# Patient Record
Sex: Male | Born: 1955 | Race: White | Hispanic: No | Smoking: Former smoker
Health system: Southern US, Community
[De-identification: ages and names within clinical notes are randomized; demographics above are authoritative.]

## PROBLEM LIST (undated history)

## (undated) DIAGNOSIS — I10 Essential (primary) hypertension: Secondary | ICD-10-CM

## (undated) DIAGNOSIS — R161 Splenomegaly, not elsewhere classified: Secondary | ICD-10-CM

## (undated) DIAGNOSIS — Z7969 Long term (current) use of other immunomodulators and immunosuppressants: Secondary | ICD-10-CM

## (undated) DIAGNOSIS — C7931 Secondary malignant neoplasm of brain: Principal | ICD-10-CM

## (undated) DIAGNOSIS — F1021 Alcohol dependence, in remission: Secondary | ICD-10-CM

## (undated) DIAGNOSIS — E785 Hyperlipidemia, unspecified: Secondary | ICD-10-CM

## (undated) DIAGNOSIS — F17201 Nicotine dependence, unspecified, in remission: Secondary | ICD-10-CM

## (undated) DIAGNOSIS — I2510 Atherosclerotic heart disease of native coronary artery without angina pectoris: Secondary | ICD-10-CM

## (undated) DIAGNOSIS — Z79899 Other long term (current) drug therapy: Secondary | ICD-10-CM

## (undated) DIAGNOSIS — C20 Malignant neoplasm of rectum: Secondary | ICD-10-CM

## (undated) DIAGNOSIS — Z9221 Personal history of antineoplastic chemotherapy: Secondary | ICD-10-CM

## (undated) DIAGNOSIS — Z923 Personal history of irradiation: Secondary | ICD-10-CM

## (undated) HISTORY — DX: Nicotine dependence, unspecified, in remission: F17.201

## (undated) HISTORY — PX: COLOSTOMY: SHX63

## (undated) HISTORY — DX: Hyperlipidemia, unspecified: E78.5

## (undated) HISTORY — DX: Atherosclerotic heart disease of native coronary artery without angina pectoris: I25.10

## (undated) HISTORY — PX: IRRIGATION AND DEBRIDEMENT SEBACEOUS CYST: SHX5255

## (undated) HISTORY — DX: Malignant neoplasm of rectum: C20

## (undated) HISTORY — DX: Alcohol dependence, in remission: F10.21

## (undated) HISTORY — DX: Secondary malignant neoplasm of brain: C79.31

## (undated) HISTORY — DX: Splenomegaly, not elsewhere classified: R16.1

---

## 2006-03-22 DIAGNOSIS — C20 Malignant neoplasm of rectum: Secondary | ICD-10-CM

## 2006-03-22 HISTORY — DX: Malignant neoplasm of rectum: C20

## 2006-03-22 HISTORY — PX: ABDOMINOPERINEAL PROCTOCOLECTOMY: SUR8

## 2006-04-25 ENCOUNTER — Ambulatory Visit: Payer: Self-pay | Admitting: Internal Medicine

## 2006-04-28 ENCOUNTER — Ambulatory Visit (HOSPITAL_COMMUNITY): Admission: RE | Admit: 2006-04-28 | Discharge: 2006-04-28 | Payer: Self-pay | Admitting: Internal Medicine

## 2006-04-28 ENCOUNTER — Ambulatory Visit: Payer: Self-pay | Admitting: Internal Medicine

## 2006-04-28 ENCOUNTER — Encounter (INDEPENDENT_AMBULATORY_CARE_PROVIDER_SITE_OTHER): Payer: Self-pay | Admitting: Specialist

## 2006-04-29 ENCOUNTER — Ambulatory Visit (HOSPITAL_COMMUNITY): Admission: RE | Admit: 2006-04-29 | Discharge: 2006-04-29 | Payer: Self-pay | Admitting: Internal Medicine

## 2006-05-09 ENCOUNTER — Inpatient Hospital Stay (HOSPITAL_COMMUNITY): Admission: RE | Admit: 2006-05-09 | Discharge: 2006-05-17 | Payer: Self-pay | Admitting: General Surgery

## 2006-05-09 ENCOUNTER — Encounter (INDEPENDENT_AMBULATORY_CARE_PROVIDER_SITE_OTHER): Payer: Self-pay | Admitting: Specialist

## 2006-06-06 ENCOUNTER — Encounter (HOSPITAL_COMMUNITY): Admission: RE | Admit: 2006-06-06 | Discharge: 2006-07-06 | Payer: Self-pay | Admitting: Oncology

## 2006-06-06 ENCOUNTER — Ambulatory Visit (HOSPITAL_COMMUNITY): Payer: Self-pay | Admitting: Oncology

## 2006-06-10 ENCOUNTER — Ambulatory Visit (HOSPITAL_COMMUNITY): Admission: RE | Admit: 2006-06-10 | Discharge: 2006-06-10 | Payer: Self-pay | Admitting: General Surgery

## 2006-07-10 ENCOUNTER — Emergency Department (HOSPITAL_COMMUNITY): Admission: EM | Admit: 2006-07-10 | Discharge: 2006-07-10 | Payer: Self-pay | Admitting: Emergency Medicine

## 2006-07-15 ENCOUNTER — Ambulatory Visit: Admission: RE | Admit: 2006-07-15 | Discharge: 2006-10-13 | Payer: Self-pay | Admitting: *Deleted

## 2006-07-20 ENCOUNTER — Encounter (HOSPITAL_COMMUNITY): Admission: RE | Admit: 2006-07-20 | Discharge: 2006-08-19 | Payer: Self-pay | Admitting: Oncology

## 2006-07-27 ENCOUNTER — Ambulatory Visit (HOSPITAL_COMMUNITY): Payer: Self-pay | Admitting: Oncology

## 2006-08-17 ENCOUNTER — Ambulatory Visit (HOSPITAL_COMMUNITY): Admission: RE | Admit: 2006-08-17 | Discharge: 2006-08-17 | Payer: Self-pay | Admitting: General Surgery

## 2006-08-23 ENCOUNTER — Encounter (HOSPITAL_COMMUNITY): Admission: RE | Admit: 2006-08-23 | Discharge: 2006-09-22 | Payer: Self-pay | Admitting: Oncology

## 2006-09-15 ENCOUNTER — Ambulatory Visit (HOSPITAL_COMMUNITY): Payer: Self-pay | Admitting: Oncology

## 2006-09-28 ENCOUNTER — Encounter (HOSPITAL_COMMUNITY): Admission: RE | Admit: 2006-09-28 | Discharge: 2006-10-28 | Payer: Self-pay | Admitting: Oncology

## 2006-10-25 ENCOUNTER — Ambulatory Visit: Admission: RE | Admit: 2006-10-25 | Discharge: 2006-12-16 | Payer: Self-pay | Admitting: *Deleted

## 2006-11-01 ENCOUNTER — Ambulatory Visit (HOSPITAL_COMMUNITY): Payer: Self-pay | Admitting: Oncology

## 2006-11-01 ENCOUNTER — Encounter (HOSPITAL_COMMUNITY): Admission: RE | Admit: 2006-11-01 | Discharge: 2006-12-01 | Payer: Self-pay | Admitting: Oncology

## 2006-12-07 ENCOUNTER — Encounter (HOSPITAL_COMMUNITY): Admission: RE | Admit: 2006-12-07 | Discharge: 2006-12-20 | Payer: Self-pay | Admitting: Oncology

## 2006-12-27 ENCOUNTER — Ambulatory Visit (HOSPITAL_COMMUNITY): Payer: Self-pay | Admitting: Oncology

## 2006-12-27 ENCOUNTER — Encounter (HOSPITAL_COMMUNITY): Admission: RE | Admit: 2006-12-27 | Discharge: 2007-01-26 | Payer: Self-pay | Admitting: Oncology

## 2007-02-27 ENCOUNTER — Ambulatory Visit (HOSPITAL_COMMUNITY): Payer: Self-pay | Admitting: Oncology

## 2007-03-23 HISTORY — PX: LAPAROSCOPIC LYSIS INTESTINAL ADHESIONS: SUR778

## 2007-03-30 ENCOUNTER — Encounter (HOSPITAL_COMMUNITY): Admission: RE | Admit: 2007-03-30 | Discharge: 2007-04-29 | Payer: Self-pay | Admitting: Oncology

## 2007-04-29 ENCOUNTER — Inpatient Hospital Stay (HOSPITAL_COMMUNITY): Admission: EM | Admit: 2007-04-29 | Discharge: 2007-05-24 | Payer: Self-pay | Admitting: Emergency Medicine

## 2007-05-08 ENCOUNTER — Encounter (INDEPENDENT_AMBULATORY_CARE_PROVIDER_SITE_OTHER): Payer: Self-pay | Admitting: General Surgery

## 2007-05-18 ENCOUNTER — Encounter: Payer: Self-pay | Admitting: General Surgery

## 2007-07-05 ENCOUNTER — Ambulatory Visit (HOSPITAL_COMMUNITY): Payer: Self-pay | Admitting: Oncology

## 2007-07-05 ENCOUNTER — Encounter (HOSPITAL_COMMUNITY): Admission: RE | Admit: 2007-07-05 | Discharge: 2007-08-04 | Payer: Self-pay | Admitting: Oncology

## 2007-07-27 ENCOUNTER — Ambulatory Visit (HOSPITAL_COMMUNITY): Admission: RE | Admit: 2007-07-27 | Discharge: 2007-07-27 | Payer: Self-pay | Admitting: Oncology

## 2007-09-27 ENCOUNTER — Encounter (HOSPITAL_COMMUNITY): Admission: RE | Admit: 2007-09-27 | Discharge: 2007-10-27 | Payer: Self-pay | Admitting: Oncology

## 2007-09-27 ENCOUNTER — Ambulatory Visit (HOSPITAL_COMMUNITY): Payer: Self-pay | Admitting: Oncology

## 2007-11-21 ENCOUNTER — Encounter (HOSPITAL_COMMUNITY): Admission: RE | Admit: 2007-11-21 | Discharge: 2007-12-18 | Payer: Self-pay | Admitting: Oncology

## 2007-12-11 ENCOUNTER — Ambulatory Visit (HOSPITAL_COMMUNITY): Payer: Self-pay | Admitting: Oncology

## 2008-03-04 ENCOUNTER — Encounter (HOSPITAL_COMMUNITY): Admission: RE | Admit: 2008-03-04 | Discharge: 2008-04-03 | Payer: Self-pay | Admitting: Oncology

## 2008-03-04 ENCOUNTER — Ambulatory Visit (HOSPITAL_COMMUNITY): Payer: Self-pay | Admitting: Oncology

## 2008-05-27 ENCOUNTER — Encounter (HOSPITAL_COMMUNITY): Admission: RE | Admit: 2008-05-27 | Discharge: 2008-06-26 | Payer: Self-pay | Admitting: Oncology

## 2008-05-27 ENCOUNTER — Ambulatory Visit (HOSPITAL_COMMUNITY): Payer: Self-pay | Admitting: Oncology

## 2008-06-19 ENCOUNTER — Ambulatory Visit (HOSPITAL_COMMUNITY): Admission: RE | Admit: 2008-06-19 | Discharge: 2008-06-19 | Payer: Self-pay | Admitting: General Surgery

## 2008-06-27 ENCOUNTER — Encounter: Payer: Self-pay | Admitting: Internal Medicine

## 2008-08-20 ENCOUNTER — Ambulatory Visit (HOSPITAL_COMMUNITY): Payer: Self-pay | Admitting: Oncology

## 2008-08-20 ENCOUNTER — Encounter (HOSPITAL_COMMUNITY): Admission: RE | Admit: 2008-08-20 | Discharge: 2008-09-19 | Payer: Self-pay | Admitting: Oncology

## 2008-11-12 ENCOUNTER — Encounter (HOSPITAL_COMMUNITY): Admission: RE | Admit: 2008-11-12 | Discharge: 2008-12-12 | Payer: Self-pay | Admitting: Oncology

## 2008-11-12 ENCOUNTER — Ambulatory Visit (HOSPITAL_COMMUNITY): Payer: Self-pay | Admitting: Oncology

## 2008-12-11 ENCOUNTER — Encounter (INDEPENDENT_AMBULATORY_CARE_PROVIDER_SITE_OTHER): Payer: Self-pay | Admitting: *Deleted

## 2009-01-22 DIAGNOSIS — I10 Essential (primary) hypertension: Secondary | ICD-10-CM | POA: Insufficient documentation

## 2009-01-23 ENCOUNTER — Ambulatory Visit: Payer: Self-pay | Admitting: Internal Medicine

## 2009-01-23 DIAGNOSIS — R161 Splenomegaly, not elsewhere classified: Secondary | ICD-10-CM

## 2009-01-23 DIAGNOSIS — F1021 Alcohol dependence, in remission: Secondary | ICD-10-CM

## 2009-01-23 DIAGNOSIS — J4489 Other specified chronic obstructive pulmonary disease: Secondary | ICD-10-CM | POA: Insufficient documentation

## 2009-01-23 DIAGNOSIS — J449 Chronic obstructive pulmonary disease, unspecified: Secondary | ICD-10-CM

## 2009-01-23 DIAGNOSIS — Z8711 Personal history of peptic ulcer disease: Secondary | ICD-10-CM

## 2009-01-23 DIAGNOSIS — Z872 Personal history of diseases of the skin and subcutaneous tissue: Secondary | ICD-10-CM | POA: Insufficient documentation

## 2009-01-23 DIAGNOSIS — Z8719 Personal history of other diseases of the digestive system: Secondary | ICD-10-CM

## 2009-01-23 HISTORY — DX: Alcohol dependence, in remission: F10.21

## 2009-01-23 HISTORY — DX: Splenomegaly, not elsewhere classified: R16.1

## 2009-01-31 ENCOUNTER — Encounter: Payer: Self-pay | Admitting: Internal Medicine

## 2009-02-04 ENCOUNTER — Ambulatory Visit (HOSPITAL_COMMUNITY): Payer: Self-pay | Admitting: Oncology

## 2009-02-04 ENCOUNTER — Encounter (HOSPITAL_COMMUNITY): Admission: RE | Admit: 2009-02-04 | Discharge: 2009-03-06 | Payer: Self-pay | Admitting: Oncology

## 2009-02-11 ENCOUNTER — Ambulatory Visit (HOSPITAL_COMMUNITY): Admission: RE | Admit: 2009-02-11 | Discharge: 2009-02-11 | Payer: Self-pay | Admitting: Internal Medicine

## 2009-02-11 ENCOUNTER — Ambulatory Visit: Payer: Self-pay | Admitting: Internal Medicine

## 2009-02-18 ENCOUNTER — Encounter: Payer: Self-pay | Admitting: Internal Medicine

## 2009-03-22 DIAGNOSIS — I2510 Atherosclerotic heart disease of native coronary artery without angina pectoris: Secondary | ICD-10-CM

## 2009-03-22 HISTORY — PX: CORONARY ARTERY BYPASS GRAFT: SHX141

## 2009-03-22 HISTORY — DX: Atherosclerotic heart disease of native coronary artery without angina pectoris: I25.10

## 2009-03-22 HISTORY — PX: PORTACATH PLACEMENT: SHX2246

## 2009-04-02 IMAGING — CT CT PELVIS W/ CM
1 of 3 series · 14 of 32 positions shown, 19 images · IV contrast (Omnipaque 300)
Comparison: 09/19/2006

ABDOMEN CT WITH CONTRAST

CLINICAL DATA: Colon cancer, severe abdominal pain, nausea, vomiting
TECHNIQUE: Multidetector CT imaging of the abdomen and pelvis was performed
following the standard protocol during bolus administration of intravenous
contrast.

Contrast:  100 cc Omnipaque 300

[Series 2: abd_pel 5.0 b40f · axial · 0.72mm/px · z∈[+602,+1032]mm · 14 of 98 slices shown, 19 images]
[im 6/98  soft-tissue]
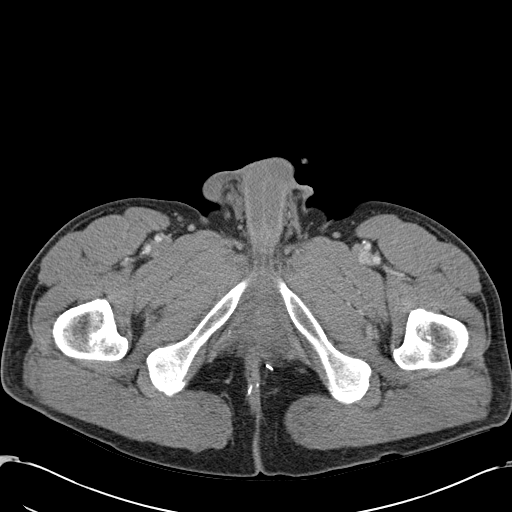
[im 6/98  bone]
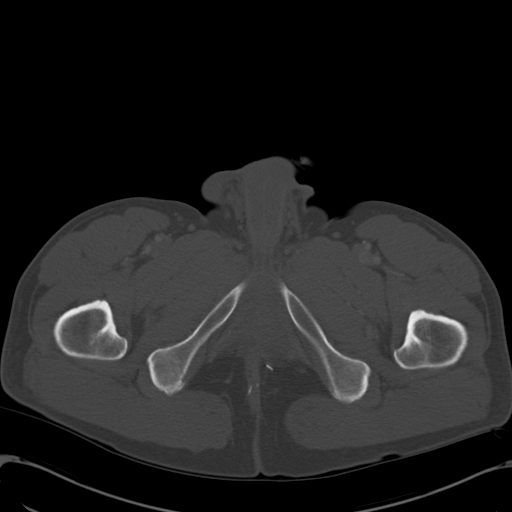
[im 12/98  soft-tissue]
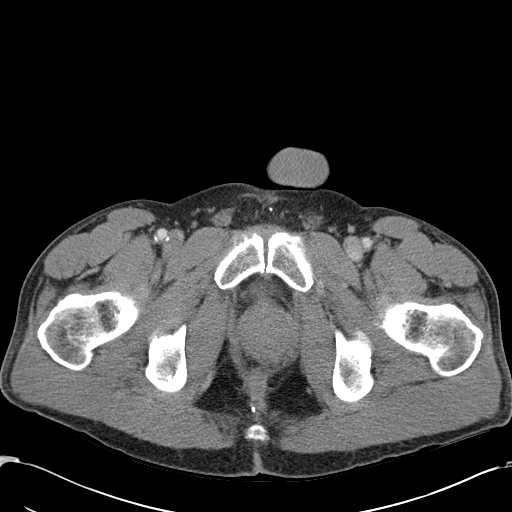
[im 23/98  soft-tissue]
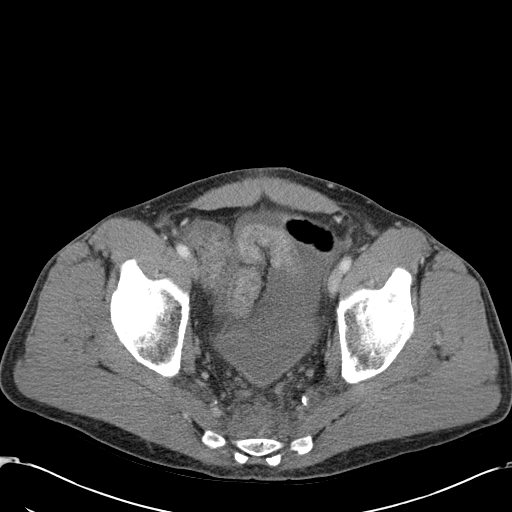
[im 29/98  soft-tissue]
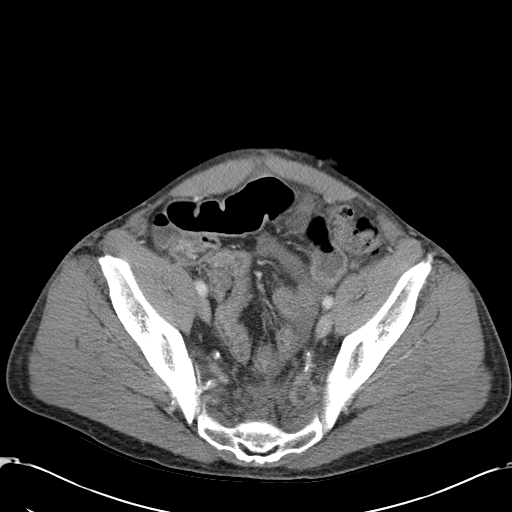
[im 35/98  soft-tissue]
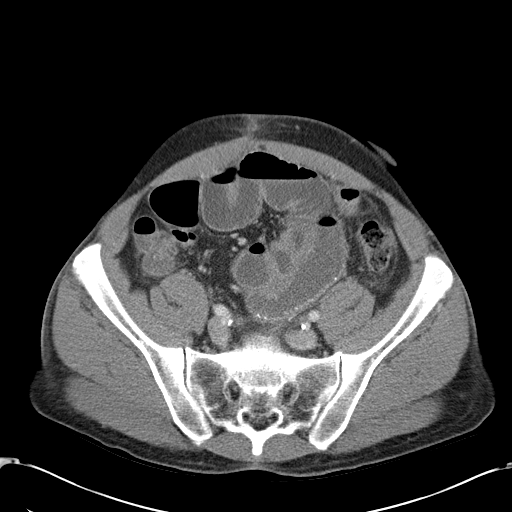
[im 40/98  soft-tissue]
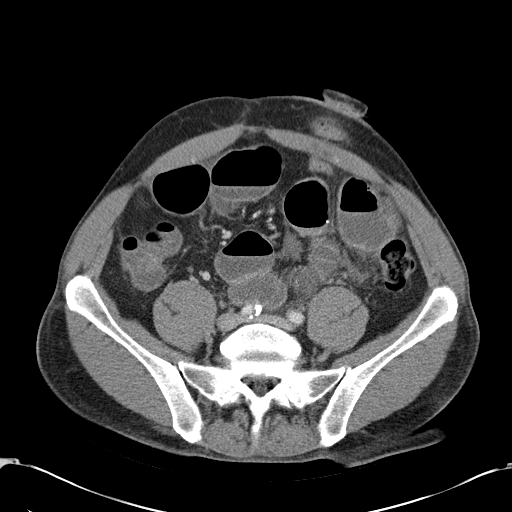
[im 52/98  soft-tissue]
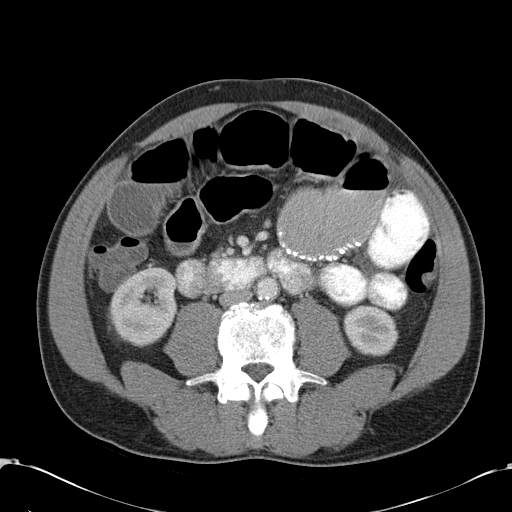
[im 58/98  soft-tissue]
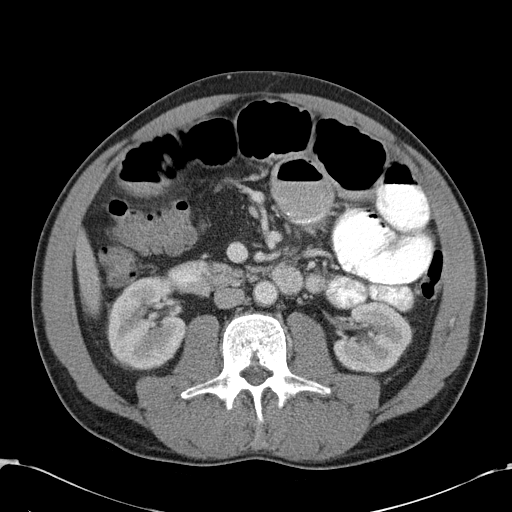
[im 63/98  soft-tissue]
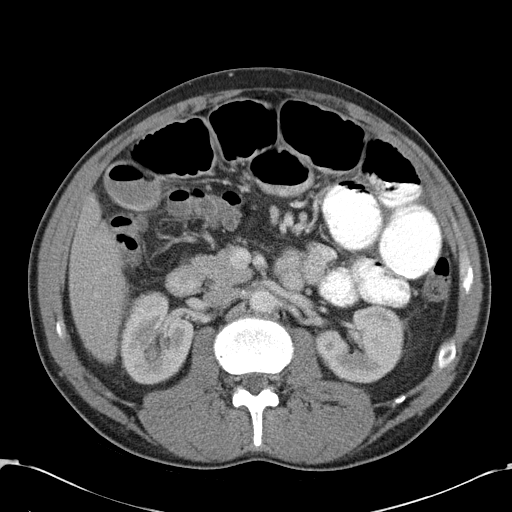
[im 63/98  bone]
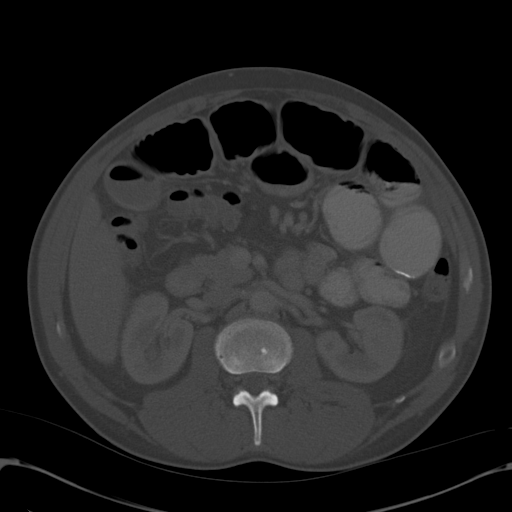
[im 69/98  soft-tissue]
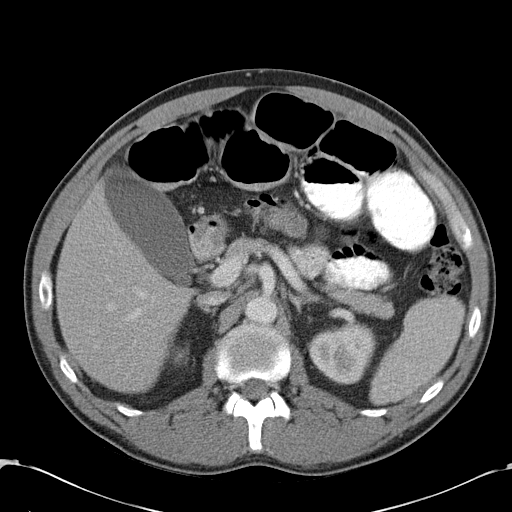
[im 75/98  soft-tissue]
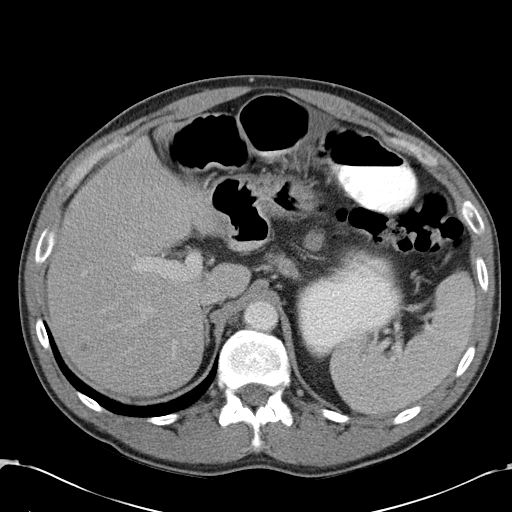
[im 75/98  lung]
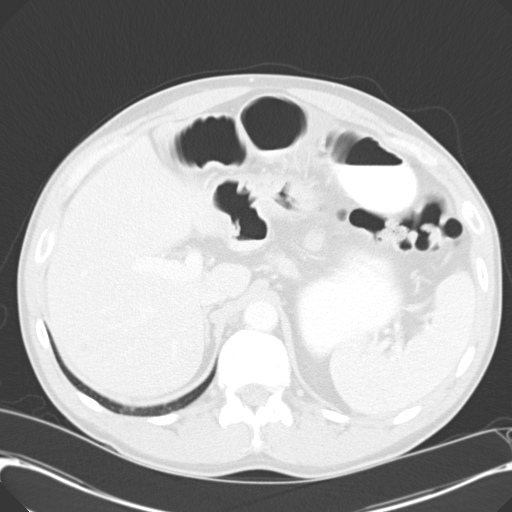
[im 80/98  lung]
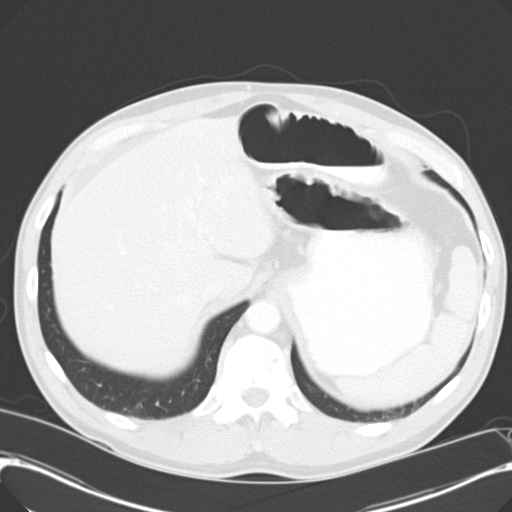
[im 86/98  soft-tissue]
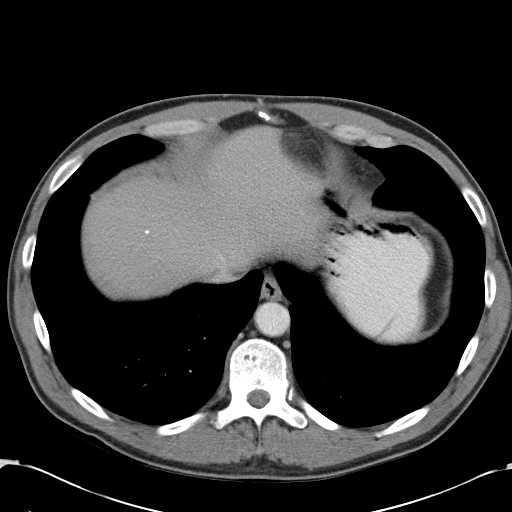
[im 86/98  lung]
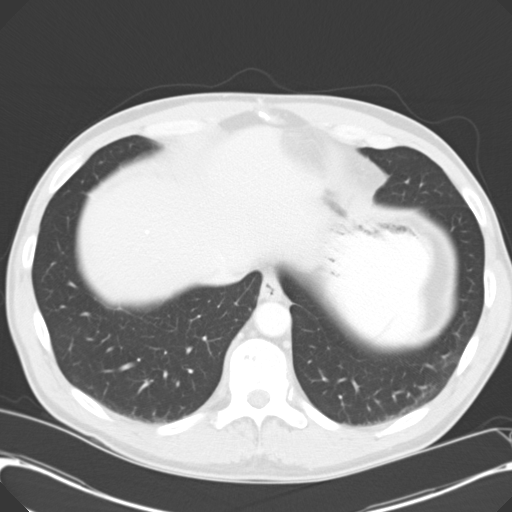
[im 92/98  soft-tissue]
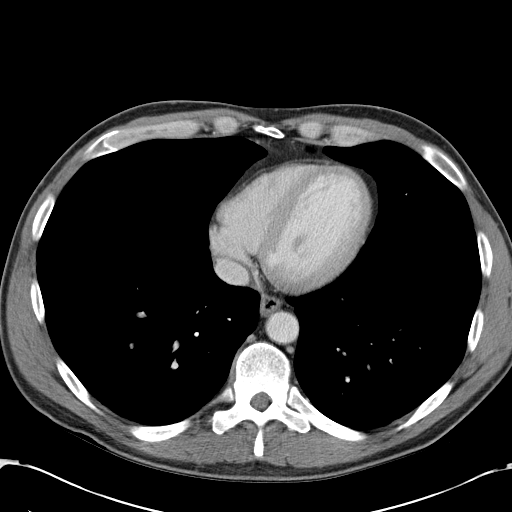
[im 92/98  lung]
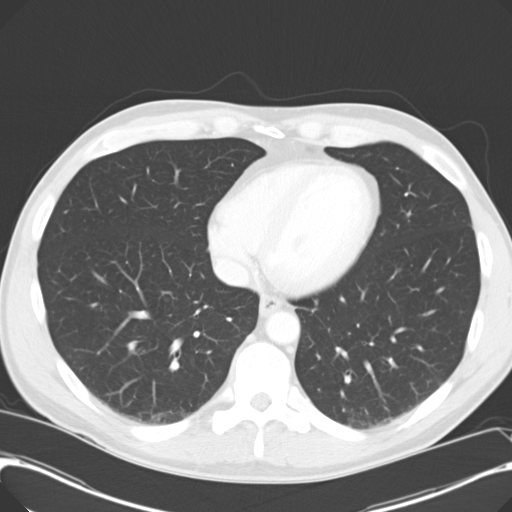

[14 of 32 positions shown; findings below may reference images not displayed]

FINDINGS: Tiny low density lesions previously seen in the liver are unchanged,
most compatible with cysts. No new liver lesions. Spleen, pancreas, adrenals,
kidneys unremarkable. Gallbladder grossly unremarkable. No biliary ductal
dilatation.

There are dilated small bowel loops continuing into the pelvis. Pattern
compatible with small bowel obstruction.

There is a small amount of free fluid around the liver. No adenopathy.

There is a small nodule in the left lower lobe posteriorly, measuring 4 mm on
image 5. This area was not definitively imaged on prior CT abdomen. Otherwise
lung bases clear. Heart is normal size. No effusions. No acute bony abnormality.

IMPRESSION

Dilated small bowel loops with air-fluid levels, compatible with small bowel
obstruction.

Stable small low-density lesions in the liver, likely cysts. 

4 mm left lower lobe nodule, not definitively imaged on prior study. Recommend
followup chest CT in 3-6 months.

PELVIS CT WITH CONTRAST
FINDINGS: Small amount of free fluid in the pelvis. Small bowel loops are
dilated into the pelvis. Distal small bowel loops are decompressed and appears
thickened. Concerning for enteritis, likely infectious or inflammatory.
Recommend correlation for radiation to this area recently.

Left lower quadrant ostomy is present, unremarkable. No adenopathy. No acute
bony abnormality.

IMPRESSION

Small bowel obstructive pattern. Distal small bowel is decompressed but appears
thickwalled, concerning for infectious or inflammatory enteritis. This could be
related to radiation if this area has been recently irradiated . Recommend
clinical correlation.

Small amount of free fluid.

## 2009-05-06 ENCOUNTER — Ambulatory Visit (HOSPITAL_COMMUNITY): Payer: Self-pay | Admitting: Oncology

## 2009-05-06 ENCOUNTER — Encounter (HOSPITAL_COMMUNITY): Admission: RE | Admit: 2009-05-06 | Discharge: 2009-06-05 | Payer: Self-pay | Admitting: Oncology

## 2009-06-06 ENCOUNTER — Encounter (INDEPENDENT_AMBULATORY_CARE_PROVIDER_SITE_OTHER): Payer: Self-pay | Admitting: *Deleted

## 2009-06-06 ENCOUNTER — Ambulatory Visit: Payer: Self-pay | Admitting: Cardiology

## 2009-06-06 ENCOUNTER — Ambulatory Visit (HOSPITAL_COMMUNITY): Admission: RE | Admit: 2009-06-06 | Discharge: 2009-06-06 | Payer: Self-pay | Admitting: Cardiology

## 2009-06-06 ENCOUNTER — Encounter: Payer: Self-pay | Admitting: Adult Health

## 2009-06-06 LAB — CONVERTED CEMR LAB
Basophils Relative: 1 % (ref 0–1)
CO2: 24 meq/L (ref 19–32)
Calcium: 9.8 mg/dL
Calcium: 9.8 mg/dL (ref 8.4–10.5)
Chloride: 106 meq/L
Creatinine, Ser: 1.25 mg/dL
Eosinophils Absolute: 0.3 10*3/uL (ref 0.0–0.7)
Glucose, Bld: 89 mg/dL (ref 70–99)
MCHC: 33.7 g/dL (ref 30.0–36.0)
MCV: 90.1 fL
MCV: 90.1 fL (ref 78.0–100.0)
Neutrophils Relative %: 70 % (ref 43–77)
Platelets: 264 10*3/uL
Platelets: 264 10*3/uL (ref 150–400)
RDW: 13.7 % (ref 11.5–15.5)
Sodium: 140 meq/L (ref 135–145)
WBC: 9.6 10*3/uL

## 2009-06-09 ENCOUNTER — Ambulatory Visit (HOSPITAL_COMMUNITY): Admission: RE | Admit: 2009-06-09 | Discharge: 2009-06-09 | Payer: Self-pay | Admitting: Oncology

## 2009-06-10 ENCOUNTER — Ambulatory Visit: Payer: Self-pay | Admitting: Cardiology

## 2009-06-10 ENCOUNTER — Inpatient Hospital Stay (HOSPITAL_COMMUNITY): Admission: AD | Admit: 2009-06-10 | Discharge: 2009-06-16 | Payer: Self-pay | Admitting: Cardiology

## 2009-06-10 ENCOUNTER — Inpatient Hospital Stay (HOSPITAL_BASED_OUTPATIENT_CLINIC_OR_DEPARTMENT_OTHER): Admission: RE | Admit: 2009-06-10 | Discharge: 2009-06-10 | Payer: Self-pay | Admitting: Cardiology

## 2009-06-10 ENCOUNTER — Ambulatory Visit: Payer: Self-pay | Admitting: Thoracic Surgery (Cardiothoracic Vascular Surgery)

## 2009-06-10 ENCOUNTER — Encounter: Payer: Self-pay | Admitting: Cardiology

## 2009-06-17 ENCOUNTER — Encounter: Payer: Self-pay | Admitting: Cardiology

## 2009-06-23 ENCOUNTER — Encounter: Payer: Self-pay | Admitting: Internal Medicine

## 2009-06-30 ENCOUNTER — Ambulatory Visit (HOSPITAL_COMMUNITY): Payer: Self-pay | Admitting: Oncology

## 2009-06-30 ENCOUNTER — Encounter (HOSPITAL_COMMUNITY): Admission: RE | Admit: 2009-06-30 | Discharge: 2009-07-30 | Payer: Self-pay | Admitting: Oncology

## 2009-07-01 ENCOUNTER — Ambulatory Visit: Payer: Self-pay | Admitting: Thoracic Surgery (Cardiothoracic Vascular Surgery)

## 2009-07-01 ENCOUNTER — Encounter
Admission: RE | Admit: 2009-07-01 | Discharge: 2009-07-01 | Payer: Self-pay | Admitting: Thoracic Surgery (Cardiothoracic Vascular Surgery)

## 2009-07-14 ENCOUNTER — Ambulatory Visit (HOSPITAL_COMMUNITY): Admission: RE | Admit: 2009-07-14 | Discharge: 2009-07-14 | Payer: Self-pay | Admitting: General Surgery

## 2009-07-29 ENCOUNTER — Encounter: Payer: Self-pay | Admitting: Cardiology

## 2009-07-30 ENCOUNTER — Encounter: Payer: Self-pay | Admitting: Internal Medicine

## 2009-08-12 ENCOUNTER — Encounter (HOSPITAL_COMMUNITY): Admission: RE | Admit: 2009-08-12 | Discharge: 2009-09-11 | Payer: Self-pay | Admitting: Oncology

## 2009-08-12 ENCOUNTER — Encounter (INDEPENDENT_AMBULATORY_CARE_PROVIDER_SITE_OTHER): Payer: Self-pay | Admitting: *Deleted

## 2009-08-12 LAB — CONVERTED CEMR LAB
HCT: 38.4 %
Hemoglobin: 13.6 g/dL
WBC: 6.7 10*3/uL

## 2009-08-14 ENCOUNTER — Ambulatory Visit (HOSPITAL_COMMUNITY): Payer: Self-pay | Admitting: Oncology

## 2009-08-22 ENCOUNTER — Telehealth (INDEPENDENT_AMBULATORY_CARE_PROVIDER_SITE_OTHER): Payer: Self-pay

## 2009-08-25 ENCOUNTER — Encounter: Payer: Self-pay | Admitting: Internal Medicine

## 2009-08-25 ENCOUNTER — Encounter: Payer: Self-pay | Admitting: Cardiology

## 2009-08-26 ENCOUNTER — Encounter (INDEPENDENT_AMBULATORY_CARE_PROVIDER_SITE_OTHER): Payer: Self-pay | Admitting: *Deleted

## 2009-09-02 ENCOUNTER — Encounter (INDEPENDENT_AMBULATORY_CARE_PROVIDER_SITE_OTHER): Payer: Self-pay | Admitting: *Deleted

## 2009-09-02 ENCOUNTER — Ambulatory Visit: Payer: Self-pay | Admitting: Cardiology

## 2009-09-02 ENCOUNTER — Encounter (INDEPENDENT_AMBULATORY_CARE_PROVIDER_SITE_OTHER): Payer: Self-pay

## 2009-09-02 DIAGNOSIS — F17201 Nicotine dependence, unspecified, in remission: Secondary | ICD-10-CM | POA: Insufficient documentation

## 2009-09-02 HISTORY — DX: Nicotine dependence, unspecified, in remission: F17.201

## 2009-09-23 ENCOUNTER — Encounter (HOSPITAL_COMMUNITY): Admission: RE | Admit: 2009-09-23 | Discharge: 2009-10-23 | Payer: Self-pay | Admitting: Oncology

## 2009-10-01 ENCOUNTER — Ambulatory Visit (HOSPITAL_COMMUNITY): Payer: Self-pay | Admitting: Oncology

## 2009-10-28 ENCOUNTER — Encounter: Payer: Self-pay | Admitting: Cardiology

## 2009-11-03 ENCOUNTER — Encounter (HOSPITAL_COMMUNITY): Admission: RE | Admit: 2009-11-03 | Discharge: 2009-12-03 | Payer: Self-pay | Admitting: Oncology

## 2009-11-03 ENCOUNTER — Encounter (INDEPENDENT_AMBULATORY_CARE_PROVIDER_SITE_OTHER): Payer: Self-pay | Admitting: *Deleted

## 2009-11-03 LAB — CONVERTED CEMR LAB
ALT: 24 units/L
Albumin: 4.8 g/dL
Basophils Relative: 1 %
CO2: 21 meq/L
Calcium: 10 mg/dL
Chloride: 106 meq/L
Creatinine, Ser: 1.39 mg/dL
Eosinophils Absolute: 0.3 10*3/uL
HCT: 47 %
HDL: 39 mg/dL
Hemoglobin: 16 g/dL
Lymphs Abs: 1.7 10*3/uL
Monocytes Absolute: 0.8 10*3/uL
Monocytes Relative: 10 %
Platelets: 171 10*3/uL
Potassium: 4.7 meq/L
Total Protein: 8 g/dL
Triglycerides: 477 mg/dL
WBC: 8.1 10*3/uL

## 2009-11-04 ENCOUNTER — Encounter (INDEPENDENT_AMBULATORY_CARE_PROVIDER_SITE_OTHER): Payer: Self-pay | Admitting: *Deleted

## 2009-11-06 ENCOUNTER — Encounter (INDEPENDENT_AMBULATORY_CARE_PROVIDER_SITE_OTHER): Payer: Self-pay | Admitting: *Deleted

## 2009-11-06 DIAGNOSIS — E785 Hyperlipidemia, unspecified: Secondary | ICD-10-CM | POA: Insufficient documentation

## 2009-11-06 LAB — CONVERTED CEMR LAB
AST: 23 units/L (ref 0–37)
Albumin: 4.8 g/dL (ref 3.5–5.2)
Alkaline Phosphatase: 111 units/L (ref 39–117)
Basophils Absolute: 0.1 10*3/uL (ref 0.0–0.1)
Basophils Relative: 1 % (ref 0–1)
Eosinophils Absolute: 0.3 10*3/uL (ref 0.0–0.7)
MCHC: 34 g/dL (ref 30.0–36.0)
MCV: 96.5 fL (ref 78.0–100.0)
Neutrophils Relative %: 65 % (ref 43–77)
Platelets: 171 10*3/uL (ref 150–400)
Potassium: 4.7 meq/L (ref 3.5–5.3)
RDW: 18.6 % — ABNORMAL HIGH (ref 11.5–15.5)
Sodium: 142 meq/L (ref 135–145)
Total Bilirubin: 0.6 mg/dL (ref 0.3–1.2)
Total Protein: 8 g/dL (ref 6.0–8.3)
WBC: 8.1 10*3/uL (ref 4.0–10.5)

## 2009-11-18 ENCOUNTER — Encounter: Payer: Self-pay | Admitting: Internal Medicine

## 2009-11-21 ENCOUNTER — Encounter: Payer: Self-pay | Admitting: Internal Medicine

## 2009-11-25 ENCOUNTER — Ambulatory Visit (HOSPITAL_COMMUNITY): Payer: Self-pay | Admitting: Oncology

## 2009-12-01 ENCOUNTER — Encounter: Payer: Self-pay | Admitting: Cardiology

## 2009-12-09 ENCOUNTER — Encounter (HOSPITAL_COMMUNITY)
Admission: RE | Admit: 2009-12-09 | Discharge: 2009-12-19 | Payer: Self-pay | Source: Home / Self Care | Admitting: Oncology

## 2009-12-22 ENCOUNTER — Encounter (HOSPITAL_COMMUNITY)
Admission: RE | Admit: 2009-12-22 | Discharge: 2010-01-21 | Payer: Self-pay | Source: Home / Self Care | Admitting: Oncology

## 2010-01-19 ENCOUNTER — Ambulatory Visit (HOSPITAL_COMMUNITY): Payer: Self-pay | Admitting: Oncology

## 2010-02-23 ENCOUNTER — Encounter (HOSPITAL_COMMUNITY)
Admission: RE | Admit: 2010-02-23 | Discharge: 2010-03-25 | Payer: Self-pay | Source: Home / Self Care | Attending: Oncology | Admitting: Oncology

## 2010-04-02 ENCOUNTER — Encounter: Payer: Self-pay | Admitting: Internal Medicine

## 2010-04-09 ENCOUNTER — Encounter: Payer: Self-pay | Admitting: Cardiology

## 2010-04-09 ENCOUNTER — Other Ambulatory Visit (HOSPITAL_COMMUNITY): Payer: Self-pay | Admitting: Oncology

## 2010-04-09 ENCOUNTER — Ambulatory Visit (HOSPITAL_COMMUNITY): Admit: 2010-04-09 | Payer: Self-pay | Admitting: Oncology

## 2010-04-09 DIAGNOSIS — Z85038 Personal history of other malignant neoplasm of large intestine: Secondary | ICD-10-CM

## 2010-04-12 ENCOUNTER — Encounter (HOSPITAL_COMMUNITY): Payer: Self-pay | Admitting: Oncology

## 2010-04-21 NOTE — Letter (Signed)
Summary: [facility], [facility] at [employer]  [address] [place] [address]   Phone: [phone]  Fax: [phone]     November 06, 2009 MRN: [id number]   [facility] [place] [address] [place], [place]  [address]      YOUR LAB WORK IS DUE   December 08, 2009  Please go to [facility], located across the street from [facility] on the second floor.  Hours are Monday - Friday 7am until 7:30pm         Saturday 8am until 12noon    _X_  DO NOT EAT OR DRINK AFTER MIDNIGHT EVENING PRIOR TO LABWORK  __ YOUR LABWORK IS NOT FASTING --YOU MAY EAT PRIOR TO LABWORK

## 2010-04-21 NOTE — Letter (Signed)
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

## 2010-04-21 NOTE — Letter (Signed)
Summary: [facility]   Imported By: Diana Eves 06/23/2009 16:23:14  _____________________________________________________________________  External Attachment:    Type:   Image     Comment:   External Document

## 2010-04-21 NOTE — Miscellaneous (Signed)
Summary: CARDIAC CATH 3/22 /2011  Clinical Lists Changes  Observations: Added new observation of CARDCATHFIND:  INDICATIONS:  Evaluate the patient with new-onset exertional chest pain   (unstable angina).  The patient does have a history of colon cancer   resected in the past.  He has new lung nodules, which are currently   being evaluated.      PROCEDURE NOTE:  Left heart catheterization was performed via right   femoral artery.  The artery was cannulated using anterior wall puncture.   A #4-French arterial sheath was inserted via the modified Seldinger   technique.  Preformed Judkins and a pigtail catheter were utilized.  The   patient tolerated the procedure well and left the lab in stable   condition.  He did have some chest discomfort after injection of the   left system and was placed on IV nitroglycerin and sublingual   nitroglycerin was given prior to leaving the lab.      RESULTS:  Hemodynamics:  LV 166/16, AO 145/80.      Coronaries:  On engaging the left main, there was initial damping of the   pressure waveform.  All shots were taken as flush injections with   adequate opacification.  He had an ostial long 99% left main stenosis.   LAD wrapped the apex.  There was proximal 25% stenosis.  There were mid   luminal irregularities.  There were small diagonals.  The circumflex in   the AV groove had luminal irregularities.  There was a small OM1, which   was normal.  Posterolateral was moderate size and normal.  Right   coronary artery:  The right coronary artery was dominant vessel.  There   was some proximal 25% stenosis.  It was otherwise normal throughout its   course.  The PDA was moderate size and normal.  Left ventriculogram:   Left ventriculogram was obtained in the RAO projection.  The EF was 65%   with normal wall motion.      CONCLUSION:  The patient has critical left main stenosis.  He has got   well-preserved ejection fraction.      PLAN:  The patient will be  taken to the OR today by Dr. Dorris Fetch.     (05/14/2009 10:10)      Cardiac Cath  Procedure date:  05/14/2009  Findings:       INDICATIONS:  Evaluate the patient with new-onset exertional chest pain   (unstable angina).  The patient does have a history of colon cancer   resected in the past.  He has new lung nodules, which are currently   being evaluated.      PROCEDURE NOTE:  Left heart catheterization was performed via right   femoral artery.  The artery was cannulated using anterior wall puncture.   A #4-French arterial sheath was inserted via the modified Seldinger   technique.  Preformed Judkins and a pigtail catheter were utilized.  The   patient tolerated the procedure well and left the lab in stable   condition.  He did have some chest discomfort after injection of the   left system and was placed on IV nitroglycerin and sublingual   nitroglycerin was given prior to leaving the lab.      RESULTS:  Hemodynamics:  LV 166/16, AO 145/80.      Coronaries:  On engaging the left main, there was initial damping of the   pressure waveform.  All shots were taken as flush injections with  adequate opacification.  He had an ostial long 99% left main stenosis.   LAD wrapped the apex.  There was proximal 25% stenosis.  There were mid   luminal irregularities.  There were small diagonals.  The circumflex in   the AV groove had luminal irregularities.  There was a small OM1, which   was normal.  Posterolateral was moderate size and normal.  Right   coronary artery:  The right coronary artery was dominant vessel.  There   was some proximal 25% stenosis.  It was otherwise normal throughout its   course.  The PDA was moderate size and normal.  Left ventriculogram:   Left ventriculogram was obtained in the RAO projection.  The EF was 65%   with normal wall motion.      CONCLUSION:  The patient has critical left main stenosis.  He has got   well-preserved ejection fraction.       PLAN:  The patient will be taken to the OR today by Dr. Dorris Fetch.

## 2010-04-21 NOTE — Letter (Signed)
Summary: [employer] Authorization Notification   Imported By: Roderic Ovens 08/20/2009 14:37:06  _____________________________________________________________________  External Attachment:    Type:   Image     Comment:   External Document

## 2010-04-21 NOTE — Assessment & Plan Note (Signed)
Summary: ROV   Referring Provider:  Onc.-Neijstrom Primary Provider:  Dr. Kirstie Peri   History of Present Illness: Mr. Macklin Jacquin returns to the office following uncomplicated CABG surgery.  Initially presented with exertional angina, promptly underwent cardiac catheterization and was found to have critical left main disease.  Bypass surgery was performed with a LIMA graft to the LAD and an SVG graft to the OM1 branch of the circumflex.  He was not referred to cardiac rehabilitation, but has been exercising on his own, mostly involving walking.  He has minimal residual soreness at the surgical site.  He notes no chest discomfort, no dyspnea, no orthopnea, no PND, no disturbance in sleep, no disturbance in cognition, no nonhealing incisions, no lightheadedness and no syncope.  Unfortunately, he has been found to have multiple lung nodules.  One of these was biopsied at the time of his cardiac surgery and was consistent with metastatic disease from his rectal carcinoma.  Chemotherapy is ongoing with significant adverse effects, most notably malaise and fatigue.  He has discontinued cigarette smoking since hospital discharge with the assistance of a non-FDA approved electric cigarette that delivers nicotine replacement therapy.    Current Medications (verified): 1)  Metoprolol Tartrate 50 Mg Tabs (Metoprolol Tartrate) .... Take 1 Tablet By Mouth Two Times A Day 2)  Simvastatin 40 Mg Tabs (Simvastatin) .... Take One Tablet By Mouth Daily At Bedtime 3)  Aspirin Ec 325 Mg Tbec (Aspirin) .... Take One Tablet By Mouth Daily 4)  Lisinopril 20 Mg Tabs (Lisinopril) .... Take One Tablet By Mouth Daily 5)  Multivitamins  Tabs (Multiple Vitamin) .Marland Kitchen.. 1 Tablet By Mouth Once Daily 6)  Lorazepam 1 Mg Tabs (Lorazepam) .Marland Kitchen.. 1 Tablet Q 3 Hrs. For Nausea  Allergies (verified): No Known Drug Allergies  Past History:  PMH, FH, and Social History reviewed and updated.  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  Review of Systems       See history of present illness.  Vital Signs:  Patient profile:   55 year old male Weight:      186 pounds O2 Sat:      97 % Pulse rate:   68 / minute BP sitting:   157 / 88  (left arm)  Vitals Entered ByLarita Fife Via LPN (September 02, 2009 1:37 PM)  Physical Exam  General:  Proportionate height and weight; well developed; no acute distress Weight-186, 7 pounds less than his preop value Neck-No JVD; no carotid bruits: Lungs-No tachypnea, no rales; no rhonchi; no wheezes Thorax: Barely visible well-healed median sternotomy incision; stable sternum Cardiovascular-normal PMI; normal S1 and S2; fourth heart sound present Abdomen-BS normal; soft and non-tender without masses or organomegaly:  Musculoskeletal-No deformities, no cyanosis or clubbing: Neurologic-Normal cranial nerves; symmetric strength and tone:  Skin-Warm, no significant lesions: Extremities-Nl distal pulses; no edema:      Impression & Recommendations:  Problem # 1:  TOBACCO ABUSE (ICD-305.1) Patient  is congratulated on discontinuing cigarette smoking and encouraged to continue with his current approach to this problem.  Problem # 2:  HYPERTENSION (ICD-401.9) Blood pressure control is slightly suboptimal at  this visit.  Patient will monitor blood pressures at home and report elevated values.  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  He will continue to increase exercise and a graded fashion and return to see me in 10 months.  To reduce pharmaceutical costs, rosuvastatin will be changed to simvastatin 40 mg q.d. with lipid profile and chemistry profile to be obtained in 2 months.  Other Orders: Future Orders: T-Lipid Profile ([id number]) ... 11/03/2009 T-Comprehensive Metabolic Panel [phone]) ... 11/03/2009 T-CBC w/Diff ([id number]) ... 11/03/2009  Patient Instructions: 1)  Your physician recommends that you schedule a follow-up appointment in: 10 months 2)  Your physician recommends that you return for lab work in: 2 months 3)  Your physician has recommended you make the following change in your medication: change crestor to simvastatin 40mg  daily with next refill, increase lisinopril to 20mg  daily Prescriptions: LISINOPRIL 20 MG TABS (LISINOPRIL) Take one tablet by mouth daily  #30 x 6   Entered by:   Teressa Lower RN   Authorized by:   Kathlen Brunswick, MD, [facility]   Signed by:   Teressa Lower RN on 09/02/2009   Method used:   Electronically to        [facility] # [phone]* (retail)       [address]       [place], [place]  [address]       Ph: [phone] or [phone]       Fax: [phone]   RxID:   [id number] SIMVASTATIN 40 MG TABS (SIMVASTATIN) Take one tablet by mouth daily at bedtime  #30 x 6   Entered by:   Teressa Lower RN   Authorized by:   Kathlen Brunswick, MD, [facility]   Signed by:   Teressa Lower RN on 09/02/2009   Method used:   Electronically to        [facility] # [phone]* (retail)       [address]       [place], [place]  [address]       Ph: [phone] or [phone]       Fax: [phone]   RxID:   [id number]

## 2010-04-21 NOTE — Letter (Signed)
Summary: [facility] CENTER OFFICE NOTE 07-29-09  [facility]ENTER OFFICE NOTE 07-29-09   Imported By: Faythe Ghee 08/25/2009 13:48:30  _____________________________________________________________________  External Attachment:    Type:   Image     Comment:   External Document

## 2010-04-21 NOTE — Letter (Signed)
[facility]  [facility] 10/01/09   Imported By: Faythe Ghee 12/01/2009 12:28:04  _____________________________________________________________________  External Attachment:    Type:   Image     Comment:   External Document

## 2010-04-21 NOTE — Letter (Signed)
Summary: Cardiac Catheterization Instructions- [facility]  [facility] [place]  [address], [place] [address]   Phone: [phone]  Fax: [phone]     06/06/2009 MRN: [id number]  [facility] [place] [address] [place], [place]  [address]  Dear Mr. Palladino,   You are scheduled for a Cardiac Catheterization on Tuesday March 22 with Dr.McAlhany  Please arrive to the 1st floor of the Heart and Vascular Center at [facility] at __8:30___ am on the day of your procedure. Please do not arrive before 6:30 a.m. Call the Heart and Vascular Center at [phone] if you are unable to make your appointmnet. The Code to get into the parking garage under the building is__9000______. Take the elevators to the 1st floor. You must have someone to drive you home. Someone must be with you for the first 24 hours after you arrive home. Please wear clothes that are easy to get on and off and wear slip-on shoes. Do not eat or drink after midnight except water with your medications that morning. Bring all your medications and current insurance cards with you.  ___ DO NOT take these medications before your procedure: ________________________________________________________________  _X__ Make sure you take your aspirin.  _X__ You may take ALL of your medications with water that morning. ________________________________________________________________________________________________________________________________  ___ DO NOT take ANY medications before your procedure.  ___ Pre-med instructions:  ________________________________________________________________________________________________________________________________  The usual length of stay after your procedure is 2 to 3 hours. This can vary.  If you have any questions, please call the office at the number listed above.   Larita Fife Via LPN

## 2010-04-21 NOTE — Letter (Signed)
Summary: [facility]CER CENTER PROGRESS NOTE  [facility] CENTER PROGRESS NOTE   Imported By: Faythe Ghee 07/29/2009 09:45:48  _____________________________________________________________________  External Attachment:    Type:   Image     Comment:   External Document

## 2010-04-21 NOTE — Miscellaneous (Signed)
Summary: CHEST XRAY 07/14/2009  Clinical Lists Changes  Observations: Added new observation of CXR RESULTS:  Clinical Data: Lung cancer.    PORTABLE CHEST - 1 VIEW    Comparison: 07/01/2009    Findings: Prior CABG.  Right Port-A-Cath has been placed.  The tip   is in the upper SVC.  No pneumothorax.  Lungs appear clear.  No   effusions.  Heart is normal size.    IMPRESSION:   Right Port-A-Cath tip in the SVC.  No pneumothorax.    Read By:  Charlett Nose,  M.D.   Released By:  Charlett Nose,  M.D. (07/14/2009 10:11)      CXR  Procedure date:  07/14/2009  Findings:       Clinical Data: Lung cancer.    PORTABLE CHEST - 1 VIEW    Comparison: 07/01/2009    Findings: Prior CABG.  Right Port-A-Cath has been placed.  The tip   is in the upper SVC.  No pneumothorax.  Lungs appear clear.  No   effusions.  Heart is normal size.    IMPRESSION:   Right Port-A-Cath tip in the SVC.  No pneumothorax.    Read By:  Charlett Nose,  M.D.   Released By:  Charlett Nose,  M.D.

## 2010-04-21 NOTE — Letter (Signed)
Summary: External Other  External Other   Imported By: Peggyann Shoals 08/25/2009 09:56:12  _____________________________________________________________________  External Attachment:    Type:   Image     Comment:   External Document

## 2010-04-21 NOTE — Miscellaneous (Signed)
Summary: LABS CBCD,PT,PTT,BMP,INR,06/06/2009  Clinical Lists Changes  Observations: Added new observation of CALCIUM: 9.8 mg/dL (14/78/2956 21:30) Added new observation of CREATININE: 1.25 mg/dL (86/57/8469 62:95) Added new observation of BUN: 19 mg/dL (28/41/3244 01:02) Added new observation of BG RANDOM: 89 mg/dL (72/53/6644 03:47) Added new observation of CO2 PLSM/SER: 24 meq/L (06/06/2009 10:04) Added new observation of CL SERUM: 106 meq/L (06/06/2009 10:04) Added new observation of K SERUM: 3.9 meq/L (06/06/2009 10:04) Added new observation of NA: 140 meq/L (06/06/2009 10:04) Added new observation of PLATELETK/UL: 264 K/uL (06/06/2009 10:04) Added new observation of MCV: 90.1 fL (06/06/2009 10:04) Added new observation of HCT: 43.9 % (06/06/2009 10:04) Added new observation of HGB: 14.8 g/dL (42/59/5638 75:64) Added new observation of WBC COUNT: 9.6 10*3/microliter (06/06/2009 10:04) Added new observation of INR: 1.06  (06/06/2009 10:04) Added new observation of PT PATIENT: 13.7 s (06/06/2009 10:04) Added new observation of PTT PATIENT: 34 s (06/06/2009 10:04)

## 2010-04-21 NOTE — Assessment & Plan Note (Signed)
Summary: **np6 sever chest pain angina   Visit Type:  Follow-up Referring Provider:  Mariel Sleet Primary Provider:  Sherryll Burger  CC:  chest pain.  History of Present Illness: [name] is a 55 CM with no prior cardiac history that we are seeing on referal from Dr. Iven Finn for cardiac evaluation.  He has a history of hypertension, tobacco abuse, and colorectal cancer with subsequent bowel resection and permanent colostomy.  He has recently had a CT of his chest revealing multiple lesions/nodules bilaterally with concern for Lung CA metastatic disease.  Over the last month, [name] has been experiencing exertional chest pain, described as burning midsternal radiating across the chest with radiation down both arms.  Associated flushing and mild/minimal SOB.  This occurs every time he exerts himself and the burning goes away with rest.  Taking his garbage can out to the curb, walking around in his yard, sweeping, all cause chest discomfort.  We are requested to evaluate and make recommendations.  Preventive Screening-Counseling & Management  Alcohol-Tobacco     Alcohol drinks/day: <1     Smoking Status: current     Smoking Cessation Counseling: yes     Smoke Cessation Stage: contemplative     Packs/Day: 1.5     Pack years: 50  Current Medications (verified): 1)  Multi-Vitamin .... Take 1 Tablet By Mouth Once A Day 2)  Ibuprofen .... As Needed 3)  Asa 81 Mg .... Occassionally 4)  Percocet 5-325 Mg Tabs (Oxycodone-Acetaminophen) .... Take As Needed For Pain  Allergies (verified): No Known Drug Allergies  Past History:  Past medical, surgical, family and social histories (including risk factors) reviewed, and no changes noted (except as noted below).  Past Medical History: Reviewed history from 01/23/2009 and no changes required. 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 04/2006 abdominoperineal resection w/ permanent colostomy by Dr Maryruth Bun 07/2006 debridement perineal wound 04/2007 expl lap, lysis adhesions, incidental appendectomy Dr Lovell Sheehan  Family History: Reviewed history from 01/23/2009 and no changes required. No known family history of colorectal carcinoma, IBD, liver or chronic GI problems.  Social History: Reviewed history from 01/23/2009 and no changes required. 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 Alcohol drinks/day:  <1 Smoking Status:  current Packs/Day:  1.5 Pack years:  50  Review of Systems       The patient complains of chest pain.         flushing,  mild dyspnea All other systems have been reviewed and are negative unless stated above.   Vital Signs:  Patient profile:   55 year old male Height:      73 inches Weight:      193 pounds BMI:     25.56 Pulse rate:   98 / minute BP sitting:   158 / 85  (right arm)  Vitals Entered By: Dreama Saa, CNA (June 06, 2009 11:03 AM)  Physical Exam  General:  Well developed, well nourished, in no acute distress. Head:  normocephalic and atraumatic Eyes:  PERRLA/EOM intact; conjunctiva and lids normal. Ears:  TM's intact and clear with normal canals and hearing Nose:  no deformity, discharge, inflammation, or lesions  Mouth:  Teeth, gums and palate normal. Oral mucosa normal. Neck:  Neck supple, no JVD. No masses, thyromegaly or abnormal cervical nodes. Lungs:  Clear bilaterally to auscultation and percussion. Heart:  Non-displaced PMI, chest non-tender; regular rate and rhythm, S1, S2 without murmurs, rubs or gallops. Carotid upstroke normal, no  bruit. Normal abdominal aortic size, no bruits. Femorals normal pulses, no bruits. Pedals normal pulses. No edema, no varicosities. Abdomen:  Bowel sounds positive; abdomen soft and non-tender permanent colostomy on the left. Well healed midline scar Msk:  Back normal, normal gait. Muscle strength and tone normal. Extremities:  No clubbing or cyanosis. Neurologic:  Alert and oriented x 3. Psych:  Normal affect.   EKG  Procedure date:  06/06/2009  Findings:      Normal sinus rhythm with rate of:  84bpm with delayed R wave progression.  Impression & Recommendations:  Problem # 1:  ANGINA, STABLE/EXERTIONAL (ICD-413.9) [name] symptoms are worrisome for cardiac etiology of chest pain.  The symptoms are typical for stable angina.  There is a a question of metastatic lung cancer per CT scan.  He will need a biospy.  After discussion with the patient, we have recommended a cardiac catherization.  I have discussed the case with Dr. Clifton James who will be preforming the catherization on Tuesday March 22nd. He is made aware of other issues and planned lung biopsy.  He will determine extent of CAD if at all and make further recommendations.  This is also discussed with Dr. Dietrich Pates who agrees with this plan.    Problem # 2:  HYPERTENSION (ICD-401.9) He will be started on lisinopril 20mg  daily for blood pressure control.   Orders: T-Basic Metabolic Panel [id number]) T-CBC w/Diff [phone]) T-PTT ([id number]) T-Protime, Auto ([id number])  Problem # 3:  Hx of RECTAL CANCER (ICD-154.1) There is concern for mets to the lung per CT scan.  More recommendations per Dr. Mariel Sleet.  Other Orders: T-Chest x-ray, 2 views ([id number]) Cardiac Catheterization (Cardiac Cath)  Patient Instructions: 1)  Your physician recommends that you schedule a follow-up appointment in: Post cath 2)  Your physician recommends that you return for lab work in: Today 3)  A chest x-ray takes a picture of the organs and  structures inside the chest, including the heart, lungs, and blood vessels. This test can show several things, including, whether the heart is enlarged; whether fluid is building up in the lungs; and whether pacemaker / defibrillator leads are still in place. 4)  Your physician has requested that you have a cardiac catheterization.  Cardiac catheterization is used to diagnose and/or treat various heart conditions. Doctors may recommend this procedure for a number of different reasons. The most common reason is to evaluate chest pain. Chest pain can be a symptom of coronary artery disease (CAD), and cardiac catheterization can show whether plaque is narrowing or blocking your heart's arteries. This procedure is also used to evaluate the valves, as well as measure the blood flow and oxygen levels in different parts of your heart.  For further information please visit [email or web address]  Please follow instruction sheet, as given.

## 2010-04-21 NOTE — Miscellaneous (Signed)
Summary: HOSPITAL LABS 08/12/2009  Clinical Lists Changes  Observations: Added new observation of PLATELETK/UL: 156 K/uL (08/12/2009 10:12) Added new observation of MCV: 88.9 fL (08/12/2009 10:12) Added new observation of HCT: 38.4 % (08/12/2009 10:12) Added new observation of HGB: 13.6 g/dL (60/45/4098 11:91) Added new observation of WBC COUNT: 6.7 10*3/microliter (08/12/2009 10:12)

## 2010-04-21 NOTE — Letter (Signed)
Summary: External Other  External Other   Imported By: Peggyann Shoals 07/30/2009 08:44:00  _____________________________________________________________________  External Attachment:    Type:   Image     Comment:   External Document

## 2010-04-21 NOTE — Miscellaneous (Signed)
Summary: labs cbcd,cmp,lipids,11/03/2009  Clinical Lists Changes  Observations: Added new observation of CALCIUM: 10.0 mg/dL (69/62/9528 4:13) Added new observation of ALBUMIN: 4.8 g/dL (24/40/1027 2:53) Added new observation of PROTEIN, TOT: 8.0 g/dL (66/44/0347 4:25) Added new observation of SGPT (ALT): 24 units/L (11/03/2009 8:28) Added new observation of SGOT (AST): 23 units/L (11/03/2009 8:28) Added new observation of ALK PHOS: 111 units/L (11/03/2009 8:28) Added new observation of CREATININE: 1.39 mg/dL (95/63/8756 4:33) Added new observation of BUN: 20 mg/dL (29/51/8841 6:60) Added new observation of BG RANDOM: 107 mg/dL (63/03/6008 9:32) Added new observation of CO2 PLSM/SER: 21 meq/L (11/03/2009 8:28) Added new observation of CL SERUM: 106 meq/L (11/03/2009 8:28) Added new observation of K SERUM: 4.7 meq/L (11/03/2009 8:28) Added new observation of NA: 142 meq/L (11/03/2009 8:28) Added new observation of HDL: 39 mg/dL (35/57/3220 2:54) Added new observation of TRIGLYC TOT: 477 mg/dL (27/08/2374 2:83) Added new observation of CHOLESTEROL: 380 mg/dL (15/17/6160 7:37) Added new observation of ABSOLUTE BAS: 0.1 K/uL (11/03/2009 8:28) Added new observation of BASOPHIL %: 1 % (11/03/2009 8:28) Added new observation of EOS ABSLT: 0.3 K/uL (11/03/2009 8:28) Added new observation of % EOS AUTO: 3 % (11/03/2009 8:28) Added new observation of ABSOLUTE MON: 0.8 K/uL (11/03/2009 8:28) Added new observation of MONOCYTE %: 10 % (11/03/2009 8:28) Added new observation of ABS LYMPHOCY: 1.7 K/uL (11/03/2009 8:28) Added new observation of LYMPHS %: 21 % (11/03/2009 8:28) Added new observation of PLATELETK/UL: 171 K/uL (11/03/2009 8:28) Added new observation of RDW: 18.6 % (11/03/2009 8:28) Added new observation of MCHC RBC: 32.9 g/dL (10/62/6948 5:46) Added new observation of MCV: 96.5 fL (11/03/2009 8:28) Added new observation of HCT: 47.0 % (11/03/2009 8:28) Added new observation of HGB:  16.0 g/dL (27/05/5007 3:81) Added new observation of RBC M/UL: 4.87 M/uL (11/03/2009 8:28) Added new observation of WBC COUNT: 8.1 10*3/microliter (11/03/2009 8:28)

## 2010-04-21 NOTE — Letter (Signed)
Summary: [facility]  [facility]   Imported By: Rexene Alberts 11/21/2009 11:39:41  _____________________________________________________________________  External Attachment:    Type:   Image     Comment:   External Document

## 2010-04-21 NOTE — Miscellaneous (Signed)
Summary: Medications update  Clinical Lists Changes  Medications: Removed medication of * MULTI-VITAMIN Take 1 tablet by mouth once a day Removed medication of * IBUPROFEN As needed Removed medication of * ASA 81 MG Occassionally Changed medication from PERCOCET 5-325 MG TABS (OXYCODONE-ACETAMINOPHEN) TAKE AS NEEDED FOR PAIN to PERCOCET 7.5-325 MG TABS (OXYCODONE-ACETAMINOPHEN) 1 to 2 tablets q 4 hrs. as needed Added new medication of ASPIRIN EC 325 MG TBEC (ASPIRIN) Take one tablet by mouth daily Added new medication of LISINOPRIL 10 MG TABS (LISINOPRIL) 1 tablet by mouth once daily Added new medication of MULTIVITAMINS  TABS (MULTIPLE VITAMIN) 1 tablet by mouth once daily Removed medication of ZESTRIL 10 MG TABS (LISINOPRIL) take 1 tablet by mouth once daily

## 2010-04-21 NOTE — Letter (Signed)
Summary: [facility] FROM 10/01/09  [facility] FROM 10/01/09   Imported By: Rexene Alberts 11/18/2009 14:49:12  _____________________________________________________________________  External Attachment:    Type:   Image     Comment:   External Document

## 2010-04-21 NOTE — Progress Notes (Signed)
Summary: Refills  Phone Note Call from Patient   Caller: Patient Reason for Call: Refill Medication Summary of Call: pt needs Metoprolol, Crestor, and Lisinopril called to [facility] in [place]/tg Initial call taken by: Raechel Ache [facility],  August 22, 2009 11:58 AM  Follow-up for Phone Call        Rx's sent to [facility], pt. aware. Follow-up by: Larita Fife Via LPN,  August 22, 1608 1:34 PM    New/Updated Medications: METOPROLOL TARTRATE 50 MG TABS (METOPROLOL TARTRATE) take 1 tablet by mouth two times a day CRESTOR 20 MG TABS (ROSUVASTATIN CALCIUM) take 1 tablet by mouth at bedtime ZESTRIL 10 MG TABS (LISINOPRIL) take 1 tablet by mouth once daily Prescriptions: ZESTRIL 10 MG TABS (LISINOPRIL) take 1 tablet by mouth once daily  #30 x 0   Entered by:   Larita Fife Via LPN   Authorized by:   Kathlen Brunswick, MD, [facility]   Signed by:   Larita Fife Via LPN on 96/06/5407   Method used:   Electronically to        [facility] # [phone]* (retail)       [address]       [place], [place]  [address]       Ph: [phone] or [phone]       Fax: [phone]   RxID:   [id number] CRESTOR 20 MG TABS (ROSUVASTATIN CALCIUM) take 1 tablet by mouth at bedtime  #30 x 0   Entered by:   Larita Fife Via LPN   Authorized by:   Kathlen Brunswick, MD, [facility]   Signed by:   Larita Fife Via LPN on 95/63/8756   Method used:   Electronically to        [facility] # [phone]* (retail)       [address]       [place], [place]  [address]       Ph: [phone] or [phone]       Fax: [phone]   RxID:   [id number] METOPROLOL TARTRATE 50 MG TABS (METOPROLOL TARTRATE) take 1 tablet by mouth two times a day  #60 x 0   Entered by:   Larita Fife Via LPN   Authorized by:   Kathlen Brunswick, MD, [facility]   Signed by:   Larita Fife Via LPN on 73/71/0626   Method used:   Electronically to        [facility] # [phone]* (retail)       [address]       [place], [place]  [address]       Ph: [phone] or  [phone]       Fax: [phone]   RxID:   [id number]

## 2010-04-21 NOTE — Letter (Signed)
Summary: [facility] [employer] [facility], [employer] at [employer]  [address], [place] [address]   Phone: [phone]  Fax: [phone]     September 02, 2009 MRN: [id number]   [facility] [address] [place], [place]  [address]      YOUR LAB WORK IS DUE  November 03, 2009 _________________________________________  Please go to [facility], located across the street from [facility] on the second floor.  Hours are Monday - Friday 7am until 7:30pm         Saturday 8am until 12noon    _X_  DO NOT EAT OR DRINK AFTER MIDNIGHT EVENING PRIOR TO LABWORK  __ YOUR LABWORK IS NOT FASTING --YOU MAY EAT PRIOR TO LABWORK

## 2010-04-21 NOTE — Letter (Signed)
Summary: External Correspondence  External Correspondence   Imported By: Dreama Saa, CNA 10/28/2009 14:14:46  _____________________________________________________________________  External Attachment:    Type:   Image     Comment:   External Document

## 2010-04-23 NOTE — Letter (Signed)
Summary: [facility]  [facility]   Imported By: Rexene Alberts 04/02/2010 09:02:57  _____________________________________________________________________  External Attachment:    Type:   Image     Comment:   External Document

## 2010-04-23 NOTE — Letter (Signed)
Summary: CANCER CENTER NOTE  [facility] CENTER NOTE   Imported By: Faythe Ghee 04/09/2010 13:12:11  _____________________________________________________________________  External Attachment:    Type:   Image     Comment:   External Document

## 2010-04-23 NOTE — Letter (Signed)
Summary: [facility]  [facility]   Imported By: Rexene Alberts 04/02/2010 09:09:55  _____________________________________________________________________  External Attachment:    Type:   Image     Comment:   External Document

## 2010-04-23 NOTE — Letter (Signed)
Summary: CANCER CENTER NOTE  C[facility]ER CENTER NOTE   Imported By: Faythe Ghee 04/09/2010 13:11:49  _____________________________________________________________________  External Attachment:    Type:   Image     Comment:   External Document

## 2010-04-27 ENCOUNTER — Encounter (HOSPITAL_COMMUNITY): Payer: Managed Care, Other (non HMO)

## 2010-04-27 ENCOUNTER — Ambulatory Visit (HOSPITAL_COMMUNITY): Payer: PRIVATE HEALTH INSURANCE | Admitting: Oncology

## 2010-04-27 ENCOUNTER — Encounter (HOSPITAL_COMMUNITY): Payer: Managed Care, Other (non HMO) | Attending: Oncology

## 2010-04-27 DIAGNOSIS — C801 Malignant (primary) neoplasm, unspecified: Secondary | ICD-10-CM

## 2010-04-27 DIAGNOSIS — C189 Malignant neoplasm of colon, unspecified: Secondary | ICD-10-CM

## 2010-04-27 DIAGNOSIS — C7800 Secondary malignant neoplasm of unspecified lung: Secondary | ICD-10-CM | POA: Insufficient documentation

## 2010-04-27 DIAGNOSIS — Z85048 Personal history of other malignant neoplasm of rectum, rectosigmoid junction, and anus: Secondary | ICD-10-CM | POA: Insufficient documentation

## 2010-04-27 DIAGNOSIS — Z79899 Other long term (current) drug therapy: Secondary | ICD-10-CM | POA: Insufficient documentation

## 2010-04-27 LAB — COMPREHENSIVE METABOLIC PANEL
Alkaline Phosphatase: 69 U/L (ref 39–117)
BUN: 17 mg/dL (ref 6–23)
Calcium: 9.5 mg/dL (ref 8.4–10.5)
GFR calc Af Amer: >60 mL/min (ref >60)
Glucose, Bld: 98 mg/dL (ref 70–99)
Potassium: 4.2 mEq/L (ref 3.5–5.1)
Sodium: 140 mEq/L (ref 135–145)
Total Protein: 6.3 g/dL (ref 6.0–8.3)

## 2010-04-27 LAB — CBC
Hemoglobin: 14.9 g/dL (ref 13.0–17.0)
Platelets: 196 10*3/uL (ref 150–400)
RBC: 4.75 MIL/uL (ref 4.22–5.81)
WBC: 8.1 10*3/uL (ref 4.0–10.5)

## 2010-04-27 LAB — DIFFERENTIAL
Basophils Absolute: 0.1 10*3/uL (ref 0.0–0.1)
Basophils Relative: 1 % (ref 0–1)
Eosinophils Absolute: 0.3 10*3/uL (ref 0.0–0.7)
Monocytes Relative: 6 % (ref 3–12)
Neutro Abs: 5.3 10*3/uL (ref 1.7–7.7)
Neutrophils Relative %: 66 % (ref 43–77)

## 2010-04-27 LAB — CEA: CEA: 4 ng/mL (ref 0.0–5.0)

## 2010-05-12 ENCOUNTER — Ambulatory Visit (HOSPITAL_COMMUNITY)
Admission: RE | Admit: 2010-05-12 | Discharge: 2010-05-12 | Disposition: A | Discharge status: Home / Self Care | Payer: Managed Care, Other (non HMO) | Source: Ambulatory Visit | Attending: Oncology | Admitting: Oncology

## 2010-05-12 ENCOUNTER — Other Ambulatory Visit (HOSPITAL_COMMUNITY): Payer: Self-pay

## 2010-05-12 ENCOUNTER — Encounter (HOSPITAL_COMMUNITY): Payer: Self-pay

## 2010-05-12 ENCOUNTER — Other Ambulatory Visit (HOSPITAL_COMMUNITY): Payer: Self-pay | Admitting: Oncology

## 2010-05-12 DIAGNOSIS — Z85038 Personal history of other malignant neoplasm of large intestine: Secondary | ICD-10-CM

## 2010-05-12 DIAGNOSIS — C7800 Secondary malignant neoplasm of unspecified lung: Secondary | ICD-10-CM | POA: Insufficient documentation

## 2010-05-12 DIAGNOSIS — C19 Malignant neoplasm of rectosigmoid junction: Secondary | ICD-10-CM | POA: Insufficient documentation

## 2010-05-12 DIAGNOSIS — Z09 Encounter for follow-up examination after completed treatment for conditions other than malignant neoplasm: Secondary | ICD-10-CM | POA: Insufficient documentation

## 2010-05-12 HISTORY — DX: Essential (primary) hypertension: I10

## 2010-05-12 MED ORDER — IOHEXOL 300 MG/ML  SOLN
100.0000 mL | Freq: Once | INTRAMUSCULAR | Status: AC | PRN
  Administered 2010-05-12: 100 mL via INTRAVENOUS

## 2010-05-13 ENCOUNTER — Ambulatory Visit (HOSPITAL_COMMUNITY): Payer: Managed Care, Other (non HMO) | Admitting: Oncology

## 2010-05-13 ENCOUNTER — Encounter (HOSPITAL_COMMUNITY): Admission: RE | Admit: 2010-05-13 | Payer: Self-pay | Source: Home / Self Care | Admitting: Oncology

## 2010-05-13 ENCOUNTER — Other Ambulatory Visit (HOSPITAL_COMMUNITY): Payer: Self-pay | Admitting: Oncology

## 2010-05-13 DIAGNOSIS — C801 Malignant (primary) neoplasm, unspecified: Secondary | ICD-10-CM

## 2010-05-13 DIAGNOSIS — C20 Malignant neoplasm of rectum: Secondary | ICD-10-CM

## 2010-05-13 DIAGNOSIS — C189 Malignant neoplasm of colon, unspecified: Secondary | ICD-10-CM

## 2010-06-01 LAB — CBC
HCT: 45.4 % (ref 39.0–52.0)
Hemoglobin: 16 g/dL (ref 13.0–17.0)
RDW: 13.8 % (ref 11.5–15.5)
WBC: 8.5 10*3/uL (ref 4.0–10.5)

## 2010-06-01 LAB — COMPREHENSIVE METABOLIC PANEL
ALT: 17 U/L (ref 0–53)
AST: 18 U/L (ref 0–37)
Albumin: 4.3 g/dL (ref 3.5–5.2)
CO2: 25 mEq/L (ref 19–32)
Calcium: 9.2 mg/dL (ref 8.4–10.5)
GFR calc Af Amer: >60 mL/min (ref >60)
GFR calc non Af Amer: >60 mL/min (ref >60)
Sodium: 139 mEq/L (ref 135–145)

## 2010-06-01 LAB — DIFFERENTIAL
Eosinophils Absolute: 0.2 10*3/uL (ref 0.0–0.7)
Eosinophils Relative: 3 % (ref 0–5)
Lymphs Abs: 1.4 10*3/uL (ref 0.7–4.0)
Monocytes Absolute: 0.4 10*3/uL (ref 0.1–1.0)
Monocytes Relative: 5 % (ref 3–12)

## 2010-06-03 LAB — CBC
HCT: 43.5 % (ref 39.0–52.0)
MCH: 33.7 pg (ref 26.0–34.0)
MCH: 33.9 pg (ref 26.0–34.0)
MCHC: 34 g/dL (ref 30.0–36.0)
MCV: 99.4 fL (ref 78.0–100.0)
MCV: 99.5 fL (ref 78.0–100.0)
Platelets: 209 10*3/uL (ref 150–400)
Platelets: 215 10*3/uL (ref 150–400)
RBC: 4.38 MIL/uL (ref 4.22–5.81)
RDW: 16.3 % — ABNORMAL HIGH (ref 11.5–15.5)

## 2010-06-03 LAB — DIFFERENTIAL
Basophils Relative: 1 % (ref 0–1)
Eosinophils Absolute: 0.2 10*3/uL (ref 0.0–0.7)
Eosinophils Relative: 3 % (ref 0–5)
Lymphocytes Relative: 18 % (ref 12–46)
Lymphs Abs: 1.4 10*3/uL (ref 0.7–4.0)
Lymphs Abs: 1.5 10*3/uL (ref 0.7–4.0)
Monocytes Absolute: 0.5 10*3/uL (ref 0.1–1.0)
Monocytes Relative: 6 % (ref 3–12)
Monocytes Relative: 7 % (ref 3–12)
Neutro Abs: 5.1 10*3/uL (ref 1.7–7.7)
Neutrophils Relative %: 69 % (ref 43–77)

## 2010-06-03 LAB — COMPREHENSIVE METABOLIC PANEL
BUN: 14 mg/dL (ref 6–23)
CO2: 24 mEq/L (ref 19–32)
Calcium: 9 mg/dL (ref 8.4–10.5)
GFR calc non Af Amer: 48 mL/min — ABNORMAL LOW (ref >60)
Glucose, Bld: 113 mg/dL — ABNORMAL HIGH (ref 70–99)
Total Protein: 6.9 g/dL (ref 6.0–8.3)

## 2010-06-04 ENCOUNTER — Encounter: Payer: Self-pay | Admitting: Internal Medicine

## 2010-06-04 LAB — DIFFERENTIAL
Basophils Absolute: 0 10*3/uL (ref 0.0–0.1)
Basophils Absolute: 0.1 10*3/uL (ref 0.0–0.1)
Basophils Relative: 1 % (ref 0–1)
Basophils Relative: 1 % (ref 0–1)
Eosinophils Absolute: 0.2 10*3/uL (ref 0.0–0.7)
Lymphocytes Relative: 20 % (ref 12–46)
Lymphocytes Relative: 29 % (ref 12–46)
Lymphs Abs: 1.3 10*3/uL (ref 0.7–4.0)
Lymphs Abs: 1.4 10*3/uL (ref 0.7–4.0)
Monocytes Relative: 6 % (ref 3–12)
Neutro Abs: 2.7 10*3/uL (ref 1.7–7.7)
Neutro Abs: 4.8 10*3/uL (ref 1.7–7.7)
Neutrophils Relative %: 54 % (ref 43–77)
Neutrophils Relative %: 70 % (ref 43–77)
Neutrophils Relative %: 71 % (ref 43–77)

## 2010-06-04 LAB — URINALYSIS, DIPSTICK ONLY
Bilirubin Urine: NEGATIVE
Glucose, UA: NEGATIVE mg/dL
Glucose, UA: NEGATIVE mg/dL
Hgb urine dipstick: NEGATIVE
Ketones, ur: NEGATIVE mg/dL
Leukocytes, UA: NEGATIVE
Specific Gravity, Urine: 1.02 (ref 1.005–1.030)
Urobilinogen, UA: 0.2 mg/dL (ref 0.0–1.0)
pH: 5.5 (ref 5.0–8.0)

## 2010-06-04 LAB — CBC
HCT: 39.7 % (ref 39.0–52.0)
HCT: 40.2 % (ref 39.0–52.0)
Hemoglobin: 13.7 g/dL (ref 13.0–17.0)
MCH: 34.1 pg — ABNORMAL HIGH (ref 26.0–34.0)
MCHC: 34.2 g/dL (ref 30.0–36.0)
MCHC: 34.4 g/dL (ref 30.0–36.0)
MCV: 99.7 fL (ref 78.0–100.0)
Platelets: 185 10*3/uL (ref 150–400)
RBC: 4.07 MIL/uL — ABNORMAL LOW (ref 4.22–5.81)
RDW: 16.3 % — ABNORMAL HIGH (ref 11.5–15.5)
WBC: 5 10*3/uL (ref 4.0–10.5)
WBC: 6.9 10*3/uL (ref 4.0–10.5)

## 2010-06-04 LAB — COMPREHENSIVE METABOLIC PANEL
ALT: 14 U/L (ref 0–53)
Alkaline Phosphatase: 70 U/L (ref 39–117)
CO2: 25 mEq/L (ref 19–32)
Calcium: 9.2 mg/dL (ref 8.4–10.5)
GFR calc non Af Amer: >60 mL/min (ref >60)
Glucose, Bld: 124 mg/dL — ABNORMAL HIGH (ref 70–99)
Sodium: 140 mEq/L (ref 135–145)

## 2010-06-04 LAB — CEA: CEA: 5.5 ng/mL — ABNORMAL HIGH (ref 0.0–5.0)

## 2010-06-05 LAB — DIFFERENTIAL
Basophils Relative: 1 % (ref 0–1)
Eosinophils Absolute: 0.3 10*3/uL (ref 0.0–0.7)
Eosinophils Absolute: 0.3 10*3/uL (ref 0.0–0.7)
Eosinophils Relative: 3 % (ref 0–5)
Eosinophils Relative: 4 % (ref 0–5)
Lymphocytes Relative: 24 % (ref 12–46)
Lymphs Abs: 1.5 10*3/uL (ref 0.7–4.0)
Lymphs Abs: 1.7 10*3/uL (ref 0.7–4.0)
Monocytes Absolute: 0.8 10*3/uL (ref 0.1–1.0)
Monocytes Relative: 10 % (ref 3–12)
Monocytes Relative: 10 % (ref 3–12)
Neutrophils Relative %: 68 % (ref 43–77)

## 2010-06-05 LAB — COMPREHENSIVE METABOLIC PANEL
ALT: 22 U/L (ref 0–53)
ALT: 28 U/L (ref 0–53)
AST: 23 U/L (ref 0–37)
AST: 31 U/L (ref 0–37)
Alkaline Phosphatase: 108 U/L (ref 39–117)
CO2: 26 mEq/L (ref 19–32)
Calcium: 9.1 mg/dL (ref 8.4–10.5)
Creatinine, Ser: 1.21 mg/dL (ref 0.4–1.5)
GFR calc Af Amer: >60 mL/min (ref >60)
GFR calc Af Amer: >60 mL/min (ref >60)
GFR calc non Af Amer: >60 mL/min (ref >60)
Glucose, Bld: 107 mg/dL — ABNORMAL HIGH (ref 70–99)
Potassium: 4.6 mEq/L (ref 3.5–5.1)
Sodium: 137 mEq/L (ref 135–145)
Sodium: 139 mEq/L (ref 135–145)
Total Protein: 7 g/dL (ref 6.0–8.3)
Total Protein: 7.8 g/dL (ref 6.0–8.3)

## 2010-06-05 LAB — CBC
HCT: 47.1 % (ref 39.0–52.0)
MCH: 32.4 pg (ref 26.0–34.0)
MCHC: 34.6 g/dL (ref 30.0–36.0)
MCV: 95.3 fL (ref 78.0–100.0)
Platelets: 164 10*3/uL (ref 150–400)
Platelets: 171 10*3/uL (ref 150–400)
RBC: 4.94 MIL/uL (ref 4.22–5.81)
RDW: 19.5 % — ABNORMAL HIGH (ref 11.5–15.5)
WBC: 8.3 10*3/uL (ref 4.0–10.5)

## 2010-06-05 LAB — URINALYSIS, DIPSTICK ONLY
Bilirubin Urine: NEGATIVE
Bilirubin Urine: NEGATIVE
Hgb urine dipstick: NEGATIVE
Ketones, ur: NEGATIVE mg/dL
Ketones, ur: NEGATIVE mg/dL
Leukocytes, UA: NEGATIVE
Nitrite: NEGATIVE
Protein, ur: NEGATIVE mg/dL
Specific Gravity, Urine: >1.03 — ABNORMAL HIGH (ref 1.005–1.030)
Urobilinogen, UA: 0.2 mg/dL (ref 0.0–1.0)
pH: 5.5 (ref 5.0–8.0)
pH: 6 (ref 5.0–8.0)

## 2010-06-06 LAB — CBC
Hemoglobin: 15.4 g/dL (ref 13.0–17.0)
MCH: 31.5 pg (ref 26.0–34.0)
MCHC: 34.3 g/dL (ref 30.0–36.0)
MCV: 92.1 fL (ref 78.0–100.0)
RBC: 4.89 MIL/uL (ref 4.22–5.81)

## 2010-06-06 LAB — DIFFERENTIAL
Eosinophils Absolute: 0.3 10*3/uL (ref 0.0–0.7)
Eosinophils Relative: 4 % (ref 0–5)
Lymphocytes Relative: 24 % (ref 12–46)
Lymphs Abs: 1.7 10*3/uL (ref 0.7–4.0)
Monocytes Absolute: 0.8 10*3/uL (ref 0.1–1.0)
Monocytes Relative: 10 % (ref 3–12)

## 2010-06-07 LAB — URINALYSIS, DIPSTICK ONLY
Bilirubin Urine: NEGATIVE
Hgb urine dipstick: NEGATIVE
Hgb urine dipstick: NEGATIVE
Leukocytes, UA: NEGATIVE
Nitrite: NEGATIVE
Nitrite: NEGATIVE
Protein, ur: NEGATIVE mg/dL
Specific Gravity, Urine: 1.025 (ref 1.005–1.030)
Specific Gravity, Urine: 1.02 (ref 1.005–1.030)
Urobilinogen, UA: 0.2 mg/dL (ref 0.0–1.0)
Urobilinogen, UA: 0.2 mg/dL (ref 0.0–1.0)
pH: 6 (ref 5.0–8.0)

## 2010-06-07 LAB — COMPREHENSIVE METABOLIC PANEL
ALT: 30 U/L (ref 0–53)
AST: 26 U/L (ref 0–37)
Albumin: 3.9 g/dL (ref 3.5–5.2)
Alkaline Phosphatase: 80 U/L (ref 39–117)
CO2: 25 mEq/L (ref 19–32)
Chloride: 104 mEq/L (ref 96–112)
Creatinine, Ser: 1.09 mg/dL (ref 0.4–1.5)
GFR calc Af Amer: >60 mL/min (ref >60)
GFR calc non Af Amer: >60 mL/min (ref >60)
Potassium: 4.3 mEq/L (ref 3.5–5.1)
Total Bilirubin: 0.5 mg/dL (ref 0.3–1.2)

## 2010-06-07 LAB — CBC
HCT: 44 % (ref 39.0–52.0)
Hemoglobin: 14.1 g/dL (ref 13.0–17.0)
MCH: 31.4 pg (ref 26.0–34.0)
MCHC: 34.1 g/dL (ref 30.0–36.0)
MCHC: 34.4 g/dL (ref 30.0–36.0)
MCV: 92.2 fL (ref 78.0–100.0)
MCV: 90.8 fL (ref 78.0–100.0)
RBC: 4.49 MIL/uL (ref 4.22–5.81)
RDW: 17.9 % — ABNORMAL HIGH (ref 11.5–15.5)
WBC: 8.1 10*3/uL (ref 4.0–10.5)

## 2010-06-07 LAB — DIFFERENTIAL
Basophils Absolute: 0 10*3/uL (ref 0.0–0.1)
Basophils Absolute: 0.1 10*3/uL (ref 0.0–0.1)
Basophils Relative: 1 % (ref 0–1)
Eosinophils Absolute: 0.2 10*3/uL (ref 0.0–0.7)
Eosinophils Relative: 3 % (ref 0–5)
Lymphocytes Relative: 23 % (ref 12–46)
Lymphs Abs: 1.8 10*3/uL (ref 0.7–4.0)
Monocytes Absolute: 0.7 10*3/uL (ref 0.1–1.0)
Neutrophils Relative %: 62 % (ref 43–77)

## 2010-06-07 LAB — CEA: CEA: 4.9 ng/mL (ref 0.0–5.0)

## 2010-06-08 ENCOUNTER — Encounter: Payer: Self-pay | Admitting: Cardiology

## 2010-06-08 ENCOUNTER — Encounter (HOSPITAL_COMMUNITY): Payer: Managed Care, Other (non HMO)

## 2010-06-08 DIAGNOSIS — Z452 Encounter for adjustment and management of vascular access device: Secondary | ICD-10-CM

## 2010-06-08 DIAGNOSIS — C189 Malignant neoplasm of colon, unspecified: Secondary | ICD-10-CM

## 2010-06-08 LAB — DIFFERENTIAL
Eosinophils Absolute: 0.2 10*3/uL (ref 0.0–0.7)
Eosinophils Relative: 3 % (ref 0–5)
Lymphocytes Relative: 17 % (ref 12–46)
Lymphs Abs: 1.3 10*3/uL (ref 0.7–4.0)
Lymphs Abs: 1.2 10*3/uL (ref 0.7–4.0)
Monocytes Relative: 9 % (ref 3–12)
Neutro Abs: 5.5 10*3/uL (ref 1.7–7.7)
Neutrophils Relative %: 73 % (ref 43–77)
Neutrophils Relative %: 70 % (ref 43–77)

## 2010-06-08 LAB — CBC
Hemoglobin: 13.6 g/dL (ref 13.0–17.0)
MCV: 90.2 fL (ref 78.0–100.0)
MCV: 88.9 fL (ref 78.0–100.0)
Platelets: 139 10*3/uL — ABNORMAL LOW (ref 150–400)
RBC: 4.51 MIL/uL (ref 4.22–5.81)
RBC: 4.32 MIL/uL (ref 4.22–5.81)
WBC: 7.6 10*3/uL (ref 4.0–10.5)
WBC: 6.7 10*3/uL (ref 4.0–10.5)

## 2010-06-08 LAB — CEA: CEA: 3.8 ng/mL (ref 0.0–5.0)

## 2010-06-09 LAB — BASIC METABOLIC PANEL
BUN: 17 mg/dL (ref 6–23)
CO2: 25 mEq/L (ref 19–32)
Calcium: 9.1 mg/dL (ref 8.4–10.5)
Creatinine, Ser: 1.55 mg/dL — ABNORMAL HIGH (ref 0.4–1.5)
Glucose, Bld: 108 mg/dL — ABNORMAL HIGH (ref 70–99)

## 2010-06-09 LAB — DIFFERENTIAL
Basophils Absolute: 0.1 10*3/uL (ref 0.0–0.1)
Eosinophils Absolute: 0.3 10*3/uL (ref 0.0–0.7)
Eosinophils Relative: 5 % (ref 0–5)
Eosinophils Relative: 6 % — ABNORMAL HIGH (ref 0–5)
Lymphocytes Relative: 22 % (ref 12–46)
Lymphs Abs: 1.1 10*3/uL (ref 0.7–4.0)
Lymphs Abs: 1.3 10*3/uL (ref 0.7–4.0)
Monocytes Absolute: 0.4 10*3/uL (ref 0.1–1.0)
Monocytes Absolute: 0.4 10*3/uL (ref 0.1–1.0)
Monocytes Relative: 7 % (ref 3–12)

## 2010-06-09 LAB — CBC
HCT: 35.7 % — ABNORMAL LOW (ref 39.0–52.0)
Hemoglobin: 12.3 g/dL — ABNORMAL LOW (ref 13.0–17.0)
MCHC: 35.9 g/dL (ref 30.0–36.0)
MCHC: 36.1 g/dL — ABNORMAL HIGH (ref 30.0–36.0)
MCV: 88.3 fL (ref 78.0–100.0)
RBC: 4.04 MIL/uL — ABNORMAL LOW (ref 4.22–5.81)
RBC: 3.87 MIL/uL — ABNORMAL LOW (ref 4.22–5.81)
RDW: 14.2 % (ref 11.5–15.5)
WBC: 6.1 10*3/uL (ref 4.0–10.5)
WBC: 6.1 10*3/uL (ref 4.0–10.5)

## 2010-06-09 LAB — COMPREHENSIVE METABOLIC PANEL
AST: 20 U/L (ref 0–37)
Albumin: 3.6 g/dL (ref 3.5–5.2)
Chloride: 108 mEq/L (ref 96–112)
Creatinine, Ser: 1.24 mg/dL (ref 0.4–1.5)
GFR calc Af Amer: >60 mL/min (ref >60)
Total Bilirubin: 0.3 mg/dL (ref 0.3–1.2)

## 2010-06-09 LAB — CEA: CEA: 4 ng/mL (ref 0.0–5.0)

## 2010-06-09 NOTE — Letter (Signed)
Summary: [facility]  [facility]   Imported By: Rexene Alberts 06/04/2010 16:48:36  _____________________________________________________________________  External Attachment:    Type:   Image     Comment:   External Document

## 2010-06-10 LAB — CBC
HCT: 43.4 % (ref 39.0–52.0)
MCHC: 34.8 g/dL (ref 30.0–36.0)
MCV: 89.9 fL (ref 78.0–100.0)
Platelets: 199 10*3/uL (ref 150–400)
RDW: 13.7 % (ref 11.5–15.5)

## 2010-06-10 LAB — COMPREHENSIVE METABOLIC PANEL
BUN: 15 mg/dL (ref 6–23)
Calcium: 9 mg/dL (ref 8.4–10.5)
Creatinine, Ser: 0.92 mg/dL (ref 0.4–1.5)
Glucose, Bld: 90 mg/dL (ref 70–99)
Total Protein: 7.1 g/dL (ref 6.0–8.3)

## 2010-06-14 LAB — CBC
Hemoglobin: 11.9 g/dL — ABNORMAL LOW (ref 13.0–17.0)
Hemoglobin: 14.3 g/dL (ref 13.0–17.0)
MCHC: 33.5 g/dL (ref 30.0–36.0)
MCHC: 34 g/dL (ref 30.0–36.0)
MCHC: 34.4 g/dL (ref 30.0–36.0)
MCHC: 34.5 g/dL (ref 30.0–36.0)
MCHC: 34.7 g/dL (ref 30.0–36.0)
MCV: 90.7 fL (ref 78.0–100.0)
MCV: 91.1 fL (ref 78.0–100.0)
MCV: 92.1 fL (ref 78.0–100.0)
Platelets: 132 10*3/uL — ABNORMAL LOW (ref 150–400)
Platelets: 169 10*3/uL (ref 150–400)
RBC: 3.59 MIL/uL — ABNORMAL LOW (ref 4.22–5.81)
RBC: 3.73 MIL/uL — ABNORMAL LOW (ref 4.22–5.81)
RBC: 3.85 MIL/uL — ABNORMAL LOW (ref 4.22–5.81)
RBC: 3.87 MIL/uL — ABNORMAL LOW (ref 4.22–5.81)
RBC: 4.51 MIL/uL (ref 4.22–5.81)
RDW: 13.7 % (ref 11.5–15.5)
RDW: 14 % (ref 11.5–15.5)
WBC: 9.6 10*3/uL (ref 4.0–10.5)

## 2010-06-14 LAB — POCT I-STAT 4, (NA,K, GLUC, HGB,HCT)
Glucose, Bld: 113 mg/dL — ABNORMAL HIGH (ref 70–99)
Glucose, Bld: 98 mg/dL (ref 70–99)
HCT: 24 % — ABNORMAL LOW (ref 39.0–52.0)
HCT: 39 % (ref 39.0–52.0)
Hemoglobin: 13.3 g/dL (ref 13.0–17.0)
Hemoglobin: 9.2 g/dL — ABNORMAL LOW (ref 13.0–17.0)
Hemoglobin: 9.5 g/dL — ABNORMAL LOW (ref 13.0–17.0)
Potassium: 4.1 mEq/L (ref 3.5–5.1)
Sodium: 138 mEq/L (ref 135–145)
Sodium: 140 mEq/L (ref 135–145)
Sodium: 142 mEq/L (ref 135–145)

## 2010-06-14 LAB — COMPREHENSIVE METABOLIC PANEL
ALT: 14 U/L (ref 0–53)
Alkaline Phosphatase: 81 U/L (ref 39–117)
CO2: 22 mEq/L (ref 19–32)
GFR calc non Af Amer: >60 mL/min (ref >60)
Glucose, Bld: 97 mg/dL (ref 70–99)
Potassium: 4.1 mEq/L (ref 3.5–5.1)
Sodium: 139 mEq/L (ref 135–145)
Total Bilirubin: 0.3 mg/dL (ref 0.3–1.2)

## 2010-06-14 LAB — BASIC METABOLIC PANEL
BUN: 13 mg/dL (ref 6–23)
CO2: 23 mEq/L (ref 19–32)
CO2: 25 mEq/L (ref 19–32)
CO2: 26 mEq/L (ref 19–32)
Calcium: 7.9 mg/dL — ABNORMAL LOW (ref 8.4–10.5)
Calcium: 8 mg/dL — ABNORMAL LOW (ref 8.4–10.5)
Calcium: 8.3 mg/dL — ABNORMAL LOW (ref 8.4–10.5)
Chloride: 103 mEq/L (ref 96–112)
Creatinine, Ser: 0.94 mg/dL (ref 0.4–1.5)
Creatinine, Ser: 1 mg/dL (ref 0.4–1.5)
Creatinine, Ser: 1.09 mg/dL (ref 0.4–1.5)
GFR calc Af Amer: >60 mL/min (ref >60)
GFR calc non Af Amer: >60 mL/min (ref >60)
GFR calc non Af Amer: >60 mL/min (ref >60)
Glucose, Bld: 124 mg/dL — ABNORMAL HIGH (ref 70–99)
Glucose, Bld: 148 mg/dL — ABNORMAL HIGH (ref 70–99)
Sodium: 137 mEq/L (ref 135–145)

## 2010-06-14 LAB — HEMOGLOBIN AND HEMATOCRIT, BLOOD: HCT: 23.7 % — ABNORMAL LOW (ref 39.0–52.0)

## 2010-06-14 LAB — CK TOTAL AND CKMB (NOT AT ARMC)
CK, MB: 8.6 ng/mL (ref 0.3–4.0)
Relative Index: 2.7 — ABNORMAL HIGH (ref 0.0–2.5)
Relative Index: INVALID (ref 0.0–2.5)
Total CK: 315 U/L — ABNORMAL HIGH (ref 7–232)

## 2010-06-14 LAB — POCT I-STAT 3, ART BLOOD GAS (G3+)
Bicarbonate: 21.3 mEq/L (ref 20.0–24.0)
O2 Saturation: 100 %
Patient temperature: 36.3
TCO2: 23 mmol/L (ref 0–100)
TCO2: 24 mmol/L (ref 0–100)
pCO2 arterial: 41 mmHg (ref 35.0–45.0)
pCO2 arterial: 42.7 mmHg (ref 35.0–45.0)
pCO2 arterial: 45 mmHg (ref 35.0–45.0)
pH, Arterial: 7.309 — ABNORMAL LOW (ref 7.350–7.450)
pH, Arterial: 7.327 — ABNORMAL LOW (ref 7.350–7.450)
pO2, Arterial: 98 mmHg (ref 80.0–100.0)

## 2010-06-14 LAB — PROTIME-INR
INR: 1.41 (ref 0.00–1.49)
Prothrombin Time: 17.1 seconds — ABNORMAL HIGH (ref 11.6–15.2)

## 2010-06-14 LAB — BLOOD GAS, ARTERIAL
FIO2: 0.21 %
O2 Saturation: 96.8 %
Patient temperature: 98.6
TCO2: 23.3 mmol/L (ref 0–100)
pH, Arterial: 7.439 (ref 7.350–7.450)

## 2010-06-14 LAB — CREATININE, SERUM
Creatinine, Ser: 0.98 mg/dL (ref 0.4–1.5)
GFR calc Af Amer: >60 mL/min (ref >60)
GFR calc non Af Amer: >60 mL/min (ref >60)

## 2010-06-14 LAB — POCT I-STAT, CHEM 8
Creatinine, Ser: 1 mg/dL (ref 0.4–1.5)
Glucose, Bld: 139 mg/dL — ABNORMAL HIGH (ref 70–99)
HCT: 34 % — ABNORMAL LOW (ref 39.0–52.0)
Hemoglobin: 11.6 g/dL — ABNORMAL LOW (ref 13.0–17.0)
Potassium: 4.1 mEq/L (ref 3.5–5.1)
Sodium: 140 mEq/L (ref 135–145)
TCO2: 22 mmol/L (ref 0–100)

## 2010-06-14 LAB — TYPE AND SCREEN
ABO/RH(D): O POS
Antibody Screen: NEGATIVE

## 2010-06-14 LAB — GLUCOSE, CAPILLARY
Glucose-Capillary: 111 mg/dL — ABNORMAL HIGH (ref 70–99)
Glucose-Capillary: 135 mg/dL — ABNORMAL HIGH (ref 70–99)
Glucose-Capillary: 145 mg/dL — ABNORMAL HIGH (ref 70–99)

## 2010-06-14 LAB — TROPONIN I: Troponin I: 0.01 ng/mL (ref 0.00–0.06)

## 2010-06-14 LAB — APTT: aPTT: 38 seconds — ABNORMAL HIGH (ref 24–37)

## 2010-06-14 LAB — PLATELET COUNT: Platelets: 173 10*3/uL (ref 150–400)

## 2010-06-14 LAB — MRSA PCR SCREENING: MRSA by PCR: NEGATIVE

## 2010-06-15 LAB — GLUCOSE, CAPILLARY: Glucose-Capillary: 102 mg/dL — ABNORMAL HIGH (ref 70–99)

## 2010-06-18 NOTE — Letter (Signed)
Summary: CANCER CENTER NOTE  CANCER CENTER NOTE   Imported By: Faythe Ghee 06/08/2010 12:02:18  _____________________________________________________________________  External Attachment:    Type:   Image     Comment:   External Document

## 2010-06-24 LAB — CEA: CEA: 4.7 ng/mL (ref 0.0–5.0)

## 2010-06-27 LAB — CEA: CEA: 4.7 ng/mL (ref 0.0–5.0)

## 2010-07-02 LAB — COMPREHENSIVE METABOLIC PANEL
BUN: 17 mg/dL (ref 6–23)
CO2: 23 mEq/L (ref 19–32)
Calcium: 9.1 mg/dL (ref 8.4–10.5)
Chloride: 110 mEq/L (ref 96–112)
Creatinine, Ser: 1.31 mg/dL (ref 0.4–1.5)
GFR calc non Af Amer: 58 mL/min — ABNORMAL LOW (ref >60)
Total Bilirubin: 0.2 mg/dL — ABNORMAL LOW (ref 0.3–1.2)

## 2010-07-02 LAB — CEA: CEA: 5.4 ng/mL — ABNORMAL HIGH (ref 0.0–5.0)

## 2010-07-20 ENCOUNTER — Encounter (HOSPITAL_COMMUNITY): Payer: Managed Care, Other (non HMO) | Attending: Oncology

## 2010-07-20 DIAGNOSIS — C189 Malignant neoplasm of colon, unspecified: Secondary | ICD-10-CM

## 2010-07-20 DIAGNOSIS — C7800 Secondary malignant neoplasm of unspecified lung: Secondary | ICD-10-CM | POA: Insufficient documentation

## 2010-07-20 DIAGNOSIS — Z85048 Personal history of other malignant neoplasm of rectum, rectosigmoid junction, and anus: Secondary | ICD-10-CM | POA: Insufficient documentation

## 2010-07-20 DIAGNOSIS — Z452 Encounter for adjustment and management of vascular access device: Secondary | ICD-10-CM

## 2010-07-20 DIAGNOSIS — Z79899 Other long term (current) drug therapy: Secondary | ICD-10-CM | POA: Insufficient documentation

## 2010-07-20 DIAGNOSIS — C801 Malignant (primary) neoplasm, unspecified: Secondary | ICD-10-CM

## 2010-08-03 ENCOUNTER — Ambulatory Visit (HOSPITAL_COMMUNITY)
Admission: RE | Admit: 2010-08-03 | Discharge: 2010-08-03 | Disposition: A | Discharge status: Home / Self Care | Payer: Managed Care, Other (non HMO) | Source: Ambulatory Visit | Attending: Oncology | Admitting: Oncology

## 2010-08-03 DIAGNOSIS — C189 Malignant neoplasm of colon, unspecified: Secondary | ICD-10-CM | POA: Insufficient documentation

## 2010-08-03 DIAGNOSIS — N200 Calculus of kidney: Secondary | ICD-10-CM | POA: Insufficient documentation

## 2010-08-03 DIAGNOSIS — C7800 Secondary malignant neoplasm of unspecified lung: Secondary | ICD-10-CM | POA: Insufficient documentation

## 2010-08-03 MED ORDER — IOHEXOL 300 MG/ML  SOLN
100.0000 mL | Freq: Once | INTRAMUSCULAR | Status: AC | PRN
  Administered 2010-08-03: 100 mL via INTRAVENOUS

## 2010-08-04 NOTE — Op Note (Signed)
NAMEWEILAND, TOMICH                  ACCOUNT NO.:  [id number]   MEDICAL RECORD NO.:  [id number]          PATIENT TYPE:  AMB   LOCATION:  DAY                           FACILITY:  [facility]   PHYSICIAN:  Dalia Heading, M.D.  DATE OF BIRTH:  Jun 25, 1955   DATE OF PROCEDURE:  08/17/2006  DATE OF DISCHARGE:                               OPERATIVE REPORT   PREOPERATIVE DIAGNOSIS:  Perineal wound infection.   POSTOPERATIVE DIAGNOSIS:  Perineal wound infection.   PROCEDURE:  Debridement of perineal wound.   SURGEON:  Dalia Heading, MD   ANESTHESIA:  General.   INDICATIONS:  The patient is a 55 year old white male status post an  abdominoperineal resection, who has continued to have a draining sinus  from the superior portion of the perineal surgical wound site.  The  patient now comes the operating room for debridement of the wound.  The  risks and benefits of the procedure including bleeding, infection, and  recurrence of the drainage, were fully explained to the patient, who  gave informed consent.   PROCEDURE NOTE:  The patient was placed in the lithotomy position after  general anesthesia was administered.  The perineum was prepped and  draped using the usual sterile technique with Betadine.  Surgical site  confirmation was performed.   The sinus was opened and a small subcutaneous pocket was found.  This  was curetted and irrigated.  Any bleeding was controlled using Bovie  electrocautery.  Anaerobic and aerobic cultures were taken, though there  was a minimal amount of purulent fluid.  The subcutaneous layer was  reapproximated using 3-0 Vicryl interrupted sutures.  The skin was  closed using 3-0 Prolene vertical mattress sutures.  Sensorcaine 0.5%  was instilled in the surrounding wound.  Betadine ointment and a dry  sterile dressing were applied.   All tape and needle counts were correct at the end of the procedure.  The patient was awakened and transferred to PACU in stable  condition.   COMPLICATIONS:  None.   SPECIMEN:  Cultures, perineal wound.   BLOOD LOSS:  Minimal.      Dalia Heading, M.D.  Electronically Signed     MAJ/MEDQ  D:  08/17/2006  T:  08/17/2006  Job:  [id number]   cc:   Ladona Horns. Mariel Sleet, MD  Fax: [phone]

## 2010-08-04 NOTE — H&P (Signed)
Martin Maldonado, Martin Maldonado                  ACCOUNT NO.:  [id number]   MEDICAL RECORD NO.:  [id number]          PATIENT TYPE:  AMB   LOCATION:                                FACILITY:  [facility]   PHYSICIAN:  Dalia Heading, M.D.  DATE OF BIRTH:  1955/06/17   DATE OF ADMISSION:  DATE OF DISCHARGE:  LH                              HISTORY & PHYSICAL   CHIEF COMPLAINT:  Perineal wound infection.   HISTORY OF PRESENT ILLNESS:  The patient is a 55 year old white male  status post an abdominoperineal resection in February 2008, who now  presents with a perineal wound infection.  He has undergone some  chemotherapy and is getting ready to undergo radiation therapy.   PAST MEDICAL HISTORY:  Rectal cancer.   PAST SURGICAL HISTORY:  As noted above.   CURRENT MEDICATIONS:  None.   ALLERGIES:  No known drug allergies.   REVIEW OF SYSTEMS:  Noncontributory.  The patient does smoke a pack of  cigarettes a day.  He denies any significant alcohol use.  He denies any  other cardiopulmonary difficulties or bleeding disorders.   PHYSICAL EXAMINATION:  GENERAL:  The patient is a well-developed, well-  nourished white male in no acute distress.  LUNGS:  Clear to auscultation with equal breath sounds bilaterally.  HEART:  Regular rate and rhythm without S3, S4, or murmurs.  RECTAL:  An indurated, draining sinus along the perineal incision.  MRI  of the pelvis confirms a deeper pocket of necrotic tissue present.   IMPRESSION:  Perineal wound infection.   PLAN:  The patient is scheduled for a debridement of the perineal wound  on Aug 17, 2006.  The risks and benefits of the procedure were fully  explained to the patient, who gave informed consent.      Dalia Heading, M.D.  Electronically Signed     MAJ/MEDQ  D:  08/11/2006  T:  08/11/2006  Job:  [id number]   cc:   [facility]  Fax: [phone]   Ladona Horns. Mariel Sleet, MD  Fax: [phone]

## 2010-08-04 NOTE — Assessment & Plan Note (Signed)
OFFICE VISIT   Martin Maldonado, [name]  DOB:  1955-04-28                                        July 01, 2009  CHART #:  [id number]   REASON FOR VISIT:  Follow up after recent bypass surgery.   HISTORY OF PRESENT ILLNESS:  The patient is a 55 year old gentleman with  a history of colon cancer.  He recently is being worked up for a  possible metastatic disease.  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  He was taken urgently to the operating  room for coronary bypass grafting x2 with the mammary to his LAD and a  saphenous vein to his obtuse marginal.  While there, we did a true cut  needle biopsy of left upper lobe mass that was fairly central in  location.  Postoperatively, his course was uncomplicated and he was  discharged home on postoperative day #6.  He says he has done well since  he was discharged home.  He has had some nausea, says usually it is  after he takes his medications in the mornings but it gets better  through the course of the day.  He has not had any real significant  weight loss.  He is having minimal chest discomfort.  He is not having  to take pain medications at all.  He does feel an achiness there but no  significant pain, any problems with swelling in his leg, shortness of  breath or cough.   CURRENT MEDICATIONS:  1. Guaifenesin 1200 mg b.i.d.  2. Lisinopril 10 mg daily.  3. Metoprolol 50 mg b.i.d.  4. Crestor 20 mg at bedtime.  5. Aspirin 325 mg daily.  6. He also takes a multivitamin daily.  7. He has a prescription for hydrocodone but has not been taking it.   His Percocet was discontinued.   PHYSICAL EXAMINATION:  The patient is a 55 year old gentleman in no  acute distress.  His sternal wound is well healed.  The sternum is  stable.  His leg incisions are well healed.  There is no peripheral  edema.  His lungs are clear with  equal breath sounds bilaterally.   LABORATORY DATA:  Chest x-ray shows good aeration of the lungs  bilaterally.  There are no effusions or infiltrates.  None of his lung  nodules are particularly notable on the CT scan.   Pathology was confirmed as metastatic colorectal cancer.   IMPRESSION:  The patient is a 55 year old gentleman who underwent  emergent coronary bypass grafting for tight left main disease.  He has  done extremely well from a surgical standpoint.  His exercise tolerance  is good.  He is anxious to increase his activities.  His pain is  minimal.  His incisions are healing well.  He has had an appointment  with Dr. Mariel Sleet.  They have scheduled chemotherapy.  He is going to  get a Port-A-Cath on Friday and then have chemotherapy starting the week  after Easter.  He has not seen a cardiologist since his discharge.  We  are going to get him an appointment with the [facility] practice up in  [place].  He is still not to lift any objects of weight greater than 10  pounds for another 3 weeks.  He may begin driving.  Appropriate  precautions were discussed.  I encouraged him to build into his  activities gradually.  I would be happy to see him back anytime if I can  be of any further assistance with his care.   Salvatore Decent Dorris Fetch, M.D.  Electronically Signed   SCH/MEDQ  D:  07/01/2009  T:  07/02/2009  Job:  [id number]   cc:   Gerrit Friends. Dietrich Pates, MD, [facility]  Rollene Rotunda, MD, [facility]  Ladona Horns. Mariel Sleet, MD  Kirstie Peri, MD

## 2010-08-04 NOTE — Op Note (Signed)
Martin Maldonado, Martin Maldonado                  ACCOUNT NO.:  [id number]   MEDICAL RECORD NO.:  [id number]          PATIENT TYPE:  AMB   LOCATION:  DAY                           FACILITY:  [facility]   PHYSICIAN:  Dalia Heading, M.D.  DATE OF BIRTH:  1955-11-03   DATE OF PROCEDURE:  06/19/2008  DATE OF DISCHARGE:                               OPERATIVE REPORT   PREOPERATIVE DIAGNOSIS:  Rectal carcinoma, finished with chemotherapy.   POSTOPERATIVE DIAGNOSIS:  Rectal carcinoma, finished with chemotherapy.   PROCEDURE:  Port-A-Cath removal.   SURGEON:  Dalia Heading, MD   ANESTHESIA:  Local.   INDICATIONS:  The patient is a 55 year old white male who presents for  Port-A-Cath removal.  He has finished with chemotherapy.  The risks and  benefits of the procedure were fully explained to the patient, gave  informed consent.   PROCEDURE NOTE:  The patient was placed in the supine position.  The  left upper chest was prepped and draped using the usual sterile  technique with Betadine.  Surgical site confirmation was performed.  Xylocaine 1% was used for local anesthesia.   An incision was made through the previous surgical scar which was just  over the Port-A-Cath.  The dissection was taken down to the port.  The  port was removed in total without difficulty.  It was flushed on the  table and there were no problems with flushing it.  It was disposed off.  The skin was reapproximated using 4-0 Vicryl subcuticular suture.  Dermabond was then applied.   All tape and needle counts were correct at the end of the procedure.  As  previously stated, 1% Xylocaine was used for local anesthesia.  He  tolerated the procedure well.   The patient was discharged in good and stable condition.   COMPLICATIONS:  None.   SPECIMEN:  Port-A-Cath, disposed off.   ESTIMATED BLOOD LOSS:  Minimal.      Dalia Heading, M.D.  Electronically Signed     MAJ/MEDQ  D:  06/19/2008  T:  06/19/2008  Job:   [id number]   cc:   Ladona Horns. Mariel Sleet, MD  Fax: [phone]

## 2010-08-04 NOTE — H&P (Signed)
Martin Maldonado, Martin Maldonado                  ACCOUNT NO.:  [id number]   MEDICAL RECORD NO.:  [id number]          PATIENT TYPE:  EMS   LOCATION:  ED                            FACILITY:  [facility]   PHYSICIAN:  Dalia Heading, M.D.  DATE OF BIRTH:  [birth year]   DATE OF ADMISSION:  04/29/2007  DATE OF DISCHARGE:  LH                              HISTORY & PHYSICAL   CHIEF COMPLAINT:  Nausea and vomiting.   HISTORY OF PRESENT ILLNESS:  The patient is a 55 year old white male  status post abdominoperineal resection in February 2008 for a rectal  carcinoma who now presents with nausea and vomiting over the last 24  hours.  He states his colostomy was working well until today when he has  not had much output.  He did receive chemotherapy as well as radiation  therapy in September 2008.  He does have abdominal swelling and  intermittent pain.   PAST MEDICAL HISTORY:  1. Rectal cancer.  2. Hypertension.   PAST SURGICAL HISTORY:  As noted above   CURRENT MEDICATIONS:  1. Metoprolol 25 mg p.o. b.i.d.  2. Tylenol p.r.n.   ALLERGIES:  No known drug allergies.   REVIEW OF SYSTEMS:  The systems the patient does smoke.  He denies any  recent alcohol use.  Denies any fever, chills, MI, CVA, diabetes  mellitus.   PHYSICAL EXAMINATION:  GENERAL:  The patient is a well-developed, well-  nourished white male in no acute distress.  VITAL SIGNS:  He is afebrile and vital signs stable.  LUNGS:  Clear to auscultation with equal breath sounds bilaterally.  HEART:  Examination reveals regular rate and rhythm without history, S4,  murmurs.  ABDOMEN:  Soft and mildly distended.  The colostomy is present in the  left lower quadrant of the abdomen.  No hepatosplenomegaly or masses are  noted.  No hernias are noted.  The perineum is intact.   LABORATORY DATA:  Met-7 is within normal limits.  CBC reveals a white  blood cell count 8.6, hematocrit 43.6, platelet count 263.  His last CEA  level in January of 2009 was  3.5.   CT scan of the pelvis reveals dilated small bowel loops with air-fluid  levels, compatible of a small bowel obstruction.  There did not appear  to be a transition point, but the distal small bowel loops are somewhat  decompressed and appeared thickened.  This correlates with enteritis.  No other abnormalities or adenopathy is noted.  There is a question of a  4 mm left lower lobe nodule which was not definitely imaged on prior  studies.  CT scan of the chest is recommended in 3-6 months.  The  patient notes that he is due for a PET scan in March 2009.   IMPRESSION:  Nausea and vomiting, question small bowel obstruction due  to adhesive disease or enteritis secondary to radiation therapy.   PLAN:  The patient will be admitted to hospital for further evaluation  treatment.  The nasogastric tube has been placed  and approximately 600  mL of fluid has  been obtained.  I will try to manage this conservatively  without surgical intervention if it could be avoided.  This has been  explained to the family who agrees to the treatment plan.      Dalia Heading, M.D.  Electronically Signed     MAJ/MEDQ  D:  04/29/2007  T:  04/30/2007  Job:  [id number]   cc:   Ladona Horns. Mariel Sleet, MD  Fax: [phone]

## 2010-08-04 NOTE — Op Note (Signed)
Martin Maldonado, Martin Maldonado                  ACCOUNT NO.:  [id number]   MEDICAL RECORD NO.:  [id number]          PATIENT TYPE:  INP   LOCATION:  A310                          FACILITY:  [facility]   PHYSICIAN:  Dalia Heading, M.D.  DATE OF BIRTH:  [birth year]   DATE OF PROCEDURE:  05/08/2007  DATE OF DISCHARGE:                               OPERATIVE REPORT   PREOPERATIVE DIAGNOSES:  1. Small-bowel obstruction.  2. History of rectal carcinoma.   POSTOPERATIVE DIAGNOSES:  1. Small-bowel obstruction.  2. History of rectal carcinoma.   PROCEDURES:  1. Exploratory laparotomy.  2. Lysis of adhesions.  3. Incidental appendectomy.   SURGEON:  Dr. Franky Macho.   ANESTHESIA:  General endotracheal.   INDICATIONS:  The patient is a 55 year old white male status post an  abdominoperineal resection in 2008 for rectal carcinoma who now presents  with a small-bowel obstruction.  Despite conservative treatment, he has  failed decompressive therapy.  The patient now comes to the operating  room for an exploratory laparotomy.  Risks and benefits of the procedure  including bleeding, infection, cardiopulmonary difficulties, and the  possibility of a bowel resection were fully explained to the patient,  gave informed consent.   PROCEDURE NOTE:  The patient was placed in supine position.  After  induction of general endotracheal anesthesia, the abdomen was prepped  and draped in the usual sterile technique with Betadine.  Surgical site  confirmation was performed.  An Iodoform drape was used as the patient  had a colostomy.   A midline incision was made from just above the umbilicus to the  suprapubic region.  The peroneal cavity was entered into with moderate  difficulty due to adhesive disease and small bowel loops adhesed to the  abdominal wall.  These were all lysed sharply without difficulty.  All  adhesions were lysed from the ligament of Treitz to the terminal ileum.  Multiple proximal small bowel  loops were noted to be dilated.  It ended  up that patient had two small bowel loops that were adhesed into the  pelvis.  The cecum was also adhesed into the pelvis.  The appendix was  noted to be present and elongated.  Due to the patient's previous  radiation therapy as well as the difficulty on entering the abdominal  cavity, it was elected to proceed with an incidental appendectomy.  The  mesoappendix was divided using Bovie electrocautery.  A TA-30 stapler  was placed across the base of the appendix and fired.  The appendix was  then amputated and removed from the operative field.  The mucosal edges  was cauterized using Bovie electrocautery.  The stump was then inverted  using 3-0 silk sutures.  The bowel was then milked from distal to  proximal in order to remove intraluminal air and fluid.  This was  evacuated through the nasogastric tube.  A small serosal tear was seen  in the midportion of the small bowel and this was repaired using 3-0  silk Lembert sutures.  The bowel was then returned to the abdominal  cavity in orderly  fashion.  The abdominal cavity was copiously irrigated  with normal saline.  Surgicel and Gelfoam were placed along the pelvis  and then overlying this, Interceed was placed.  There was no evidence of  recurrent cancer.  Both the left and right ureters were identified and  noted to be intact.   The fascia was reapproximated using a looped 0 Novofil running suture.  The subcutaneous layer was irrigated with normal saline and the skin was  closed using staples.  Betadine ointment and dry sterile dressings were  applied.   All tape and needle counts correct at the end of the procedure.  The  patient was extubated in the operating room and went back to recovery  room awake in stable condition.   COMPLICATIONS:  None.   SPECIMEN:  Appendix.   BLOOD LOSS:  250 mL.      Dalia Heading, M.D.  Electronically Signed     MAJ/MEDQ  D:  05/08/2007  T:   05/09/2007  Job:  [id number]   cc:   Ladona Horns. Mariel Sleet, MD  Fax: [phone]

## 2010-08-04 NOTE — Discharge Summary (Signed)
Martin Maldonado, Martin Maldonado                  ACCOUNT NO.:  [id number]   MEDICAL RECORD NO.:  [id number]          PATIENT TYPE:  INP   LOCATION:  A310                          FACILITY:  [facility]   PHYSICIAN:  Dalia Heading, M.D.  DATE OF BIRTH:  06/16/1955   DATE OF ADMISSION:  04/29/2007  DATE OF DISCHARGE:  03/04/2009LH                               DISCHARGE SUMMARY   HOSPITAL COURSE SUMMARY:  The patient is a 55 year old white male status  post an abdominoperineal resection 1 year ago for rectal carcinoma who  presented to the emergency room with worsening nausea and vomiting.  CT  scan of the abdomen and pelvis revealed a small-bowel obstruction.  He  did receive radiation and chemotherapy after his original surgery which  ended in September 2008.  He was admitted to the hospital, and a  nasogastric tube was placed.  An attempt was made to correct this bowel  obstruction with conservative means, but this ultimately failed.  He  subsequently was taken to the operating room on May 08, 2007, and  underwent exploratory laparotomy, lysis of adhesions, and incidental  appendectomy.  He had significant adhesions of the small bowel into the  pelvis from his previous surgeries as well as from his radiation  therapy.  He tolerated the procedure well.  His postoperative course was  remarkable for a postoperative ileus.  He subsequently developed a wound  infection which was culture positive for E-coli.  While this was being  treated, his white blood cell count dropped but then subsequently rose,  and he had worsening lower abdominal pain.  A CT scan of the abdomen and  pelvis was performed, and an abscess was found in the suprapubic region  underneath the incision.  Under CT guidance, a pigtail catheter was  placed with subsequent resolution of the abscess.  He was on Tygacil as  well as Diflucan during this period.  He also had a right lower lobe  pneumonia which was also treated with antibiotics  and Xopenex nebulizer  treatments.  With his wound been open and close to his colostomy, a  wound VAC was placed.  He has defervesced well as white blood cell count  has returned to normal.  He is being discharged home on May 24, 2007,  in fair and stable condition.   DISCHARGE INSTRUCTIONS:  Home health will be changing his wound VAC  every Monday, Wednesday, and Friday as well as monitoring the drainage  output from the abscess.   FOLLOW UP:  The patient is to follow up Dr. Franky Macho on May 30, 2007.   DISCHARGE MEDICATIONS:  1. Metoprolol 50 mg p.o. daily.  2. Multivitamin 1 tablet p.o. daily.  3. Ciprofloxacin 500 mg p.o. b.i.d. x1 week.  4. Diflucan 100 mg p.o. daily x1 week.  5. Percocet 7.5/325 mg 1-2 tablets p.o. q.4 h p.r.n. pain.  6. Phenergan 25 mg p.o. q.6 h p.r.n. nausea.  7. Prevacid 30 mg p.o. daily.  8. Fentanyl patch 75 mcg to be applied to the skin q. 3 days.   PRINCIPAL  DIAGNOSES:  1. History of rectal carcinoma.  2. Small bowel obstruction.  3. Intra-abdominal abscess.  4. Wound infection.  5. Right lower lobe pneumonia.  6. Hypertension.   PRINCIPAL PROCEDURE:  Exploratory laparotomy, lysis of adhesions,  incidental appendectomy on May 08, 2007.      Dalia Heading, M.D.  Electronically Signed     MAJ/MEDQ  D:  05/24/2007  T:  05/24/2007  Job:  [id number]   cc:   Ladona Horns. Mariel Sleet, MD  Fax: [phone]

## 2010-08-05 ENCOUNTER — Other Ambulatory Visit (HOSPITAL_COMMUNITY): Payer: Self-pay | Admitting: Oncology

## 2010-08-05 ENCOUNTER — Encounter (HOSPITAL_COMMUNITY): Payer: Managed Care, Other (non HMO) | Attending: Oncology | Admitting: Oncology

## 2010-08-05 DIAGNOSIS — C801 Malignant (primary) neoplasm, unspecified: Secondary | ICD-10-CM

## 2010-08-05 DIAGNOSIS — Z79899 Other long term (current) drug therapy: Secondary | ICD-10-CM | POA: Insufficient documentation

## 2010-08-05 DIAGNOSIS — Z85048 Personal history of other malignant neoplasm of rectum, rectosigmoid junction, and anus: Secondary | ICD-10-CM | POA: Insufficient documentation

## 2010-08-05 DIAGNOSIS — C7800 Secondary malignant neoplasm of unspecified lung: Secondary | ICD-10-CM | POA: Insufficient documentation

## 2010-08-05 DIAGNOSIS — C19 Malignant neoplasm of rectosigmoid junction: Secondary | ICD-10-CM

## 2010-08-05 LAB — COMPREHENSIVE METABOLIC PANEL
ALT: 16 U/L (ref 0–53)
Albumin: 4.3 g/dL (ref 3.5–5.2)
Alkaline Phosphatase: 90 U/L (ref 39–117)
Potassium: 4.8 mEq/L (ref 3.5–5.1)
Sodium: 141 mEq/L (ref 135–145)
Total Protein: 7.3 g/dL (ref 6.0–8.3)

## 2010-08-05 LAB — DIFFERENTIAL
Basophils Absolute: 0.1 10*3/uL (ref 0.0–0.1)
Basophils Relative: 1 % (ref 0–1)
Neutro Abs: 6.3 10*3/uL (ref 1.7–7.7)
Neutrophils Relative %: 71 % (ref 43–77)

## 2010-08-05 LAB — CBC
Hemoglobin: 14.2 g/dL (ref 13.0–17.0)
MCHC: 33 g/dL (ref 30.0–36.0)
RDW: 13.8 % (ref 11.5–15.5)

## 2010-08-07 NOTE — Op Note (Signed)
NAMECHRISTINE, SCHIEFELBEIN                  ACCOUNT NO.:  [id number]   MEDICAL RECORD NO.:  [id number]          PATIENT TYPE:  INP   LOCATION:  IC03                          FACILITY:  [facility]   PHYSICIAN:  Barbaraann Barthel, M.D. DATE OF BIRTH:  1955-06-19   DATE OF PROCEDURE:  05/09/2006  DATE OF DISCHARGE:                               OPERATIVE REPORT   This is the perineal portion of the abdominal perineal resection  performed by Dr. Lovell Sheehan and Dr. Malvin Johns on May 09, 2006.   I neglected to give you the medical record, it is [id number].  You can  find this dictation immediately following the dictation of [facility],  medical record [phone].  This was dictated on May 09, 2006, at  approximately 2 p.m.      Barbaraann Barthel, M.D.  Electronically Signed     WB/MEDQ  D:  05/09/2006  T:  05/09/2006  Job:  [id number]

## 2010-08-07 NOTE — Discharge Summary (Signed)
NAMEADYN, Martin Maldonado                  ACCOUNT NO.:  [id number]   MEDICAL RECORD NO.:  [id number]          PATIENT TYPE:  INP   LOCATION:  A338                          FACILITY:  [facility]   PHYSICIAN:  Dalia Heading, M.D.  DATE OF BIRTH:  [birth year]   DATE OF ADMISSION:  05/09/2006  DATE OF DISCHARGE:  02/26/2008LH                               DISCHARGE SUMMARY   HOSPITAL COURSE:  The patient is a 55 year old white male who was found  on colonoscopy to have a rectal carcinoma.  Is him in my office, and he  was scheduled for surgery.  He presented to [facility] on  May 09, 2006 and underwent an abdominoperineal resection.  He  initially tolerated the surgery well.  He did require 2 units of packed  red blood cells for anemia secondary to surgery.  He did also have some  difficulty voiding after the Foley catheter was removed, but this has  since resolved.  He had a mild postoperative ileus secondary to  gastroparesis, and this also subsequently resolved.  His diet was then  advanced without difficulty.  Final pathology revealed a rectal  adenocarcinoma.  1 out of 12 lymph nodes were positive, thus T3, N1, M0  stage.  He will be referred to Dr. Mariel Sleet for further evaluation and  treatment as an outpatient.  He has been instructed on colostomy care  use.  The patient is being discharged home on May 17, 2006 in good  and improving condition.   DISCHARGE INSTRUCTIONS:  The patient is to follow up with Dr. Franky Macho on May 24, 2006.   DISCHARGE MEDICATIONS:  1. Percocet 7.5/325 mg 1-2 tablets p.o. q.4h. p.r.n. pain.  2. Metoprolol 25 mg p.o. b.i.d.   The patient will be followed by home health for blood pressure check,  wound check, and colostomy care.   PRINCIPAL DIAGNOSES:  1. Rectal carcinoma, T3, N1, M0.  2. Hypertension.  3. Anemia.  4. Postoperative ileus, resolved   PRINCIPAL PROCEDURE:  Abdominoperineal resection on May 09, 2006.      Dalia Heading, M.D.  Electronically Signed     MAJ/MEDQ  D:  05/17/2006  T:  05/17/2006  Job:  [id number]   cc:   R. Roetta Sessions, M.D.  [id number]  [place]  [place] [address]   Ladona Horns. Mariel Sleet, MD  Fax: [phone]   Kirstie Peri, MD  Fax: [phone]

## 2010-08-07 NOTE — Op Note (Signed)
NAME:  Martin Maldonado, Martin Maldonado                  ACCOUNT NO.:  [id number]   MEDICAL RECORD NO.:  [id number]          PATIENT TYPE:  AMB   LOCATION:  DAY                           FACILITY:  [facility]   PHYSICIAN:  R. Roetta Sessions, M.D. DATE OF BIRTH:  1955/12/18   DATE OF PROCEDURE:  04/28/2006  DATE OF DISCHARGE:                               OPERATIVE REPORT   PROCEDURE IN DETAIL:  Diagnostic colonoscopy with snare polypectomy,  snare cautery, ablation, biopsy.   INDICATIONS FOR PROCEDURE:  The patient is 55 year old gentleman  referred over by Dr. Sherryll Burger in [place] to further evaluate basically a six-  week history of rectal bleeding.  There is no family history colon  cancer.  He has never had his lower GI tract evaluated.   He now comes for colonoscopy.  This approach has discussed with the  patient.  Potential risks, benefits and alternatives have been reviewed,  questions answered.  It is notable CBC from 04/26/2006 through my office  demonstrated completely normal CBC and sed rate with white count 7.7,  H&H 13.1 and 40.5, MCV 87.5, sed rate 13.   PROCEDURE NOTE:  O2 saturation, blood pressure, pulse and respirations  monitored throughout the entire procedure.   CONSCIOUS SEDATION:  Versed 6 mg IV, Demerol 125 mg IV in divided doses.   INSTRUMENT:  Pentax video chip system.   Digital rectal examination revealed a firm mass palpable on the tip of  the index finger.   ENDOSCOPIC FINDINGS:  The prep was good.   Examination of rectum revealed a fungating apple core neoplastic  appearing process which starts 5 cm from the anal verge.  It is  producing a significant encroachment on the lumen of the rectum.  It  took a while to find the residual lumen.  I was able to negotiate  through it with the adult scope.  This lesion was approximately 4 cm in  length.  I then was able to advance scope easily through the colon to  the cecum.  Cecum, ileocecal valve, appendiceal orifice were well seen  photographed for the record.  From this level scope slowly cautiously  withdrawn.  All previously mentioned mucosal surfaces were again seen.  The patient had numerous left colon polyps.  We had two 6-8 mm  pedunculated polyps in the descending colon which were removed with  snare cautery.  There a couple of diminutive polyps which were ablated  with the tip of the snare cautery loop.  In the sigmoid colon there was  a proximal pedunculated 8 mm polyp which was resected with hot snare  cautery and there was a 1.25 cm multilobulated pedunculated polyp in the  distal sigmoid just proximal to the apple core lesion which was also  removed with hot snare cautery, retrieved with a Roth net.  There were a  couple of diminutive polyps in the sigmoid which were ablated with snare  cautery.  The scope was pulled back down across the apple core into the  distal rectum where the two diminutive polyps between the apple core the  anal verge  were cold biopsied/ablated.  Subsequently using the Jumbo  biopsy forceps, multiple biopsies of the apple core lesion were taken  and large chunks were submitted to the pathologist.  The patient  tolerated the procedure well and was reacted in endoscopy.   IMPRESSION:  1. Diminutive distal rectal polyps ablated as described above.  2. Fungating apple core lesion consistent with colorectal cancer      starting at 5 cm from the anal verge.  Its length proximally was      approximately 4 cm producing significant encroachment on the lumen.      This lesion was biopsied multiple times.  This lesion was palpable      on digital rectal exam but there were multiple pedunculated polyps      in the sigmoid and descending colon, all of which were removed with      hot snare cautery, diminutive left colon polyps ablated with the      tip of the cautery loop.  The remainder of the colonic mucosa to      the cecum appeared normal.   RECOMMENDATIONS:  1. Surgery consultation.   2. I have discussed my findings via telephone with Dr. Sherryll Burger in [place]      today.  Will go ahead and get a Chem-20, baseline CEA and pursue an      abdominal pelvic CT scan.  Patient and family are to let me know in      terms of their preference on a surgeon in the very near future.      Jonathon Bellows, M.D.  Electronically Signed     RMR/MEDQ  D:  04/28/2006  T:  04/28/2006  Job:  [id number]   cc:   Kirstie Peri, MD  Fax: [phone]

## 2010-08-07 NOTE — Op Note (Signed)
NAMEHAKOP, HUMBARGER                  ACCOUNT NO.:  [id number]   MEDICAL RECORD NO.:  [id number]          PATIENT TYPE:  AMB   LOCATION:  DAY                           FACILITY:  [facility]   PHYSICIAN:  Dalia Heading, M.D.  DATE OF BIRTH:  08-01-1955   DATE OF PROCEDURE:  06/10/2006  DATE OF DISCHARGE:                               OPERATIVE REPORT   PREOPERATIVE DIAGNOSIS:  Rectal carcinoma, need for central venous  access.   POSTOPERATIVE DIAGNOSIS:  Rectal carcinoma, need for central venous  access.   PROCEDURE:  Port-A-Cath insertion.   SURGEON:  Dalia Heading, M.D.   ANESTHESIA:  MAC.   INDICATIONS:  The patient is a 55 year old white male with rectal  carcinoma who is about to undergo chemotherapy and needs central venous  access.  The risks and benefits of the procedure including bleeding,  infection, pneumothorax were fully explained to the patient, who gave  informed consent.   PROCEDURE NOTE:  The patient was placed in the Trendelenburg position  after the left upper chest was prepped and draped in the usual sterile  technique with Betadine.  Surgical site confirmation was performed.   One percent Xylocaine was used local anesthesia.  Transverse incision  was made below the left clavicle.  Subcutaneous pocket was then formed.  A needle was advanced into left subclavian vein using the Seldinger  technique without difficulty.  Guidewire was then advanced into the  right atrium under fluoroscopic guidance.  An introducer and peel-away  sheath were then placed over the guidewire.  The catheter was inserted  through the peel-away sheath and peel-away sheath was removed.  The  catheter was then attached to the port and the port placed in  subcutaneous pocket.  Adequate position was confirmed by fluoroscopy.  The port was flushed with 3000 units of heparin.  The subcutaneous layer  was reapproximated using a 3-0 Vicryl interrupted suture.  The skin was  closed using a 4-0  Vicryl subcuticular suture.  Dermabond was then  applied.   All tape and needle counts were correct at the end of the procedure.  The patient was transferred to PACU in stable condition.  Chest x-ray  will be performed at that time.   COMPLICATIONS:  None.   SPECIMEN:  None.   BLOOD LOSS:  Minimal.      Dalia Heading, M.D.  Electronically Signed     MAJ/MEDQ  D:  06/10/2006  T:  06/10/2006  Job:  [id number]   cc:   Kirstie Peri, MD  Fax: [phone]   Ladona Horns. Mariel Sleet, MD  Fax: [phone]

## 2010-08-07 NOTE — Op Note (Signed)
Martin Maldonado, Martin Maldonado                  ACCOUNT NO.:  [id number]   MEDICAL RECORD NO.:  [id number]          PATIENT TYPE:  INP   LOCATION:  IC03                          FACILITY:  [facility]   PHYSICIAN:  Dalia Heading, M.D.  DATE OF BIRTH:  1956/02/24   DATE OF PROCEDURE:  05/09/2006  DATE OF DISCHARGE:                               OPERATIVE REPORT   PREOPERATIVE DIAGNOSIS:  Rectal carcinoma.   POSTOPERATIVE DIAGNOSIS:  Rectal carcinoma.   PROCEDURE:  Abdominoperineal resection (proctectomy with permanent  colostomy).   SURGEON:  Dr. Franky Macho.   ASSISTANT:  Dr. Malvin Johns.   ANESTHESIA:  General endotracheal.   INDICATIONS:  The patient is a 55 year old white male who was found on  recent rectal examination and colonoscopy to have a rectal mass.  This  was biopsy confirmed positive for rectal carcinoma.  Preoperative CEA  level was 5.  CT scan of the abdomen and pelvis showed the tumorous mass  in the rectum.  Nonspecific less than 1 cm nodules were noted in the  right and left lobe of liver.  The patient now comes to the operating  for exploratory laparotomy, probable APR, possible low anterior  resection.  Risks and benefits of the procedure including bleeding,  infection, pain, possibility of a blood transfusion, possibility of  cardiopulmonary difficulties, and the strong possibility of a permanent  colostomy were fully explained to the patient, gave informed consent.   PROCEDURE NOTE:  The patient was placed in the lithotomy position after  induction of general endotracheal anesthesia.  The abdomen and perineum  were prepped and draped in the usual sterile technique with Betadine.  Surgical site confirmation was performed.   A midline incision was made from the umbilicus to the suprapubic region.  The peritoneal cavity was entered into without difficulty.  The  nasogastric tube was inspected and noted to be an appropriate position  in the stomach.  Both lobes of liver  were inspected and noted to be  within normal limits.  No masses were palpable.  The gallbladder, small  bowel, the ascending colon, transverse colon, descending colon, sigmoid  colon regions were all within normal limits.  No abnormal lesions were  noted.  The tumorous mass was noted beyond the peritoneal reflection in  the rectum.  This was not amendable to low anterior resection.  Thus, an  abdominoperineal resection was performed.  The sigmoid colon was  mobilized along its peritoneal reflection.  This was carried down on  either side of the rectum into the pelvis.  The peritoneal reflection  anterior was divided using the Harmonic scalpel.  A GIA stapler was  placed across the distal sigmoid colon and fired.  The inferior  mesenteric artery and vein were ligated and divided.  The dissection was  taken down into the pelvis.  The mesentery was divided using the  Harmonic scalpel.  Circumferential incision was made around the anus.  The anus was sutured shut with a single figure-of-eight silk suture.  The dissection was done both anteriorly and posteriorly until the rectum  was mobilized.  It was delivered through the perineum and removed from  the operative field.  Sent to pathology further examination.  Any  bleeding was controlled using clips and Bovie electrocautery.  The  sacrum and pelvis was copiously irrigated with gentamicin normal saline.  Both Jackson-Pratt drain was placed into the pelvis from the perineal  side as well as the anterior abdominal side.  Surgicel and Gelfoam were  placed into the pelvis.  The rectal sling musculature was reapproximated  using 2-0 Vicryl interrupted sutures.  The skin was closed using a 2-0  Prolene vertical mattress sutures.  The drain was secured in place using  a 3-0 nylon interrupted suture.  The cecum was reperitonealized using 2-  0 chromic gut running sutures.  A colostomy was then formed to the left  of the umbilicus.  The proximal colon  was brought through this region  without difficulty.  The fascia was reapproximated using a looped 0  Novofil running suture.  Subcutaneous layer was irrigated normal saline  and skin was closed using staples.  The colostomy was matured using a 3-  0 chromic gut interrupted suture.  Betadine ointment dry sterile  dressings were applied to both wounds.  The anterior abdominal wall  Jackson-Pratt drain was secured in place using a 3-0 nylon interrupted  suture.   All tape and needle counts correct at the end of procedure.  The patient  was extubated in the operating room and went back to recovery room awake  in guarded but stable condition.   COMPLICATIONS:  None.   SPECIMEN:  Rectum and anus.   BLOOD LOSS:  500 mL.   DRAINS:  Jackson-Pratt drains x2 to pelvis.      Dalia Heading, M.D.  Electronically Signed     MAJ/MEDQ  D:  05/09/2006  T:  05/09/2006  Job:  [id number]   cc:   Charlaine Dalton. Sherene Sires, MD, FCCP  [address]  [place] [address]   R. Roetta Sessions, M.D.  [address]  [place]  [place] [address]   Kirstie Peri, MD  Fax: [phone]

## 2010-08-07 NOTE — H&P (Signed)
NAME:  Martin Maldonado, Martin Maldonado                  ACCOUNT NO.:  [id number]   MEDICAL RECORD NO.:  [id number]          PATIENT TYPE:  AMB   LOCATION:  DAY                           FACILITY:  [facility]   PHYSICIAN:  Dalia Heading, M.D.  DATE OF BIRTH:  [birth year]   DATE OF ADMISSION:  DATE OF DISCHARGE:  LH                              HISTORY & PHYSICAL   CHIEF COMPLAINT:  Rectal carcinoma, need for central venous access.   HISTORY OF PRESENT ILLNESS:  The patient is a 56 year old white male  status post an abdominoperineal resection for a rectal carcinoma, now  presents for Port-A-Cath insertion.  He is about to undergo  chemotherapy.   PAST MEDICAL HISTORY:  Includes hypertension.   PAST SURGICAL HISTORY:  As noted above.   CURRENT MEDICATIONS:  Metoprolol 50 mg p.o. b.i.d.   ALLERGIES:  No known drug allergies.   REVIEW OF SYSTEMS:  As noted above or unremarkable.  The patient smokes  a pack and a half of cigarettes a day.  He denies any alcohol use.  He  denies any other cardiopulmonary difficulties or bleeding disorders.   On physical examination, the patient is a well-developed, well-nourished  white male in no acute distress.  LUNGS:  Clear to auscultation with equal breath sounds bilaterally.  HEART EXAMINATION:  Reveals a regular rate and rhythm without S3, S4, or  murmurs.  The abdomen is soft, nontender, nondistended.  A well-healed midline  surgical scar is noted.  The colostomy is present in the left side of  the abdomen.   IMPRESSION:  Rectal carcinoma, need for central venous access.   PLAN:  The patient is scheduled for Port-A-Cath insertion on June 10, 2006.  The risks and benefits of the procedure including bleeding,  infection, pneumothorax were fully explained to the patient, who gave  informed consent.      Dalia Heading, M.D.  Electronically Signed     MAJ/MEDQ  D:  06/07/2006  T:  06/08/2006  Job:  [id number]   cc:   Dalia Heading, M.D.  Fax:  [phone]   [facility]  Fax: [phone]   R. Roetta Sessions, M.D.  [address]  [place]  [place] [address]   Ladona Horns. Mariel Sleet, MD  Fax: [phone]   Kirstie Peri, MD  Fax: [phone]

## 2010-08-07 NOTE — Op Note (Signed)
Martin Maldonado, Martin Maldonado                  ACCOUNT NO.:  [id number]   MEDICAL RECORD NO.:  [id number]          PATIENT TYPE:  INP   LOCATION:  IC03                          FACILITY:  [facility]   PHYSICIAN:  Barbaraann Barthel, M.D. DATE OF BIRTH:  04-17-1955   DATE OF PROCEDURE:  05/09/2006  DATE OF DISCHARGE:                               OPERATIVE REPORT   PREOPERATIVE DIAGNOSIS:  Adenocarcinoma of the rectum.   POSTOPERATIVE DIAGNOSIS:  Adenocarcinoma of the rectum.   NOTE:  This is a 55 year old white male patient of Dr. Lovell Sheehan' who  presented to him with a circumferential lesion very low with a digital  examination who, on biopsy, was found to have adenocarcinoma.  He  discussed abdominal perineal resection with this patient and asked for  my help to do the perineal portion of the surgery.   Dr. Lovell Sheehan will be dictating the abdominal portion.   With the patient in stirrups, his perineum was prepped and draped in the  usual manner using Betadine.  Then, using a 2-0 silk suture, we put a  purse-string suture around the anus and made an elliptical incision  around the anus going through the skin, subcutaneous tissue, and  dividing this with the cautery device.  We then cut the anal coccygeal  raphe and elevated a posterior plane in this manner and laterally  divided the inferior hemorrhoidal vessels and cauterized through the  levator muscles puborectalis muscles using the cautery device.  We then  developed planes both laterally and posteriorly and then, with careful  dissection staying close to the rectum, divided the transverse perineal  muscles carefully avoiding any damage to the prostate.  Dr. Lovell Sheehan then  had divided the sigmoid colon and inverted the rectosigmoid posteriorly  into the sacrum which helped me to develop the plane anteriorly further.  We then removed the specimen in the rectum using the cautery device and  sharp dissection without problems.  We checked to make sure  hemostasis  was complete.  Dr. Lovell Sheehan irrigated above with a gentamicin saline  solution and I irrigated below with the saline solution.  I approximated  the levator muscles with 0 Polysorb sutures.  I placed a Jackson-Pratt  drain through a separate stab wound incision into the sacral hollow and  then closed the skin vertically using mattress sutures of 3-0 nylon.  Prior to closure, all sponge, needle, and instrument counts were found  to be correct.  We lost probably 100 to 150 mL on my portion of the  dissection.  There were no complications.  For other details, consult  Dr. Lovell Sheehan' notes.  Dr. Lovell Sheehan will continue to follow this patient of  his.      Barbaraann Barthel, M.D.  Electronically Signed    WB/MEDQ  D:  05/09/2006  T:  05/10/2006  Job:  [id number]   cc:   Dalia Heading, M.D.  Fax: [phone]

## 2010-08-07 NOTE — H&P (Signed)
NAMEKEVANTE, Martin Maldonado                  ACCOUNT NO.:  [id number]   MEDICAL RECORD NO.:  [id number]          PATIENT TYPE:  AMB   LOCATION:  DAY                           FACILITY:  [facility]   PHYSICIAN:  Dalia Heading, M.D.  DATE OF BIRTH:  03-21-1956   DATE OF ADMISSION:  DATE OF DISCHARGE:  LH                              HISTORY & PHYSICAL   CHIEF COMPLAINT:  Rectal carcinoma.   HISTORY OF PRESENT ILLNESS:  The patient is a 55 year old white male who  is referred for evaluation and treatment of a rectal carcinoma.  This  was found on colonoscopy for rectal bleeding.  Denies any family history  of colon carcinoma.  Denies any significant constipation, melena, weight  loss.   PAST MEDICAL HISTORY:  Is unremarkable.   PAST SURGICAL HISTORY:  Unremarkable.   CURRENT MEDICATIONS:  None.   ALLERGIES:  No known drug allergies.   REVIEW OF SYSTEMS:  The patient has a history of peptic ulcer disease  many years ago.   REVIEW OF SYSTEMS:  The patient smokes a pack and half cigarettes a day.  Denies any alcohol use.  Denies any other cardiopulmonary difficulties  or bleeding disorders.   PHYSICAL EXAMINATION:  GENERAL:  The patient is a well-developed, well-  nourished white male in no acute distress.  LUNGS:  Clear to auscultation with equal breath sounds bilaterally.  HEART:  Reveals regular rate and rhythm without S3, S4, or murmurs.  ABDOMEN:  Soft, nontender, nondistended.  No hepatosplenomegaly, masses,  hernias are identified.  RECTAL:  Examination reveals a palpable mass at 4 cm inside the anal  verge.  It is somewhat circumferential and fixed.   His preoperative CEA level is 5.  CT scan shows no pelvic or inguinal  lymphadenopathy.  There is a question of two very small lesions within  the right and left lobe of the liver.  They are too small to  characterize as to whether they are metastatic are not.   IMPRESSION:  Rectal carcinoma.   PLAN:  The patient is scheduled for an  APR on May 09, 2006.  Risks  and benefits of the procedure including bleeding, infection, possibility  of a blood transfusion, and a high probability of a permanent colostomy  were fully explained to the patient, gave informed consent.  I doubt  this is a amenable to low anterior resection but this will be assessed  at the time of surgery.      Dalia Heading, M.D.  Electronically Signed     MAJ/MEDQ  D:  05/05/2006  T:  05/05/2006  Job:  [id number]   cc:   R. Roetta Sessions, M.D.  [address]  [place]  [place] [address]   Kirstie Peri, MD  Fax: [phone]

## 2010-08-10 ENCOUNTER — Encounter (HOSPITAL_COMMUNITY): Payer: Managed Care, Other (non HMO)

## 2010-08-10 DIAGNOSIS — Z5111 Encounter for antineoplastic chemotherapy: Secondary | ICD-10-CM

## 2010-08-10 DIAGNOSIS — Z5112 Encounter for antineoplastic immunotherapy: Secondary | ICD-10-CM

## 2010-08-10 DIAGNOSIS — C189 Malignant neoplasm of colon, unspecified: Secondary | ICD-10-CM

## 2010-08-25 ENCOUNTER — Other Ambulatory Visit (HOSPITAL_COMMUNITY): Payer: Self-pay | Admitting: Oncology

## 2010-08-25 ENCOUNTER — Encounter (HOSPITAL_COMMUNITY): Payer: Managed Care, Other (non HMO) | Attending: Oncology

## 2010-08-25 DIAGNOSIS — C7800 Secondary malignant neoplasm of unspecified lung: Secondary | ICD-10-CM | POA: Insufficient documentation

## 2010-08-25 DIAGNOSIS — Z85048 Personal history of other malignant neoplasm of rectum, rectosigmoid junction, and anus: Secondary | ICD-10-CM | POA: Insufficient documentation

## 2010-08-25 DIAGNOSIS — Z79899 Other long term (current) drug therapy: Secondary | ICD-10-CM | POA: Insufficient documentation

## 2010-08-25 DIAGNOSIS — C189 Malignant neoplasm of colon, unspecified: Secondary | ICD-10-CM

## 2010-08-25 LAB — CBC
MCHC: 33.7 g/dL (ref 30.0–36.0)
MCV: 90.5 fL (ref 78.0–100.0)
Platelets: 180 10*3/uL (ref 150–400)
RDW: 13.7 % (ref 11.5–15.5)
WBC: 6.8 10*3/uL (ref 4.0–10.5)

## 2010-08-25 LAB — COMPREHENSIVE METABOLIC PANEL
AST: 23 U/L (ref 0–37)
Albumin: 4.2 g/dL (ref 3.5–5.2)
Chloride: 100 mEq/L (ref 96–112)
Creatinine, Ser: 1.42 mg/dL (ref 0.4–1.5)
GFR calc Af Amer: >60 mL/min (ref >60)
Total Bilirubin: 0.3 mg/dL (ref 0.3–1.2)
Total Protein: 6.9 g/dL (ref 6.0–8.3)

## 2010-08-25 LAB — DIFFERENTIAL
Basophils Absolute: 0.1 10*3/uL (ref 0.0–0.1)
Basophils Relative: 1 % (ref 0–1)
Eosinophils Relative: 5 % (ref 0–5)
Lymphocytes Relative: 25 % (ref 12–46)
Neutro Abs: 4.3 10*3/uL (ref 1.7–7.7)

## 2010-08-26 ENCOUNTER — Encounter (HOSPITAL_COMMUNITY): Payer: Managed Care, Other (non HMO)

## 2010-08-26 DIAGNOSIS — Z5111 Encounter for antineoplastic chemotherapy: Secondary | ICD-10-CM

## 2010-08-26 DIAGNOSIS — C189 Malignant neoplasm of colon, unspecified: Secondary | ICD-10-CM

## 2010-08-27 ENCOUNTER — Encounter (HOSPITAL_COMMUNITY): Payer: Managed Care, Other (non HMO)

## 2010-08-27 DIAGNOSIS — C801 Malignant (primary) neoplasm, unspecified: Secondary | ICD-10-CM

## 2010-08-27 DIAGNOSIS — Z452 Encounter for adjustment and management of vascular access device: Secondary | ICD-10-CM

## 2010-08-27 DIAGNOSIS — C189 Malignant neoplasm of colon, unspecified: Secondary | ICD-10-CM

## 2010-08-31 ENCOUNTER — Encounter (HOSPITAL_COMMUNITY): Payer: Managed Care, Other (non HMO)

## 2010-09-02 ENCOUNTER — Encounter (HOSPITAL_COMMUNITY): Payer: Managed Care, Other (non HMO) | Admitting: Oncology

## 2010-09-02 DIAGNOSIS — C20 Malignant neoplasm of rectum: Secondary | ICD-10-CM

## 2010-09-08 ENCOUNTER — Other Ambulatory Visit (HOSPITAL_COMMUNITY): Payer: Self-pay | Admitting: Oncology

## 2010-09-08 ENCOUNTER — Encounter (HOSPITAL_COMMUNITY): Payer: Managed Care, Other (non HMO)

## 2010-09-08 DIAGNOSIS — C20 Malignant neoplasm of rectum: Secondary | ICD-10-CM

## 2010-09-08 LAB — CBC
HCT: 41.7 % (ref 39.0–52.0)
Hemoglobin: 14 g/dL (ref 13.0–17.0)
MCH: 30.5 pg (ref 26.0–34.0)
MCHC: 33.6 g/dL (ref 30.0–36.0)
MCV: 90.8 fL (ref 78.0–100.0)
RBC: 4.59 MIL/uL (ref 4.22–5.81)

## 2010-09-08 LAB — DIFFERENTIAL
Basophils Relative: 1 % (ref 0–1)
Lymphocytes Relative: 21 % (ref 12–46)
Lymphs Abs: 1.4 10*3/uL (ref 0.7–4.0)
Monocytes Absolute: 0.4 10*3/uL (ref 0.1–1.0)
Monocytes Relative: 6 % (ref 3–12)
Neutro Abs: 4.8 10*3/uL (ref 1.7–7.7)
Neutrophils Relative %: 69 % (ref 43–77)

## 2010-09-08 LAB — COMPREHENSIVE METABOLIC PANEL
ALT: 24 U/L (ref 0–53)
AST: 21 U/L (ref 0–37)
Alkaline Phosphatase: 97 U/L (ref 39–117)
CO2: 20 mEq/L (ref 19–32)
Chloride: 104 mEq/L (ref 96–112)
GFR calc Af Amer: >60 mL/min (ref >60)
GFR calc non Af Amer: 53 mL/min — ABNORMAL LOW (ref >60)
Glucose, Bld: 93 mg/dL (ref 70–99)
Potassium: 4.3 mEq/L (ref 3.5–5.1)
Sodium: 137 mEq/L (ref 135–145)

## 2010-09-09 ENCOUNTER — Encounter (HOSPITAL_COMMUNITY): Payer: Managed Care, Other (non HMO)

## 2010-09-09 DIAGNOSIS — Z5111 Encounter for antineoplastic chemotherapy: Secondary | ICD-10-CM

## 2010-09-09 DIAGNOSIS — C189 Malignant neoplasm of colon, unspecified: Secondary | ICD-10-CM

## 2010-09-09 LAB — URINALYSIS, DIPSTICK ONLY
Bilirubin Urine: NEGATIVE
Leukocytes, UA: NEGATIVE
Nitrite: NEGATIVE
Protein, ur: NEGATIVE mg/dL

## 2010-09-10 ENCOUNTER — Encounter (HOSPITAL_COMMUNITY): Payer: Managed Care, Other (non HMO)

## 2010-09-10 DIAGNOSIS — C189 Malignant neoplasm of colon, unspecified: Secondary | ICD-10-CM

## 2010-09-10 DIAGNOSIS — Z452 Encounter for adjustment and management of vascular access device: Secondary | ICD-10-CM

## 2010-09-21 ENCOUNTER — Other Ambulatory Visit (HOSPITAL_COMMUNITY): Payer: Self-pay | Admitting: Oncology

## 2010-09-21 ENCOUNTER — Encounter (HOSPITAL_COMMUNITY): Payer: Managed Care, Other (non HMO) | Attending: Oncology

## 2010-09-21 DIAGNOSIS — C189 Malignant neoplasm of colon, unspecified: Secondary | ICD-10-CM | POA: Insufficient documentation

## 2010-09-21 LAB — CBC
HCT: 40.4 % (ref 39.0–52.0)
MCHC: 33.7 g/dL (ref 30.0–36.0)
MCV: 93.1 fL (ref 78.0–100.0)
Platelets: 177 10*3/uL (ref 150–400)
RDW: 15.4 % (ref 11.5–15.5)
WBC: 6.3 10*3/uL (ref 4.0–10.5)

## 2010-09-21 LAB — DIFFERENTIAL
Basophils Absolute: 0.1 10*3/uL (ref 0.0–0.1)
Eosinophils Absolute: 0.2 10*3/uL (ref 0.0–0.7)
Eosinophils Relative: 3 % (ref 0–5)
Lymphocytes Relative: 29 % (ref 12–46)
Lymphs Abs: 1.9 10*3/uL (ref 0.7–4.0)
Monocytes Absolute: 0.5 10*3/uL (ref 0.1–1.0)

## 2010-09-22 ENCOUNTER — Other Ambulatory Visit (HOSPITAL_COMMUNITY): Payer: Self-pay | Admitting: Oncology

## 2010-09-22 ENCOUNTER — Other Ambulatory Visit: Payer: Self-pay | Admitting: Oncology

## 2010-09-22 ENCOUNTER — Encounter: Payer: Self-pay | Admitting: Oncology

## 2010-09-22 ENCOUNTER — Inpatient Hospital Stay (HOSPITAL_COMMUNITY): Payer: Managed Care, Other (non HMO)

## 2010-09-22 DIAGNOSIS — C189 Malignant neoplasm of colon, unspecified: Secondary | ICD-10-CM

## 2010-09-22 DIAGNOSIS — C20 Malignant neoplasm of rectum: Secondary | ICD-10-CM | POA: Insufficient documentation

## 2010-09-24 ENCOUNTER — Encounter (HOSPITAL_COMMUNITY): Payer: Managed Care, Other (non HMO)

## 2010-09-24 ENCOUNTER — Other Ambulatory Visit (HOSPITAL_COMMUNITY): Payer: Self-pay | Admitting: Oncology

## 2010-09-24 DIAGNOSIS — C189 Malignant neoplasm of colon, unspecified: Secondary | ICD-10-CM

## 2010-09-24 DIAGNOSIS — Z5111 Encounter for antineoplastic chemotherapy: Secondary | ICD-10-CM

## 2010-09-24 LAB — URINALYSIS, DIPSTICK ONLY
Glucose, UA: NEGATIVE mg/dL
Ketones, ur: NEGATIVE mg/dL
Leukocytes, UA: NEGATIVE
Nitrite: NEGATIVE
Protein, ur: NEGATIVE mg/dL
Urobilinogen, UA: 0.2 mg/dL (ref 0.0–1.0)

## 2010-09-25 ENCOUNTER — Encounter (HOSPITAL_COMMUNITY): Payer: Managed Care, Other (non HMO)

## 2010-09-25 ENCOUNTER — Other Ambulatory Visit: Payer: Self-pay | Admitting: Cardiology

## 2010-09-25 DIAGNOSIS — C7800 Secondary malignant neoplasm of unspecified lung: Secondary | ICD-10-CM

## 2010-09-25 DIAGNOSIS — C189 Malignant neoplasm of colon, unspecified: Secondary | ICD-10-CM

## 2010-09-25 DIAGNOSIS — Z452 Encounter for adjustment and management of vascular access device: Secondary | ICD-10-CM

## 2010-09-29 ENCOUNTER — Other Ambulatory Visit (HOSPITAL_COMMUNITY): Payer: Self-pay | Admitting: Oncology

## 2010-09-30 ENCOUNTER — Encounter (HOSPITAL_BASED_OUTPATIENT_CLINIC_OR_DEPARTMENT_OTHER): Payer: Managed Care, Other (non HMO) | Admitting: Oncology

## 2010-09-30 ENCOUNTER — Other Ambulatory Visit (HOSPITAL_COMMUNITY): Payer: Self-pay | Admitting: Oncology

## 2010-09-30 VITALS — BP 144/87 | HR 65 | Temp 95.6°F | Wt 208.0 lb

## 2010-09-30 DIAGNOSIS — R110 Nausea: Secondary | ICD-10-CM

## 2010-09-30 DIAGNOSIS — C7800 Secondary malignant neoplasm of unspecified lung: Secondary | ICD-10-CM

## 2010-09-30 DIAGNOSIS — C189 Malignant neoplasm of colon, unspecified: Secondary | ICD-10-CM

## 2010-09-30 DIAGNOSIS — C19 Malignant neoplasm of rectosigmoid junction: Secondary | ICD-10-CM

## 2010-09-30 MED ORDER — ONDANSETRON HCL 8 MG PO TABS: 8.0000 mg | ORAL_TABLET | Freq: Three times a day (TID) | ORAL | Status: AC | PRN

## 2010-09-30 NOTE — Progress Notes (Signed)
[facility], MD [address]  [place] [address]  1. Colon cancer  ondansetron (ZOFRAN) 8 MG tablet, CT Chest W Contrast, CT Abdomen Pelvis W Contrast, CBC, Differential, CEA, CBC, Differential, Comprehensive metabolic panel, Comprehensive metabolic panel    CURRENT THERAPY: S/P 4 cycles of lLeucovorin, 5-FU bolus, 5-FU continuous infusion, Avastin, and CPT-11.  He began this therapy on 08/10/10 and was last treated on this regimen on 09/24/10.  S/P 6 cycles of oxaliplatin, avastin, xeloda (06/14/06- 09/28/06), and 7 cycles of Leucovorin, 5-FU bolus, 5-FU infusion and Avastin (06/30/09- 01/06/10)  INTERVAL HISTORY: Martin Maldonado 55 y.o. 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  The patient request a one week deferment so he can travel to [place], [place] to see his niece compete in a dancing competition.  We can certainly do that without a problem.  He denies any complaints this morning to me.  He explains that he is close to attaining disability and possibly "back pay".  He says he had some nausea yesterday which caused him to take his anti-emetics including lorazepam and compazine.  That helped slightly but did not resolve it completely.  Today he says his nausea has resolved.  I will call him in a Rx for Zafran to have on hand.  Past Medical History  Diagnosis Date  . Hypertension   . Cancer   . Colon cancer   . Colon cancer 09/22/2010    has HYPERLIPIDEMIA; TOBACCO ABUSE; HYPERTENSION; COPD; SPLENOMEGALY; ALCOHOL ABUSE, HX OF; DUODENAL ULCER, HX OF; SMALL BOWEL OBSTRUCTION, HX OF; ABSCESS, PERIRECTAL, HX OF; and Colon cancer on his problem list.      has no known allergies.  Mr. Simmering does not currently have medications on file.  No past surgical history on file.  He denies any headaches, dizziness, double vision, fevers, chills, night sweats, nausea, vomiting, diarrhea, constipation, chest pain, heart palpitations, shortness of breath, blood in stool,  black tarry stool, urinary pain, urinary burning, urinary frequency, hematuria.   PHYSICAL EXAMINATION  ECOG PERFORMANCE STATUS: 1 - Symptomatic but completely ambulatory  Filed Vitals:   09/30/10 1027  BP: 144/87  Pulse: 65  Temp: 95.6 F (35.3 C)    GENERAL:alert, no distress, well nourished, well developed, comfortable and cooperative SKIN: skin color, texture, turgor are normal, no rashes or significant lesions HEAD: Normocephalic, No masses, lesions, tenderness or abnormalities EYES: normal EARS: External ears normal OROPHARYNX:Not examined  NECK: supple LYMPH:  not examined BREAST:not examined LUNGS: clear to auscultation and percussion HEART: regular rate & rhythm, no murmurs, no gallops, S1 normal and S2 normal ABDOMEN:abdomen soft, non-tender, normal bowel sounds, no masses or organomegaly, no rebound or guarding, no bruits and colostomy bag in place and producing stool. BACK: Back symmetric, no curvature., No CVA tenderness EXTREMITIES:less then 2 second capillary refill, no joint deformities, effusion, or inflammation, no edema, no skin discoloration  NEURO: alert & oriented x 3 with fluent speech, no focal motor/sensory deficits, gait normal    LABORATORY DATA: Lab Results  Component Value Date   WBC 6.3 09/21/2010   HGB 13.6 09/21/2010   HCT 40.4 09/21/2010   MCV 93.1 09/21/2010   PLT 177 09/21/2010     Chemistry      Component Value Date/Time   NA 137 09/08/2010 0955   K 4.3 09/08/2010 0955   CL 104 09/08/2010 0955   CO2 20 09/08/2010 0955   BUN 23 09/08/2010 0955   CREATININE 1.41* 09/08/2010  6213      Component Value Date/Time   CALCIUM 10.5 09/08/2010 0955   ALKPHOS 97 09/08/2010 0955   AST 21 09/08/2010 0955   ALT 24 09/08/2010 0955   BILITOT 0.4 09/08/2010 0955     Lab Results  Component Value Date   CEA 5.1* 09/08/2010    ASSESSMENT:  1. 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 2. Colorectal cancer diagnosed in 2008.  S/P surgery on 05/09/2006  with 1/12 positive nodes, LVI identified, and a 4 cm primary.  S/P chemotherapy and XRT with concomitant capecitabine. 3. Nausea, on compazine and lorazepam.  Will add Zofran to regimen.   PLAN:  1. Will defer treatment for one week so the patient can attend his niece. 2. Will obtain lab work per chemotherapy orders.   Will follow cbc diff, cmet, cea. 3. CT of CAP with contrast will be scheduled 10-14 days following cycle 6 of chemotherapy. 4. The patient may likely be a good candidate for a clinical trial in the future.   5. Will e-prescribe Zofran 8 mg #15 with refills.  He will have this on hand for further nausea control 5. The patient will follow-up in 3-4 weeks for follow-up.    All questions were answered. The patient knows to call the clinic with any problems, questions or concerns. We can certainly see the patient much sooner if necessary.  The patient and plan discussed with Glenford Peers, MD and he is in agreement with the aforementioned.  I spent 25 minutes counseling the patient face to face. The total time spent in the appointment was 40 minutes.  Awanda Wilcock

## 2010-09-30 NOTE — Patient Instructions (Signed)
[facility]  Discharge Instructions  RECOMMENDATIONS MADE BY THE CONSULTANT AND ANY TEST RESULTS WILL BE SENT TO YOUR REFERRING DOCTOR.   EXAM FINDINGS BY MD TODAY AND SIGNS AND SYMPTOMS TO REPORT TO CLINIC OR PRIMARY MD: We will postpone treatment one week       MEDICATIONS PRESCRIBED: zofran Follow label directions  INSTRUCTIONS GIVEN AND DISCUSSED: Other none  SPECIAL INSTRUCTIONS/FOLLOW-UP: Return to Clinic on 5 weeks   I acknowledge that I have been informed and understand all the instructions given to me and received a copy. I do not have any more questions at this time, but understand that I may call the Specialty Clinic at [facility] at [phone] during business hours should I have any further questions or need assistance in obtaining follow-up care.    __________________________________________  _____________  __________ Signature of Patient or Authorized Representative            Date                   Time    __________________________________________ Nurse's Signature

## 2010-10-02 ENCOUNTER — Other Ambulatory Visit (HOSPITAL_COMMUNITY): Payer: Self-pay | Admitting: Oncology

## 2010-10-06 ENCOUNTER — Encounter (HOSPITAL_COMMUNITY): Payer: Managed Care, Other (non HMO)

## 2010-10-08 ENCOUNTER — Encounter (HOSPITAL_COMMUNITY): Payer: Managed Care, Other (non HMO)

## 2010-10-13 ENCOUNTER — Encounter (HOSPITAL_BASED_OUTPATIENT_CLINIC_OR_DEPARTMENT_OTHER): Payer: Managed Care, Other (non HMO)

## 2010-10-13 DIAGNOSIS — C189 Malignant neoplasm of colon, unspecified: Secondary | ICD-10-CM

## 2010-10-13 LAB — CBC
HCT: 42.5 % (ref 39.0–52.0)
Hemoglobin: 14.2 g/dL (ref 13.0–17.0)
RBC: 4.46 MIL/uL (ref 4.22–5.81)
WBC: 4.9 10*3/uL (ref 4.0–10.5)

## 2010-10-13 LAB — COMPREHENSIVE METABOLIC PANEL
ALT: 30 U/L (ref 0–53)
Alkaline Phosphatase: 115 U/L (ref 39–117)
CO2: 25 mEq/L (ref 19–32)
GFR calc Af Amer: 48 mL/min — ABNORMAL LOW (ref >60)
GFR calc non Af Amer: 39 mL/min — ABNORMAL LOW (ref >60)
Glucose, Bld: 110 mg/dL — ABNORMAL HIGH (ref 70–99)
Potassium: 4.4 mEq/L (ref 3.5–5.1)
Sodium: 139 mEq/L (ref 135–145)

## 2010-10-13 LAB — DIFFERENTIAL
Basophils Absolute: 0.1 10*3/uL (ref 0.0–0.1)
Eosinophils Absolute: 0.3 10*3/uL (ref 0.0–0.7)
Eosinophils Relative: 6 % — ABNORMAL HIGH (ref 0–5)
Lymphs Abs: 1.7 10*3/uL (ref 0.7–4.0)
Monocytes Absolute: 0.7 10*3/uL (ref 0.1–1.0)

## 2010-10-13 NOTE — Progress Notes (Signed)
Labs drawn today for cbc/diff,cmp,cea 

## 2010-10-14 ENCOUNTER — Other Ambulatory Visit (HOSPITAL_COMMUNITY): Payer: Self-pay | Admitting: Oncology

## 2010-10-14 ENCOUNTER — Encounter (HOSPITAL_BASED_OUTPATIENT_CLINIC_OR_DEPARTMENT_OTHER): Payer: Managed Care, Other (non HMO)

## 2010-10-14 VITALS — BP 152/84 | HR 64 | Temp 97.7°F | Ht 73.0 in | Wt 210.6 lb

## 2010-10-14 DIAGNOSIS — C189 Malignant neoplasm of colon, unspecified: Secondary | ICD-10-CM

## 2010-10-14 DIAGNOSIS — Z5111 Encounter for antineoplastic chemotherapy: Secondary | ICD-10-CM

## 2010-10-14 LAB — URINALYSIS, DIPSTICK ONLY
Leukocytes, UA: NEGATIVE
Nitrite: NEGATIVE
Specific Gravity, Urine: 1.02 (ref 1.005–1.030)
pH: 5.5 (ref 5.0–8.0)

## 2010-10-14 MED ORDER — IRINOTECAN HCL CHEMO INJECTION 100 MG/5ML: 180.0000 mg/m2 | Freq: Once | INTRAVENOUS | Status: DC

## 2010-10-14 MED ORDER — FLUOROURACIL CHEMO INJECTION 2.5 GM/50ML
890.0000 mg | Freq: Once | INTRAVENOUS | Status: AC
  Administered 2010-10-14: 900 mg via INTRAVENOUS
  Filled 2010-10-14: qty 18

## 2010-10-14 MED ORDER — SODIUM CHLORIDE 0.9 % IJ SOLN
INTRAMUSCULAR | Status: AC
  Administered 2010-10-14: 10 mL
  Filled 2010-10-14: qty 10

## 2010-10-14 MED ORDER — LEUCOVORIN CALCIUM INJECTION 100 MG
46.0000 mg | Freq: Once | INTRAMUSCULAR | Status: AC
  Administered 2010-10-14: 46 mg via INTRAVENOUS
  Filled 2010-10-14: qty 2.3

## 2010-10-14 MED ORDER — ONDANSETRON 8 MG/50ML IVPB (CHCC): 24.0000 mg | Freq: Once | INTRAVENOUS | Status: DC

## 2010-10-14 MED ORDER — SODIUM CHLORIDE 0.9 % IV SOLN
Freq: Once | INTRAVENOUS | Status: AC
  Administered 2010-10-14: 09:00:00 via INTRAVENOUS

## 2010-10-14 MED ORDER — IRINOTECAN HCL CHEMO INJECTION 100 MG/5ML
180.0000 mg/m2 | Freq: Once | INTRAVENOUS | Status: AC
  Administered 2010-10-14: 400 mg via INTRAVENOUS
  Filled 2010-10-14: qty 20

## 2010-10-14 MED ORDER — LORAZEPAM 2 MG/ML IJ SOLN
INTRAMUSCULAR | Status: AC
  Filled 2010-10-14: qty 1

## 2010-10-14 MED ORDER — LORAZEPAM 2 MG/ML IJ SOLN
1.0000 mg | Freq: Once | INTRAMUSCULAR | Status: AC
  Administered 2010-10-14: 1 mg via INTRAVENOUS

## 2010-10-14 MED ORDER — SODIUM CHLORIDE 0.9 % IV SOLN
485.0000 mg | Freq: Once | INTRAVENOUS | Status: AC
  Administered 2010-10-14: 475 mg via INTRAVENOUS
  Filled 2010-10-14: qty 19

## 2010-10-14 MED ORDER — FLUOROURACIL CHEMO INJECTION 2.5 GM/50ML: 890.0000 mg | Freq: Once | INTRAVENOUS | Status: DC

## 2010-10-14 MED ORDER — DEXAMETHASONE SODIUM PHOSPHATE 4 MG/ML IJ SOLN: 16.0000 mg | Freq: Once | INTRAMUSCULAR | Status: DC

## 2010-10-14 MED ORDER — SODIUM CHLORIDE 0.9 % IV SOLN
Freq: Once | INTRAVENOUS | Status: AC
  Administered 2010-10-14: 24 mg via INTRAVENOUS
  Filled 2010-10-14: qty 12

## 2010-10-14 MED ORDER — SODIUM CHLORIDE 0.9 % IV SOLN
5350.0000 mg | INTRAVENOUS | Status: DC
  Administered 2010-10-14: 5350 mg via INTRAVENOUS
  Filled 2010-10-14: qty 107

## 2010-10-14 MED ORDER — SODIUM CHLORIDE 0.9 % IJ SOLN
10.0000 mL | INTRAMUSCULAR | Status: DC | PRN
  Administered 2010-10-14: 10 mL

## 2010-10-14 MED ORDER — HEPARIN SOD (PORK) LOCK FLUSH 100 UNIT/ML IV SOLN: 500.0000 [IU] | Freq: Once | INTRAVENOUS | Status: DC | PRN

## 2010-10-14 MED ORDER — FLUOROURACIL CHEMO INJECTION 2.5 GM/50ML: 900.0000 mg | Freq: Once | INTRAVENOUS | Status: DC

## 2010-10-14 MED ORDER — LEUCOVORIN CALCIUM INJECTION 350 MG: 45.0000 mg | Freq: Once | INTRAVENOUS | Status: DC

## 2010-10-15 ENCOUNTER — Encounter (HOSPITAL_BASED_OUTPATIENT_CLINIC_OR_DEPARTMENT_OTHER): Payer: Managed Care, Other (non HMO)

## 2010-10-15 ENCOUNTER — Other Ambulatory Visit (HOSPITAL_COMMUNITY): Payer: Self-pay | Admitting: Oncology

## 2010-10-15 VITALS — BP 123/83 | HR 76 | Temp 98.1°F

## 2010-10-15 DIAGNOSIS — Z452 Encounter for adjustment and management of vascular access device: Secondary | ICD-10-CM

## 2010-10-15 DIAGNOSIS — C189 Malignant neoplasm of colon, unspecified: Secondary | ICD-10-CM

## 2010-10-15 MED ORDER — SODIUM CHLORIDE 0.9 % IJ SOLN
10.0000 mL | INTRAMUSCULAR | Status: DC | PRN
  Administered 2010-10-15: 10 mL

## 2010-10-15 MED ORDER — HEPARIN SOD (PORK) LOCK FLUSH 100 UNIT/ML IV SOLN
INTRAVENOUS | Status: AC
  Administered 2010-10-15: 500 [IU]
  Filled 2010-10-15: qty 5

## 2010-10-15 MED ORDER — SODIUM CHLORIDE 0.9 % IJ SOLN
INTRAMUSCULAR | Status: AC
  Administered 2010-10-15: 10 mL
  Filled 2010-10-15: qty 10

## 2010-10-15 MED ORDER — HEPARIN SOD (PORK) LOCK FLUSH 100 UNIT/ML IV SOLN
500.0000 [IU] | Freq: Once | INTRAVENOUS | Status: AC | PRN
  Administered 2010-10-15: 500 [IU]

## 2010-10-15 NOTE — Progress Notes (Signed)
Patient tolerated chemo with no difficulties. VSS. Port flushed, blood return noted, deaccessed per protocol.

## 2010-10-20 ENCOUNTER — Encounter (HOSPITAL_COMMUNITY): Payer: Managed Care, Other (non HMO)

## 2010-10-20 ENCOUNTER — Other Ambulatory Visit (HOSPITAL_COMMUNITY): Payer: Managed Care, Other (non HMO)

## 2010-10-27 ENCOUNTER — Other Ambulatory Visit (HOSPITAL_COMMUNITY): Payer: Managed Care, Other (non HMO)

## 2010-10-27 ENCOUNTER — Encounter (HOSPITAL_COMMUNITY): Payer: Managed Care, Other (non HMO) | Attending: Oncology

## 2010-10-27 DIAGNOSIS — C189 Malignant neoplasm of colon, unspecified: Secondary | ICD-10-CM

## 2010-10-27 LAB — COMPREHENSIVE METABOLIC PANEL
Alkaline Phosphatase: 99 U/L (ref 39–117)
BUN: 19 mg/dL (ref 6–23)
CO2: 20 mEq/L (ref 19–32)
Chloride: 99 mEq/L (ref 96–112)
Creatinine, Ser: 1.34 mg/dL (ref 0.50–1.35)
GFR calc non Af Amer: 56 mL/min — ABNORMAL LOW (ref >60)
Glucose, Bld: 95 mg/dL (ref 70–99)
Total Bilirubin: 0.4 mg/dL (ref 0.3–1.2)

## 2010-10-27 LAB — DIFFERENTIAL
Basophils Absolute: 0.1 10*3/uL (ref 0.0–0.1)
Basophils Relative: 1 % (ref 0–1)
Eosinophils Absolute: 0.2 10*3/uL (ref 0.0–0.7)
Neutro Abs: 4.2 10*3/uL (ref 1.7–7.7)
Neutrophils Relative %: 65 % (ref 43–77)

## 2010-10-27 LAB — CBC
Hemoglobin: 14.1 g/dL (ref 13.0–17.0)
MCH: 32 pg (ref 26.0–34.0)
Platelets: 230 10*3/uL (ref 150–400)
RBC: 4.4 MIL/uL (ref 4.22–5.81)
WBC: 6.5 10*3/uL (ref 4.0–10.5)

## 2010-10-27 NOTE — Progress Notes (Signed)
Labs drawn today for cbc/diff,cmp,cea 

## 2010-10-28 ENCOUNTER — Encounter (HOSPITAL_BASED_OUTPATIENT_CLINIC_OR_DEPARTMENT_OTHER): Payer: Managed Care, Other (non HMO)

## 2010-10-28 ENCOUNTER — Other Ambulatory Visit: Payer: Self-pay | Admitting: Cardiology

## 2010-10-28 VITALS — BP 159/85 | HR 91 | Temp 97.7°F | Wt 208.0 lb

## 2010-10-28 DIAGNOSIS — Z5111 Encounter for antineoplastic chemotherapy: Secondary | ICD-10-CM

## 2010-10-28 DIAGNOSIS — Z5112 Encounter for antineoplastic immunotherapy: Secondary | ICD-10-CM

## 2010-10-28 DIAGNOSIS — C189 Malignant neoplasm of colon, unspecified: Secondary | ICD-10-CM

## 2010-10-28 MED ORDER — HEPARIN SOD (PORK) LOCK FLUSH 100 UNIT/ML IV SOLN: 500.0000 [IU] | Freq: Once | INTRAVENOUS | Status: DC | PRN

## 2010-10-28 MED ORDER — FLUOROURACIL CHEMO INJECTION 2.5 GM/50ML
400.0000 mg/m2 | Freq: Once | INTRAVENOUS | Status: AC
  Administered 2010-10-28: 900 mg via INTRAVENOUS
  Filled 2010-10-28: qty 18

## 2010-10-28 MED ORDER — SODIUM CHLORIDE 0.9 % IV SOLN
Freq: Once | INTRAVENOUS | Status: AC
  Administered 2010-10-28: 09:00:00 via INTRAVENOUS

## 2010-10-28 MED ORDER — LORAZEPAM 2 MG/ML IJ SOLN
INTRAMUSCULAR | Status: AC
  Filled 2010-10-28: qty 1

## 2010-10-28 MED ORDER — SODIUM CHLORIDE 0.9 % IV SOLN
Freq: Once | INTRAVENOUS | Status: AC
  Administered 2010-10-28: 24 mg via INTRAVENOUS
  Filled 2010-10-28: qty 12

## 2010-10-28 MED ORDER — LEUCOVORIN CALCIUM INJECTION 100 MG
46.0000 mg | Freq: Once | INTRAMUSCULAR | Status: AC
  Administered 2010-10-28: 46 mg via INTRAVENOUS
  Filled 2010-10-28: qty 2.3

## 2010-10-28 MED ORDER — ONDANSETRON 8 MG/50ML IVPB (CHCC): 24.0000 mg | Freq: Once | INTRAVENOUS | Status: DC

## 2010-10-28 MED ORDER — SODIUM CHLORIDE 0.9 % IV SOLN
5350.0000 mg | INTRAVENOUS | Status: DC
  Administered 2010-10-28: 5350 mg via INTRAVENOUS
  Filled 2010-10-28: qty 107

## 2010-10-28 MED ORDER — DEXAMETHASONE SODIUM PHOSPHATE 4 MG/ML IJ SOLN: 16.0000 mg | Freq: Once | INTRAMUSCULAR | Status: DC

## 2010-10-28 MED ORDER — SODIUM CHLORIDE 0.9 % IV SOLN
485.0000 mg | Freq: Once | INTRAVENOUS | Status: AC
  Administered 2010-10-28: 475 mg via INTRAVENOUS
  Filled 2010-10-28: qty 19

## 2010-10-28 MED ORDER — SODIUM CHLORIDE 0.9 % IJ SOLN: 10.0000 mL | INTRAMUSCULAR | Status: DC | PRN

## 2010-10-28 MED ORDER — LORAZEPAM 2 MG/ML IJ SOLN: 1.0000 mg | Freq: Once | INTRAMUSCULAR | Status: DC

## 2010-10-28 MED ORDER — ALTEPLASE 2 MG IJ SOLR: 2.0000 mg | Freq: Once | INTRAMUSCULAR | Status: DC | PRN

## 2010-10-28 MED ORDER — IRINOTECAN HCL CHEMO INJECTION 100 MG/5ML
180.0000 mg/m2 | Freq: Once | INTRAVENOUS | Status: AC
  Administered 2010-10-28: 400 mg via INTRAVENOUS
  Filled 2010-10-28: qty 20

## 2010-10-28 MED ORDER — LEUCOVORIN CALCIUM INJECTION 350 MG: 45.0000 mg | Freq: Once | INTRAVENOUS | Status: DC

## 2010-10-28 MED ORDER — HEPARIN SOD (PORK) LOCK FLUSH 100 UNIT/ML IV SOLN: 250.0000 [IU] | Freq: Once | INTRAVENOUS | Status: DC | PRN

## 2010-10-28 MED ORDER — SODIUM CHLORIDE 0.9 % IJ SOLN: 3.0000 mL | INTRAMUSCULAR | Status: DC | PRN

## 2010-10-29 ENCOUNTER — Encounter (HOSPITAL_BASED_OUTPATIENT_CLINIC_OR_DEPARTMENT_OTHER): Payer: Managed Care, Other (non HMO)

## 2010-10-29 DIAGNOSIS — Z452 Encounter for adjustment and management of vascular access device: Secondary | ICD-10-CM

## 2010-10-29 DIAGNOSIS — C189 Malignant neoplasm of colon, unspecified: Secondary | ICD-10-CM

## 2010-10-29 MED ORDER — HEPARIN SOD (PORK) LOCK FLUSH 100 UNIT/ML IV SOLN
INTRAVENOUS | Status: AC
  Filled 2010-10-29: qty 5

## 2010-10-29 MED ORDER — SODIUM CHLORIDE 0.9 % IJ SOLN
10.0000 mL | Freq: Once | INTRAMUSCULAR | Status: AC
  Administered 2010-10-29: 10 mL via INTRAVENOUS

## 2010-10-29 MED ORDER — SODIUM CHLORIDE 0.9 % IJ SOLN
INTRAMUSCULAR | Status: AC
  Filled 2010-10-29: qty 10

## 2010-10-29 MED ORDER — HEPARIN SOD (PORK) LOCK FLUSH 100 UNIT/ML IV SOLN
500.0000 [IU] | Freq: Once | INTRAVENOUS | Status: AC
  Administered 2010-10-29: 500 [IU] via INTRAVENOUS

## 2010-10-29 NOTE — Progress Notes (Signed)
Martin Maldonado presented for Portacath access and flush. Proper placement of portacath confirmed by CXR. Portacath located rt chest wall accessed with  H 20 needle. Good blood return present. Pump d/c ed and Portacath flushed with 20ml NS and 500U/66ml Heparin and needle removed intact. Procedure without incident. Patient tolerated procedure well.

## 2010-11-10 ENCOUNTER — Other Ambulatory Visit (HOSPITAL_COMMUNITY): Payer: Self-pay | Admitting: *Deleted

## 2010-11-10 DIAGNOSIS — C189 Malignant neoplasm of colon, unspecified: Secondary | ICD-10-CM

## 2010-11-11 ENCOUNTER — Ambulatory Visit (HOSPITAL_COMMUNITY)
Admission: RE | Admit: 2010-11-11 | Discharge: 2010-11-11 | Disposition: A | Discharge status: Home / Self Care | Payer: Managed Care, Other (non HMO) | Source: Ambulatory Visit | Attending: Oncology | Admitting: Oncology

## 2010-11-11 DIAGNOSIS — I10 Essential (primary) hypertension: Secondary | ICD-10-CM | POA: Insufficient documentation

## 2010-11-11 DIAGNOSIS — Z9221 Personal history of antineoplastic chemotherapy: Secondary | ICD-10-CM | POA: Insufficient documentation

## 2010-11-11 DIAGNOSIS — J984 Other disorders of lung: Secondary | ICD-10-CM | POA: Insufficient documentation

## 2010-11-11 DIAGNOSIS — J449 Chronic obstructive pulmonary disease, unspecified: Secondary | ICD-10-CM | POA: Insufficient documentation

## 2010-11-11 DIAGNOSIS — J4489 Other specified chronic obstructive pulmonary disease: Secondary | ICD-10-CM | POA: Insufficient documentation

## 2010-11-11 DIAGNOSIS — C189 Malignant neoplasm of colon, unspecified: Secondary | ICD-10-CM

## 2010-11-11 DIAGNOSIS — Z933 Colostomy status: Secondary | ICD-10-CM | POA: Insufficient documentation

## 2010-11-11 MED ORDER — IOHEXOL 300 MG/ML  SOLN
100.0000 mL | Freq: Once | INTRAMUSCULAR | Status: AC | PRN
  Administered 2010-11-11: 100 mL via INTRAVENOUS

## 2010-11-13 ENCOUNTER — Encounter (HOSPITAL_COMMUNITY): Payer: Self-pay | Admitting: Oncology

## 2010-11-13 ENCOUNTER — Encounter (HOSPITAL_BASED_OUTPATIENT_CLINIC_OR_DEPARTMENT_OTHER): Payer: Managed Care, Other (non HMO) | Admitting: Oncology

## 2010-11-13 VITALS — BP 153/85 | HR 68 | Temp 97.5°F | Wt 213.0 lb

## 2010-11-13 DIAGNOSIS — C7800 Secondary malignant neoplasm of unspecified lung: Secondary | ICD-10-CM

## 2010-11-13 DIAGNOSIS — C19 Malignant neoplasm of rectosigmoid junction: Secondary | ICD-10-CM

## 2010-11-13 DIAGNOSIS — C189 Malignant neoplasm of colon, unspecified: Secondary | ICD-10-CM

## 2010-11-13 NOTE — Progress Notes (Signed)
CC:   Kirstie Peri, MD R. Roetta Sessions, MD FACP Lajoyce Lauber. Dietrich Pates, MD, [facility] Dalia Heading, M.D.  DIAGNOSIS:  Stage IV metastatic colorectal cancer now to lungs with worsening of disease recently.  I have put him on FOLFIRI plus Avastin. That therapy started on 08/10/2010.  He has had 6 doses thus far.  He is here for followup.  His worst complaint is that he is nauseated after chemotherapy for 2-3 days.  He never vomits.  He is a little weak and tired, would like next week off.  But, I did go over his CT scans with him and his sister who accompany him today and he has had excellent response with at least 30% to 50% reduction in masses.  So what we are going to do is tentatively set him up for 6 more doses, but take 2 doses at a time.  He thinks he is getting a little bit too tired with this therapy and his counts, namely his white count, is at times a little low.  I mentioned to him that it is typically because of the chemotherapy and the accumulation sometimes of chemotherapy.  His CEA is not the best marker so that is why we have to do CT scans on him.  It really does not change much at all.  He has been given disability which I think is appropriate.  His vital signs today are very stable.  Weight stable.  I fact, his appetite is too good.  He has actually gained some weight.  Other than that, I did not examine him because he is not having any other problems. So, his scans of better.  He is okay.  We are going to go with 2 more cycles, give him a week off.  We will start again on September 5th.  I will see him on the 26th or Tom Kefalas, my PA, will and if he needs a break we will then repeat a CT of his chest 3 months later.  He does mention to me that Elijah Birk spoke of a clinical trial at [facility] but he would rather go to [facility] and therefore, if need be, we will set him up with Dr. Valetta Close for consultation in the  future.    ______________________________ Ladona Horns. Mariel Sleet, MD ESN/MEDQ  D:  11/13/2010  T:  11/13/2010  Job:  [id number]

## 2010-11-13 NOTE — Patient Instructions (Signed)
[facility]  Discharge Instructions  RECOMMENDATIONS MADE BY THE CONSULTANT AND ANY TEST RESULTS WILL BE SENT TO YOUR REFERRING DOCTOR.   EXAM FINDINGS BY MD TODAY AND SIGNS AND SYMPTOMS TO REPORT TO CLINIC OR PRIMARY MD: To take a 1 week break with chemo.  You are doing well.  Bring all medications on next visit so we can see what you actually have for nausea  MEDICATIONS PRESCRIBED: none   INSTRUCTIONS GIVEN AND DISCUSSED:  Report any fevers, uncontrolled nausea or vomiting.   SPECIAL INSTRUCTIONS/FOLLOW-UP: Return to Clinic on as scheduled.   I acknowledge that I have been informed and understand all the instructions given to me and received a copy. I do not have any more questions at this time, but understand that I may call the Specialty Clinic at [facility] at [phone] during business hours should I have any further questions or need assistance in obtaining follow-up care.    __________________________________________  _____________  __________ Signature of Patient or Authorized Representative            Date                   Time    __________________________________________ Nurse's Signature

## 2010-11-13 NOTE — Progress Notes (Signed)
This office note has been dictated.

## 2010-11-24 ENCOUNTER — Encounter (HOSPITAL_COMMUNITY): Payer: Managed Care, Other (non HMO) | Attending: Oncology

## 2010-11-24 DIAGNOSIS — C189 Malignant neoplasm of colon, unspecified: Secondary | ICD-10-CM

## 2010-11-24 LAB — DIFFERENTIAL
Eosinophils Absolute: 0.2 10*3/uL (ref 0.0–0.7)
Eosinophils Relative: 3 % (ref 0–5)
Lymphs Abs: 1.6 10*3/uL (ref 0.7–4.0)
Monocytes Absolute: 0.7 10*3/uL (ref 0.1–1.0)
Monocytes Relative: 8 % (ref 3–12)

## 2010-11-24 LAB — CBC
HCT: 44.5 % (ref 39.0–52.0)
Hemoglobin: 15 g/dL (ref 13.0–17.0)
MCH: 32.8 pg (ref 26.0–34.0)
MCV: 97.4 fL (ref 78.0–100.0)
RBC: 4.57 MIL/uL (ref 4.22–5.81)

## 2010-11-24 LAB — BASIC METABOLIC PANEL
CO2: 25 mEq/L (ref 19–32)
Calcium: 10 mg/dL (ref 8.4–10.5)
Creatinine, Ser: 1.23 mg/dL (ref 0.50–1.35)
Glucose, Bld: 88 mg/dL (ref 70–99)

## 2010-11-24 NOTE — Progress Notes (Signed)
Labs drawn today for cbc/diff, cea,bmp

## 2010-11-25 ENCOUNTER — Encounter (HOSPITAL_BASED_OUTPATIENT_CLINIC_OR_DEPARTMENT_OTHER): Payer: Managed Care, Other (non HMO)

## 2010-11-25 VITALS — BP 153/95 | HR 93 | Temp 97.0°F | Ht 73.0 in | Wt 213.0 lb

## 2010-11-25 DIAGNOSIS — Z5111 Encounter for antineoplastic chemotherapy: Secondary | ICD-10-CM

## 2010-11-25 DIAGNOSIS — C189 Malignant neoplasm of colon, unspecified: Secondary | ICD-10-CM

## 2010-11-25 MED ORDER — IRINOTECAN HCL CHEMO INJECTION 100 MG/5ML
180.0000 mg/m2 | Freq: Once | INTRAVENOUS | Status: AC
  Administered 2010-11-25: 400 mg via INTRAVENOUS
  Filled 2010-11-25: qty 20

## 2010-11-25 MED ORDER — DEXAMETHASONE SODIUM PHOSPHATE 4 MG/ML IJ SOLN: 16.0000 mg | Freq: Once | INTRAMUSCULAR | Status: DC

## 2010-11-25 MED ORDER — SODIUM CHLORIDE 0.9 % IJ SOLN
INTRAMUSCULAR | Status: AC
  Filled 2010-11-25: qty 10

## 2010-11-25 MED ORDER — BEVACIZUMAB CHEMO INJECTION 400 MG/16ML
485.0000 mg | Freq: Once | INTRAVENOUS | Status: AC
  Administered 2010-11-25: 475 mg via INTRAVENOUS
  Filled 2010-11-25: qty 19

## 2010-11-25 MED ORDER — DOXORUBICIN HCL LIPOSOMAL CHEMO INJECTION 2 MG/ML: 70.0000 mg | Freq: Once | INTRAVENOUS | Status: DC

## 2010-11-25 MED ORDER — ONDANSETRON 8 MG/50ML IVPB (CHCC): 24.0000 mg | Freq: Once | INTRAVENOUS | Status: DC

## 2010-11-25 MED ORDER — SODIUM CHLORIDE 0.9 % IV SOLN
5350.0000 mg | INTRAVENOUS | Status: DC
  Administered 2010-11-25: 5350 mg via INTRAVENOUS
  Filled 2010-11-25 (×2): qty 107

## 2010-11-25 MED ORDER — SODIUM CHLORIDE 0.9 % IV SOLN
Freq: Once | INTRAVENOUS | Status: AC
  Administered 2010-11-25: 10:00:00 via INTRAVENOUS

## 2010-11-25 MED ORDER — LEUCOVORIN CALCIUM INJECTION 350 MG
45.0000 mg | Freq: Once | INTRAVENOUS | Status: DC
  Filled 2010-11-25: qty 2.3

## 2010-11-25 MED ORDER — LORAZEPAM 2 MG/ML IJ SOLN
1.0000 mg | Freq: Once | INTRAMUSCULAR | Status: AC
  Administered 2010-11-25: 10:00:00 via INTRAVENOUS

## 2010-11-25 MED ORDER — LORAZEPAM 2 MG/ML IJ SOLN
INTRAMUSCULAR | Status: AC
  Filled 2010-11-25: qty 1

## 2010-11-25 MED ORDER — FLUOROURACIL CHEMO INJECTION 2.5 GM/50ML
400.0000 mg/m2 | Freq: Once | INTRAVENOUS | Status: AC
  Administered 2010-11-25: 900 mg via INTRAVENOUS
  Filled 2010-11-25 (×2): qty 18

## 2010-11-25 MED ORDER — LEUCOVORIN CALCIUM INJECTION 100 MG
46.0000 mg | Freq: Once | INTRAMUSCULAR | Status: AC
  Administered 2010-11-25: 46 mg via INTRAVENOUS
  Filled 2010-11-25 (×2): qty 2.3

## 2010-11-25 MED ORDER — SODIUM CHLORIDE 0.9 % IV SOLN
Freq: Once | INTRAVENOUS | Status: AC
  Administered 2010-11-25: 24 mg via INTRAVENOUS
  Filled 2010-11-25: qty 12

## 2010-11-26 ENCOUNTER — Encounter (HOSPITAL_BASED_OUTPATIENT_CLINIC_OR_DEPARTMENT_OTHER): Payer: Managed Care, Other (non HMO)

## 2010-11-26 VITALS — BP 152/93 | HR 87 | Temp 97.3°F

## 2010-11-26 DIAGNOSIS — Z452 Encounter for adjustment and management of vascular access device: Secondary | ICD-10-CM

## 2010-11-26 DIAGNOSIS — C189 Malignant neoplasm of colon, unspecified: Secondary | ICD-10-CM

## 2010-11-26 MED ORDER — SODIUM CHLORIDE 0.9 % IJ SOLN
INTRAMUSCULAR | Status: AC
  Administered 2010-11-26: 10 mL via INTRAVENOUS
  Filled 2010-11-26: qty 10

## 2010-11-26 MED ORDER — HEPARIN SOD (PORK) LOCK FLUSH 100 UNIT/ML IV SOLN
INTRAVENOUS | Status: AC
  Administered 2010-11-26: 500 [IU] via INTRAVENOUS
  Filled 2010-11-26: qty 5

## 2010-11-26 MED ORDER — SODIUM CHLORIDE 0.9 % IJ SOLN
10.0000 mL | Freq: Once | INTRAMUSCULAR | Status: AC
  Administered 2010-11-26: 10 mL via INTRAVENOUS
  Filled 2010-11-26: qty 10

## 2010-11-26 MED ORDER — HEPARIN SOD (PORK) LOCK FLUSH 100 UNIT/ML IV SOLN
500.0000 [IU] | Freq: Once | INTRAVENOUS | Status: AC
  Administered 2010-11-26: 500 [IU] via INTRAVENOUS
  Filled 2010-11-26: qty 5

## 2010-11-26 NOTE — Progress Notes (Signed)
Martin Maldonado presented for Portacath flush and pump  D/C.Marland Kitchen Proper placement of portacath confirmed by CXR. Portacath located rt chest wall accessed with  H 20 needle previous visit. Good blood return present. Portacath flushed with 20ml NS and 500U/37ml Heparin and needle removed intact. Procedure without incident. Patient tolerated procedure well.

## 2010-12-08 ENCOUNTER — Encounter (HOSPITAL_BASED_OUTPATIENT_CLINIC_OR_DEPARTMENT_OTHER): Payer: Managed Care, Other (non HMO)

## 2010-12-08 DIAGNOSIS — C189 Malignant neoplasm of colon, unspecified: Secondary | ICD-10-CM

## 2010-12-08 LAB — COMPREHENSIVE METABOLIC PANEL
Albumin: 3.8 g/dL (ref 3.5–5.2)
BUN: 15 mg/dL (ref 6–23)
Chloride: 101 mEq/L (ref 96–112)
Creatinine, Ser: 1.12 mg/dL (ref 0.50–1.35)
GFR calc Af Amer: >60 mL/min (ref >60)
Glucose, Bld: 119 mg/dL — ABNORMAL HIGH (ref 70–99)
Total Bilirubin: 0.3 mg/dL (ref 0.3–1.2)
Total Protein: 7 g/dL (ref 6.0–8.3)

## 2010-12-08 LAB — CBC
MCH: 32.1 pg (ref 26.0–34.0)
MCV: 96.6 fL (ref 78.0–100.0)
Platelets: 227 10*3/uL (ref 150–400)
RDW: 15.5 % (ref 11.5–15.5)

## 2010-12-08 LAB — DIFFERENTIAL
Basophils Relative: 1 % (ref 0–1)
Monocytes Relative: 7 % (ref 3–12)
Neutro Abs: 4.9 10*3/uL (ref 1.7–7.7)
Neutrophils Relative %: 67 % (ref 43–77)

## 2010-12-08 NOTE — Progress Notes (Signed)
Labs drawn today for cmp,cea,cbc/diff 

## 2010-12-09 ENCOUNTER — Encounter (HOSPITAL_BASED_OUTPATIENT_CLINIC_OR_DEPARTMENT_OTHER): Payer: Managed Care, Other (non HMO)

## 2010-12-09 VITALS — BP 163/100 | HR 73 | Temp 97.2°F | Ht 73.0 in | Wt 214.8 lb

## 2010-12-09 DIAGNOSIS — C19 Malignant neoplasm of rectosigmoid junction: Secondary | ICD-10-CM

## 2010-12-09 DIAGNOSIS — Z5111 Encounter for antineoplastic chemotherapy: Secondary | ICD-10-CM

## 2010-12-09 DIAGNOSIS — C7800 Secondary malignant neoplasm of unspecified lung: Secondary | ICD-10-CM

## 2010-12-09 DIAGNOSIS — C189 Malignant neoplasm of colon, unspecified: Secondary | ICD-10-CM

## 2010-12-09 LAB — URINALYSIS, DIPSTICK ONLY
Leukocytes, UA: NEGATIVE
Nitrite: NEGATIVE
Protein, ur: NEGATIVE mg/dL
Specific Gravity, Urine: >1.03 — ABNORMAL HIGH (ref 1.005–1.030)
Urobilinogen, UA: 0.2 mg/dL (ref 0.0–1.0)

## 2010-12-09 MED ORDER — LEUCOVORIN CALCIUM INJECTION 100 MG
46.0000 mg | Freq: Once | INTRAMUSCULAR | Status: AC
  Administered 2010-12-09: 46 mg via INTRAVENOUS
  Filled 2010-12-09: qty 2.3

## 2010-12-09 MED ORDER — SODIUM CHLORIDE 0.9 % IJ SOLN
10.0000 mL | INTRAMUSCULAR | Status: DC | PRN
  Administered 2010-12-09: 10 mL
  Filled 2010-12-09: qty 10

## 2010-12-09 MED ORDER — FLUOROURACIL CHEMO INJECTION 2.5 GM/50ML
400.0000 mg/m2 | Freq: Once | INTRAVENOUS | Status: AC
  Administered 2010-12-09: 900 mg via INTRAVENOUS
  Filled 2010-12-09: qty 18

## 2010-12-09 MED ORDER — LORAZEPAM 2 MG/ML IJ SOLN
INTRAMUSCULAR | Status: AC
  Administered 2010-12-09: 1 mg via INTRAVENOUS
  Filled 2010-12-09: qty 1

## 2010-12-09 MED ORDER — LORAZEPAM 2 MG/ML IJ SOLN
1.0000 mg | Freq: Once | INTRAMUSCULAR | Status: AC
  Administered 2010-12-09: 1 mg via INTRAVENOUS

## 2010-12-09 MED ORDER — DEXTROSE 5 % IV SOLN
180.0000 mg/m2 | Freq: Once | INTRAVENOUS | Status: AC
  Administered 2010-12-09: 400 mg via INTRAVENOUS
  Filled 2010-12-09: qty 20

## 2010-12-09 MED ORDER — SODIUM CHLORIDE 0.9 % IJ SOLN
INTRAMUSCULAR | Status: AC
  Administered 2010-12-09: 10 mL
  Filled 2010-12-09: qty 10

## 2010-12-09 MED ORDER — SODIUM CHLORIDE 0.9 % IV SOLN
485.0000 mg | Freq: Once | INTRAVENOUS | Status: AC
  Administered 2010-12-09: 475 mg via INTRAVENOUS
  Filled 2010-12-09: qty 19

## 2010-12-09 MED ORDER — ONDANSETRON 8 MG/50ML IVPB (CHCC): 24.0000 mg | Freq: Once | INTRAVENOUS | Status: DC

## 2010-12-09 MED ORDER — DEXAMETHASONE SODIUM PHOSPHATE 4 MG/ML IJ SOLN: 16.0000 mg | Freq: Once | INTRAMUSCULAR | Status: DC

## 2010-12-09 MED ORDER — SODIUM CHLORIDE 0.9 % IV SOLN
5350.0000 mg | INTRAVENOUS | Status: DC
  Administered 2010-12-09: 5350 mg via INTRAVENOUS
  Filled 2010-12-09: qty 107

## 2010-12-09 MED ORDER — SODIUM CHLORIDE 0.9 % IV SOLN
Freq: Once | INTRAVENOUS | Status: AC
  Administered 2010-12-09: 500 mL via INTRAVENOUS

## 2010-12-09 MED ORDER — ONDANSETRON HCL 4 MG/2ML IJ SOLN
Freq: Once | INTRAMUSCULAR | Status: AC
  Administered 2010-12-09: 24 mg via INTRAVENOUS
  Filled 2010-12-09: qty 12

## 2010-12-09 MED ORDER — HEPARIN SOD (PORK) LOCK FLUSH 100 UNIT/ML IV SOLN
500.0000 [IU] | Freq: Once | INTRAVENOUS | Status: DC | PRN
  Filled 2010-12-09: qty 5

## 2010-12-09 MED ORDER — LEUCOVORIN CALCIUM INJECTION 350 MG: 45.0000 mg | Freq: Once | INTRAVENOUS | Status: DC

## 2010-12-09 NOTE — Progress Notes (Signed)
[facility] Discharge Instructions for Patients Receiving Chemotherapy  Today you received the following chemotherapy agents Avastin, irinotecan, leucovorin & 5 FU IV push and continuous infusion for 24 hours.  To help prevent nausea and vomiting after your treatment, we encourage you to take your nausea medication Ativan 1mg  every 3 to 4 hours as needed for nausea or vomiting Begin taking it at as needed and take it as often as prescribed for the next 48 hours.   If you develop nausea and vomiting that is not controlled by your nausea medication, call the clinic. If it is after clinic hours your family physician or the after hours number for the clinic or go to the Emergency Department.   BELOW ARE SYMPTOMS THAT SHOULD BE REPORTED IMMEDIATELY:  *FEVER GREATER THAN 101.0 F  *CHILLS WITH OR WITHOUT FEVER  NAUSEA AND VOMITING THAT IS NOT CONTROLLED WITH YOUR NAUSEA MEDICATION  *UNUSUAL SHORTNESS OF BREATH  *UNUSUAL BRUISING OR BLEEDING  TENDERNESS IN MOUTH AND THROAT WITH OR WITHOUT PRESENCE OF ULCERS  *URINARY PROBLEMS  *BOWEL PROBLEMS  UNUSUAL RASH Items with * indicate a potential emergency and should be followed up as soon as possible.  One of the nurses will contact you 24 hours after your treatment. Please let the nurse know about any problems that you may have experienced. Feel free to call the clinic you have any questions or concerns. The clinic phone number is [phone].   I have been informed and understand all the instructions given to me. I know to contact the clinic, my physician, or go to the Emergency Department if any problems should occur. I do not have any questions at this time, but understand that I may call the clinic during office hours or the Patient Navigator at [phone] should I have any questions or need assistance in obtaining follow up care.    __________________________________________  _____________  __________ Signature of  Patient or Authorized Representative            Date                   Time    __________________________________________ Nurse's Signature

## 2010-12-10 ENCOUNTER — Encounter (HOSPITAL_BASED_OUTPATIENT_CLINIC_OR_DEPARTMENT_OTHER): Payer: Managed Care, Other (non HMO)

## 2010-12-10 DIAGNOSIS — C189 Malignant neoplasm of colon, unspecified: Secondary | ICD-10-CM

## 2010-12-10 LAB — CEA: CEA: 3.5

## 2010-12-10 MED ORDER — HEPARIN SOD (PORK) LOCK FLUSH 100 UNIT/ML IV SOLN
INTRAVENOUS | Status: AC
  Administered 2010-12-10: 500 [IU]
  Filled 2010-12-10: qty 5

## 2010-12-10 MED ORDER — HEPARIN SOD (PORK) LOCK FLUSH 100 UNIT/ML IV SOLN
500.0000 [IU] | Freq: Once | INTRAVENOUS | Status: AC | PRN
  Administered 2010-12-10: 500 [IU]
  Filled 2010-12-10: qty 5

## 2010-12-10 MED ORDER — SODIUM CHLORIDE 0.9 % IJ SOLN
10.0000 mL | INTRAMUSCULAR | Status: DC | PRN
  Administered 2010-12-10: 10 mL
  Filled 2010-12-10: qty 10

## 2010-12-10 MED ORDER — SODIUM CHLORIDE 0.9 % IJ SOLN
INTRAMUSCULAR | Status: AC
  Administered 2010-12-10: 10 mL
  Filled 2010-12-10: qty 10

## 2010-12-10 NOTE — Progress Notes (Signed)
24 Hour Chemotherapy Follow up Call  Martin Maldonado came to department for pump d/c.  Denies any nausea or vomiting.  Instructed if nausea occurs and is uncontrolled by antiemetics to call clinic and let us know.    Reviewed all post chemotherapy instructions with patient and when should call clinic or MD.  Patient verbalized understanding.

## 2010-12-11 LAB — DIFFERENTIAL
Basophils Absolute: 0
Basophils Absolute: 0.1
Basophils Absolute: 0
Basophils Absolute: 0
Basophils Absolute: 0
Basophils Absolute: 0
Basophils Absolute: 0
Basophils Absolute: 0
Basophils Absolute: 0
Basophils Absolute: 0.1
Basophils Absolute: 0.1
Basophils Relative: 0
Basophils Relative: 0
Basophils Relative: 0
Basophils Relative: 0
Basophils Relative: 0
Basophils Relative: 0
Basophils Relative: 0
Basophils Relative: 1
Basophils Relative: 1
Eosinophils Absolute: 0
Eosinophils Absolute: 0
Eosinophils Absolute: 0.1
Eosinophils Absolute: 0.1
Eosinophils Absolute: 0.1
Eosinophils Relative: 0
Eosinophils Relative: 0
Eosinophils Relative: 0
Eosinophils Relative: 1
Eosinophils Relative: 1
Eosinophils Relative: 1
Lymphocytes Relative: 11 — ABNORMAL LOW
Lymphocytes Relative: 5 — ABNORMAL LOW
Lymphocytes Relative: 6 — ABNORMAL LOW
Lymphocytes Relative: 6 — ABNORMAL LOW
Lymphocytes Relative: 6 — ABNORMAL LOW
Lymphocytes Relative: 7 — ABNORMAL LOW
Lymphocytes Relative: 7 — ABNORMAL LOW
Lymphocytes Relative: 7 — ABNORMAL LOW
Lymphocytes Relative: 8 — ABNORMAL LOW
Lymphs Abs: 0.5 — ABNORMAL LOW
Lymphs Abs: 0.7
Lymphs Abs: 0.5 — ABNORMAL LOW
Lymphs Abs: 0.6 — ABNORMAL LOW
Lymphs Abs: 0.9
Lymphs Abs: 1
Lymphs Abs: 1.2
Monocytes Absolute: 0.6
Monocytes Absolute: 0.6
Monocytes Absolute: 0.9
Monocytes Absolute: 0.9
Monocytes Absolute: 1.2 — ABNORMAL HIGH
Monocytes Absolute: 1.4 — ABNORMAL HIGH
Monocytes Absolute: 1.4 — ABNORMAL HIGH
Monocytes Relative: 11
Monocytes Relative: 20 — ABNORMAL HIGH
Monocytes Relative: 18 — ABNORMAL HIGH
Monocytes Relative: 6
Monocytes Relative: 8
Neutro Abs: 1.9
Neutro Abs: 5.6
Neutro Abs: 1.9
Neutro Abs: 11 — ABNORMAL HIGH
Neutro Abs: 11.1 — ABNORMAL HIGH
Neutro Abs: 12.7 — ABNORMAL HIGH
Neutro Abs: 13.3 — ABNORMAL HIGH
Neutro Abs: 5.9
Neutro Abs: 7.6
Neutro Abs: 9.9 — ABNORMAL HIGH
Neutro Abs: 9.9 — ABNORMAL HIGH
Neutrophils Relative %: 82 — ABNORMAL HIGH
Neutrophils Relative %: 80 — ABNORMAL HIGH
Neutrophils Relative %: 82 — ABNORMAL HIGH
Neutrophils Relative %: 83 — ABNORMAL HIGH
Neutrophils Relative %: 84 — ABNORMAL HIGH
Neutrophils Relative %: 87 — ABNORMAL HIGH
Neutrophils Relative %: 87 — ABNORMAL HIGH

## 2010-12-11 LAB — CBC
HCT: 43.6
HCT: 30 — ABNORMAL LOW
HCT: 32.6 — ABNORMAL LOW
HCT: 33.3 — ABNORMAL LOW
HCT: 33.7 — ABNORMAL LOW
HCT: 41.4
Hemoglobin: 12.8 — ABNORMAL LOW
Hemoglobin: 10.4 — ABNORMAL LOW
Hemoglobin: 10.9 — ABNORMAL LOW
Hemoglobin: 11 — ABNORMAL LOW
Hemoglobin: 11.3 — ABNORMAL LOW
Hemoglobin: 11.3 — ABNORMAL LOW
Hemoglobin: 12.2 — ABNORMAL LOW
Hemoglobin: 14.3
MCHC: 34.8
MCHC: 35.8
MCHC: 34.4
MCHC: 34.5
MCHC: 34.6
MCHC: 34.6
MCHC: 34.8
MCHC: 34.8
MCHC: 35.1
MCHC: 35.2
MCV: 89.3
MCV: 88.3
MCV: 88.7
MCV: 88.9
MCV: 89.3
MCV: 89.3
Platelets: 263
Platelets: 286
Platelets: 296
Platelets: 328
Platelets: 359
Platelets: 369
Platelets: 398
Platelets: 455 — ABNORMAL HIGH
Platelets: 597 — ABNORMAL HIGH
Platelets: 602 — ABNORMAL HIGH
RBC: 4.07 — ABNORMAL LOW
RBC: 4.44
RBC: 3.55 — ABNORMAL LOW
RBC: 3.79 — ABNORMAL LOW
RBC: 3.92 — ABNORMAL LOW
RBC: 3.99 — ABNORMAL LOW
RDW: 13.6
RDW: 13.7
RDW: 13.8
RDW: 13.9
RDW: 14.2
RDW: 14.2
RDW: 14.2
RDW: 14.3
RDW: 14.3
RDW: 14.6
WBC: 3.1 — ABNORMAL LOW
WBC: 10.6 — ABNORMAL HIGH
WBC: 14.8 — ABNORMAL HIGH
WBC: 3.6 — ABNORMAL LOW
WBC: 7.5
WBC: 9.3

## 2010-12-11 LAB — CULTURE, ROUTINE-ABSCESS: Culture: NO GROWTH

## 2010-12-11 LAB — CULTURE, BLOOD (ROUTINE X 2)
Culture: NO GROWTH
Report Status: 2272009

## 2010-12-11 LAB — BASIC METABOLIC PANEL
BUN: 18
BUN: 12
BUN: 13
BUN: 13
BUN: 15
BUN: 15
BUN: 19
BUN: 21
CO2: 28
CO2: 29
CO2: 25
CO2: 25
CO2: 26
CO2: 27
CO2: 29
Calcium: 8.3 — ABNORMAL LOW
Calcium: 8.4
Calcium: 9.2
Calcium: 8 — ABNORMAL LOW
Calcium: 8 — ABNORMAL LOW
Calcium: 8.2 — ABNORMAL LOW
Calcium: 8.2 — ABNORMAL LOW
Calcium: 8.4
Calcium: 8.6
Calcium: 8.6
Chloride: 102
Chloride: 100
Chloride: 102
Chloride: 107
Chloride: 108
Creatinine, Ser: 1.06
Creatinine, Ser: 1.22
Creatinine, Ser: 0.85
Creatinine, Ser: 0.88
Creatinine, Ser: 0.97
Creatinine, Ser: 0.97
Creatinine, Ser: 1
Creatinine, Ser: 1.12
Creatinine, Ser: 1.38
Creatinine, Ser: 1.75 — ABNORMAL HIGH
GFR calc Af Amer: >60
GFR calc Af Amer: >60
GFR calc Af Amer: >60
GFR calc Af Amer: 50 — ABNORMAL LOW
GFR calc Af Amer: >60
GFR calc Af Amer: >60
GFR calc Af Amer: >60
GFR calc Af Amer: >60
GFR calc Af Amer: >60
GFR calc Af Amer: >60
GFR calc non Af Amer: >60
GFR calc non Af Amer: >60
GFR calc non Af Amer: 42 — ABNORMAL LOW
GFR calc non Af Amer: 44 — ABNORMAL LOW
GFR calc non Af Amer: >60
GFR calc non Af Amer: >60
GFR calc non Af Amer: >60
GFR calc non Af Amer: >60
GFR calc non Af Amer: >60
GFR calc non Af Amer: >60
GFR calc non Af Amer: >60
Glucose, Bld: 123 — ABNORMAL HIGH
Glucose, Bld: 125 — ABNORMAL HIGH
Glucose, Bld: 140 — ABNORMAL HIGH
Potassium: 4
Potassium: 4.6
Potassium: 4.9
Sodium: 139
Sodium: 131 — ABNORMAL LOW
Sodium: 132 — ABNORMAL LOW
Sodium: 136
Sodium: 137
Sodium: 137

## 2010-12-11 LAB — PHOSPHORUS
Phosphorus: 1.8 — ABNORMAL LOW
Phosphorus: 2.5
Phosphorus: 3.3

## 2010-12-11 LAB — URINALYSIS, ROUTINE W REFLEX MICROSCOPIC
Bilirubin Urine: NEGATIVE
Leukocytes, UA: NEGATIVE
Nitrite: NEGATIVE
Specific Gravity, Urine: 1.015
Urobilinogen, UA: 0.2

## 2010-12-11 LAB — WOUND CULTURE

## 2010-12-11 LAB — HEPATIC FUNCTION PANEL
ALT: 22
Alkaline Phosphatase: 125 — ABNORMAL HIGH
Indirect Bilirubin: 0.7
Total Protein: 6

## 2010-12-11 LAB — ALBUMIN
Albumin: 3.2 — ABNORMAL LOW
Albumin: 2.7 — ABNORMAL LOW

## 2010-12-11 LAB — MAGNESIUM
Magnesium: 1.7
Magnesium: 2.1

## 2010-12-11 LAB — URINE CULTURE: Special Requests: NEGATIVE

## 2010-12-11 LAB — ANAEROBIC CULTURE

## 2010-12-11 LAB — URINE MICROSCOPIC-ADD ON

## 2010-12-11 LAB — AMYLASE: Amylase: 28

## 2010-12-14 LAB — BASIC METABOLIC PANEL
GFR calc Af Amer: >60
GFR calc non Af Amer: >60
Glucose, Bld: 97
Potassium: 4
Sodium: 131 — ABNORMAL LOW

## 2010-12-14 LAB — DIFFERENTIAL
Band Neutrophils: 0
Basophils Relative: 0
Blasts: 0
Eosinophils Relative: 0
Lymphocytes Relative: 14
Metamyelocytes Relative: 0
Monocytes Relative: 9

## 2010-12-14 LAB — CBC
HCT: 32.8 — ABNORMAL LOW
Hemoglobin: 11 — ABNORMAL LOW
RBC: 3.67 — ABNORMAL LOW
RDW: 14

## 2010-12-14 LAB — CALCIUM, IONIZED: Calcium, Ion: 1.23

## 2010-12-14 LAB — PHOSPHORUS: Phosphorus: 3.7

## 2010-12-14 LAB — MAGNESIUM: Magnesium: 1.7

## 2010-12-15 LAB — CEA: CEA: 2.9

## 2010-12-16 ENCOUNTER — Encounter (HOSPITAL_BASED_OUTPATIENT_CLINIC_OR_DEPARTMENT_OTHER): Payer: Managed Care, Other (non HMO) | Admitting: Oncology

## 2010-12-16 ENCOUNTER — Encounter (HOSPITAL_COMMUNITY): Payer: Self-pay | Admitting: Oncology

## 2010-12-16 VITALS — BP 158/90 | HR 85 | Temp 98.0°F | Wt 214.4 lb

## 2010-12-16 DIAGNOSIS — C20 Malignant neoplasm of rectum: Secondary | ICD-10-CM

## 2010-12-16 DIAGNOSIS — C50919 Malignant neoplasm of unspecified site of unspecified female breast: Secondary | ICD-10-CM

## 2010-12-16 DIAGNOSIS — C779 Secondary and unspecified malignant neoplasm of lymph node, unspecified: Secondary | ICD-10-CM

## 2010-12-16 DIAGNOSIS — C7800 Secondary malignant neoplasm of unspecified lung: Secondary | ICD-10-CM

## 2010-12-16 DIAGNOSIS — C189 Malignant neoplasm of colon, unspecified: Secondary | ICD-10-CM

## 2010-12-16 NOTE — Progress Notes (Signed)
CC:   RRoetta Sessions, MD FACP Richardean Sale, MD Gerrit Friends. Dietrich Pates, MD, [facility] Dalia Heading, M.D.  DIAGNOSES: 1. 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  He has had 8 doses of a planned 12     but would like a break in therapy if possible. 2. History of cancer of the rectum status post resection on 05/09/2006     by Dr. 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  We therefore treated him as if he had extensive local     regional disease and probably persistence of disease and I treated     him at that time with chemotherapy consisting of postoperative     radiation therapy with concomitant capecitabine and 6 cycles of     oxaliplatin and capecitabine and Avastin. 3. Coronary artery bypass graft on 06/09/2009. 4. He is still smoking occasionally. 5. Small bowel obstruction requiring surgery in February 2009. 6. Chronic back pain. 7. Port-A-Cath placement in the past. 8. History of ethanol abuse many years ago.  Stopped drinking some     time ago.  Dj is here today.  He still has peripheral neuropathy, probably grade 1, at times perhaps grade 2 but definitely grade 1 in the distal half of his fingers and all of his toes.  He does not have any trouble walking, realistically still functioning at home but he is on disability.  He had numerous lung mets and they have responded extremely well to therapy. His last CT scan showed at least 50% plus response.  He would however like a break in therapy.  We had planned for 12 doses of this regimen.  He has tolerated it, I thought, pretty well.  He certainly is looking for maintenance of quality of life as best he can.  PHYSICAL  EXAMINATION:  Vital signs:  Very, very stable.  Weight is 214 pounds, 6 feet 1 inch tall.  He is afebrile.  Blood pressure 158/90 left arm sitting position, pulse 80 and regular, respirations 16 and unlabored.  He has no lymphadenopathy.  Ports in place.  Lungs:  Clear but with diminished breath sounds throughout, hyperresonance to percussion.  Heart:  Shows a regular rhythm and rate without murmur, rub or gallop.  Abdomen:  Soft, nontender without hepatosplenomegaly or masses.  Bowel sounds are normal.  He has no peripheral edema.  So he looks good.  He has peripheral neuropathy and it is mild but definite.  He would like a break, so what I have told him is I would like to talk to Valetta Close about whether or not at some point in the near future Doristine Church would like to see him for consideration of a trial.  Jaevian would like to pursue this, so I put in a call in today to Dr. Valetta Close at [facility] and he will hopefully call me back on Friday.  In the meantime, we will tentatively stop his chemotherapy and repeat his CT scan in late October as well as blood work in October and I will see him in early November if we pursue  this plan of therapy.    ______________________________ Ladona Horns. Mariel Sleet, MD ESN/MEDQ  D:  12/16/2010  T:  12/16/2010  Job:  [id number]

## 2010-12-16 NOTE — Patient Instructions (Signed)
[facility]  Discharge Instructions  RECOMMENDATIONS MADE BY THE CONSULTANT AND ANY TEST RESULTS WILL BE SENT TO YOUR REFERRING DOCTOR.   EXAM FINDINGS BY MD TODAY AND SIGNS AND SYMPTOMS TO REPORT TO CLINIC OR PRIMARY MD: You can take a break from chemo.    SPECIAL INSTRUCTIONS/FOLLOW-UP: Need port flush every 6 weeks. CT Scan the end of October and then see Dr.Neijstrom beginning of November.   I acknowledge that I have been informed and understand all the instructions given to me and received a copy. I do not have any more questions at this time, but understand that I may call the Specialty Clinic at [facility] at [phone] during business hours should I have any further questions or need assistance in obtaining follow-up care.    __________________________________________  _____________  __________ Signature of Patient or Authorized Representative            Date                   Time    __________________________________________ Nurse's Signature

## 2010-12-16 NOTE — Progress Notes (Signed)
This office note has been dictated.

## 2010-12-17 LAB — CEA: CEA: 4.2

## 2010-12-21 LAB — CBC
MCHC: 34.4
Platelets: 260
RDW: 14.2

## 2010-12-21 LAB — DIFFERENTIAL
Eosinophils Relative: 3
Lymphocytes Relative: 20
Lymphs Abs: 1.4
Monocytes Absolute: 0.5
Monocytes Relative: 7

## 2010-12-21 LAB — COMPREHENSIVE METABOLIC PANEL
AST: 19
Albumin: 4.3
Calcium: 9.2
Creatinine, Ser: 1.02
GFR calc Af Amer: >60
GFR calc non Af Amer: >60
Sodium: 138
Total Protein: 7.3

## 2010-12-21 LAB — CEA: CEA: 3.6

## 2010-12-22 ENCOUNTER — Other Ambulatory Visit (HOSPITAL_COMMUNITY): Payer: Managed Care, Other (non HMO)

## 2010-12-23 ENCOUNTER — Inpatient Hospital Stay (HOSPITAL_COMMUNITY): Payer: Managed Care, Other (non HMO)

## 2010-12-25 LAB — CBC
HCT: 40.8 % (ref 39.0–52.0)
MCHC: 34.9 g/dL (ref 30.0–36.0)
MCV: 88.9 fL (ref 78.0–100.0)
Platelets: 258 10*3/uL (ref 150–400)
WBC: 7.1 10*3/uL (ref 4.0–10.5)

## 2010-12-25 LAB — COMPREHENSIVE METABOLIC PANEL
Alkaline Phosphatase: 84 U/L (ref 39–117)
BUN: 17 mg/dL (ref 6–23)
Glucose, Bld: 106 mg/dL — ABNORMAL HIGH (ref 70–99)
Potassium: 3.8 mEq/L (ref 3.5–5.1)
Total Bilirubin: 0.4 mg/dL (ref 0.3–1.2)
Total Protein: 6.6 g/dL (ref 6.0–8.3)

## 2010-12-25 LAB — DIFFERENTIAL
Basophils Absolute: 0 10*3/uL (ref 0.0–0.1)
Basophils Relative: 0 % (ref 0–1)
Monocytes Relative: 5 % (ref 3–12)
Neutro Abs: 5.2 10*3/uL (ref 1.7–7.7)
Neutrophils Relative %: 74 % (ref 43–77)

## 2010-12-31 LAB — CBC
HCT: 42.1
Hemoglobin: 15.8
Platelets: 180
RBC: 4.59
RDW: 18.4 — ABNORMAL HIGH

## 2010-12-31 LAB — COMPREHENSIVE METABOLIC PANEL
ALT: 29
AST: 24
CO2: 29
Chloride: 105
GFR calc Af Amer: >60
GFR calc non Af Amer: >60
Potassium: 3.9
Sodium: 138
Total Bilirubin: 0.7

## 2010-12-31 LAB — DIFFERENTIAL
Basophils Absolute: 0
Basophils Relative: 1
Eosinophils Relative: 5
Lymphocytes Relative: 16
Neutro Abs: 3.2

## 2011-01-01 LAB — CBC
HCT: 42.1
HCT: 42.4
HCT: 41.1
Hemoglobin: 14.6
MCHC: 34.5
MCHC: 34.8
MCV: 98.4
MCV: 98
Platelets: 183
Platelets: 196
Platelets: 177
Platelets: 196
RBC: 4.31
RBC: 4.14 — ABNORMAL LOW
RDW: 19.3 — ABNORMAL HIGH
WBC: 5.8
WBC: 6
WBC: 5.3

## 2011-01-01 LAB — DIFFERENTIAL
Basophils Relative: 1
Basophils Relative: 1
Eosinophils Absolute: 0.2
Eosinophils Absolute: 0.2
Eosinophils Absolute: 0.2
Eosinophils Relative: 4
Eosinophils Relative: 5
Eosinophils Relative: 3
Lymphocytes Relative: 20
Lymphs Abs: 0.8
Lymphs Abs: 1.2
Lymphs Abs: 1.1
Monocytes Absolute: 0.4
Monocytes Relative: 6
Monocytes Relative: 9
Neutro Abs: 3.8
Neutro Abs: 3.5
Neutrophils Relative %: 67
Neutrophils Relative %: 73

## 2011-01-04 LAB — COMPREHENSIVE METABOLIC PANEL
ALT: 26
ALT: 31
Alkaline Phosphatase: 84
BUN: 10
BUN: 12
CO2: 26
CO2: 29
Calcium: 9.5
Chloride: 107
Creatinine, Ser: 1.05
GFR calc non Af Amer: >60
GFR calc non Af Amer: >60
Glucose, Bld: 77
Glucose, Bld: 82
Potassium: 4.7
Sodium: 138
Sodium: 139
Total Bilirubin: 0.7
Total Protein: 7

## 2011-01-04 LAB — CBC
HCT: 40.7
HCT: 41.2
Hemoglobin: 13.9
Hemoglobin: 14.1
MCHC: 34.3
MCV: 98.3
RBC: 4.14 — ABNORMAL LOW
RBC: 4.2 — ABNORMAL LOW
RDW: 19.2 — ABNORMAL HIGH
RDW: 21 — ABNORMAL HIGH

## 2011-01-04 LAB — URINALYSIS, DIPSTICK ONLY
Bilirubin Urine: NEGATIVE
Glucose, UA: NEGATIVE
Glucose, UA: NEGATIVE
Ketones, ur: NEGATIVE
Ketones, ur: NEGATIVE
Leukocytes, UA: NEGATIVE
Leukocytes, UA: NEGATIVE
Nitrite: NEGATIVE
Nitrite: NEGATIVE
Specific Gravity, Urine: 1.01
Specific Gravity, Urine: 1.02
pH: 6.5
pH: 6.5

## 2011-01-04 LAB — DIFFERENTIAL
Basophils Relative: 2 — ABNORMAL HIGH
Eosinophils Absolute: 0.3
Eosinophils Absolute: 0.4
Eosinophils Relative: 6 — ABNORMAL HIGH
Lymphocytes Relative: 25
Lymphs Abs: 2
Monocytes Relative: 9
Neutro Abs: 3.5
Neutro Abs: 3.8
Neutrophils Relative %: 55
Neutrophils Relative %: 57

## 2011-01-05 LAB — COMPREHENSIVE METABOLIC PANEL
AST: 32
Albumin: 3.7
Calcium: 9.1
Creatinine, Ser: 0.99
Potassium: 4.2
Sodium: 139
Total Bilirubin: 0.6
Total Protein: 6.1

## 2011-01-05 LAB — URINALYSIS, DIPSTICK ONLY
Bilirubin Urine: NEGATIVE
Glucose, UA: 500 — AB
Hgb urine dipstick: NEGATIVE
Ketones, ur: NEGATIVE
Protein, ur: NEGATIVE
Urobilinogen, UA: 1

## 2011-01-05 LAB — CBC
HCT: 38 — ABNORMAL LOW
Hemoglobin: 13.1
MCHC: 34.4
RBC: 4.14 — ABNORMAL LOW

## 2011-01-05 LAB — DIFFERENTIAL
Eosinophils Relative: 3
Lymphocytes Relative: 22
Lymphs Abs: 1.3
Monocytes Absolute: 0.5
Monocytes Relative: 9

## 2011-01-06 LAB — URINALYSIS, DIPSTICK ONLY
Bilirubin Urine: NEGATIVE
Glucose, UA: NEGATIVE
Hgb urine dipstick: NEGATIVE
Specific Gravity, Urine: >1.03 — ABNORMAL HIGH
Urobilinogen, UA: 0.2

## 2011-01-06 LAB — DIFFERENTIAL
Basophils Absolute: 0.1
Eosinophils Relative: 3
Lymphocytes Relative: 25
Lymphs Abs: 1.5
Monocytes Relative: 11

## 2011-01-06 LAB — COMPREHENSIVE METABOLIC PANEL
AST: 30
Albumin: 3.9
BUN: 11
CO2: 23
Calcium: 8.9
Creatinine, Ser: 1.01
GFR calc Af Amer: >60
GFR calc non Af Amer: >60
Total Bilirubin: 0.8

## 2011-01-06 LAB — CBC
HCT: 37.3 — ABNORMAL LOW
MCHC: 35.4
MCV: 85.8
Platelets: 205

## 2011-01-15 ENCOUNTER — Encounter (HOSPITAL_COMMUNITY): Payer: Managed Care, Other (non HMO) | Attending: Oncology

## 2011-01-15 ENCOUNTER — Other Ambulatory Visit (HOSPITAL_COMMUNITY): Payer: Managed Care, Other (non HMO)

## 2011-01-15 DIAGNOSIS — C189 Malignant neoplasm of colon, unspecified: Secondary | ICD-10-CM | POA: Insufficient documentation

## 2011-01-15 DIAGNOSIS — Z0000 Encounter for general adult medical examination without abnormal findings: Secondary | ICD-10-CM | POA: Insufficient documentation

## 2011-01-15 DIAGNOSIS — C19 Malignant neoplasm of rectosigmoid junction: Secondary | ICD-10-CM

## 2011-01-15 LAB — DIFFERENTIAL
Eosinophils Absolute: 0.3 10*3/uL (ref 0.0–0.7)
Eosinophils Relative: 3 % (ref 0–5)
Lymphocytes Relative: 22 % (ref 12–46)
Lymphs Abs: 1.6 10*3/uL (ref 0.7–4.0)
Monocytes Absolute: 0.5 10*3/uL (ref 0.1–1.0)
Monocytes Relative: 7 % (ref 3–12)

## 2011-01-15 LAB — COMPREHENSIVE METABOLIC PANEL
ALT: 19 U/L (ref 0–53)
BUN: 17 mg/dL (ref 6–23)
CO2: 23 mEq/L (ref 19–32)
Calcium: 9.4 mg/dL (ref 8.4–10.5)
Creatinine, Ser: 1.31 mg/dL (ref 0.50–1.35)
GFR calc Af Amer: 70 mL/min — ABNORMAL LOW (ref >90)
GFR calc non Af Amer: 61 mL/min — ABNORMAL LOW (ref >90)
Glucose, Bld: 108 mg/dL — ABNORMAL HIGH (ref 70–99)
Sodium: 137 mEq/L (ref 135–145)
Total Protein: 7.2 g/dL (ref 6.0–8.3)

## 2011-01-15 LAB — CBC
HCT: 45.5 % (ref 39.0–52.0)
Hemoglobin: 14.7 g/dL (ref 13.0–17.0)
MCH: 30.6 pg (ref 26.0–34.0)
MCV: 94.8 fL (ref 78.0–100.0)
Platelets: 213 10*3/uL (ref 150–400)
RBC: 4.8 MIL/uL (ref 4.22–5.81)
WBC: 7.6 10*3/uL (ref 4.0–10.5)

## 2011-01-15 LAB — CEA: CEA: 4.3 ng/mL (ref 0.0–5.0)

## 2011-01-15 MED ORDER — SODIUM CHLORIDE 0.9 % IJ SOLN
10.0000 mL | Freq: Once | INTRAMUSCULAR | Status: AC
  Administered 2011-01-15: 10 mL via INTRAVENOUS
  Filled 2011-01-15: qty 10

## 2011-01-15 MED ORDER — HEPARIN SOD (PORK) LOCK FLUSH 100 UNIT/ML IV SOLN
INTRAVENOUS | Status: AC
  Administered 2011-01-15: 500 [IU]
  Filled 2011-01-15: qty 5

## 2011-01-15 NOTE — Progress Notes (Signed)
Martin Maldonado presented for Portacath access and flush. Proper placement of portacath confirmed by CXR. Portacath located RTchest wall accessed with  H 20 needle. Good blood return present. Portacath flushed with 20ml NS and 500U/25ml Heparin and needle removed intact. Procedure without incident. Patient tolerated procedure well.

## 2011-01-18 ENCOUNTER — Ambulatory Visit (HOSPITAL_COMMUNITY)
Admission: RE | Admit: 2011-01-18 | Discharge: 2011-01-18 | Disposition: A | Discharge status: Home / Self Care | Payer: Managed Care, Other (non HMO) | Source: Ambulatory Visit | Attending: Oncology | Admitting: Oncology

## 2011-01-18 DIAGNOSIS — J984 Other disorders of lung: Secondary | ICD-10-CM | POA: Insufficient documentation

## 2011-01-18 DIAGNOSIS — C7800 Secondary malignant neoplasm of unspecified lung: Secondary | ICD-10-CM | POA: Insufficient documentation

## 2011-01-18 DIAGNOSIS — C189 Malignant neoplasm of colon, unspecified: Secondary | ICD-10-CM

## 2011-01-18 DIAGNOSIS — K8020 Calculus of gallbladder without cholecystitis without obstruction: Secondary | ICD-10-CM | POA: Insufficient documentation

## 2011-01-18 DIAGNOSIS — C20 Malignant neoplasm of rectum: Secondary | ICD-10-CM | POA: Insufficient documentation

## 2011-01-19 ENCOUNTER — Encounter (HOSPITAL_COMMUNITY): Payer: Self-pay | Admitting: Oncology

## 2011-01-19 ENCOUNTER — Encounter (HOSPITAL_BASED_OUTPATIENT_CLINIC_OR_DEPARTMENT_OTHER): Payer: Managed Care, Other (non HMO) | Admitting: Oncology

## 2011-01-19 VITALS — BP 179/96 | HR 80 | Temp 97.5°F | Wt 211.4 lb

## 2011-01-19 DIAGNOSIS — Z23 Encounter for immunization: Secondary | ICD-10-CM

## 2011-01-19 DIAGNOSIS — C189 Malignant neoplasm of colon, unspecified: Secondary | ICD-10-CM

## 2011-01-19 DIAGNOSIS — Z0000 Encounter for general adult medical examination without abnormal findings: Secondary | ICD-10-CM

## 2011-01-19 DIAGNOSIS — C7800 Secondary malignant neoplasm of unspecified lung: Secondary | ICD-10-CM

## 2011-01-19 MED ORDER — INFLUENZA VIRUS VACC SPLIT PF IM SUSP
INTRAMUSCULAR | Status: AC
  Administered 2011-01-19: 0.5 mL via INTRAMUSCULAR
  Filled 2011-01-19: qty 0.5

## 2011-01-19 MED ORDER — INFLUENZA VIRUS VACC SPLIT PF IM SUSP
0.5000 mL | Freq: Once | INTRAMUSCULAR | Status: AC
  Administered 2011-01-19: 0.5 mL via INTRAMUSCULAR

## 2011-01-19 NOTE — Patient Instructions (Signed)
[facility] Clinic  Discharge Instructions  RECOMMENDATIONS MADE BY THE CONSULTANT AND ANY TEST RESULTS WILL BE SENT TO YOUR REFERRING DOCTOR.   EXAM FINDINGS BY MD TODAY AND SIGNS AND SYMPTOMS TO REPORT TO CLINIC OR PRIMARY MD: Per Jenita Seashore PA  MEDICATIONS PRESCRIBED: Flu shot today  INSTRUCTIONS GIVEN AND DISCUSSED:   SPECIAL INSTRUCTIONS/FOLLOW-UP: Xray Studies Needed in 3 months with lab work before Office visit in 3 months   I acknowledge that I have been informed and understand all the instructions given to me and received a copy. I do not have any more questions at this time, but understand that I may call the Specialty Clinic at [facility] at [phone] during business hours should I have any further questions or need assistance in obtaining follow-up care.    __________________________________________  _____________  __________ Signature of Patient or Authorized Representative            Date                   Time    __________________________________________ Nurse's Signature

## 2011-01-19 NOTE — Progress Notes (Signed)
Martin Maldonado presents today for injection per MD orders. Flu vaccine administered im in left Upper Arm. Administration without incident. Patient tolerated well.

## 2011-01-19 NOTE — Progress Notes (Signed)
[facility], MD [address]  [place] [address]  1. Colon cancer  CBC, Differential, Comprehensive metabolic panel, CEA, CT Chest W Contrast, CT Abdomen Pelvis W Contrast  2. Preventative health care  influenza  inactive virus vaccine (FLUZONE/FLUARIX) injection 0.5 mL    CURRENT THERAPY:S/P 8/12 planned FOLFIRI plus Avastin regimen.  Presently on break per patient request.   INTERVAL HISTORY: Martin Maldonado 55 y.o. male returns for  regular  visit for followup of metastatic colon cancer to lungs.  He denies any complaints today.  He denies a cough, shortness of breath, change in bowel habits, blood in stool, black tarry stool, fevers, chills, night sweats.  He denies any nausea, vomiting, constipation, diarrhea.  I personally reviewed and went over laboratory results with the patient.  He counts are well within normal limits.  I personally reviewed and went over radiographic studies with the patient.  He understands that some of his lung lesions are slightly worse while others are stable.  This represents a mixed response.    The patient has not taken his cholesterol or hypertension medication due to him running out of the medication.  He will stop by his PCP office to get refills of this medications.  Past Medical History  Diagnosis Date  . Hypertension   . Cancer   . Colon cancer 09/22/2010  . Heart disease     has HYPERLIPIDEMIA; TOBACCO ABUSE; HYPERTENSION; COPD; SPLENOMEGALY; ALCOHOL ABUSE, HX OF; DUODENAL ULCER, HX OF; SMALL BOWEL OBSTRUCTION, HX OF; ABSCESS, PERIRECTAL, HX OF; and Colon cancer on his problem list.      has no known allergies.  Martin Maldonado had no medications administered during this visit.  Past Surgical History  Procedure Date  . Irrigation and debridement sebaceous cyst   . Small intestine surgery   . Heart bypass     double  . Colostomy   . Portacath placement     2nd port    Denies any headaches, dizziness, double vision, fevers, chills, night sweats,  nausea, vomiting, diarrhea, constipation, chest pain, heart palpitations, shortness of breath, blood in stool, black tarry stool, urinary pain, urinary burning, urinary frequency, hematuria.   PHYSICAL EXAMINATION  ECOG PERFORMANCE STATUS: 0 - Asymptomatic  Filed Vitals:   01/19/11 0920  BP: 179/96  Pulse: 80  Temp: 97.5 F (36.4 C)    GENERAL:alert, no distress, well nourished, well developed, comfortable, cooperative and smiling SKIN: skin color, texture, turgor are normal HEAD: Normocephalic EYES: normal EARS: External ears normal OROPHARYNX:mucous membranes are moist  NECK: supple, trachea midline LYMPH:  no palpable lymphadenopathy BREAST:not examined LUNGS: clear to auscultation  HEART: regular rate & rhythm, no murmurs, no gallops, S1 normal and S2 normal ABDOMEN:abdomen soft, non-tender and normal bowel sounds BACK: Back symmetric, no curvature. EXTREMITIES:less then 2 second capillary refill, no joint deformities, effusion, or inflammation, no edema, no skin discoloration, no clubbing, no cyanosis  NEURO: alert & oriented x 3 with fluent speech, no focal motor/sensory deficits, gait normal   LABORATORY DATA: CBC    Component Value Date/Time   WBC 7.6 01/15/2011 1118   RBC 4.80 01/15/2011 1118   HGB 14.7 01/15/2011 1118   HCT 45.5 01/15/2011 1118   PLT 213 01/15/2011 1118   MCV 94.8 01/15/2011 1118   MCH 30.6 01/15/2011 1118   MCHC 32.3 01/15/2011 1118   RDW 14.5 01/15/2011 1118   LYMPHSABS 1.6 01/15/2011 1118   MONOABS 0.5 01/15/2011 1118   EOSABS 0.3 01/15/2011 1118   BASOSABS  0.1 01/15/2011 1118      Chemistry      Component Value Date/Time   NA 137 01/15/2011 1118   K 3.8 01/15/2011 1118   CL 102 01/15/2011 1118   CO2 23 01/15/2011 1118   BUN 17 01/15/2011 1118   CREATININE 1.31 01/15/2011 1118      Component Value Date/Time   CALCIUM 9.4 01/15/2011 1118   ALKPHOS 97 01/15/2011 1118   AST 18 01/15/2011 1118   ALT 19 01/15/2011 1118    BILITOT 0.2* 01/15/2011 1118     Lab Results  Component Value Date   CEA 4.3 01/15/2011    RADIOGRAPHIC STUDIES:  Ct Chest Wo Contrast  01/18/2011  *RADIOLOGY REPORT*  Clinical Data: Follow-up lung nodules.  Rectal carcinoma.  CT CHEST WITHOUT CONTRAST  Technique:  Multidetector CT imaging of the chest was performed following the standard protocol without IV contrast.  Comparison: 11/11/2010.  Findings: No pathologically enlarged mediastinal or axillary lymph nodes.  Hilar regions are difficult to definitively evaluate without IV contrast, but there is a spiculated nodule in the left infrahilar region (image 25).  Heart size normal.  No pericardial effusion.  Spiculated pulmonary nodules are seen bilaterally and have enlarged in the interval.  Index right lower lobe nodule measures 1.9 x 1.4 cm on image 37 (previously 1.5 x 1.1 cm).  No pleural fluid. Airway is unremarkable.  Incidental imaging of the upper abdomen shows a sub centimeter low attenuation lesion in the peripheral right hepatic lobe, stable. There may be a tiny stone in the gallbladder.  Adrenal glands are unremarkable.  No worrisome lytic or sclerotic lesions.  IMPRESSION:  1.  Interval progression of pulmonary metastatic disease. 2.  Cholelithiasis.  Original Report Authenticated By: Reyes Ivan, M.D.      ASSESSMENT:  1. 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 He has had 8 doses of a planned 12  but would like a break in therapy if possible.  2. History of cancer of the rectum status post resection on 05/09/2006  by Dr. Franky Macho at which time he was found to have a moderately  differentiated adenocarcinoma of the rectum 4.0 cm in size with LVI  1 of 12 positive nodes and he had invasion into perirectal adipose  tissue and tumor present at the inked perirectal soft tissue  margin. We therefore treated him as if he had extensive local  regional disease and  probably persistence of disease and I treated  him at that time with chemotherapy consisting of postoperative  radiation therapy with concomitant capecitabine and 6 cycles of  oxaliplatin and capecitabine and Avastin.  3. Coronary artery bypass graft on 06/09/2009.  4. He is still smoking occasionally.  5. Small bowel obstruction requiring surgery in February 2009.  6. Chronic back pain.  7. Port-A-Cath placement in the past.  8. History of ethanol abuse many years ago. Stopped drinking some  time ago.    PLAN:  1. Lab work in three months: CBC diff, CMET, CEA 2. Flu vaccine today 3. CT CAP with contrast in three months following lab work. 4. Return to the clinic in 3 months following CT scan results. 5. I personally reviewed and went over laboratory results with the patient. 6. I personally reviewed and went over radiographic studies with the patient.   All questions were answered. The patient knows to call the clinic with any problems, questions or concerns. We can  certainly see the patient much sooner if necessary.  The patient and plan discussed with Glenford Peers, MD and he is in agreement with the aforementioned.   KEFALAS,THOMAS

## 2011-02-01 ENCOUNTER — Ambulatory Visit (INDEPENDENT_AMBULATORY_CARE_PROVIDER_SITE_OTHER): Payer: Managed Care, Other (non HMO) | Admitting: Cardiology

## 2011-02-01 ENCOUNTER — Encounter: Payer: Self-pay | Admitting: *Deleted

## 2011-02-01 ENCOUNTER — Encounter: Payer: Self-pay | Admitting: Cardiology

## 2011-02-01 ENCOUNTER — Other Ambulatory Visit: Payer: Self-pay | Admitting: *Deleted

## 2011-02-01 DIAGNOSIS — E7800 Pure hypercholesterolemia, unspecified: Secondary | ICD-10-CM

## 2011-02-01 DIAGNOSIS — E785 Hyperlipidemia, unspecified: Secondary | ICD-10-CM

## 2011-02-01 DIAGNOSIS — I10 Essential (primary) hypertension: Secondary | ICD-10-CM

## 2011-02-01 DIAGNOSIS — I2510 Atherosclerotic heart disease of native coronary artery without angina pectoris: Secondary | ICD-10-CM

## 2011-02-01 DIAGNOSIS — F17200 Nicotine dependence, unspecified, uncomplicated: Secondary | ICD-10-CM

## 2011-02-01 MED ORDER — SIMVASTATIN 40 MG PO TABS: 40.0000 mg | ORAL_TABLET | Freq: Every day | ORAL | Status: DC

## 2011-02-01 MED ORDER — METOPROLOL TARTRATE 50 MG PO TABS: 50.0000 mg | ORAL_TABLET | Freq: Two times a day (BID) | ORAL | Status: DC

## 2011-02-01 NOTE — Patient Instructions (Signed)
Your physician recommends that you schedule a follow-up appointment in: 1 year with Dr Dietrich Pates and 1 month with nurse for Blood pressure check   Your physician has requested that you regularly monitor and record your blood pressure readings at home. Please use the same machine at the same time of day to check your readings and record them to bring to your follow-up visit.  Your physician recommends that you return for lab work in: 1 month (lipids)  You will receive a letter

## 2011-02-01 NOTE — Progress Notes (Signed)
HPI: [name] returns to the office somewhat beyond his anticipated follow-up for continued assessment and treatment of coronary disease and cardiovascular risk factors.  Since his last visit, he has done fine from a cardiac standpoint.  He is active including cutting wood with hand tools without any cardiopulmonary symptoms.  Unfortunately, has had recurrent adenocarcinoma metastatic to the lung requiring extensive chemotherapy.  He accepts this was in remarkably good humor and grace.  Prior to Admission medications   Medication Sig Start Date End Date Taking? Authorizing Provider  aspirin 81 MG tablet Take 81 mg by mouth daily.     Yes Historical Provider, MD  multivitamin [facility]) per tablet Take 1 tablet by mouth daily.     Yes Historical Provider, MD  lisinopril (PRINIVIL,ZESTRIL) 20 MG tablet take 1 tablet by mouth once daily 10/28/10   Gerrit Friends. Erice Ahles, MD  LORazepam (ATIVAN) 1 MG tablet Take 1 mg by mouth every 4 (four) hours.      Historical Provider, MD  metoprolol (LOPRESSOR) 50 MG tablet TAKE 1 TABLET BY MOUTH TWICE A DAY 10/28/10   Gerrit Friends. Mahalie Kanner, MD  prochlorperazine (COMPAZINE) 10 MG tablet Take 10 mg by mouth every 6 (six) hours as needed.      Historical Provider, MD  promethazine (PHENERGAN) 25 MG tablet Take 25 mg by mouth every 6 (six) hours as needed.      Historical Provider, MD  simvastatin (ZOCOR) 40 MG tablet take 1 tablet by mouth daily at bedtime 10/28/10   Gerrit Friends. Dietrich Pates, MD    No Known Allergies    Past medical history, social history, and family history reviewed and updated.  ROS: Denies chest discomfort, dyspnea, orthopnea, PND, lightheadedness, palpitations or syncope.  Fatigue, nausea and emesis are associated with his chemotherapy.  Has severe peripheral neuropathy related to previous antineoplastic medications.  PHYSICAL EXAM: BP 170/110  Pulse 84  Ht 6\' 1"  (1.854 m)  Wt 95.255 kg (210 lb)  BMI 27.71 kg/m2   General-Well developed; no acute  distress Body habitus-proportionate weight and height Neck-No JVD; no carotid bruits Lungs-clear lung fields; resonant to percussion; surgical scars below both clavicles where indwelling central access was placed.  There is a subcutaneous port on the right. Cardiovascular-normal PMI; normal S1 and S2; well-healed median sternotomy scar Abdomen-normal bowel sounds; soft and non-tender without masses or organomegaly; marked deformity of the lower abdominal wall in the regions of a previous surgical incision that was allowed to heal by secondary intent. Musculoskeletal-No deformities, no cyanosis or clubbing Neurologic-Normal cranial nerves; symmetric strength and tone Skin-Warm, no significant lesions Extremities-distal pulses intact; no edema  EKG: Normal sinus rhythm; within normal limits; no significant change compared with a previous tracing performed 06/06/09  ASSESSMENT AND PLAN: As the result of a delayed return visit, [name] has exhausted his supply of all medications.  This occurred approximately 5 weeks ago.  Blood pressure initially failed to increase, but subsequently rose approximately 3 weeks ago.   He is asked to monitor blood pressure at home and to return in one month for a blood pressure check and a lipid profile.  Patient was congratulated on minimizing his use of tobacco and urged not to relapse.  I will plan to see him again in one year.  Martin Di­az Bing, MD 02/01/2011 11:33 AM

## 2011-02-01 NOTE — Assessment & Plan Note (Addendum)
Hyperlipidemia is fairly severe.  I hope that when the available lipid profile was obtained the patient was not receiving pharmacologic therapy.  One month after resumption of his medication, a repeat profile will be obtained, but I suspect he will need more intensive therapy than he has received in the past.

## 2011-02-03 ENCOUNTER — Other Ambulatory Visit: Payer: Self-pay | Admitting: Cardiology

## 2011-02-04 ENCOUNTER — Other Ambulatory Visit: Payer: Self-pay | Admitting: *Deleted

## 2011-02-04 MED ORDER — LISINOPRIL 20 MG PO TABS: 20.0000 mg | ORAL_TABLET | Freq: Every day | ORAL | Status: DC

## 2011-02-26 ENCOUNTER — Other Ambulatory Visit: Payer: Self-pay | Admitting: Cardiology

## 2011-02-26 ENCOUNTER — Encounter (HOSPITAL_COMMUNITY): Payer: Managed Care, Other (non HMO) | Attending: Oncology

## 2011-02-26 DIAGNOSIS — C7800 Secondary malignant neoplasm of unspecified lung: Secondary | ICD-10-CM

## 2011-02-26 DIAGNOSIS — C19 Malignant neoplasm of rectosigmoid junction: Secondary | ICD-10-CM | POA: Insufficient documentation

## 2011-02-26 DIAGNOSIS — Z452 Encounter for adjustment and management of vascular access device: Secondary | ICD-10-CM

## 2011-02-26 MED ORDER — SODIUM CHLORIDE 0.9 % IJ SOLN
10.0000 mL | INTRAMUSCULAR | Status: DC | PRN
  Administered 2011-02-26: 10 mL via INTRAVENOUS
  Filled 2011-02-26: qty 10

## 2011-02-26 MED ORDER — HEPARIN SOD (PORK) LOCK FLUSH 100 UNIT/ML IV SOLN
500.0000 [IU] | Freq: Once | INTRAVENOUS | Status: AC
  Administered 2011-02-26: 500 [IU] via INTRAVENOUS
  Filled 2011-02-26: qty 5

## 2011-02-26 MED ORDER — HEPARIN SOD (PORK) LOCK FLUSH 100 UNIT/ML IV SOLN
INTRAVENOUS | Status: AC
  Administered 2011-02-26: 500 [IU] via INTRAVENOUS
  Filled 2011-02-26: qty 5

## 2011-02-26 MED ORDER — SODIUM CHLORIDE 0.9 % IJ SOLN
INTRAMUSCULAR | Status: AC
  Filled 2011-02-26: qty 10

## 2011-02-26 MED ORDER — SODIUM CHLORIDE 0.9 % IJ SOLN
INTRAMUSCULAR | Status: AC
  Administered 2011-02-26: 10 mL via INTRAVENOUS
  Filled 2011-02-26: qty 10

## 2011-02-26 NOTE — Progress Notes (Signed)
Martin Maldonado presented for Portacath access and flush. Proper placement of portacath confirmed by CXR. Portacath located right chest wall accessed with  H 20 needle. No blood return and flushed easily with 20cc NS.  No swelling or pain noted. Portacath flushed with 20ml NS and 500U/48ml Heparin and needle removed intact. Procedure without incident. Patient tolerated procedure well.

## 2011-02-27 LAB — LIPID PANEL
HDL: 27 mg/dL — ABNORMAL LOW (ref >39)
LDL Cholesterol: 145 mg/dL — ABNORMAL HIGH (ref 0–99)
Total CHOL/HDL Ratio: 9 Ratio
Triglycerides: 349 mg/dL — ABNORMAL HIGH (ref <150)

## 2011-03-01 ENCOUNTER — Ambulatory Visit (INDEPENDENT_AMBULATORY_CARE_PROVIDER_SITE_OTHER): Payer: Managed Care, Other (non HMO)

## 2011-03-01 ENCOUNTER — Encounter: Payer: Self-pay | Admitting: Cardiology

## 2011-03-01 VITALS — BP 148/85 | HR 59 | Wt 214.1 lb

## 2011-03-01 DIAGNOSIS — I10 Essential (primary) hypertension: Secondary | ICD-10-CM

## 2011-03-02 ENCOUNTER — Encounter: Payer: Self-pay | Admitting: *Deleted

## 2011-03-02 ENCOUNTER — Other Ambulatory Visit: Payer: Self-pay | Admitting: *Deleted

## 2011-03-02 ENCOUNTER — Telehealth: Payer: Self-pay | Admitting: *Deleted

## 2011-03-02 DIAGNOSIS — E782 Mixed hyperlipidemia: Secondary | ICD-10-CM

## 2011-03-02 MED ORDER — ATORVASTATIN CALCIUM 80 MG PO TABS: 80.0000 mg | ORAL_TABLET | Freq: Every day | ORAL | Status: DC

## 2011-03-02 NOTE — Telephone Encounter (Signed)
Message left for patient to call back regarding lab results and instructions.

## 2011-04-02 ENCOUNTER — Other Ambulatory Visit: Payer: Self-pay | Admitting: Cardiology

## 2011-04-03 LAB — LIPID PANEL
Cholesterol: 188 mg/dL (ref 0–200)
VLDL: 46 mg/dL — ABNORMAL HIGH (ref 0–40)

## 2011-04-12 ENCOUNTER — Encounter (HOSPITAL_COMMUNITY): Payer: Managed Care, Other (non HMO) | Attending: Oncology

## 2011-04-12 DIAGNOSIS — C7800 Secondary malignant neoplasm of unspecified lung: Secondary | ICD-10-CM

## 2011-04-12 DIAGNOSIS — C20 Malignant neoplasm of rectum: Secondary | ICD-10-CM | POA: Insufficient documentation

## 2011-04-12 DIAGNOSIS — C189 Malignant neoplasm of colon, unspecified: Secondary | ICD-10-CM

## 2011-04-12 LAB — COMPREHENSIVE METABOLIC PANEL
ALT: 26 U/L (ref 0–53)
Alkaline Phosphatase: 100 U/L (ref 39–117)
BUN: 16 mg/dL (ref 6–23)
CO2: 26 mEq/L (ref 19–32)
Chloride: 105 mEq/L (ref 96–112)
GFR calc Af Amer: 81 mL/min — ABNORMAL LOW (ref >90)
GFR calc non Af Amer: 70 mL/min — ABNORMAL LOW (ref >90)
Glucose, Bld: 87 mg/dL (ref 70–99)
Potassium: 4.2 mEq/L (ref 3.5–5.1)
Sodium: 139 mEq/L (ref 135–145)
Total Bilirubin: 0.4 mg/dL (ref 0.3–1.2)
Total Protein: 7.6 g/dL (ref 6.0–8.3)

## 2011-04-12 LAB — DIFFERENTIAL
Eosinophils Absolute: 0.3 10*3/uL (ref 0.0–0.7)
Lymphocytes Relative: 18 % (ref 12–46)
Lymphs Abs: 1.7 10*3/uL (ref 0.7–4.0)
Monocytes Relative: 8 % (ref 3–12)
Neutrophils Relative %: 70 % (ref 43–77)

## 2011-04-12 LAB — CBC
Hemoglobin: 15.2 g/dL (ref 13.0–17.0)
MCH: 29.7 pg (ref 26.0–34.0)
Platelets: 200 10*3/uL (ref 150–400)
RBC: 5.11 MIL/uL (ref 4.22–5.81)
WBC: 9.4 10*3/uL (ref 4.0–10.5)

## 2011-04-12 LAB — CEA: CEA: 5.8 ng/mL — ABNORMAL HIGH (ref 0.0–5.0)

## 2011-04-12 MED ORDER — SODIUM CHLORIDE 0.9 % IJ SOLN
INTRAMUSCULAR | Status: AC
  Administered 2011-04-12: 10 mL via INTRAVENOUS
  Filled 2011-04-12: qty 10

## 2011-04-12 MED ORDER — HEPARIN SOD (PORK) LOCK FLUSH 100 UNIT/ML IV SOLN
INTRAVENOUS | Status: AC
  Administered 2011-04-12: 500 [IU] via INTRAVENOUS
  Filled 2011-04-12: qty 5

## 2011-04-12 MED ORDER — HEPARIN SOD (PORK) LOCK FLUSH 100 UNIT/ML IV SOLN
500.0000 [IU] | Freq: Once | INTRAVENOUS | Status: AC
  Administered 2011-04-12: 500 [IU] via INTRAVENOUS
  Filled 2011-04-12: qty 5

## 2011-04-12 MED ORDER — SODIUM CHLORIDE 0.9 % IN NEBU
INHALATION_SOLUTION | RESPIRATORY_TRACT | Status: AC
  Filled 2011-04-12: qty 3

## 2011-04-12 MED ORDER — SODIUM CHLORIDE 0.9 % IJ SOLN
10.0000 mL | INTRAMUSCULAR | Status: DC | PRN
  Administered 2011-04-12: 10 mL via INTRAVENOUS
  Filled 2011-04-12: qty 10

## 2011-04-12 NOTE — Progress Notes (Signed)
Tolerated port fllush with labs well

## 2011-04-19 ENCOUNTER — Other Ambulatory Visit (HOSPITAL_COMMUNITY): Payer: Medicaid Other

## 2011-04-20 ENCOUNTER — Ambulatory Visit (HOSPITAL_COMMUNITY)
Admission: RE | Admit: 2011-04-20 | Discharge: 2011-04-20 | Disposition: A | Discharge status: Home / Self Care | Payer: Managed Care, Other (non HMO) | Source: Ambulatory Visit | Attending: Oncology | Admitting: Oncology

## 2011-04-20 DIAGNOSIS — Z9049 Acquired absence of other specified parts of digestive tract: Secondary | ICD-10-CM | POA: Insufficient documentation

## 2011-04-20 DIAGNOSIS — C7800 Secondary malignant neoplasm of unspecified lung: Secondary | ICD-10-CM | POA: Insufficient documentation

## 2011-04-20 DIAGNOSIS — Z933 Colostomy status: Secondary | ICD-10-CM | POA: Insufficient documentation

## 2011-04-20 DIAGNOSIS — C189 Malignant neoplasm of colon, unspecified: Secondary | ICD-10-CM

## 2011-04-20 DIAGNOSIS — Z85048 Personal history of other malignant neoplasm of rectum, rectosigmoid junction, and anus: Secondary | ICD-10-CM | POA: Insufficient documentation

## 2011-04-20 MED ORDER — IOHEXOL 300 MG/ML  SOLN
100.0000 mL | Freq: Once | INTRAMUSCULAR | Status: AC | PRN
  Administered 2011-04-20: 100 mL via INTRAVENOUS

## 2011-04-21 ENCOUNTER — Encounter (HOSPITAL_BASED_OUTPATIENT_CLINIC_OR_DEPARTMENT_OTHER): Payer: Managed Care, Other (non HMO) | Admitting: Oncology

## 2011-04-21 ENCOUNTER — Other Ambulatory Visit (HOSPITAL_COMMUNITY): Payer: Self-pay | Admitting: Oncology

## 2011-04-21 VITALS — BP 158/82 | HR 72 | Temp 97.8°F | Wt 213.2 lb

## 2011-04-21 DIAGNOSIS — C19 Malignant neoplasm of rectosigmoid junction: Secondary | ICD-10-CM

## 2011-04-21 DIAGNOSIS — C20 Malignant neoplasm of rectum: Secondary | ICD-10-CM

## 2011-04-21 DIAGNOSIS — C7800 Secondary malignant neoplasm of unspecified lung: Secondary | ICD-10-CM

## 2011-04-21 NOTE — Patient Instructions (Signed)
Martin Maldonado  [id number] 01/18/1956   [facility]  Discharge Instructions  RECOMMENDATIONS MADE BY THE CONSULTANT AND ANY TEST RESULTS WILL BE SENT TO YOUR REFERRING DOCTOR.   EXAM FINDINGS BY MD TODAY AND SIGNS AND SYMPTOMS TO REPORT TO CLINIC OR PRIMARY MD: We will restart chemotherapy using Avastin weekly, 5Fu and leucovorin every 14 days. Each cycle will run for 28 days.  Call Tobie Lords, RN with the names and number of anti-nausea medications that you have at home [phone])  MEDICATIONS PRESCRIBED: none  :  SPECIAL INSTRUCTIONS/FOLLOW-UP: Return to Clinic on Monday for chemotherapy, 1 month to see PA and 2 months to see Dr. Mariel Sleet.   I acknowledge that I have been informed and understand all the instructions given to me and received a copy. I do not have any more questions at this time, but understand that I may call the Specialty Clinic at [facility] at [phone] during business hours should I have any further questions or need assistance in obtaining follow-up care.    __________________________________________  _____________  __________ Signature of Patient or Authorized Representative            Date                   Time    __________________________________________ Nurse's Signature

## 2011-04-21 NOTE — Progress Notes (Signed)
CC:   Kirstie Peri, MD R. Roetta Sessions, MD FACP Lajoyce Lauber. Dietrich Pates, MD, [facility] Dalia Heading, M.D.  DIAGNOSIS: 1. Metastatic rectal cancer to lungs with now progression of disease     on a CT scan done the other day. 2. CABG on 06/09/2009. 3. chronic obstructive pulmonary disease from smoking x many years,     though he has quit. 4. Small bowel obstruction requiring surgery in February 2009. 5. Rectal cancer surgery in February 2008 with 1 of 12 positive nodes     with LVI and a 4 cm primary, status post 6 cycles of adjuvant     oxaliplatin, capecitabine and Avastin since I was concerned he had     postoperative disease left behind in the pelvis, and he also     received postoperative radiation therapy with concomitant     capecitabine at that time. 6. Chronic back pain. 7. Port-A-Cath placed in the past. Baruch is doing well.  He states he is not symptomatic from a pulmonary standpoint, but his CT scan was very impressive the other day with about doubling of the nodules, and the most significant one was the hilar mass on the left.  His CEA has not been very helpful; it is 5.8, but it has not been perfectly corresponding to progression or regression of disease.  He still has no disease elsewhere, for example his liver and abdomen are still clean.  So what I have recommended to Josephus is that he take chemotherapy with 5- FU, leucovorin and cetuximab.  His quality of life has been very, very good, and I want to try to keep it that way.  I do not want to give him oxaliplatin because he has peripheral neuropathy which has just disappeared from his hands, but his feet are still very, very numb.  I do not want to give him CPT-11 with cetuximab at this juncture.  So we are going to try to pursue the above-mentioned 5-FU, leucovorin and cetuximab regimen.  The 5-FU and leucovorin will be every 14 days, the cetuximab will be weekly, and we will reassess him after 12 weeks  of therapy.    ______________________________ Ladona Horns. Mariel Sleet, MD ESN/MEDQ  D:  04/21/2011  T:  04/21/2011  Job:  [id number]

## 2011-04-21 NOTE — Progress Notes (Signed)
This office note has been dictated.

## 2011-04-22 ENCOUNTER — Telehealth (HOSPITAL_COMMUNITY): Payer: Self-pay | Admitting: Oncology

## 2011-04-26 MED ORDER — PROCHLORPERAZINE MALEATE 10 MG PO TABS: 10.0000 mg | ORAL_TABLET | Freq: Four times a day (QID) | ORAL | Status: DC | PRN

## 2011-04-26 MED ORDER — LORAZEPAM 1 MG PO TABS: 1.0000 mg | ORAL_TABLET | ORAL | Status: AC | PRN

## 2011-04-26 MED ORDER — DEXAMETHASONE 4 MG PO TABS: ORAL_TABLET | ORAL | Status: DC

## 2011-04-26 MED ORDER — ONDANSETRON HCL 8 MG PO TABS: ORAL_TABLET | ORAL | Status: DC

## 2011-04-26 NOTE — Patient Instructions (Addendum)
[facility]   CHEMOTHERAPY INSTRUCTIONS 5FU, Leucovorin, Erbitux  POTENTIAL SIDE EFFECTS OF TREATMENT: Increased Susceptibility to Infection, Vomiting, Constipation, Hair Thinning, Changes in Character of Skin and Nails (brittleness, dryness,etc.), Bone Marrow Suppression, Abdominal Cramping and Urinary Frequency  5FU - Fluorouracil - can cause bone marrow suppression (lowers white blood cells (fight infection), lowers red blood cells (make up your blood), lowers platelets (help blood to clot). Nausea/vomiting, diarrhea, mouth sores, hair loss, dry skin, ocular toxicities (increased tear production, sensitivity to light). You need to wear sunscreen and sunglasses. You are much more susceptible to the UV rays and will burn much faster and easier.   Leucovorin - this is a medication that is not chemo but given with chemo. This med "rescues" the healthy cells before we administer the drug 5FU. This makes the 5FU work better.   Erbitux - this is a monoclonal antibody. This medication is derived partially from a mouse protein. Side Effects: infusion related reactions may include bronchospasm, fever, chills, shaking chills, itching, swelling around heart/blood vessels, low blood pressure, lung toxicity, acneform rash. It can also cause dry skin, fatigue, muscle aches, diarrhea, vomiting, no appetite, low white blood cell count, low magnesium, weight loss.   (STAY OUT OF SUN! WEAR PROTECTIVE CLOTHING WHEN OUT IN SUN EVEN IF IT ISN'T HOT! WEAR SUNSCREEN OVER ALL PARTS OF YOUR EXPOSED BODY! YOU NEED TO USE ALCOHOL FREE LOTIONS ON YOUR SKIN DAILY. CETAPHIL AND LUBRADERM ARE GOOD LOTIONS THAT YOU COULD USE)!!!!!  EDUCATIONAL MATERIALS GIVEN AND REVIEWED: Specific Instructions Sheets regarding Erbitux   SELF CARE ACTIVITIES WHILE ON CHEMOTHERAPY: Increase your fluid intake 48 hours prior to treatment and drink at least 2 quarts per day after treatment., No alcohol intake.,  No aspirin or other medications unless approved by your oncologist., Eat foods that are light and easy to digest., Eat foods at cold or room temperature., No fried, fatty, or spicy foods immediately before or after treatment., Have teeth cleaned professionally before starting treatment. Keep dentures and partial plates clean., Use soft toothbrush and do not use mouthwashes that contain alcohol. Biotene is a good mouthwash that is available at most pharmacies or may be ordered by calling [phone]., Use warm salt water gargles (1 teaspoon salt per 1 quart warm water) before and after meals and at bedtime. Or you may rinse with 2 tablespoons of three -percent hydrogen peroxide mixed in eight ounces of water., Always use sunscreen with SPF (Sun Protection Factor) of 30 or higher., Use your nausea medication as directed to prevent nausea., Use your stool softener or laxative as directed to prevent constipation. and Use your anti-diarrheal medication as directed to stop diarrhea.  Please wash your hands for at least 30 seconds using warm soapy water. Handwashing is the #1 way to prevent the spread of germs. Stay away from sick people or people who are getting over a cold. If you develop respiratory systems such as green/yellow mucus production or productive cough or persistent cough let us know and we will see if you need an antibiotic. It is a good idea to keep a pair of gloves on when going into grocery stores/[employer] to decrease your risk of coming into contact with germs on the carts, etc. Carry alcohol hand gel with you at all times and use it frequently if out in public. All foods need to be cooked thoroughly. No raw foods. No medium or undercooked meats, eggs. If your food is cooked medium well, it  does not need to be hot pink or saturated with bloody liquid at all. Vegetables and fruits need to be washed/rinsed under the faucet with a dish detergent before being consumed. You can eat raw fruits and  vegetables unless we tell you otherwise but it would be best if you cooked them or bought frozen. Do not eat off of salad bars or hot bars unless you really trust the cleanliness of the restaurant. If you need dental work, please let Dr. Mariel Sleet know before you go for your appointment so that we can coordinate the best possible time for you in regards to your chemo regimen. You need to also let your dentist know that you are actively taking chemo. We may need to do labs prior to your dental appointment. We also want your bowels moving at least every other day. If this is not happening, we need to know so that we can get you on a bowel regimen to help you go.    MEDICATIONS: You have been given prescriptions for the following medications:  Dexamethasone 4 mg tablet. Starting the day after chemo (after the removal of the 65fu pump) take 2 tablets in the am for 2 days.   Zofran 8 mg tablet. Starting the day after chemo (after the removal of the 78fu pump) take 1 tablet in the am and 1 tablet in the pm for 2 days. If you feel like you don't need this - it is ok if you only take it if needed up to 2 times a day.  Ativan 1 mg tablet. Take 1 tablet every 4 hours if needed for nausea/vomiting.   Compazine 10mg  tablet. May continue to take this tablet every 6 hours if needed for nausea/vomiting.     SYMPTOMS TO REPORT AS SOON AS POSSIBLE AFTER TREATMENT:  FEVER GREATER THAN 100.5 F  CHILLS WITH OR WITHOUT FEVER  NAUSEA AND VOMITING THAT IS NOT CONTROLLED WITH YOUR NAUSEA MEDICATION  UNUSUAL SHORTNESS OF BREATH  UNUSUAL BRUISING OR BLEEDING  TENDERNESS IN MOUTH AND THROAT WITH OR WITHOUT PRESENCE OF ULCERS  URINARY PROBLEMS  BOWEL PROBLEMS  UNUSUAL RASH    Wear comfortable clothing and clothing appropriate for easy access to any Portacath or PICC line. Let us know if there is anything that we can do to make your therapy better!      I have been informed and understand all of the  instructions given to me and have received a copy. I have been instructed to call the clinic [phone] or my family physician as soon as possible for continued medical care, if indicated. I do not have any more questions at this time but understand that I may call the Cancer Center or the Patient Navigator at [phone] during office hours should I have questions or need assistance in obtaining follow-up care.      _________________________________________      _______________     __________ Signature of Patient or Authorized Representative        Date                            Time      _________________________________________ Nurse's Signature

## 2011-04-27 ENCOUNTER — Other Ambulatory Visit (HOSPITAL_COMMUNITY): Payer: Self-pay

## 2011-04-27 ENCOUNTER — Ambulatory Visit (HOSPITAL_COMMUNITY): Payer: Managed Care, Other (non HMO)

## 2011-04-27 ENCOUNTER — Telehealth (HOSPITAL_COMMUNITY): Payer: Self-pay

## 2011-04-27 NOTE — Telephone Encounter (Signed)
Patient cancelled chemotherapy this week, states "I've got the flu and just don't feel well enough to come in today".  Symptoms began over the weekend.  Encouraged to drink plenty of fluids.  Chemotherpy rescheduled for 05/04/11.

## 2011-04-29 ENCOUNTER — Encounter (HOSPITAL_COMMUNITY): Payer: Managed Care, Other (non HMO)

## 2011-05-04 ENCOUNTER — Inpatient Hospital Stay (HOSPITAL_COMMUNITY): Payer: Managed Care, Other (non HMO)

## 2011-05-04 ENCOUNTER — Other Ambulatory Visit (HOSPITAL_COMMUNITY): Payer: Self-pay | Admitting: Oncology

## 2011-05-04 ENCOUNTER — Encounter (HOSPITAL_COMMUNITY): Payer: Managed Care, Other (non HMO) | Attending: Oncology

## 2011-05-04 DIAGNOSIS — C19 Malignant neoplasm of rectosigmoid junction: Secondary | ICD-10-CM

## 2011-05-04 DIAGNOSIS — R21 Rash and other nonspecific skin eruption: Secondary | ICD-10-CM | POA: Insufficient documentation

## 2011-05-04 DIAGNOSIS — Z5111 Encounter for antineoplastic chemotherapy: Secondary | ICD-10-CM

## 2011-05-04 DIAGNOSIS — C20 Malignant neoplasm of rectum: Secondary | ICD-10-CM

## 2011-05-04 DIAGNOSIS — C7800 Secondary malignant neoplasm of unspecified lung: Secondary | ICD-10-CM

## 2011-05-04 LAB — DIFFERENTIAL
Lymphs Abs: 1.7 10*3/uL (ref 0.7–4.0)
Monocytes Absolute: 0.6 10*3/uL (ref 0.1–1.0)
Monocytes Relative: 7 % (ref 3–12)
Neutro Abs: 5.4 10*3/uL (ref 1.7–7.7)
Neutrophils Relative %: 67 % (ref 43–77)

## 2011-05-04 LAB — CBC
HCT: 41.3 % (ref 39.0–52.0)
Hemoglobin: 13.9 g/dL (ref 13.0–17.0)
RBC: 4.68 MIL/uL (ref 4.22–5.81)

## 2011-05-04 LAB — COMPREHENSIVE METABOLIC PANEL
Alkaline Phosphatase: 90 U/L (ref 39–117)
BUN: 21 mg/dL (ref 6–23)
CO2: 21 mEq/L (ref 19–32)
Chloride: 110 mEq/L (ref 96–112)
GFR calc Af Amer: 77 mL/min — ABNORMAL LOW (ref >90)
GFR calc non Af Amer: 67 mL/min — ABNORMAL LOW (ref >90)
Glucose, Bld: 113 mg/dL — ABNORMAL HIGH (ref 70–99)
Potassium: 4.1 mEq/L (ref 3.5–5.1)
Total Bilirubin: 0.3 mg/dL (ref 0.3–1.2)

## 2011-05-04 MED ORDER — LEUCOVORIN CALCIUM INJECTION 350 MG: 400.0000 mg/m2 | Freq: Once | INTRAVENOUS | Status: DC

## 2011-05-04 MED ORDER — FLUOROURACIL CHEMO INJECTION 2.5 GM/50ML
400.0000 mg/m2 | Freq: Once | INTRAVENOUS | Status: AC
  Administered 2011-05-04: 900 mg via INTRAVENOUS
  Filled 2011-05-04: qty 18

## 2011-05-04 MED ORDER — DEXAMETHASONE SODIUM PHOSPHATE 10 MG/ML IJ SOLN: 10.0000 mg | Freq: Once | INTRAMUSCULAR | Status: DC

## 2011-05-04 MED ORDER — SODIUM CHLORIDE 0.9 % IV SOLN
Freq: Once | INTRAVENOUS | Status: AC
  Administered 2011-05-04: 8 mg via INTRAVENOUS
  Filled 2011-05-04: qty 4

## 2011-05-04 MED ORDER — ONDANSETRON HCL 40 MG/20ML IJ SOLN: 8.0000 mg | Freq: Once | INTRAMUSCULAR | Status: DC

## 2011-05-04 MED ORDER — DIPHENHYDRAMINE HCL 50 MG/ML IJ SOLN
INTRAMUSCULAR | Status: AC
  Administered 2011-05-04: 50 mg via INTRAVENOUS
  Filled 2011-05-04: qty 1

## 2011-05-04 MED ORDER — FLUOROURACIL CHEMO INJECTION 5 GM/100ML
2400.0000 mg/m2 | INTRAVENOUS | Status: DC
  Administered 2011-05-04: 5350 mg via INTRAVENOUS
  Filled 2011-05-04 (×2): qty 107

## 2011-05-04 MED ORDER — LEUCOVORIN CALCIUM INJECTION 100 MG
46.0000 mg | Freq: Once | INTRAMUSCULAR | Status: AC
  Administered 2011-05-04: 46 mg via INTRAVENOUS
  Filled 2011-05-04: qty 2.3

## 2011-05-04 MED ORDER — DIPHENHYDRAMINE HCL 50 MG/ML IJ SOLN
50.0000 mg | Freq: Once | INTRAMUSCULAR | Status: AC
  Administered 2011-05-04: 50 mg via INTRAVENOUS

## 2011-05-04 MED ORDER — HEPARIN SOD (PORK) LOCK FLUSH 100 UNIT/ML IV SOLN
500.0000 [IU] | Freq: Once | INTRAVENOUS | Status: DC | PRN
  Filled 2011-05-04: qty 5

## 2011-05-04 MED ORDER — CETUXIMAB CHEMO IV INJECTION 200 MG/100ML
400.0000 mg/m2 | Freq: Once | INTRAVENOUS | Status: AC
  Administered 2011-05-04: 900 mg via INTRAVENOUS
  Filled 2011-05-04: qty 450

## 2011-05-04 MED ORDER — SODIUM CHLORIDE 0.9 % IJ SOLN
10.0000 mL | INTRAMUSCULAR | Status: DC | PRN
  Filled 2011-05-04: qty 10

## 2011-05-04 MED ORDER — SODIUM CHLORIDE 0.9 % IV SOLN
Freq: Once | INTRAVENOUS | Status: AC
  Administered 2011-05-04: 11:00:00 via INTRAVENOUS

## 2011-05-04 MED ORDER — SODIUM CHLORIDE 0.9 % IJ SOLN
INTRAMUSCULAR | Status: AC
  Filled 2011-05-04: qty 10

## 2011-05-04 NOTE — Progress Notes (Signed)
Tolerated well

## 2011-05-05 ENCOUNTER — Telehealth (HOSPITAL_COMMUNITY): Payer: Self-pay

## 2011-05-05 NOTE — Telephone Encounter (Signed)
Doing well. No c/o discomfort.

## 2011-05-06 ENCOUNTER — Encounter (HOSPITAL_COMMUNITY): Payer: Managed Care, Other (non HMO)

## 2011-05-06 ENCOUNTER — Encounter (HOSPITAL_BASED_OUTPATIENT_CLINIC_OR_DEPARTMENT_OTHER): Payer: Managed Care, Other (non HMO)

## 2011-05-06 VITALS — BP 156/90 | HR 68 | Temp 95.2°F

## 2011-05-06 DIAGNOSIS — C20 Malignant neoplasm of rectum: Secondary | ICD-10-CM

## 2011-05-06 DIAGNOSIS — C19 Malignant neoplasm of rectosigmoid junction: Secondary | ICD-10-CM

## 2011-05-06 DIAGNOSIS — Z452 Encounter for adjustment and management of vascular access device: Secondary | ICD-10-CM

## 2011-05-06 DIAGNOSIS — C7800 Secondary malignant neoplasm of unspecified lung: Secondary | ICD-10-CM

## 2011-05-06 MED ORDER — SODIUM CHLORIDE 0.9 % IJ SOLN
10.0000 mL | INTRAMUSCULAR | Status: DC | PRN
  Filled 2011-05-06: qty 10

## 2011-05-06 MED ORDER — SODIUM CHLORIDE 0.9 % IJ SOLN
INTRAMUSCULAR | Status: AC
  Administered 2011-05-06: 10 mL
  Filled 2011-05-06: qty 10

## 2011-05-06 MED ORDER — HEPARIN SOD (PORK) LOCK FLUSH 100 UNIT/ML IV SOLN
500.0000 [IU] | Freq: Once | INTRAVENOUS | Status: AC
  Administered 2011-05-06: 500 [IU] via INTRAVENOUS
  Filled 2011-05-06: qty 5

## 2011-05-06 MED ORDER — HEPARIN SOD (PORK) LOCK FLUSH 100 UNIT/ML IV SOLN
INTRAVENOUS | Status: AC
  Filled 2011-05-06: qty 5

## 2011-05-06 NOTE — Progress Notes (Signed)
Martin Maldonado presented for Portacath access and flush. Proper placement of portacath confirmed by CXR. Portacath located right chest wall accessed on arrival d/t continuous infusion of 5-FU which was completed prior to arrival.   Portacath flushed with 20ml NS and 500U/68ml Heparin and needle removed intact. Procedure without incident. Patient tolerated procedure well.

## 2011-05-07 ENCOUNTER — Other Ambulatory Visit (HOSPITAL_COMMUNITY): Payer: Self-pay | Admitting: Oncology

## 2011-05-11 ENCOUNTER — Inpatient Hospital Stay (HOSPITAL_COMMUNITY): Payer: Managed Care, Other (non HMO)

## 2011-05-11 ENCOUNTER — Encounter (HOSPITAL_COMMUNITY): Payer: Managed Care, Other (non HMO)

## 2011-05-11 DIAGNOSIS — C20 Malignant neoplasm of rectum: Secondary | ICD-10-CM

## 2011-05-11 NOTE — Progress Notes (Signed)
Martin Maldonado reports non-itching red rash across his torso and no other symptoms reported.  Dr. Mariel Sleet informed of same and eval'd patient; Eribtux deferred for this week per [place].  No medications given or prescribed at this time - patient advised to call if symptoms worsen.  Patient discharged without incident.

## 2011-05-13 ENCOUNTER — Encounter (HOSPITAL_COMMUNITY): Payer: Managed Care, Other (non HMO)

## 2011-05-17 ENCOUNTER — Encounter (HOSPITAL_BASED_OUTPATIENT_CLINIC_OR_DEPARTMENT_OTHER): Payer: Managed Care, Other (non HMO)

## 2011-05-17 ENCOUNTER — Encounter (HOSPITAL_BASED_OUTPATIENT_CLINIC_OR_DEPARTMENT_OTHER): Payer: Managed Care, Other (non HMO) | Admitting: Oncology

## 2011-05-17 VITALS — BP 153/84 | HR 64 | Temp 97.6°F | Wt 218.2 lb

## 2011-05-17 DIAGNOSIS — R21 Rash and other nonspecific skin eruption: Secondary | ICD-10-CM

## 2011-05-17 DIAGNOSIS — C20 Malignant neoplasm of rectum: Secondary | ICD-10-CM

## 2011-05-17 DIAGNOSIS — C19 Malignant neoplasm of rectosigmoid junction: Secondary | ICD-10-CM

## 2011-05-17 DIAGNOSIS — J449 Chronic obstructive pulmonary disease, unspecified: Secondary | ICD-10-CM

## 2011-05-17 DIAGNOSIS — J4489 Other specified chronic obstructive pulmonary disease: Secondary | ICD-10-CM

## 2011-05-17 LAB — DIFFERENTIAL
Eosinophils Relative: 4 % (ref 0–5)
Lymphocytes Relative: 16 % (ref 12–46)
Lymphs Abs: 1.5 10*3/uL (ref 0.7–4.0)
Monocytes Absolute: 0.7 10*3/uL (ref 0.1–1.0)
Monocytes Relative: 7 % (ref 3–12)

## 2011-05-17 LAB — CBC
MCH: 29.8 pg (ref 26.0–34.0)
MCHC: 33 g/dL (ref 30.0–36.0)
Platelets: 188 10*3/uL (ref 150–400)
RBC: 4.93 MIL/uL (ref 4.22–5.81)

## 2011-05-17 LAB — COMPREHENSIVE METABOLIC PANEL
ALT: 64 U/L — ABNORMAL HIGH (ref 0–53)
AST: 24 U/L (ref 0–37)
Calcium: 10.1 mg/dL (ref 8.4–10.5)
Sodium: 139 mEq/L (ref 135–145)
Total Protein: 7.6 g/dL (ref 6.0–8.3)

## 2011-05-17 MED ORDER — DOXYCYCLINE HYCLATE 100 MG PO TABS: ORAL_TABLET | ORAL | Status: DC

## 2011-05-17 MED ORDER — CLINDAMYCIN PHOSPHATE 1 % EX GEL: Freq: Two times a day (BID) | CUTANEOUS | Status: DC

## 2011-05-17 NOTE — Progress Notes (Signed)
Kirstie Peri, MD, MD [address]  [place] [address]  1. Rash  doxycycline (VIBRA-TABS) 100 MG tablet, clindamycin (CLINDAGEL) 1 % gel  2. Rectal cancer      CURRENT THERAPY: S/P cycle 1 of 5-FU and Leucovorin and S/P Day 1 of cycle 1 of weekly Cetuximab due to rash toxicity.  INTERVAL HISTORY: TRAYVEN LUMADUE 56 y.o. 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  The patient has a diffuse papulopustular acneiform eruption of the face, chest, and back.  He reports that it is pruritic at times and showering is painful. He reports it feels as though he is being showered with "shards of glass."  He explains that he is taking Benadryl at home and that helps with the pruritic nature of this skin eruption.    We spent some time discussing the etiology of this rash which is likely secondary to the cetuximab that he is receiving. We will hold chemotherapy today and postpone it by one week due to this dermatologic toxicity.  Otherwise, the patient denies any complaints.  ROS: No TIA's or unusual headaches, no dysphagia.  No prolonged cough. No dyspnea or chest pain on exertion.  No abdominal pain, change in bowel habits, black or bloody stools.  No urinary tract symptoms.  No new or unusual musculoskeletal symptoms.     Past Medical History  Diagnosis Date  . Hypertension   . Rectal cancer 2008    Adenocarcinoma; AP resection in 2008; recurrence with lung metastases in 2011  . Arteriosclerotic cardiovascular disease (ASCVD) 2011    CABG surgery in 2011  . Hyperlipidemia   . 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 Splenomegaly 01/23/2009    Additional GI history includes small bowel obstruction, requiring laparotomy and lysis of adhesions in 2009, remote peptic ulcer disease and postoperative perirectal infection  . ALCOHOL ABUSE, HX OF 01/23/2009    has HYPERLIPIDEMIA; Tobacco abuse, in remission; HYPERTENSION;  COPD; SPLENOMEGALY; SMALL BOWEL OBSTRUCTION, HX OF; and Rectal cancer on his problem list.      has no known allergies.  Mr. Ladnier does not currently have medications on file.  Past Surgical History  Procedure Date  . Irrigation and debridement sebaceous cyst   . Laparoscopic lysis intestinal adhesions 2009    With incidental appendectomy  . Coronary artery bypass graft 2011    2 vessel  . Abdominoperineal proctocolectomy 2008  . Portacath placement 2011    Denies any headaches, dizziness, double vision, fevers, chills, night sweats, nausea, vomiting, diarrhea, constipation, chest pain, heart palpitations, shortness of breath, blood in stool, black tarry stool, urinary pain, urinary burning, urinary frequency, hematuria.   PHYSICAL EXAMINATION  ECOG PERFORMANCE STATUS: 1 - Symptomatic but completely ambulatory  Filed Vitals:   05/17/11 1015  BP: 153/84  Pulse: 64  Temp: 97.6 F (36.4 C)    GENERAL:alert, no distress, well nourished, well developed, comfortable, cooperative and smiling SKIN: positive for: Diffuse papulopustular acneiform eruption encompassing patient's face, neck, chest, and back. It is blanchable. There are small pustules appreciated. HEAD: Normocephalic, papulopustular acneiform eruption appreciated EYES: normal EARS: External ears normal OROPHARYNX:mucous membranes are moist  NECK: supple, no adenopathy, trachea midline, papulopustular acneiform eruption appreciated LYMPH:  no palpable lymphadenopathy, no hepatosplenomegaly BREAST:not examined LUNGS: clear to auscultation and percussion HEART: regular rate & rhythm, no murmurs, no gallops, S1 normal and S2 normal ABDOMEN:abdomen soft, non-tender, normal bowel sounds, no masses  or organomegaly, colostomy noted and producing stool appropriately and no hepatosplenomegaly BACK: Back symmetric, no curvature., No CVA tenderness EXTREMITIES:less then 2 second capillary refill, no joint deformities, effusion, or  inflammation, no edema, no clubbing, no cyanosis  NEURO: alert & oriented x 3 with fluent speech, no focal motor/sensory deficits, gait normal   LABORATORY DATA: CBC    Component Value Date/Time   WBC 8.1 05/04/2011 1018   RBC 4.68 05/04/2011 1018   HGB 13.9 05/04/2011 1018   HCT 41.3 05/04/2011 1018   PLT 211 05/04/2011 1018   MCV 88.2 05/04/2011 1018   MCH 29.7 05/04/2011 1018   MCHC 33.7 05/04/2011 1018   RDW 15.2 05/04/2011 1018   LYMPHSABS 1.7 05/04/2011 1018   MONOABS 0.6 05/04/2011 1018   EOSABS 0.4 05/04/2011 1018   BASOSABS 0.1 05/04/2011 1018      Chemistry      Component Value Date/Time   NA 142 05/04/2011 1018   K 4.1 05/04/2011 1018   CL 110 05/04/2011 1018   CO2 21 05/04/2011 1018   BUN 21 05/04/2011 1018   CREATININE 1.21 05/04/2011 1018      Component Value Date/Time   CALCIUM 9.3 05/04/2011 1018   ALKPHOS 90 05/04/2011 1018   AST 50* 05/04/2011 1018   ALT 131* 05/04/2011 1018   BILITOT 0.3 05/04/2011 1018        ASSESSMENT:  1. 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  2. CABG on 06/09/2009.  3. chronic obstructive pulmonary disease from smoking x many years, though he has quit.  4. Small bowel obstruction requiring surgery in February 2009.  5. Rectal cancer surgery in February 2008 with 1 of 12 positive nodes with LVI and a 4 cm primary, status post 6 cycles of adjuvant oxaliplatin, capecitabine and Avastin since I was concerned he had postoperative disease left behind in the pelvis, and he also received postoperative radiation therapy with concomitant capecitabine at that time.  6. Chronic back pain.  7. Port-A-Cath placed in the past.    PLAN:  1. Doxycycline 100 mg tabs, take BID x 7 days and then daily x 7 days.  E-scribed. 2. Cleocin gel 1 %, apply BID to affected areas, 30 g E-scribed 3. Benadryl for pruritis. 4. Hold chemotherapy for one week.  This was deferred in the plan and is therefore reflected  in the treatment plan. 5. Return in 4 weeks for follow-up 6. Cancel appointment for Thursday  All questions were answered. The patient knows to call the clinic with any problems, questions or concerns. We can certainly see the patient much sooner if necessary.  The patient and plan discussed with Glenford Peers, MD and he is in agreement with the aforementioned. Patient seen and examined by Dr. Mariel Sleet.   Karlei Waldo

## 2011-05-17 NOTE — Progress Notes (Signed)
Labs drawn today for cbc/diff,cmp 

## 2011-05-18 ENCOUNTER — Inpatient Hospital Stay (HOSPITAL_COMMUNITY): Payer: Managed Care, Other (non HMO)

## 2011-05-20 ENCOUNTER — Encounter (HOSPITAL_COMMUNITY): Payer: Managed Care, Other (non HMO)

## 2011-05-20 ENCOUNTER — Ambulatory Visit (HOSPITAL_COMMUNITY): Payer: Managed Care, Other (non HMO) | Admitting: Oncology

## 2011-05-24 ENCOUNTER — Other Ambulatory Visit (HOSPITAL_COMMUNITY): Payer: Managed Care, Other (non HMO)

## 2011-05-25 ENCOUNTER — Encounter (HOSPITAL_COMMUNITY): Payer: Managed Care, Other (non HMO) | Attending: Oncology

## 2011-05-25 ENCOUNTER — Inpatient Hospital Stay (HOSPITAL_COMMUNITY): Payer: Managed Care, Other (non HMO)

## 2011-05-25 VITALS — BP 159/59 | HR 66 | Temp 97.9°F | Wt 218.0 lb

## 2011-05-25 DIAGNOSIS — Z5111 Encounter for antineoplastic chemotherapy: Secondary | ICD-10-CM

## 2011-05-25 DIAGNOSIS — R21 Rash and other nonspecific skin eruption: Secondary | ICD-10-CM | POA: Insufficient documentation

## 2011-05-25 DIAGNOSIS — C19 Malignant neoplasm of rectosigmoid junction: Secondary | ICD-10-CM

## 2011-05-25 DIAGNOSIS — C7800 Secondary malignant neoplasm of unspecified lung: Secondary | ICD-10-CM

## 2011-05-25 DIAGNOSIS — C20 Malignant neoplasm of rectum: Secondary | ICD-10-CM | POA: Insufficient documentation

## 2011-05-25 MED ORDER — SODIUM CHLORIDE 0.9 % IV SOLN: 8.0000 mg | Freq: Once | INTRAVENOUS | Status: DC

## 2011-05-25 MED ORDER — LEUCOVORIN CALCIUM INJECTION 100 MG
46.0000 mg | Freq: Once | INTRAMUSCULAR | Status: AC
  Administered 2011-05-25: 46 mg via INTRAVENOUS
  Filled 2011-05-25: qty 2.3

## 2011-05-25 MED ORDER — CETUXIMAB CHEMO IV INJECTION 200 MG/100ML
250.0000 mg/m2 | Freq: Once | INTRAVENOUS | Status: AC
  Administered 2011-05-25: 600 mg via INTRAVENOUS
  Filled 2011-05-25: qty 300

## 2011-05-25 MED ORDER — DIPHENHYDRAMINE HCL 50 MG/ML IJ SOLN
INTRAMUSCULAR | Status: AC
  Administered 2011-05-25: 50 mg via INTRAVENOUS
  Filled 2011-05-25: qty 1

## 2011-05-25 MED ORDER — LEUCOVORIN CALCIUM INJECTION 350 MG: 400.0000 mg/m2 | Freq: Once | INTRAVENOUS | Status: DC

## 2011-05-25 MED ORDER — DEXAMETHASONE SODIUM PHOSPHATE 10 MG/ML IJ SOLN: 10.0000 mg | Freq: Once | INTRAMUSCULAR | Status: DC

## 2011-05-25 MED ORDER — DIPHENHYDRAMINE HCL 50 MG/ML IJ SOLN
50.0000 mg | Freq: Once | INTRAMUSCULAR | Status: AC
  Administered 2011-05-25: 50 mg via INTRAVENOUS

## 2011-05-25 MED ORDER — SODIUM CHLORIDE 0.9 % IV SOLN
Freq: Once | INTRAVENOUS | Status: AC
  Administered 2011-05-25: 8 mg via INTRAVENOUS
  Filled 2011-05-25: qty 4

## 2011-05-25 MED ORDER — SODIUM CHLORIDE 0.9 % IV SOLN
2400.0000 mg/m2 | INTRAVENOUS | Status: DC
  Administered 2011-05-25: 5350 mg via INTRAVENOUS
  Filled 2011-05-25 (×2): qty 107

## 2011-05-25 MED ORDER — HEPARIN SOD (PORK) LOCK FLUSH 100 UNIT/ML IV SOLN
500.0000 [IU] | Freq: Once | INTRAVENOUS | Status: DC | PRN
  Filled 2011-05-25: qty 5

## 2011-05-25 MED ORDER — FLUOROURACIL CHEMO INJECTION 2.5 GM/50ML
400.0000 mg/m2 | Freq: Once | INTRAVENOUS | Status: AC
  Administered 2011-05-25: 900 mg via INTRAVENOUS
  Filled 2011-05-25: qty 18

## 2011-05-25 NOTE — Progress Notes (Signed)
Tolerated chemo well. Home with continuous infusion pump 60fu.

## 2011-05-27 ENCOUNTER — Encounter (HOSPITAL_BASED_OUTPATIENT_CLINIC_OR_DEPARTMENT_OTHER): Payer: Managed Care, Other (non HMO)

## 2011-05-27 ENCOUNTER — Encounter (HOSPITAL_COMMUNITY): Payer: Managed Care, Other (non HMO)

## 2011-05-27 DIAGNOSIS — C7800 Secondary malignant neoplasm of unspecified lung: Secondary | ICD-10-CM

## 2011-05-27 DIAGNOSIS — Z452 Encounter for adjustment and management of vascular access device: Secondary | ICD-10-CM

## 2011-05-27 DIAGNOSIS — C20 Malignant neoplasm of rectum: Secondary | ICD-10-CM

## 2011-05-27 DIAGNOSIS — C19 Malignant neoplasm of rectosigmoid junction: Secondary | ICD-10-CM

## 2011-05-27 DIAGNOSIS — R21 Rash and other nonspecific skin eruption: Secondary | ICD-10-CM

## 2011-05-27 MED ORDER — HEPARIN SOD (PORK) LOCK FLUSH 100 UNIT/ML IV SOLN: 250.0000 [IU] | Freq: Once | INTRAVENOUS | Status: AC | PRN

## 2011-05-27 MED ORDER — CLINDAMYCIN PHOSPHATE 1 % EX GEL: Freq: Two times a day (BID) | CUTANEOUS | Status: DC

## 2011-05-27 MED ORDER — HEPARIN SOD (PORK) LOCK FLUSH 100 UNIT/ML IV SOLN
INTRAVENOUS | Status: AC
  Filled 2011-05-27: qty 5

## 2011-05-27 MED ORDER — HEPARIN SOD (PORK) LOCK FLUSH 100 UNIT/ML IV SOLN
INTRAVENOUS | Status: AC
  Administered 2011-05-27: 500 [IU]
  Filled 2011-05-27: qty 5

## 2011-05-27 MED ORDER — SODIUM CHLORIDE 0.9 % IJ SOLN
INTRAMUSCULAR | Status: AC
  Filled 2011-05-27: qty 10

## 2011-05-27 MED ORDER — SODIUM CHLORIDE 0.9 % IJ SOLN
INTRAMUSCULAR | Status: AC
Start: 2011-05-27 — End: 2011-05-27
  Administered 2011-05-27: 10 mL
  Filled 2011-05-27: qty 10

## 2011-05-27 MED ORDER — HEPARIN SOD (PORK) LOCK FLUSH 100 UNIT/ML IV SOLN
500.0000 [IU] | Freq: Once | INTRAVENOUS | Status: AC | PRN
  Administered 2011-05-27: 500 [IU]
  Filled 2011-05-27: qty 5

## 2011-05-27 MED ORDER — SODIUM CHLORIDE 0.9 % IJ SOLN
10.0000 mL | INTRAMUSCULAR | Status: DC | PRN
  Administered 2011-05-27: 10 mL
  Filled 2011-05-27: qty 10

## 2011-05-27 NOTE — Progress Notes (Signed)
Addended by: Sterling Big on: 05/27/2011 04:52 PM   Modules accepted: Orders

## 2011-05-27 NOTE — Progress Notes (Signed)
Martin Maldonado presented for pump d/c  and flush. Proper placement of portacath confirmed by CXR. Portacath located lt chest wall access in place. Good blood return present. Portacath flushed with 20ml NS and 500U/38ml Heparin and needle removed intact. Procedure without incident. Patient tolerated procedure well.  Tolerated chemo well.

## 2011-05-31 ENCOUNTER — Encounter (HOSPITAL_BASED_OUTPATIENT_CLINIC_OR_DEPARTMENT_OTHER): Payer: Managed Care, Other (non HMO)

## 2011-05-31 DIAGNOSIS — C20 Malignant neoplasm of rectum: Secondary | ICD-10-CM

## 2011-05-31 LAB — DIFFERENTIAL
Basophils Relative: 0 % (ref 0–1)
Lymphs Abs: 3.7 10*3/uL (ref 0.7–4.0)
Monocytes Absolute: 0.7 10*3/uL (ref 0.1–1.0)
Monocytes Relative: 5 % (ref 3–12)
Neutro Abs: 11.6 10*3/uL — ABNORMAL HIGH (ref 1.7–7.7)

## 2011-05-31 LAB — CBC
HCT: 43.1 % (ref 39.0–52.0)
Hemoglobin: 14.7 g/dL (ref 13.0–17.0)
MCH: 30.2 pg (ref 26.0–34.0)
MCHC: 34.1 g/dL (ref 30.0–36.0)
RBC: 4.87 MIL/uL (ref 4.22–5.81)

## 2011-05-31 NOTE — Progress Notes (Signed)
Labs drawn today for cbc/diff 

## 2011-06-01 ENCOUNTER — Encounter (HOSPITAL_BASED_OUTPATIENT_CLINIC_OR_DEPARTMENT_OTHER): Payer: Managed Care, Other (non HMO)

## 2011-06-01 ENCOUNTER — Inpatient Hospital Stay (HOSPITAL_COMMUNITY): Payer: Managed Care, Other (non HMO)

## 2011-06-01 VITALS — BP 135/80 | HR 58 | Temp 97.7°F

## 2011-06-01 DIAGNOSIS — C19 Malignant neoplasm of rectosigmoid junction: Secondary | ICD-10-CM

## 2011-06-01 DIAGNOSIS — C20 Malignant neoplasm of rectum: Secondary | ICD-10-CM

## 2011-06-01 DIAGNOSIS — C7800 Secondary malignant neoplasm of unspecified lung: Secondary | ICD-10-CM

## 2011-06-01 DIAGNOSIS — Z5112 Encounter for antineoplastic immunotherapy: Secondary | ICD-10-CM

## 2011-06-01 MED ORDER — SODIUM CHLORIDE 0.9 % IV SOLN: Freq: Once | INTRAVENOUS | Status: DC

## 2011-06-01 MED ORDER — SODIUM CHLORIDE 0.9 % IJ SOLN
10.0000 mL | INTRAMUSCULAR | Status: DC | PRN
  Administered 2011-06-01: 10 mL
  Filled 2011-06-01: qty 10

## 2011-06-01 MED ORDER — HEPARIN SOD (PORK) LOCK FLUSH 100 UNIT/ML IV SOLN
500.0000 [IU] | Freq: Once | INTRAVENOUS | Status: AC | PRN
  Administered 2011-06-01: 500 [IU]
  Filled 2011-06-01: qty 5

## 2011-06-01 MED ORDER — DIPHENHYDRAMINE HCL 50 MG/ML IJ SOLN
INTRAMUSCULAR | Status: AC
  Administered 2011-06-01: 50 mg via INTRAVENOUS
  Filled 2011-06-01: qty 1

## 2011-06-01 MED ORDER — CETUXIMAB CHEMO IV INJECTION 200 MG/100ML
250.0000 mg/m2 | Freq: Once | INTRAVENOUS | Status: AC
  Administered 2011-06-01: 600 mg via INTRAVENOUS
  Filled 2011-06-01: qty 300

## 2011-06-01 MED ORDER — DIPHENHYDRAMINE HCL 50 MG/ML IJ SOLN
50.0000 mg | Freq: Once | INTRAMUSCULAR | Status: AC
  Administered 2011-06-01: 50 mg via INTRAVENOUS

## 2011-06-01 NOTE — Progress Notes (Signed)
Martin Maldonado tolerated infusion well and without incident; verbalizes understanding for follow-up.  No distress noted at time of discharge and patient was discharged home with himself.

## 2011-06-03 ENCOUNTER — Encounter (HOSPITAL_COMMUNITY): Payer: Managed Care, Other (non HMO)

## 2011-06-07 ENCOUNTER — Encounter (HOSPITAL_BASED_OUTPATIENT_CLINIC_OR_DEPARTMENT_OTHER): Payer: Managed Care, Other (non HMO)

## 2011-06-07 ENCOUNTER — Encounter (HOSPITAL_BASED_OUTPATIENT_CLINIC_OR_DEPARTMENT_OTHER): Payer: Managed Care, Other (non HMO) | Admitting: Oncology

## 2011-06-07 ENCOUNTER — Encounter (HOSPITAL_COMMUNITY): Payer: Self-pay | Admitting: Oncology

## 2011-06-07 VITALS — BP 147/82 | HR 60 | Temp 97.4°F | Wt 218.9 lb

## 2011-06-07 DIAGNOSIS — C19 Malignant neoplasm of rectosigmoid junction: Secondary | ICD-10-CM

## 2011-06-07 DIAGNOSIS — C20 Malignant neoplasm of rectum: Secondary | ICD-10-CM

## 2011-06-07 DIAGNOSIS — C7800 Secondary malignant neoplasm of unspecified lung: Secondary | ICD-10-CM

## 2011-06-07 LAB — COMPREHENSIVE METABOLIC PANEL
ALT: 38 U/L (ref 0–53)
AST: 24 U/L (ref 0–37)
Albumin: 3.8 g/dL (ref 3.5–5.2)
Alkaline Phosphatase: 109 U/L (ref 39–117)
Calcium: 9.7 mg/dL (ref 8.4–10.5)
GFR calc Af Amer: 55 mL/min — ABNORMAL LOW (ref >90)
Glucose, Bld: 87 mg/dL (ref 70–99)
Potassium: 4.2 mEq/L (ref 3.5–5.1)
Sodium: 138 mEq/L (ref 135–145)
Total Protein: 7.3 g/dL (ref 6.0–8.3)

## 2011-06-07 LAB — CBC
MCH: 29.7 pg (ref 26.0–34.0)
Platelets: 182 10*3/uL (ref 150–400)
RBC: 4.58 MIL/uL (ref 4.22–5.81)
RDW: 16.9 % — ABNORMAL HIGH (ref 11.5–15.5)
WBC: 7.2 10*3/uL (ref 4.0–10.5)

## 2011-06-07 LAB — DIFFERENTIAL
Basophils Absolute: 0 10*3/uL (ref 0.0–0.1)
Eosinophils Absolute: 0.2 10*3/uL (ref 0.0–0.7)
Eosinophils Relative: 3 % (ref 0–5)
Lymphocytes Relative: 19 % (ref 12–46)
Lymphs Abs: 1.4 10*3/uL (ref 0.7–4.0)
Neutrophils Relative %: 67 % (ref 43–77)

## 2011-06-07 NOTE — Progress Notes (Signed)
Martin Peri, MD, MD [address]  [place] [address]  1. Rectal cancer     CURRENT THERAPY: S/P cycle 2 of 5-FU and Leucovorin and S/P cycle 1 of weekly Cetuximab.   INTERVAL HISTORY: Martin Maldonado 56 y.o. 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  The patient denies any complaints.  His rash secondary to Cetuximab is much improved.  It is only occasionally pruritic.  He is using moisturizing lotion and this help with the rash and pruritis.  The patient reports that he has been playing a lot of video games lately.  I encouraged him to get outside more when the weather improves.  He reports that he will and as a matter of fact, he was purchasing a 16 foot boat today in preparation for fishing.   Otherwise, the patient reports a sebaceous cyst on his back that recently drained fluid.  He reports that he had it removed in the past, but it occassionally fills up and causes discomfort until it bursts.  When it breaks, blood and yellow/white discharge is released.  Today it looks like it is healing.  It is non-infected appearing and no discharge.  No tenderness to palpation.  ROS: No TIA's or unusual headaches, no dysphagia.  No prolonged cough. No dyspnea or chest pain on exertion.  No abdominal pain, change in bowel habits, black or bloody stools.  No urinary tract symptoms.  No new or unusual musculoskeletal symptoms.    Past Medical History  Diagnosis Date  . Hypertension   . Rectal cancer 2008    Adenocarcinoma; AP resection in 2008; recurrence with lung metastases in 2011  . Arteriosclerotic cardiovascular disease (ASCVD) 2011    CABG surgery in 2011  . Hyperlipidemia   . 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 Splenomegaly 01/23/2009    Additional GI history includes small bowel obstruction, requiring laparotomy and lysis of adhesions in 2009, remote peptic ulcer disease and postoperative  perirectal infection  . ALCOHOL ABUSE, HX OF 01/23/2009    has HYPERLIPIDEMIA; Tobacco abuse, in remission; HYPERTENSION; COPD; SPLENOMEGALY; SMALL BOWEL OBSTRUCTION, HX OF; and Rectal cancer on his problem list.      has no known allergies.  [name] does not currently have medications on file.  Past Surgical History  Procedure Date  . Irrigation and debridement sebaceous cyst   . Laparoscopic lysis intestinal adhesions 2009    With incidental appendectomy  . Coronary artery bypass graft 2011    2 vessel  . Abdominoperineal proctocolectomy 2008  . Portacath placement 2011    Denies any headaches, dizziness, double vision, fevers, chills, night sweats, nausea, vomiting, diarrhea, constipation, chest pain, heart palpitations, shortness of breath, blood in stool, black tarry stool, urinary pain, urinary burning, urinary frequency, hematuria.   PHYSICAL EXAMINATION  ECOG PERFORMANCE STATUS: 1 - Symptomatic but completely ambulatory  Filed Vitals:   06/07/11 1149  BP: 147/82  Pulse: 60  Temp: 97.4 F (36.3 C)    GENERAL:alert, no distress, well nourished, well developed, comfortable, cooperative and smiling SKIN: skin color, texture, turgor are normal, no rashes or significant lesions HEAD: Normocephalic, No masses, lesions, tenderness or abnormalities EYES: normal, PERRLA, EOMI, Conjunctiva are pink and non-injected EARS: External ears normal OROPHARYNX:lips, buccal mucosa, and tongue normal and mucous membranes are moist  NECK: supple, trachea midline LYMPH:  no palpable lymphadenopathy BREAST:not examined LUNGS: clear to auscultation and percussion  HEART: regular rate & rhythm, no murmurs, no gallops, S1 normal and S2 normal ABDOMEN:abdomen soft, non-tender and normal bowel sounds BACK: Back symmetric, no curvature., No CVA tenderness, healing, flat, 1 cm round wound that is mildly erythematous surrounding the wound, no discharge or tenderness appreciated just right  lateral to the midline in the thoracic region. EXTREMITIES:less then 2 second capillary refill, no joint deformities, effusion, or inflammation, no edema, no skin discoloration, no clubbing, no cyanosis  NEURO: alert & oriented x 3 with fluent speech, no focal motor/sensory deficits, gait normal   PENDING LABS: Pre-chemo lab work: CBC diff, CMET    ASSESSMENT:  1. 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 2. Rash secondary to Cetuximab, improved 3. CABG on 06/09/2009.  4. Chronic obstructive pulmonary disease from smoking x many years, though he has quit.  5. Small bowel obstruction requiring surgery in February 2009.  6. Rectal cancer surgery in February 2008 with 1 of 12 positive nodes with LVI and a 4 cm primary, status post 6 cycles of adjuvant oxaliplatin, capecitabine and Avastin since there was concerned he had postoperative disease left behind in the pelvis, and he also received postoperative radiation therapy with concomitant capecitabine at that time.  7. Chronic back pain.    PLAN:  1. Pre-chemo lab work today: CBC diff, CMET 2. Continue symptom management of drug induced rash 3. Chemotherapy as scheduled tomorrow pending lab results. 4. Encouraged the patient to be more active as the weather begins to change for the better.  5. Return in 1 month for follow-up.  Will discuss restaging scans at that time.    All questions were answered. The patient knows to call the clinic with any problems, questions or concerns. We can certainly see the patient much sooner if necessary.  The patient and plan discussed with Glenford Peers, MD and he is in agreement with the aforementioned.   Nakisha Chai

## 2011-06-08 ENCOUNTER — Inpatient Hospital Stay (HOSPITAL_COMMUNITY): Payer: Managed Care, Other (non HMO)

## 2011-06-08 ENCOUNTER — Encounter (HOSPITAL_BASED_OUTPATIENT_CLINIC_OR_DEPARTMENT_OTHER): Payer: Managed Care, Other (non HMO)

## 2011-06-08 VITALS — BP 126/69 | HR 65 | Temp 97.0°F | Wt 217.0 lb

## 2011-06-08 DIAGNOSIS — C19 Malignant neoplasm of rectosigmoid junction: Secondary | ICD-10-CM

## 2011-06-08 DIAGNOSIS — Z5111 Encounter for antineoplastic chemotherapy: Secondary | ICD-10-CM

## 2011-06-08 DIAGNOSIS — C20 Malignant neoplasm of rectum: Secondary | ICD-10-CM

## 2011-06-08 DIAGNOSIS — C7800 Secondary malignant neoplasm of unspecified lung: Secondary | ICD-10-CM

## 2011-06-08 MED ORDER — SODIUM CHLORIDE 0.9 % IV SOLN
2400.0000 mg/m2 | INTRAVENOUS | Status: DC
  Administered 2011-06-08: 5350 mg via INTRAVENOUS
  Filled 2011-06-08 (×2): qty 107

## 2011-06-08 MED ORDER — SODIUM CHLORIDE 0.9 % IV SOLN
Freq: Once | INTRAVENOUS | Status: AC
  Administered 2011-06-08: 8 mg via INTRAVENOUS
  Filled 2011-06-08: qty 4

## 2011-06-08 MED ORDER — CETUXIMAB CHEMO IV INJECTION 200 MG/100ML
250.0000 mg/m2 | Freq: Once | INTRAVENOUS | Status: AC
  Administered 2011-06-08: 600 mg via INTRAVENOUS
  Filled 2011-06-08: qty 300

## 2011-06-08 MED ORDER — SODIUM CHLORIDE 0.9 % IV SOLN: 8.0000 mg | Freq: Once | INTRAVENOUS | Status: DC

## 2011-06-08 MED ORDER — DEXAMETHASONE SODIUM PHOSPHATE 10 MG/ML IJ SOLN: 10.0000 mg | Freq: Once | INTRAMUSCULAR | Status: DC

## 2011-06-08 MED ORDER — LEUCOVORIN CALCIUM INJECTION 100 MG
46.0000 mg | Freq: Once | INTRAMUSCULAR | Status: AC
  Administered 2011-06-08: 46 mg via INTRAVENOUS
  Filled 2011-06-08: qty 2.3

## 2011-06-08 MED ORDER — DIPHENHYDRAMINE HCL 50 MG/ML IJ SOLN
INTRAMUSCULAR | Status: AC
  Filled 2011-06-08: qty 1

## 2011-06-08 MED ORDER — FLUOROURACIL CHEMO INJECTION 2.5 GM/50ML
400.0000 mg/m2 | Freq: Once | INTRAVENOUS | Status: AC
  Administered 2011-06-08: 900 mg via INTRAVENOUS
  Filled 2011-06-08: qty 18

## 2011-06-08 MED ORDER — DIPHENHYDRAMINE HCL 50 MG/ML IJ SOLN
50.0000 mg | Freq: Once | INTRAMUSCULAR | Status: AC
  Administered 2011-06-08: 50 mg via INTRAVENOUS

## 2011-06-08 MED ORDER — DEXTROSE 5 % IV SOLN: 400.0000 mg/m2 | Freq: Once | INTRAVENOUS | Status: DC

## 2011-06-08 MED ORDER — SODIUM CHLORIDE 0.9 % IV SOLN
Freq: Once | INTRAVENOUS | Status: AC
  Administered 2011-06-08: 09:00:00 via INTRAVENOUS

## 2011-06-10 ENCOUNTER — Encounter (HOSPITAL_COMMUNITY): Payer: Managed Care, Other (non HMO)

## 2011-06-10 ENCOUNTER — Encounter (HOSPITAL_BASED_OUTPATIENT_CLINIC_OR_DEPARTMENT_OTHER): Payer: Managed Care, Other (non HMO)

## 2011-06-10 DIAGNOSIS — C20 Malignant neoplasm of rectum: Secondary | ICD-10-CM

## 2011-06-10 DIAGNOSIS — C19 Malignant neoplasm of rectosigmoid junction: Secondary | ICD-10-CM

## 2011-06-10 DIAGNOSIS — Z452 Encounter for adjustment and management of vascular access device: Secondary | ICD-10-CM

## 2011-06-10 DIAGNOSIS — C7800 Secondary malignant neoplasm of unspecified lung: Secondary | ICD-10-CM

## 2011-06-10 MED ORDER — HEPARIN SOD (PORK) LOCK FLUSH 100 UNIT/ML IV SOLN
500.0000 [IU] | Freq: Once | INTRAVENOUS | Status: AC | PRN
  Administered 2011-06-10: 500 [IU]
  Filled 2011-06-10: qty 5

## 2011-06-10 MED ORDER — HEPARIN SOD (PORK) LOCK FLUSH 100 UNIT/ML IV SOLN
INTRAVENOUS | Status: AC
  Filled 2011-06-10: qty 5

## 2011-06-10 MED ORDER — SODIUM CHLORIDE 0.9 % IJ SOLN
10.0000 mL | INTRAMUSCULAR | Status: DC | PRN
  Administered 2011-06-10: 10 mL
  Filled 2011-06-10: qty 10

## 2011-06-10 MED ORDER — SODIUM CHLORIDE 0.9 % IJ SOLN
INTRAMUSCULAR | Status: AC
  Filled 2011-06-10: qty 10

## 2011-06-14 ENCOUNTER — Encounter (HOSPITAL_BASED_OUTPATIENT_CLINIC_OR_DEPARTMENT_OTHER): Payer: Managed Care, Other (non HMO)

## 2011-06-14 DIAGNOSIS — C20 Malignant neoplasm of rectum: Secondary | ICD-10-CM

## 2011-06-14 LAB — CBC
HCT: 42.8 % (ref 39.0–52.0)
Hemoglobin: 14 g/dL (ref 13.0–17.0)
MCH: 30.2 pg (ref 26.0–34.0)
MCV: 92.4 fL (ref 78.0–100.0)
RBC: 4.63 MIL/uL (ref 4.22–5.81)

## 2011-06-14 LAB — DIFFERENTIAL
Eosinophils Absolute: 0.3 10*3/uL (ref 0.0–0.7)
Eosinophils Relative: 4 % (ref 0–5)
Lymphs Abs: 2.7 10*3/uL (ref 0.7–4.0)
Monocytes Relative: 7 % (ref 3–12)

## 2011-06-14 NOTE — Progress Notes (Signed)
Labs drawn today for cbc/diff 

## 2011-06-15 ENCOUNTER — Encounter (HOSPITAL_BASED_OUTPATIENT_CLINIC_OR_DEPARTMENT_OTHER): Payer: Managed Care, Other (non HMO)

## 2011-06-15 ENCOUNTER — Inpatient Hospital Stay (HOSPITAL_COMMUNITY): Payer: Managed Care, Other (non HMO)

## 2011-06-15 VITALS — BP 158/80 | HR 71 | Temp 97.0°F | Ht 71.0 in | Wt 217.0 lb

## 2011-06-15 DIAGNOSIS — C20 Malignant neoplasm of rectum: Secondary | ICD-10-CM

## 2011-06-15 DIAGNOSIS — Z5112 Encounter for antineoplastic immunotherapy: Secondary | ICD-10-CM

## 2011-06-15 DIAGNOSIS — C7800 Secondary malignant neoplasm of unspecified lung: Secondary | ICD-10-CM

## 2011-06-15 DIAGNOSIS — C19 Malignant neoplasm of rectosigmoid junction: Secondary | ICD-10-CM

## 2011-06-15 MED ORDER — SODIUM CHLORIDE 0.9 % IJ SOLN
10.0000 mL | INTRAMUSCULAR | Status: DC | PRN
  Administered 2011-06-15 (×2): 10 mL
  Filled 2011-06-15: qty 10

## 2011-06-15 MED ORDER — SODIUM CHLORIDE 0.9 % IJ SOLN
INTRAMUSCULAR | Status: AC
  Administered 2011-06-15: 10 mL
  Filled 2011-06-15: qty 10

## 2011-06-15 MED ORDER — SODIUM CHLORIDE 0.9 % IJ SOLN
INTRAMUSCULAR | Status: AC
  Filled 2011-06-15: qty 10

## 2011-06-15 MED ORDER — DIPHENHYDRAMINE HCL 50 MG/ML IJ SOLN
50.0000 mg | Freq: Once | INTRAMUSCULAR | Status: AC
  Administered 2011-06-15: 50 mg via INTRAVENOUS

## 2011-06-15 MED ORDER — HEPARIN SOD (PORK) LOCK FLUSH 100 UNIT/ML IV SOLN
INTRAVENOUS | Status: AC
  Filled 2011-06-15: qty 5

## 2011-06-15 MED ORDER — CETUXIMAB CHEMO IV INJECTION 200 MG/100ML
250.0000 mg/m2 | Freq: Once | INTRAVENOUS | Status: AC
  Administered 2011-06-15: 600 mg via INTRAVENOUS
  Filled 2011-06-15: qty 300

## 2011-06-15 MED ORDER — HEPARIN SOD (PORK) LOCK FLUSH 100 UNIT/ML IV SOLN
500.0000 [IU] | Freq: Once | INTRAVENOUS | Status: AC | PRN
  Administered 2011-06-15: 500 [IU]
  Filled 2011-06-15: qty 5

## 2011-06-15 MED ORDER — SODIUM CHLORIDE 0.9 % IV SOLN
Freq: Once | INTRAVENOUS | Status: AC
  Administered 2011-06-15: 09:00:00 via INTRAVENOUS

## 2011-06-15 MED ORDER — DIPHENHYDRAMINE HCL 50 MG/ML IJ SOLN
INTRAMUSCULAR | Status: AC
  Administered 2011-06-15: 50 mg via INTRAVENOUS
  Filled 2011-06-15: qty 1

## 2011-06-15 NOTE — Progress Notes (Deleted)
Martin Maldonado tolerated infusion well and without incident; verbalizes understanding for follow-up.  No distress noted at time of discharge and patient was discharged home by himself.

## 2011-06-15 NOTE — Progress Notes (Signed)
Tolerated well

## 2011-06-17 ENCOUNTER — Encounter (HOSPITAL_COMMUNITY): Payer: Managed Care, Other (non HMO)

## 2011-06-21 ENCOUNTER — Encounter (HOSPITAL_COMMUNITY): Payer: Managed Care, Other (non HMO) | Attending: Oncology

## 2011-06-21 DIAGNOSIS — C20 Malignant neoplasm of rectum: Secondary | ICD-10-CM | POA: Insufficient documentation

## 2011-06-21 LAB — DIFFERENTIAL
Basophils Relative: 1 % (ref 0–1)
Eosinophils Absolute: 0.3 10*3/uL (ref 0.0–0.7)
Eosinophils Relative: 4 % (ref 0–5)
Monocytes Relative: 8 % (ref 3–12)
Neutrophils Relative %: 71 % (ref 43–77)

## 2011-06-21 LAB — CBC
Hemoglobin: 13.6 g/dL (ref 13.0–17.0)
MCH: 30.7 pg (ref 26.0–34.0)
MCHC: 33 g/dL (ref 30.0–36.0)
MCV: 93 fL (ref 78.0–100.0)

## 2011-06-22 ENCOUNTER — Encounter (HOSPITAL_BASED_OUTPATIENT_CLINIC_OR_DEPARTMENT_OTHER): Payer: Managed Care, Other (non HMO)

## 2011-06-22 VITALS — BP 155/77 | HR 61 | Temp 97.8°F | Wt 217.0 lb

## 2011-06-22 DIAGNOSIS — C7800 Secondary malignant neoplasm of unspecified lung: Secondary | ICD-10-CM

## 2011-06-22 DIAGNOSIS — C19 Malignant neoplasm of rectosigmoid junction: Secondary | ICD-10-CM

## 2011-06-22 DIAGNOSIS — Z5111 Encounter for antineoplastic chemotherapy: Secondary | ICD-10-CM

## 2011-06-22 DIAGNOSIS — C20 Malignant neoplasm of rectum: Secondary | ICD-10-CM

## 2011-06-22 MED ORDER — FLUOROURACIL CHEMO INJECTION 2.5 GM/50ML
400.0000 mg/m2 | Freq: Once | INTRAVENOUS | Status: AC
  Administered 2011-06-22: 900 mg via INTRAVENOUS
  Filled 2011-06-22: qty 18

## 2011-06-22 MED ORDER — DEXAMETHASONE SODIUM PHOSPHATE 10 MG/ML IJ SOLN: 10.0000 mg | Freq: Once | INTRAMUSCULAR | Status: DC

## 2011-06-22 MED ORDER — DIPHENHYDRAMINE HCL 50 MG/ML IJ SOLN
50.0000 mg | Freq: Once | INTRAMUSCULAR | Status: AC
  Administered 2011-06-22: 50 mg via INTRAVENOUS

## 2011-06-22 MED ORDER — SODIUM CHLORIDE 0.9 % IV SOLN
Freq: Once | INTRAVENOUS | Status: AC
  Administered 2011-06-22: 500 mL via INTRAVENOUS

## 2011-06-22 MED ORDER — LEUCOVORIN CALCIUM INJECTION 100 MG
46.0000 mg | Freq: Once | INTRAMUSCULAR | Status: AC
  Administered 2011-06-22: 46 mg via INTRAVENOUS
  Filled 2011-06-22: qty 2.3

## 2011-06-22 MED ORDER — SODIUM CHLORIDE 0.9 % IV SOLN: 8.0000 mg | Freq: Once | INTRAVENOUS | Status: DC

## 2011-06-22 MED ORDER — SODIUM CHLORIDE 0.9 % IJ SOLN
10.0000 mL | INTRAMUSCULAR | Status: DC | PRN
  Filled 2011-06-22: qty 10

## 2011-06-22 MED ORDER — DIPHENHYDRAMINE HCL 50 MG/ML IJ SOLN
INTRAMUSCULAR | Status: AC
  Filled 2011-06-22: qty 1

## 2011-06-22 MED ORDER — DEXTROSE 5 % IV SOLN
50.0000 mL | INTRAVENOUS | Status: AC
  Filled 2011-06-22 (×2): qty 50

## 2011-06-22 MED ORDER — FLUOROURACIL CHEMO INJECTION 5 GM/100ML
2400.0000 mg/m2 | INTRAVENOUS | Status: DC
  Administered 2011-06-22: 5350 mg via INTRAVENOUS
  Filled 2011-06-22 (×2): qty 107

## 2011-06-22 MED ORDER — SODIUM CHLORIDE 0.9 % IV SOLN
Freq: Once | INTRAVENOUS | Status: AC
  Administered 2011-06-22: 8 mg via INTRAVENOUS
  Filled 2011-06-22: qty 4

## 2011-06-22 MED ORDER — LEUCOVORIN CALCIUM INJECTION 350 MG: 400.0000 mg/m2 | Freq: Once | INTRAMUSCULAR | Status: DC

## 2011-06-22 MED ORDER — HEPARIN SOD (PORK) LOCK FLUSH 100 UNIT/ML IV SOLN
500.0000 [IU] | Freq: Once | INTRAVENOUS | Status: DC | PRN
  Filled 2011-06-22: qty 5

## 2011-06-22 MED ORDER — CETUXIMAB CHEMO IV INJECTION 200 MG/100ML
250.0000 mg/m2 | Freq: Once | INTRAVENOUS | Status: AC
  Administered 2011-06-22: 600 mg via INTRAVENOUS
  Filled 2011-06-22: qty 300

## 2011-06-22 NOTE — Progress Notes (Signed)
Martin Maldonado tolerated infusions well and without incident; verbalizes understanding for follow-up.  No distress noted at time of discharge and patient was discharged home by himself.  

## 2011-06-24 ENCOUNTER — Encounter (HOSPITAL_BASED_OUTPATIENT_CLINIC_OR_DEPARTMENT_OTHER): Payer: Managed Care, Other (non HMO)

## 2011-06-24 DIAGNOSIS — C7800 Secondary malignant neoplasm of unspecified lung: Secondary | ICD-10-CM

## 2011-06-24 DIAGNOSIS — C19 Malignant neoplasm of rectosigmoid junction: Secondary | ICD-10-CM

## 2011-06-24 DIAGNOSIS — C20 Malignant neoplasm of rectum: Secondary | ICD-10-CM

## 2011-06-24 MED ORDER — SODIUM CHLORIDE 0.9 % IJ SOLN
INTRAMUSCULAR | Status: AC
  Filled 2011-06-24: qty 20

## 2011-06-24 MED ORDER — HEPARIN SOD (PORK) LOCK FLUSH 100 UNIT/ML IV SOLN
500.0000 [IU] | Freq: Once | INTRAVENOUS | Status: AC
  Administered 2011-06-24: 500 [IU] via INTRAVENOUS
  Filled 2011-06-24: qty 5

## 2011-06-24 MED ORDER — HEPARIN SOD (PORK) LOCK FLUSH 100 UNIT/ML IV SOLN
INTRAVENOUS | Status: AC
  Filled 2011-06-24: qty 5

## 2011-06-24 MED ORDER — SODIUM CHLORIDE 0.9 % IJ SOLN
20.0000 mL | INTRAMUSCULAR | Status: DC | PRN
  Administered 2011-06-24: 20 mL via INTRAVENOUS
  Filled 2011-06-24: qty 20

## 2011-06-28 ENCOUNTER — Encounter (HOSPITAL_BASED_OUTPATIENT_CLINIC_OR_DEPARTMENT_OTHER): Payer: Managed Care, Other (non HMO)

## 2011-06-28 DIAGNOSIS — C7800 Secondary malignant neoplasm of unspecified lung: Secondary | ICD-10-CM

## 2011-06-28 DIAGNOSIS — C20 Malignant neoplasm of rectum: Secondary | ICD-10-CM

## 2011-06-28 DIAGNOSIS — C19 Malignant neoplasm of rectosigmoid junction: Secondary | ICD-10-CM

## 2011-06-28 LAB — DIFFERENTIAL
Basophils Relative: 1 % (ref 0–1)
Lymphocytes Relative: 36 % (ref 12–46)
Lymphs Abs: 2.9 10*3/uL (ref 0.7–4.0)
Monocytes Relative: 8 % (ref 3–12)
Neutro Abs: 4.4 10*3/uL (ref 1.7–7.7)
Neutrophils Relative %: 54 % (ref 43–77)

## 2011-06-28 LAB — CBC
Hemoglobin: 13.4 g/dL (ref 13.0–17.0)
MCHC: 33.3 g/dL (ref 30.0–36.0)
RBC: 4.35 MIL/uL (ref 4.22–5.81)
WBC: 8.1 10*3/uL (ref 4.0–10.5)

## 2011-06-28 NOTE — Progress Notes (Signed)
Labs drawn today for cbc/diff 

## 2011-06-29 ENCOUNTER — Encounter (HOSPITAL_BASED_OUTPATIENT_CLINIC_OR_DEPARTMENT_OTHER): Payer: Managed Care, Other (non HMO)

## 2011-06-29 VITALS — BP 144/80 | HR 67 | Temp 98.0°F | Wt 220.0 lb

## 2011-06-29 DIAGNOSIS — C7800 Secondary malignant neoplasm of unspecified lung: Secondary | ICD-10-CM

## 2011-06-29 DIAGNOSIS — C19 Malignant neoplasm of rectosigmoid junction: Secondary | ICD-10-CM

## 2011-06-29 DIAGNOSIS — Z5112 Encounter for antineoplastic immunotherapy: Secondary | ICD-10-CM

## 2011-06-29 DIAGNOSIS — C20 Malignant neoplasm of rectum: Secondary | ICD-10-CM

## 2011-06-29 MED ORDER — CETUXIMAB CHEMO IV INJECTION 200 MG/100ML
250.0000 mg/m2 | Freq: Once | INTRAVENOUS | Status: AC
  Administered 2011-06-29: 600 mg via INTRAVENOUS
  Filled 2011-06-29: qty 300

## 2011-06-29 MED ORDER — HEPARIN SOD (PORK) LOCK FLUSH 100 UNIT/ML IV SOLN
500.0000 [IU] | Freq: Once | INTRAVENOUS | Status: AC
  Administered 2011-06-29: 500 [IU] via INTRAVENOUS
  Filled 2011-06-29: qty 5

## 2011-06-29 MED ORDER — DIPHENHYDRAMINE HCL 50 MG/ML IJ SOLN
INTRAMUSCULAR | Status: AC
  Filled 2011-06-29: qty 1

## 2011-06-29 MED ORDER — SODIUM CHLORIDE 0.9 % IV SOLN
Freq: Once | INTRAVENOUS | Status: AC
  Administered 2011-06-29: 09:00:00 via INTRAVENOUS

## 2011-06-29 MED ORDER — HEPARIN SOD (PORK) LOCK FLUSH 100 UNIT/ML IV SOLN
INTRAVENOUS | Status: AC
  Filled 2011-06-29: qty 5

## 2011-06-29 MED ORDER — HEPARIN SOD (PORK) LOCK FLUSH 100 UNIT/ML IV SOLN
500.0000 [IU] | Freq: Once | INTRAVENOUS | Status: DC | PRN
  Filled 2011-06-29: qty 5

## 2011-06-29 MED ORDER — DIPHENHYDRAMINE HCL 50 MG/ML IJ SOLN
50.0000 mg | Freq: Once | INTRAMUSCULAR | Status: AC
Start: 2011-06-29 — End: 2011-06-29
  Administered 2011-06-29: 50 mg via INTRAVENOUS

## 2011-07-01 ENCOUNTER — Other Ambulatory Visit (HOSPITAL_COMMUNITY): Payer: Self-pay | Admitting: Oncology

## 2011-07-05 ENCOUNTER — Encounter (HOSPITAL_BASED_OUTPATIENT_CLINIC_OR_DEPARTMENT_OTHER): Payer: Managed Care, Other (non HMO)

## 2011-07-05 DIAGNOSIS — C20 Malignant neoplasm of rectum: Secondary | ICD-10-CM

## 2011-07-05 LAB — CBC
HCT: 41.6 % (ref 39.0–52.0)
MCHC: 33.2 g/dL (ref 30.0–36.0)
MCV: 93.3 fL (ref 78.0–100.0)
RDW: 18.1 % — ABNORMAL HIGH (ref 11.5–15.5)

## 2011-07-05 LAB — DIFFERENTIAL
Basophils Absolute: 0.1 10*3/uL (ref 0.0–0.1)
Basophils Relative: 1 % (ref 0–1)
Eosinophils Relative: 3 % (ref 0–5)
Monocytes Absolute: 0.7 10*3/uL (ref 0.1–1.0)
Neutro Abs: 6.6 10*3/uL (ref 1.7–7.7)

## 2011-07-05 NOTE — Progress Notes (Signed)
Martin Maldonado presented for labwork. Labs per MD order drawn via Peripheral Line 25 gauge needle inserted in rtAC  Good blood return present. Procedure without incident.  Needle removed intact. Patient tolerated procedure well.

## 2011-07-06 ENCOUNTER — Encounter (HOSPITAL_BASED_OUTPATIENT_CLINIC_OR_DEPARTMENT_OTHER): Payer: Managed Care, Other (non HMO)

## 2011-07-06 DIAGNOSIS — C7800 Secondary malignant neoplasm of unspecified lung: Secondary | ICD-10-CM

## 2011-07-06 DIAGNOSIS — Z5112 Encounter for antineoplastic immunotherapy: Secondary | ICD-10-CM

## 2011-07-06 DIAGNOSIS — C20 Malignant neoplasm of rectum: Secondary | ICD-10-CM

## 2011-07-06 DIAGNOSIS — Z5111 Encounter for antineoplastic chemotherapy: Secondary | ICD-10-CM

## 2011-07-06 MED ORDER — HEPARIN SOD (PORK) LOCK FLUSH 100 UNIT/ML IV SOLN
500.0000 [IU] | Freq: Once | INTRAVENOUS | Status: DC | PRN
  Filled 2011-07-06: qty 5

## 2011-07-06 MED ORDER — SODIUM CHLORIDE 0.9 % IJ SOLN
10.0000 mL | INTRAMUSCULAR | Status: DC | PRN
  Filled 2011-07-06: qty 10

## 2011-07-06 MED ORDER — SODIUM CHLORIDE 0.9 % IV SOLN: 8.0000 mg | Freq: Once | INTRAVENOUS | Status: DC

## 2011-07-06 MED ORDER — FLUOROURACIL CHEMO INJECTION 2.5 GM/50ML
400.0000 mg/m2 | Freq: Once | INTRAVENOUS | Status: AC
  Administered 2011-07-06: 900 mg via INTRAVENOUS
  Filled 2011-07-06: qty 18

## 2011-07-06 MED ORDER — LEUCOVORIN CALCIUM INJECTION 350 MG
20.0000 mg/m2 | Freq: Once | INTRAMUSCULAR | Status: AC
  Administered 2011-07-06: 44 mg via INTRAVENOUS
  Filled 2011-07-06: qty 2.2

## 2011-07-06 MED ORDER — SODIUM CHLORIDE 0.9 % IV SOLN
Freq: Once | INTRAVENOUS | Status: AC
  Administered 2011-07-06: 8 mg via INTRAVENOUS
  Filled 2011-07-06: qty 4

## 2011-07-06 MED ORDER — CETUXIMAB CHEMO IV INJECTION 200 MG/100ML
250.0000 mg/m2 | Freq: Once | INTRAVENOUS | Status: AC
  Administered 2011-07-06: 600 mg via INTRAVENOUS
  Filled 2011-07-06: qty 300

## 2011-07-06 MED ORDER — FLUOROURACIL CHEMO INJECTION 5 GM/100ML
2400.0000 mg/m2 | INTRAVENOUS | Status: DC
  Administered 2011-07-06: 5350 mg via INTRAVENOUS
  Filled 2011-07-06 (×2): qty 107

## 2011-07-06 MED ORDER — DIPHENHYDRAMINE HCL 50 MG/ML IJ SOLN
50.0000 mg | Freq: Once | INTRAMUSCULAR | Status: AC
  Administered 2011-07-06: 50 mg via INTRAVENOUS

## 2011-07-06 MED ORDER — DEXAMETHASONE SODIUM PHOSPHATE 10 MG/ML IJ SOLN: 10.0000 mg | Freq: Once | INTRAMUSCULAR | Status: DC

## 2011-07-06 MED ORDER — SODIUM CHLORIDE 0.9 % IV SOLN
INTRAVENOUS | Status: DC
  Administered 2011-07-06: 11:00:00 via INTRAVENOUS

## 2011-07-06 MED ORDER — DIPHENHYDRAMINE HCL 50 MG/ML IJ SOLN
INTRAMUSCULAR | Status: AC
  Filled 2011-07-06: qty 1

## 2011-07-08 ENCOUNTER — Other Ambulatory Visit (HOSPITAL_COMMUNITY): Payer: Self-pay | Admitting: Oncology

## 2011-07-08 ENCOUNTER — Encounter (HOSPITAL_BASED_OUTPATIENT_CLINIC_OR_DEPARTMENT_OTHER): Payer: Managed Care, Other (non HMO) | Admitting: Oncology

## 2011-07-08 ENCOUNTER — Encounter (HOSPITAL_COMMUNITY): Payer: Managed Care, Other (non HMO)

## 2011-07-08 VITALS — BP 154/83 | HR 67 | Temp 98.4°F | Wt 221.0 lb

## 2011-07-08 DIAGNOSIS — C20 Malignant neoplasm of rectum: Secondary | ICD-10-CM

## 2011-07-08 DIAGNOSIS — G8929 Other chronic pain: Secondary | ICD-10-CM

## 2011-07-08 DIAGNOSIS — C7800 Secondary malignant neoplasm of unspecified lung: Secondary | ICD-10-CM

## 2011-07-08 DIAGNOSIS — R21 Rash and other nonspecific skin eruption: Secondary | ICD-10-CM

## 2011-07-08 MED ORDER — HEPARIN SOD (PORK) LOCK FLUSH 100 UNIT/ML IV SOLN
500.0000 [IU] | Freq: Once | INTRAVENOUS | Status: AC | PRN
  Administered 2011-07-08: 500 [IU]
  Filled 2011-07-08: qty 5

## 2011-07-08 MED ORDER — SODIUM CHLORIDE 0.9 % IJ SOLN
10.0000 mL | INTRAMUSCULAR | Status: DC | PRN
  Administered 2011-07-08: 10 mL
  Filled 2011-07-08: qty 10

## 2011-07-08 MED ORDER — SODIUM CHLORIDE 0.9 % IJ SOLN
INTRAMUSCULAR | Status: AC
  Filled 2011-07-08: qty 10

## 2011-07-08 MED ORDER — HEPARIN SOD (PORK) LOCK FLUSH 100 UNIT/ML IV SOLN
INTRAVENOUS | Status: AC
  Filled 2011-07-08: qty 5

## 2011-07-08 NOTE — Progress Notes (Signed)
Kirstie Peri, MD, MD [address]  [place] [address]  1. Rectal cancer  CT Abdomen Pelvis W Contrast, CT Chest W Contrast    CURRENT THERAPY:S/P cycle 5 of 5-FU and Leucovorin and S/P cycle 2 of weekly Cetuximab.    INTERVAL HISTORY: Martin Maldonado 56 y.o. 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  The patient reports that his is doing well.  He notes that this chemotherapy regimen is well tolerated except for the rash.  He is utilizing the clindagel and Aveeno which are beneficial for him.   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    We discussed his upcoming CT scans.  These will be performed after cycle 6 of chemotherapy administration.   ROS: No TIA's or unusual headaches, no dysphagia.  No prolonged cough. No dyspnea or chest pain on exertion.  No abdominal pain, change in bowel habits, black or bloody stools.  No urinary tract symptoms.  No new or unusual musculoskeletal symptoms.   Past Medical History  Diagnosis Date  . Hypertension   . Rectal cancer 2008    Adenocarcinoma; AP resection in 2008; recurrence with lung metastases in 2011  . Arteriosclerotic cardiovascular disease (ASCVD) 2011    CABG surgery in 2011  . Hyperlipidemia   . 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 Splenomegaly 01/23/2009    Additional GI history includes small bowel obstruction, requiring laparotomy and lysis of adhesions in 2009, remote peptic ulcer disease and postoperative perirectal infection  . ALCOHOL ABUSE, HX OF 01/23/2009    has HYPERLIPIDEMIA; Tobacco abuse, in remission; HYPERTENSION; COPD; SPLENOMEGALY; SMALL BOWEL OBSTRUCTION, HX OF; and Rectal cancer on his problem list.      has no known allergies.  Martin Maldonado had no medications administered during this visit.  Past Surgical History  Procedure Date  . Irrigation and  debridement sebaceous cyst   . Laparoscopic lysis intestinal adhesions 2009    With incidental appendectomy  . Coronary artery bypass graft 2011    2 vessel  . Abdominoperineal proctocolectomy 2008  . Portacath placement 2011    Denies any headaches, dizziness, double vision, fevers, chills, night sweats, nausea, vomiting, diarrhea, constipation, chest pain, heart palpitations, shortness of breath, blood in stool, black tarry stool, urinary pain, urinary burning, urinary frequency, hematuria.   PHYSICAL EXAMINATION  ECOG PERFORMANCE STATUS: 1 - Symptomatic but completely ambulatory  Filed Vitals:   07/08/11 1318  BP: 154/83  Pulse: 67  Temp: 98.4 F (36.9 C)    GENERAL:alert, no distress, well nourished, well developed, comfortable, cooperative and smiling SKIN: skin color, texture, turgor are normal, positive VWU:[id number] erythematous facial and chest rash secondary to EGFR antibody. HEAD: Normocephalic, No masses, lesions, tenderness or abnormalities EYES: normal, Conjunctiva are pink and non-injected EARS: External ears normal OROPHARYNX:lips, buccal mucosa, and tongue normal and mucous membranes are moist  NECK: supple, trachea midline LYMPH:  not examined BREAST:not examined LUNGS: clear to auscultation and percussion HEART: regular rate & rhythm, no murmurs, no gallops, S1 normal and S2 normal ABDOMEN:abdomen soft, non-tender, normal bowel sounds and colostomy noted producing soft stool BACK: Back symmetric, no curvature. EXTREMITIES:less then 2 second capillary refill, no joint deformities, effusion, or inflammation, no edema, no skin discoloration, no clubbing, no cyanosis  NEURO: alert & oriented x 3 with fluent speech, no focal motor/sensory deficits,  gait normal   LABORATORY DATA: CBC    Component Value Date/Time   WBC 9.7 07/05/2011 1139   RBC 4.46 07/05/2011 1139   HGB 13.8 07/05/2011 1139   HCT 41.6 07/05/2011 1139   PLT 188 07/05/2011 1139   MCV 93.3  07/05/2011 1139   MCH 30.9 07/05/2011 1139   MCHC 33.2 07/05/2011 1139   RDW 18.1* 07/05/2011 1139   LYMPHSABS 2.0 07/05/2011 1139   MONOABS 0.7 07/05/2011 1139   EOSABS 0.3 07/05/2011 1139   BASOSABS 0.1 07/05/2011 1139      Chemistry      Component Value Date/Time   NA 138 06/07/2011 1132   K 4.2 06/07/2011 1132   CL 102 06/07/2011 1132   CO2 26 06/07/2011 1132   BUN 24* 06/07/2011 1132   CREATININE 1.60* 06/07/2011 1132      Component Value Date/Time   CALCIUM 9.7 06/07/2011 1132   ALKPHOS 109 06/07/2011 1132   AST 24 06/07/2011 1132   ALT 38 06/07/2011 1132   BILITOT 0.4 06/07/2011 1132      Results for Stieber, [place] L (MRN [id number]) as of 07/08/2011 14:53  Ref. Range 01/15/2011 11:18 04/12/2011 09:27 06/21/2011 09:50  CEA Latest Range: 0.0-5.0 ng/mL 4.3 5.8 (H) 9.1 (H)      ASSESSMENT:  1. 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 2. Rash secondary to Cetuximab, improved  3. CABG on 06/09/2009.  4. Chronic obstructive pulmonary disease from smoking x many years, though he has quit.  5. Small bowel obstruction requiring surgery in February 2009.  6. Rectal cancer surgery in February 2008 with 1 of 12 positive nodes with LVI and a 4 cm primary, status post 6 cycles of adjuvant oxaliplatin, capecitabine and Avastin since there was concerned he had postoperative disease left behind in the pelvis, and he also received postoperative radiation therapy with concomitant capecitabine at that time.  7. Chronic back pain.    PLAN:  1. I refilled his clindagel for his rash. 2. Will perform Restaging CT scans following cycle 6 of chemotherapy administration. 3. Patient reports that his present chemotherapy regimen is very well tolerated for him and he will be willing to continue with this therapy if his CT scans reveal improvement.  4. Pre-chemo lab work ordered.  CEA on 07/19/2011 with pre-chemo lab work. 5. Continue with  chemotherapy as scheduled.  6. Return following CT scans to review results.    All questions were answered. The patient knows to call the clinic with any problems, questions or concerns. We can certainly see the patient much sooner if necessary.  Foday Cone

## 2011-07-09 NOTE — Progress Notes (Signed)
Labs drawn

## 2011-07-12 ENCOUNTER — Encounter (HOSPITAL_BASED_OUTPATIENT_CLINIC_OR_DEPARTMENT_OTHER): Payer: Managed Care, Other (non HMO)

## 2011-07-12 DIAGNOSIS — C20 Malignant neoplasm of rectum: Secondary | ICD-10-CM

## 2011-07-12 LAB — CBC
HCT: 40.8 % (ref 39.0–52.0)
Hemoglobin: 13.4 g/dL (ref 13.0–17.0)
MCHC: 32.8 g/dL (ref 30.0–36.0)
MCV: 93.8 fL (ref 78.0–100.0)
RDW: 17.7 % — ABNORMAL HIGH (ref 11.5–15.5)

## 2011-07-12 LAB — COMPREHENSIVE METABOLIC PANEL
ALT: 25 U/L (ref 0–53)
AST: 15 U/L (ref 0–37)
Alkaline Phosphatase: 91 U/L (ref 39–117)
CO2: 25 mEq/L (ref 19–32)
Calcium: 9 mg/dL (ref 8.4–10.5)
GFR calc non Af Amer: 49 mL/min — ABNORMAL LOW (ref >90)
Glucose, Bld: 92 mg/dL (ref 70–99)
Potassium: 3.6 mEq/L (ref 3.5–5.1)
Sodium: 140 mEq/L (ref 135–145)
Total Protein: 6.5 g/dL (ref 6.0–8.3)

## 2011-07-12 LAB — DIFFERENTIAL
Basophils Absolute: 0 K/uL (ref 0.0–0.1)
Basophils Relative: 0 % (ref 0–1)
Eosinophils Absolute: 0.2 K/uL (ref 0.0–0.7)
Eosinophils Relative: 3 % (ref 0–5)
Lymphocytes Relative: 30 % (ref 12–46)
Lymphs Abs: 2.1 K/uL (ref 0.7–4.0)
Monocytes Absolute: 0.4 K/uL (ref 0.1–1.0)
Monocytes Relative: 6 % (ref 3–12)
Neutro Abs: 4.2 K/uL (ref 1.7–7.7)
Neutrophils Relative %: 60 % (ref 43–77)

## 2011-07-12 NOTE — Progress Notes (Signed)
Labs drawn today for cbc/diff,cmp 

## 2011-07-12 NOTE — Progress Notes (Signed)
Labs drawn

## 2011-07-13 ENCOUNTER — Encounter (HOSPITAL_BASED_OUTPATIENT_CLINIC_OR_DEPARTMENT_OTHER): Payer: Managed Care, Other (non HMO)

## 2011-07-13 VITALS — BP 162/80 | HR 54 | Temp 98.2°F | Wt 218.2 lb

## 2011-07-13 DIAGNOSIS — Z5112 Encounter for antineoplastic immunotherapy: Secondary | ICD-10-CM

## 2011-07-13 DIAGNOSIS — C19 Malignant neoplasm of rectosigmoid junction: Secondary | ICD-10-CM

## 2011-07-13 DIAGNOSIS — C20 Malignant neoplasm of rectum: Secondary | ICD-10-CM

## 2011-07-13 DIAGNOSIS — C7800 Secondary malignant neoplasm of unspecified lung: Secondary | ICD-10-CM

## 2011-07-13 MED ORDER — HEPARIN SOD (PORK) LOCK FLUSH 100 UNIT/ML IV SOLN
500.0000 [IU] | Freq: Once | INTRAVENOUS | Status: AC | PRN
  Administered 2011-07-13: 500 [IU]
  Filled 2011-07-13: qty 5

## 2011-07-13 MED ORDER — DIPHENHYDRAMINE HCL 50 MG/ML IJ SOLN
INTRAMUSCULAR | Status: AC
  Filled 2011-07-13: qty 1

## 2011-07-13 MED ORDER — HEPARIN SOD (PORK) LOCK FLUSH 100 UNIT/ML IV SOLN
INTRAVENOUS | Status: AC
  Filled 2011-07-13: qty 5

## 2011-07-13 MED ORDER — DIPHENHYDRAMINE HCL 50 MG/ML IJ SOLN
50.0000 mg | Freq: Once | INTRAMUSCULAR | Status: AC
  Administered 2011-07-13: 50 mg via INTRAVENOUS

## 2011-07-13 MED ORDER — CETUXIMAB CHEMO IV INJECTION 200 MG/100ML
250.0000 mg/m2 | Freq: Once | INTRAVENOUS | Status: AC
  Administered 2011-07-13: 600 mg via INTRAVENOUS
  Filled 2011-07-13: qty 300

## 2011-07-13 MED ORDER — SODIUM CHLORIDE 0.9 % IJ SOLN
10.0000 mL | INTRAMUSCULAR | Status: DC | PRN
  Administered 2011-07-13: 10 mL
  Filled 2011-07-13: qty 10

## 2011-07-13 MED ORDER — SODIUM CHLORIDE 0.9 % IV SOLN
Freq: Once | INTRAVENOUS | Status: AC
  Administered 2011-07-13: 10:00:00 via INTRAVENOUS

## 2011-07-13 MED ORDER — SODIUM CHLORIDE 0.9 % IJ SOLN
INTRAMUSCULAR | Status: AC
  Filled 2011-07-13: qty 10

## 2011-07-13 NOTE — Progress Notes (Signed)
Tolerated erbitux infusion well. 

## 2011-07-19 ENCOUNTER — Encounter (HOSPITAL_BASED_OUTPATIENT_CLINIC_OR_DEPARTMENT_OTHER): Payer: Managed Care, Other (non HMO)

## 2011-07-19 DIAGNOSIS — C20 Malignant neoplasm of rectum: Secondary | ICD-10-CM

## 2011-07-19 LAB — CBC
HCT: 43 % (ref 39.0–52.0)
MCH: 31.5 pg (ref 26.0–34.0)
MCHC: 33.5 g/dL (ref 30.0–36.0)
MCV: 94.1 fL (ref 78.0–100.0)
RDW: 18.5 % — ABNORMAL HIGH (ref 11.5–15.5)

## 2011-07-19 LAB — DIFFERENTIAL
Basophils Absolute: 0.1 10*3/uL (ref 0.0–0.1)
Basophils Relative: 1 % (ref 0–1)
Eosinophils Absolute: 0.4 10*3/uL (ref 0.0–0.7)
Eosinophils Relative: 4 % (ref 0–5)
Monocytes Absolute: 0.6 10*3/uL (ref 0.1–1.0)

## 2011-07-19 NOTE — Progress Notes (Signed)
Lab draw

## 2011-07-20 ENCOUNTER — Encounter (HOSPITAL_BASED_OUTPATIENT_CLINIC_OR_DEPARTMENT_OTHER): Payer: Managed Care, Other (non HMO)

## 2011-07-20 VITALS — BP 128/82 | HR 60 | Temp 97.6°F | Wt 217.0 lb

## 2011-07-20 DIAGNOSIS — C20 Malignant neoplasm of rectum: Secondary | ICD-10-CM

## 2011-07-20 DIAGNOSIS — Z5111 Encounter for antineoplastic chemotherapy: Secondary | ICD-10-CM

## 2011-07-20 DIAGNOSIS — C7800 Secondary malignant neoplasm of unspecified lung: Secondary | ICD-10-CM

## 2011-07-20 MED ORDER — SODIUM CHLORIDE 0.9 % IV SOLN
2400.0000 mg/m2 | INTRAVENOUS | Status: DC
  Administered 2011-07-20: 5350 mg via INTRAVENOUS
  Filled 2011-07-20 (×2): qty 107

## 2011-07-20 MED ORDER — SODIUM CHLORIDE 0.9 % IJ SOLN
10.0000 mL | INTRAMUSCULAR | Status: DC | PRN
  Administered 2011-07-20: 10 mL
  Filled 2011-07-20: qty 10

## 2011-07-20 MED ORDER — SODIUM CHLORIDE 0.9 % IJ SOLN
10.0000 mL | INTRAMUSCULAR | Status: DC | PRN
  Filled 2011-07-20: qty 10

## 2011-07-20 MED ORDER — FLUOROURACIL CHEMO INJECTION 2.5 GM/50ML
400.0000 mg/m2 | Freq: Once | INTRAVENOUS | Status: AC
  Administered 2011-07-20: 900 mg via INTRAVENOUS
  Filled 2011-07-20: qty 18

## 2011-07-20 MED ORDER — CETUXIMAB CHEMO IV INJECTION 200 MG/100ML
250.0000 mg/m2 | Freq: Once | INTRAVENOUS | Status: AC
  Administered 2011-07-20: 600 mg via INTRAVENOUS
  Filled 2011-07-20: qty 300

## 2011-07-20 MED ORDER — SODIUM CHLORIDE 0.9 % IV SOLN
INTRAVENOUS | Status: DC
  Administered 2011-07-20: 09:00:00 via INTRAVENOUS

## 2011-07-20 MED ORDER — DEXAMETHASONE SODIUM PHOSPHATE 10 MG/ML IJ SOLN: 10.0000 mg | Freq: Once | INTRAMUSCULAR | Status: DC

## 2011-07-20 MED ORDER — SODIUM CHLORIDE 0.9 % IV SOLN
Freq: Once | INTRAVENOUS | Status: AC
  Administered 2011-07-20: 8 mg via INTRAVENOUS
  Filled 2011-07-20: qty 4

## 2011-07-20 MED ORDER — LEUCOVORIN CALCIUM INJECTION 350 MG: 400.0000 mg/m2 | Freq: Once | INTRAMUSCULAR | Status: DC

## 2011-07-20 MED ORDER — HEPARIN SOD (PORK) LOCK FLUSH 100 UNIT/ML IV SOLN
500.0000 [IU] | Freq: Once | INTRAVENOUS | Status: DC | PRN
  Filled 2011-07-20: qty 5

## 2011-07-20 MED ORDER — LEUCOVORIN CALCIUM INJECTION 100 MG
44.0000 mg | Freq: Once | INTRAMUSCULAR | Status: AC
  Administered 2011-07-20: 44 mg via INTRAVENOUS
  Filled 2011-07-20: qty 2.2

## 2011-07-20 MED ORDER — SODIUM CHLORIDE 0.9 % IV SOLN: 8.0000 mg | Freq: Once | INTRAVENOUS | Status: DC

## 2011-07-22 ENCOUNTER — Encounter (HOSPITAL_COMMUNITY): Payer: Managed Care, Other (non HMO) | Attending: Oncology

## 2011-07-22 DIAGNOSIS — C7800 Secondary malignant neoplasm of unspecified lung: Secondary | ICD-10-CM

## 2011-07-22 DIAGNOSIS — C20 Malignant neoplasm of rectum: Secondary | ICD-10-CM | POA: Insufficient documentation

## 2011-07-22 DIAGNOSIS — Z452 Encounter for adjustment and management of vascular access device: Secondary | ICD-10-CM

## 2011-07-22 MED ORDER — SODIUM CHLORIDE 0.9 % IJ SOLN
INTRAMUSCULAR | Status: AC
  Filled 2011-07-22: qty 10

## 2011-07-22 MED ORDER — HEPARIN SOD (PORK) LOCK FLUSH 100 UNIT/ML IV SOLN
500.0000 [IU] | Freq: Once | INTRAVENOUS | Status: AC | PRN
  Administered 2011-07-22: 500 [IU]
  Filled 2011-07-22: qty 5

## 2011-07-22 MED ORDER — HEPARIN SOD (PORK) LOCK FLUSH 100 UNIT/ML IV SOLN
INTRAVENOUS | Status: AC
  Filled 2011-07-22: qty 5

## 2011-07-22 MED ORDER — SODIUM CHLORIDE 0.9 % IJ SOLN
10.0000 mL | INTRAMUSCULAR | Status: DC | PRN
  Administered 2011-07-22: 10 mL
  Filled 2011-07-22: qty 10

## 2011-07-26 ENCOUNTER — Ambulatory Visit (HOSPITAL_COMMUNITY)
Admission: RE | Admit: 2011-07-26 | Discharge: 2011-07-26 | Disposition: A | Discharge status: Home / Self Care | Payer: Managed Care, Other (non HMO) | Source: Ambulatory Visit | Attending: Oncology | Admitting: Oncology

## 2011-07-26 ENCOUNTER — Other Ambulatory Visit (HOSPITAL_COMMUNITY): Payer: Managed Care, Other (non HMO)

## 2011-07-26 DIAGNOSIS — C785 Secondary malignant neoplasm of large intestine and rectum: Secondary | ICD-10-CM | POA: Insufficient documentation

## 2011-07-26 DIAGNOSIS — Z933 Colostomy status: Secondary | ICD-10-CM | POA: Insufficient documentation

## 2011-07-26 DIAGNOSIS — C7800 Secondary malignant neoplasm of unspecified lung: Secondary | ICD-10-CM | POA: Insufficient documentation

## 2011-07-26 DIAGNOSIS — C20 Malignant neoplasm of rectum: Secondary | ICD-10-CM | POA: Insufficient documentation

## 2011-07-26 DIAGNOSIS — K8020 Calculus of gallbladder without cholecystitis without obstruction: Secondary | ICD-10-CM | POA: Insufficient documentation

## 2011-07-26 MED ORDER — IOHEXOL 300 MG/ML  SOLN
100.0000 mL | Freq: Once | INTRAMUSCULAR | Status: AC | PRN
  Administered 2011-07-26: 100 mL via INTRAVENOUS

## 2011-07-27 ENCOUNTER — Encounter (HOSPITAL_BASED_OUTPATIENT_CLINIC_OR_DEPARTMENT_OTHER): Payer: Managed Care, Other (non HMO)

## 2011-07-27 ENCOUNTER — Encounter (HOSPITAL_BASED_OUTPATIENT_CLINIC_OR_DEPARTMENT_OTHER): Payer: Managed Care, Other (non HMO) | Admitting: Oncology

## 2011-07-27 VITALS — BP 125/73 | HR 53 | Temp 97.0°F | Ht 71.0 in | Wt 218.0 lb

## 2011-07-27 DIAGNOSIS — Z5112 Encounter for antineoplastic immunotherapy: Secondary | ICD-10-CM

## 2011-07-27 DIAGNOSIS — G8929 Other chronic pain: Secondary | ICD-10-CM

## 2011-07-27 DIAGNOSIS — C7800 Secondary malignant neoplasm of unspecified lung: Secondary | ICD-10-CM

## 2011-07-27 DIAGNOSIS — C20 Malignant neoplasm of rectum: Secondary | ICD-10-CM

## 2011-07-27 DIAGNOSIS — C19 Malignant neoplasm of rectosigmoid junction: Secondary | ICD-10-CM

## 2011-07-27 DIAGNOSIS — R21 Rash and other nonspecific skin eruption: Secondary | ICD-10-CM

## 2011-07-27 MED ORDER — HEPARIN SOD (PORK) LOCK FLUSH 100 UNIT/ML IV SOLN
INTRAVENOUS | Status: AC
  Filled 2011-07-27: qty 5

## 2011-07-27 MED ORDER — HEPARIN SOD (PORK) LOCK FLUSH 100 UNIT/ML IV SOLN
500.0000 [IU] | Freq: Once | INTRAVENOUS | Status: DC | PRN
  Filled 2011-07-27: qty 5

## 2011-07-27 MED ORDER — SODIUM CHLORIDE 0.9 % IJ SOLN
INTRAMUSCULAR | Status: AC
  Filled 2011-07-27: qty 10

## 2011-07-27 MED ORDER — DIPHENHYDRAMINE HCL 50 MG/ML IJ SOLN
50.0000 mg | Freq: Once | INTRAMUSCULAR | Status: AC
  Administered 2011-07-27: 50 mg via INTRAVENOUS

## 2011-07-27 MED ORDER — SODIUM CHLORIDE 0.9 % IV SOLN
Freq: Once | INTRAVENOUS | Status: AC
  Administered 2011-07-27: 09:00:00 via INTRAVENOUS

## 2011-07-27 MED ORDER — CETUXIMAB CHEMO IV INJECTION 200 MG/100ML
250.0000 mg/m2 | Freq: Once | INTRAVENOUS | Status: AC
  Administered 2011-07-27: 600 mg via INTRAVENOUS
  Filled 2011-07-27: qty 300

## 2011-07-27 MED ORDER — DIPHENHYDRAMINE HCL 50 MG/ML IJ SOLN
INTRAMUSCULAR | Status: AC
  Filled 2011-07-27: qty 1

## 2011-07-27 MED ORDER — SODIUM CHLORIDE 0.9 % IJ SOLN
10.0000 mL | INTRAMUSCULAR | Status: DC | PRN
  Administered 2011-07-27: 10 mL
  Filled 2011-07-27: qty 10

## 2011-07-27 NOTE — Progress Notes (Signed)
Martin Peri, MD, MD [address]  [place] [address]  1. Rectal cancer     CURRENT THERAPY: S/P cycle 6 of 5-FU and Leucovorin and S/P cycle 3 of weekly Cetuximab.    INTERVAL HISTORY: Martin Maldonado 56 y.o. 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   Martin Maldonado is not only for antibody therapy, but also to discuss his recent restaging CT scans.  These were performed the other day. I personally reviewed and went over radiographic studies with the patient.  His lung metastases have decreased in size ranging from 50% to 20% in decreased size.  Of course he is pleased with this information.  The patient denies any complaints.  He again re-affirms that this chemotherapy is easy for him.    In light of the restaging CT scans, we will continue with chemotherapy as scheduled.  He will return in one month for follow-up.   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  His performance is excellent.   Past Medical History  Diagnosis Date  . Hypertension   . Rectal cancer 2008    Adenocarcinoma; AP resection in 2008; recurrence with lung metastases in 2011  . Arteriosclerotic cardiovascular disease (ASCVD) 2011    CABG surgery in 2011  . Hyperlipidemia   . 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 Splenomegaly 01/23/2009    Additional GI history includes small bowel obstruction, requiring laparotomy and lysis of adhesions in 2009, remote peptic ulcer disease and postoperative perirectal infection  . ALCOHOL ABUSE, HX OF 01/23/2009    has HYPERLIPIDEMIA; Tobacco abuse, in remission; HYPERTENSION; COPD; SPLENOMEGALY; SMALL BOWEL OBSTRUCTION, HX OF; and Rectal cancer on his problem list.      has no known allergies.  Martin Maldonado had no medications administered during this visit.  Past Surgical History  Procedure Date  . Irrigation and debridement sebaceous cyst   . Laparoscopic lysis  intestinal adhesions 2009    With incidental appendectomy  . Coronary artery bypass graft 2011    2 vessel  . Abdominoperineal proctocolectomy 2008  . Portacath placement 2011    Denies any headaches, dizziness, double vision, fevers, chills, night sweats, nausea, vomiting, diarrhea, constipation, chest pain, heart palpitations, shortness of breath, blood in stool, black tarry stool, urinary pain, urinary burning, urinary frequency, hematuria.   PHYSICAL EXAMINATION  ECOG PERFORMANCE STATUS: 1 - Symptomatic but completely ambulatory  There were no vitals filed for this visit.  GENERAL:alert, no distress, well nourished, well developed, comfortable, cooperative and smiling SKIN: skin color, texture, turgor are normal, no rashes or significant lesions, face is erythematous. HEAD: Normocephalic, No masses, lesions, tenderness or abnormalities EYES: normal, EOMI, Conjunctiva are pink and non-injected EARS: External ears normal OROPHARYNX:lips, buccal mucosa, and tongue normal and mucous membranes are moist  NECK: supple, trachea midline LYMPH:  not examined BREAST:not examined LUNGS: clear to auscultation  HEART: regular rate & rhythm, no murmurs, no gallops, S1 normal and S2 normal ABDOMEN:abdomen soft, non-tender and normal bowel sounds BACK: Back symmetric, no curvature., No CVA tenderness EXTREMITIES:less then 2 second capillary refill, no joint deformities, effusion, or inflammation, no edema, no skin discoloration, no clubbing, no cyanosis  NEURO: alert & oriented x 3 with fluent speech, no focal motor/sensory deficits, gait normal   LABORATORY DATA: CBC    Component Value Date/Time   WBC 9.4 07/19/2011 1028   RBC 4.57 07/19/2011  1028   HGB 14.4 07/19/2011 1028   HCT 43.0 07/19/2011 1028   PLT 201 07/19/2011 1028   MCV 94.1 07/19/2011 1028   MCH 31.5 07/19/2011 1028   MCHC 33.5 07/19/2011 1028   RDW 18.5* 07/19/2011 1028   LYMPHSABS 1.9 07/19/2011 1028   MONOABS 0.6 07/19/2011  1028   EOSABS 0.4 07/19/2011 1028   BASOSABS 0.1 07/19/2011 1028     RADIOGRAPHIC STUDIES:  Ct Chest W Contrast  07/26/2011  *RADIOLOGY REPORT*  Clinical Data:  Metastatic colorectal cancer, post colostomy in 2008, post chemotherapy, restaging; history hypertension, smoking  CT CHEST, ABDOMEN AND PELVIS WITH CONTRAST  Technique:  Multidetector CT imaging of the chest, abdomen and pelvis was performed following the standard protocol during bolus administration of intravenous contrast.  Sagittal and coronal MPR images reconstructed from axial data set.  Contrast: OMNIPAQUE IOHEXOL 300 MG/ML  SOLN Dilute oral contrast.  Comparison:  04/20/2011  CT CHEST  Findings: Scattered atherosclerotic calcifications aorta and coronary arteries post CABG. No definite thoracic adenopathy. Right subclavian Port-A-Cath, tip SVC, date and demonstrating a small amount of adjacent thrombus. Decreased left hilar mediastinal adenopathy. Multiple bilateral pulmonary masses identified compatible with metastatic disease. Observed metastases appear to have decreased in sizes since previous study. Index right lower lobe mass 2.1 x 1.5 cm image 35, previously 2.7 x 1.8 cm. Left lower lobe mass 2.2 x 1.2 cm image 45, previously 2.3 x 1.4 cm. Additional left lower lobe mass 1.9 x 1.0 cm image 47, previously 2.5 x 1.5 cm. No definite new pulmonary mass, nodule, infiltrate or effusion. Stable small lytic focus in the posterior right 8th rib. No new osseous abnormalities.  IMPRESSION: Multiple pulmonary metastases, majority demonstrating decrease is in sizes since previous study. No new intrathoracic abnormalities.  CT ABDOMEN AND PELVIS  Findings: 7 mm low attenuation focus right lobe liver image 65, previously 8 mm. Tiny nonspecific low attenuation focus right lobe liver image 66 unchanged. Questionable additional hepatic lesion versus volume averaging artifact at dome right lobe image 54. Remainder of liver, spleen, pancreas, kidneys,  and adrenal glands normal appearance. Cholelithiasis. Post resection of the sigmoid colon and rectum with sigmoid colostomy left lower quadrant. Small amount of parastomal herniation of fat at ostomy in left lower quadrant. Low lying cecum in pelvis. Bowel loops otherwise unremarkable. Surgical clips in pelvis from APR with chronic stranding of presacral space. Old anterior abdominal wall healed wound with a small left parasagittal ventral hernia containing fat. Scattered atherosclerotic calcifications aorta and iliac arteries. No mass, adenopathy, free fluid, or new inflammatory process. No new osseous abnormalities.  IMPRESSION: Postsurgical changes in pelvis. Stable tiny low attenuation foci within liver. Small left abdominal and parastomal hernias containing fat. Cholelithiasis. No new intra abdominal or intrapelvic abnormalities.  Original Report Authenticated By: Lollie Marrow, M.D.   Ct Abdomen Pelvis W Contrast  07/26/2011  *RADIOLOGY REPORT*  Clinical Data:  Metastatic colorectal cancer, post colostomy in 2008, post chemotherapy, restaging; history hypertension, smoking  CT CHEST, ABDOMEN AND PELVIS WITH CONTRAST  Technique:  Multidetector CT imaging of the chest, abdomen and pelvis was performed following the standard protocol during bolus administration of intravenous contrast.  Sagittal and coronal MPR images reconstructed from axial data set.  Contrast: OMNIPAQUE IOHEXOL 300 MG/ML  SOLN Dilute oral contrast.  Comparison:  04/20/2011  CT CHEST  Findings: Scattered atherosclerotic calcifications aorta and coronary arteries post CABG. No definite thoracic adenopathy. Right subclavian Port-A-Cath, tip SVC, date and demonstrating a small  amount of adjacent thrombus. Decreased left hilar mediastinal adenopathy. Multiple bilateral pulmonary masses identified compatible with metastatic disease. Observed metastases appear to have decreased in sizes since previous study. Index right lower lobe mass 2.1 x  1.5 cm image 35, previously 2.7 x 1.8 cm. Left lower lobe mass 2.2 x 1.2 cm image 45, previously 2.3 x 1.4 cm. Additional left lower lobe mass 1.9 x 1.0 cm image 47, previously 2.5 x 1.5 cm. No definite new pulmonary mass, nodule, infiltrate or effusion. Stable small lytic focus in the posterior right 8th rib. No new osseous abnormalities.  IMPRESSION: Multiple pulmonary metastases, majority demonstrating decrease is in sizes since previous study. No new intrathoracic abnormalities.  CT ABDOMEN AND PELVIS  Findings: 7 mm low attenuation focus right lobe liver image 65, previously 8 mm. Tiny nonspecific low attenuation focus right lobe liver image 66 unchanged. Questionable additional hepatic lesion versus volume averaging artifact at dome right lobe image 54. Remainder of liver, spleen, pancreas, kidneys, and adrenal glands normal appearance. Cholelithiasis. Post resection of the sigmoid colon and rectum with sigmoid colostomy left lower quadrant. Small amount of parastomal herniation of fat at ostomy in left lower quadrant. Low lying cecum in pelvis. Bowel loops otherwise unremarkable. Surgical clips in pelvis from APR with chronic stranding of presacral space. Old anterior abdominal wall healed wound with a small left parasagittal ventral hernia containing fat. Scattered atherosclerotic calcifications aorta and iliac arteries. No mass, adenopathy, free fluid, or new inflammatory process. No new osseous abnormalities.  IMPRESSION: Postsurgical changes in pelvis. Stable tiny low attenuation foci within liver. Small left abdominal and parastomal hernias containing fat. Cholelithiasis. No new intra abdominal or intrapelvic abnormalities.  Original Report Authenticated By: Lollie Marrow, M.D.      ASSESSMENT:  1. 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 2. Rash secondary to Cetuximab, improved  3. CABG on 06/09/2009.  4. Chronic obstructive  pulmonary disease from smoking x many years, though he has quit.  5. Small bowel obstruction requiring surgery in February 2009.  6. Rectal cancer surgery in February 2008 with 1 of 12 positive nodes with LVI and a 4 cm primary, status post 6 cycles of adjuvant oxaliplatin, capecitabine and Avastin since there was concerned he had postoperative disease left behind in the pelvis, and he also received postoperative radiation therapy with concomitant capecitabine at that time.  7. Chronic back pain.    PLAN:  1. I personally reviewed and went over radiographic studies with the patient. 2. Continue with chemotherapy as scheduled 3. Return in 1 month for follow-up.  All questions were answered. The patient knows to call the clinic with any problems, questions or concerns. We can certainly see the patient much sooner if necessary.  The patient and plan discussed with Glenford Peers, MD and he is in agreement with the aforementioned.  I spent 15 minutes counseling the patient face to face. The total time spent in the appointment was 20 minutes.  Verdon Ferrante

## 2011-08-02 ENCOUNTER — Encounter (HOSPITAL_COMMUNITY): Payer: Managed Care, Other (non HMO)

## 2011-08-02 DIAGNOSIS — C20 Malignant neoplasm of rectum: Secondary | ICD-10-CM

## 2011-08-02 LAB — DIFFERENTIAL
Basophils Relative: 1 % (ref 0–1)
Eosinophils Absolute: 0.2 10*3/uL (ref 0.0–0.7)
Eosinophils Relative: 2 % (ref 0–5)
Monocytes Absolute: 0.6 10*3/uL (ref 0.1–1.0)
Monocytes Relative: 8 % (ref 3–12)

## 2011-08-02 LAB — COMPREHENSIVE METABOLIC PANEL
ALT: 59 U/L — ABNORMAL HIGH (ref 0–53)
AST: 38 U/L — ABNORMAL HIGH (ref 0–37)
Calcium: 10.1 mg/dL (ref 8.4–10.5)
Potassium: 4.5 mEq/L (ref 3.5–5.1)
Sodium: 139 mEq/L (ref 135–145)
Total Protein: 7.1 g/dL (ref 6.0–8.3)

## 2011-08-02 LAB — CBC
HCT: 40.1 % (ref 39.0–52.0)
Hemoglobin: 13.6 g/dL (ref 13.0–17.0)
MCH: 32.2 pg (ref 26.0–34.0)
MCHC: 33.9 g/dL (ref 30.0–36.0)
MCV: 94.8 fL (ref 78.0–100.0)

## 2011-08-03 ENCOUNTER — Encounter (HOSPITAL_BASED_OUTPATIENT_CLINIC_OR_DEPARTMENT_OTHER): Payer: Managed Care, Other (non HMO)

## 2011-08-03 VITALS — BP 152/98 | HR 65 | Temp 97.6°F | Wt 219.0 lb

## 2011-08-03 DIAGNOSIS — C7800 Secondary malignant neoplasm of unspecified lung: Secondary | ICD-10-CM

## 2011-08-03 DIAGNOSIS — Z5111 Encounter for antineoplastic chemotherapy: Secondary | ICD-10-CM

## 2011-08-03 DIAGNOSIS — C20 Malignant neoplasm of rectum: Secondary | ICD-10-CM

## 2011-08-03 MED ORDER — DEXAMETHASONE SODIUM PHOSPHATE 10 MG/ML IJ SOLN: 10.0000 mg | Freq: Once | INTRAMUSCULAR | Status: DC

## 2011-08-03 MED ORDER — CETUXIMAB CHEMO IV INJECTION 200 MG/100ML
250.0000 mg/m2 | Freq: Once | INTRAVENOUS | Status: AC
  Administered 2011-08-03: 600 mg via INTRAVENOUS
  Filled 2011-08-03: qty 300

## 2011-08-03 MED ORDER — ONDANSETRON HCL 40 MG/20ML IJ SOLN: 8.0000 mg | Freq: Once | INTRAMUSCULAR | Status: DC

## 2011-08-03 MED ORDER — LEUCOVORIN CALCIUM INJECTION 100 MG
20.0000 mg/m2 | Freq: Once | INTRAMUSCULAR | Status: AC
  Administered 2011-08-03: 44 mg via INTRAVENOUS
  Filled 2011-08-03: qty 2.2

## 2011-08-03 MED ORDER — LEUCOVORIN CALCIUM INJECTION 350 MG: 400.0000 mg/m2 | Freq: Once | INTRAVENOUS | Status: DC

## 2011-08-03 MED ORDER — DIPHENHYDRAMINE HCL 50 MG/ML IJ SOLN
50.0000 mg | Freq: Once | INTRAMUSCULAR | Status: AC
  Administered 2011-08-03: 50 mg via INTRAVENOUS

## 2011-08-03 MED ORDER — DIPHENHYDRAMINE HCL 50 MG/ML IJ SOLN
INTRAMUSCULAR | Status: AC
  Filled 2011-08-03: qty 1

## 2011-08-03 MED ORDER — SODIUM CHLORIDE 0.9 % IJ SOLN
10.0000 mL | INTRAMUSCULAR | Status: DC | PRN
  Filled 2011-08-03: qty 10

## 2011-08-03 MED ORDER — HEPARIN SOD (PORK) LOCK FLUSH 100 UNIT/ML IV SOLN
500.0000 [IU] | Freq: Once | INTRAVENOUS | Status: DC | PRN
  Filled 2011-08-03: qty 5

## 2011-08-03 MED ORDER — SODIUM CHLORIDE 0.9 % IV SOLN
Freq: Once | INTRAVENOUS | Status: AC
  Administered 2011-08-03: 8 mg via INTRAVENOUS
  Filled 2011-08-03: qty 4

## 2011-08-03 MED ORDER — FLUOROURACIL CHEMO INJECTION 2.5 GM/50ML
400.0000 mg/m2 | Freq: Once | INTRAVENOUS | Status: AC
  Administered 2011-08-03: 900 mg via INTRAVENOUS
  Filled 2011-08-03: qty 18

## 2011-08-03 MED ORDER — FLUOROURACIL CHEMO INJECTION 5 GM/100ML
2400.0000 mg/m2 | INTRAVENOUS | Status: DC
  Administered 2011-08-03: 5350 mg via INTRAVENOUS
  Filled 2011-08-03 (×2): qty 107

## 2011-08-03 MED ORDER — SODIUM CHLORIDE 0.9 % IV SOLN
Freq: Once | INTRAVENOUS | Status: AC
  Administered 2011-08-03: 500 mL via INTRAVENOUS

## 2011-08-03 NOTE — Progress Notes (Signed)
Martin Maldonado tolerated infusions well and without incident; verbalizes understanding for follow-up.  No distress noted at time of discharge and patient was discharged home by himself.

## 2011-08-05 ENCOUNTER — Encounter (HOSPITAL_BASED_OUTPATIENT_CLINIC_OR_DEPARTMENT_OTHER): Payer: Managed Care, Other (non HMO)

## 2011-08-05 DIAGNOSIS — C19 Malignant neoplasm of rectosigmoid junction: Secondary | ICD-10-CM

## 2011-08-05 DIAGNOSIS — Z452 Encounter for adjustment and management of vascular access device: Secondary | ICD-10-CM

## 2011-08-05 DIAGNOSIS — C7800 Secondary malignant neoplasm of unspecified lung: Secondary | ICD-10-CM

## 2011-08-05 MED ORDER — HEPARIN SOD (PORK) LOCK FLUSH 100 UNIT/ML IV SOLN
INTRAVENOUS | Status: AC
  Filled 2011-08-05: qty 5

## 2011-08-05 MED ORDER — HEPARIN SOD (PORK) LOCK FLUSH 100 UNIT/ML IV SOLN
500.0000 [IU] | Freq: Once | INTRAVENOUS | Status: AC
  Administered 2011-08-05: 500 [IU] via INTRAVENOUS
  Filled 2011-08-05: qty 5

## 2011-08-05 MED ORDER — SODIUM CHLORIDE 0.9 % IJ SOLN
INTRAMUSCULAR | Status: AC
  Filled 2011-08-05: qty 10

## 2011-08-05 MED ORDER — SODIUM CHLORIDE 0.9 % IJ SOLN
10.0000 mL | INTRAMUSCULAR | Status: DC | PRN
  Administered 2011-08-05: 10 mL via INTRAVENOUS
  Filled 2011-08-05: qty 10

## 2011-08-05 NOTE — Progress Notes (Signed)
Martin Maldonado presented for Portacath access and flush.  Proper placement of portacath confirmed by CXR.  Portacath located right chest wall accessed with  H 20 needle.  Portacath flushed with 10ml NS and 500U/1ml Heparin and needle removed intact.  Procedure without incident.  Patient tolerated procedure well.

## 2011-08-09 ENCOUNTER — Encounter (HOSPITAL_BASED_OUTPATIENT_CLINIC_OR_DEPARTMENT_OTHER): Payer: Managed Care, Other (non HMO)

## 2011-08-09 ENCOUNTER — Other Ambulatory Visit (HOSPITAL_COMMUNITY): Payer: Self-pay | Admitting: Oncology

## 2011-08-09 DIAGNOSIS — C19 Malignant neoplasm of rectosigmoid junction: Secondary | ICD-10-CM

## 2011-08-09 DIAGNOSIS — C20 Malignant neoplasm of rectum: Secondary | ICD-10-CM

## 2011-08-09 DIAGNOSIS — C7800 Secondary malignant neoplasm of unspecified lung: Secondary | ICD-10-CM

## 2011-08-09 LAB — DIFFERENTIAL
Basophils Relative: 0 % (ref 0–1)
Eosinophils Absolute: 0.1 10*3/uL (ref 0.0–0.7)
Monocytes Relative: 6 % (ref 3–12)
Neutrophils Relative %: 62 % (ref 43–77)

## 2011-08-09 LAB — CBC
Hemoglobin: 14.5 g/dL (ref 13.0–17.0)
MCH: 31.9 pg (ref 26.0–34.0)
MCHC: 33.9 g/dL (ref 30.0–36.0)
RDW: 17.8 % — ABNORMAL HIGH (ref 11.5–15.5)

## 2011-08-09 NOTE — Progress Notes (Signed)
Labs drawn today for cbc/diff 

## 2011-08-10 ENCOUNTER — Encounter (HOSPITAL_BASED_OUTPATIENT_CLINIC_OR_DEPARTMENT_OTHER): Payer: Managed Care, Other (non HMO)

## 2011-08-10 VITALS — BP 138/91 | HR 74 | Temp 98.4°F | Wt 214.0 lb

## 2011-08-10 DIAGNOSIS — C19 Malignant neoplasm of rectosigmoid junction: Secondary | ICD-10-CM

## 2011-08-10 DIAGNOSIS — C20 Malignant neoplasm of rectum: Secondary | ICD-10-CM

## 2011-08-10 DIAGNOSIS — Z5112 Encounter for antineoplastic immunotherapy: Secondary | ICD-10-CM

## 2011-08-10 DIAGNOSIS — C7800 Secondary malignant neoplasm of unspecified lung: Secondary | ICD-10-CM

## 2011-08-10 MED ORDER — HEPARIN SOD (PORK) LOCK FLUSH 100 UNIT/ML IV SOLN
INTRAVENOUS | Status: AC
  Filled 2011-08-10: qty 5

## 2011-08-10 MED ORDER — SODIUM CHLORIDE 0.9 % IJ SOLN
INTRAMUSCULAR | Status: AC
  Filled 2011-08-10: qty 10

## 2011-08-10 MED ORDER — SODIUM CHLORIDE 0.9 % IV SOLN
Freq: Once | INTRAVENOUS | Status: AC
  Administered 2011-08-10: 09:00:00 via INTRAVENOUS

## 2011-08-10 MED ORDER — DIPHENHYDRAMINE HCL 50 MG/ML IJ SOLN
50.0000 mg | Freq: Once | INTRAMUSCULAR | Status: AC
  Administered 2011-08-10: 50 mg via INTRAVENOUS

## 2011-08-10 MED ORDER — CETUXIMAB CHEMO IV INJECTION 200 MG/100ML
250.0000 mg/m2 | Freq: Once | INTRAVENOUS | Status: AC
  Administered 2011-08-10: 600 mg via INTRAVENOUS
  Filled 2011-08-10: qty 300

## 2011-08-10 MED ORDER — DIPHENHYDRAMINE HCL 50 MG/ML IJ SOLN
INTRAMUSCULAR | Status: AC
  Filled 2011-08-10: qty 1

## 2011-08-10 MED ORDER — SODIUM CHLORIDE 0.9 % IJ SOLN
10.0000 mL | INTRAMUSCULAR | Status: DC | PRN
  Administered 2011-08-10: 10 mL
  Filled 2011-08-10: qty 10

## 2011-08-10 MED ORDER — HEPARIN SOD (PORK) LOCK FLUSH 100 UNIT/ML IV SOLN
500.0000 [IU] | Freq: Once | INTRAVENOUS | Status: AC | PRN
  Administered 2011-08-10: 500 [IU]
  Filled 2011-08-10: qty 5

## 2011-08-17 ENCOUNTER — Encounter (HOSPITAL_COMMUNITY): Payer: Managed Care, Other (non HMO)

## 2011-08-17 ENCOUNTER — Encounter (HOSPITAL_BASED_OUTPATIENT_CLINIC_OR_DEPARTMENT_OTHER): Payer: Managed Care, Other (non HMO)

## 2011-08-17 VITALS — BP 147/92 | HR 68 | Temp 98.1°F | Wt 210.4 lb

## 2011-08-17 DIAGNOSIS — C20 Malignant neoplasm of rectum: Secondary | ICD-10-CM

## 2011-08-17 DIAGNOSIS — Z5111 Encounter for antineoplastic chemotherapy: Secondary | ICD-10-CM

## 2011-08-17 DIAGNOSIS — Z5112 Encounter for antineoplastic immunotherapy: Secondary | ICD-10-CM

## 2011-08-17 LAB — DIFFERENTIAL
Basophils Absolute: 0.1 10*3/uL (ref 0.0–0.1)
Lymphocytes Relative: 15 % (ref 12–46)
Monocytes Absolute: 0.7 10*3/uL (ref 0.1–1.0)
Neutro Abs: 7.3 10*3/uL (ref 1.7–7.7)

## 2011-08-17 LAB — BASIC METABOLIC PANEL
CO2: 24 mEq/L (ref 19–32)
Chloride: 101 mEq/L (ref 96–112)
Creatinine, Ser: 1.35 mg/dL (ref 0.50–1.35)

## 2011-08-17 LAB — CBC
HCT: 40.6 % (ref 39.0–52.0)
Hemoglobin: 13.9 g/dL (ref 13.0–17.0)
RDW: 18.5 % — ABNORMAL HIGH (ref 11.5–15.5)
WBC: 9.9 10*3/uL (ref 4.0–10.5)

## 2011-08-17 MED ORDER — DEXAMETHASONE SODIUM PHOSPHATE 10 MG/ML IJ SOLN: 10.0000 mg | Freq: Once | INTRAMUSCULAR | Status: DC

## 2011-08-17 MED ORDER — SODIUM CHLORIDE 0.9 % IV SOLN
2400.0000 mg/m2 | INTRAVENOUS | Status: DC
  Administered 2011-08-17: 5350 mg via INTRAVENOUS
  Filled 2011-08-17 (×2): qty 107

## 2011-08-17 MED ORDER — SODIUM CHLORIDE 0.9 % IV SOLN
Freq: Once | INTRAVENOUS | Status: AC
  Administered 2011-08-17: 09:00:00 via INTRAVENOUS

## 2011-08-17 MED ORDER — LEUCOVORIN CALCIUM INJECTION 100 MG
20.0000 mg/m2 | Freq: Once | INTRAMUSCULAR | Status: AC
  Administered 2011-08-17: 44 mg via INTRAVENOUS
  Filled 2011-08-17: qty 2.2

## 2011-08-17 MED ORDER — SODIUM CHLORIDE 0.9 % IV SOLN
Freq: Once | INTRAVENOUS | Status: AC
  Administered 2011-08-17: 8 mg via INTRAVENOUS
  Filled 2011-08-17: qty 4

## 2011-08-17 MED ORDER — CETUXIMAB CHEMO IV INJECTION 200 MG/100ML
250.0000 mg/m2 | Freq: Once | INTRAVENOUS | Status: AC
  Administered 2011-08-17: 600 mg via INTRAVENOUS
  Filled 2011-08-17: qty 300

## 2011-08-17 MED ORDER — FLUOROURACIL CHEMO INJECTION 2.5 GM/50ML
400.0000 mg/m2 | Freq: Once | INTRAVENOUS | Status: AC
  Administered 2011-08-17: 900 mg via INTRAVENOUS
  Filled 2011-08-17: qty 18

## 2011-08-17 MED ORDER — LEUCOVORIN CALCIUM INJECTION 350 MG: 400.0000 mg/m2 | Freq: Once | INTRAVENOUS | Status: DC

## 2011-08-17 MED ORDER — DIPHENHYDRAMINE HCL 50 MG/ML IJ SOLN
50.0000 mg | Freq: Once | INTRAMUSCULAR | Status: AC
  Administered 2011-08-17: 50 mg via INTRAVENOUS

## 2011-08-17 MED ORDER — SODIUM CHLORIDE 0.9 % IV SOLN: 8.0000 mg | Freq: Once | INTRAVENOUS | Status: DC

## 2011-08-17 MED ORDER — SODIUM CHLORIDE 0.9 % IJ SOLN
10.0000 mL | INTRAMUSCULAR | Status: DC | PRN
  Filled 2011-08-17: qty 10

## 2011-08-17 MED ORDER — HEPARIN SOD (PORK) LOCK FLUSH 100 UNIT/ML IV SOLN
500.0000 [IU] | Freq: Once | INTRAVENOUS | Status: DC | PRN
  Filled 2011-08-17: qty 5

## 2011-08-17 MED ORDER — DIPHENHYDRAMINE HCL 50 MG/ML IJ SOLN
INTRAMUSCULAR | Status: AC
  Filled 2011-08-17: qty 1

## 2011-08-19 ENCOUNTER — Encounter (HOSPITAL_BASED_OUTPATIENT_CLINIC_OR_DEPARTMENT_OTHER): Payer: Managed Care, Other (non HMO)

## 2011-08-19 DIAGNOSIS — Z452 Encounter for adjustment and management of vascular access device: Secondary | ICD-10-CM

## 2011-08-19 DIAGNOSIS — C20 Malignant neoplasm of rectum: Secondary | ICD-10-CM

## 2011-08-19 MED ORDER — HEPARIN SOD (PORK) LOCK FLUSH 100 UNIT/ML IV SOLN
500.0000 [IU] | Freq: Once | INTRAVENOUS | Status: AC | PRN
  Administered 2011-08-19: 500 [IU]
  Filled 2011-08-19: qty 5

## 2011-08-19 MED ORDER — SODIUM CHLORIDE 0.9 % IJ SOLN
10.0000 mL | INTRAMUSCULAR | Status: DC | PRN
  Administered 2011-08-19: 10 mL
  Filled 2011-08-19: qty 10

## 2011-08-19 MED ORDER — SODIUM CHLORIDE 0.9 % IJ SOLN
INTRAMUSCULAR | Status: AC
  Filled 2011-08-19: qty 20

## 2011-08-19 MED ORDER — HEPARIN SOD (PORK) LOCK FLUSH 100 UNIT/ML IV SOLN
INTRAVENOUS | Status: AC
  Filled 2011-08-19: qty 5

## 2011-08-19 NOTE — Progress Notes (Signed)
Martin Maldonado presented for d/c of home infusion pump and Portacath flush.  Proper placement of portacath confirmed by CXR.  Portacath located right chest wall accessed with  H 20 needle.  Good blood return present. Portacath flushed with 20ml NS and 500U/86ml Heparin and needle removed intact.  Procedure without incident.  Patient tolerated procedure well.

## 2011-08-23 ENCOUNTER — Encounter (HOSPITAL_COMMUNITY): Payer: Managed Care, Other (non HMO) | Attending: Oncology

## 2011-08-23 DIAGNOSIS — C20 Malignant neoplasm of rectum: Secondary | ICD-10-CM

## 2011-08-23 LAB — COMPREHENSIVE METABOLIC PANEL
BUN: 20 mg/dL (ref 6–23)
CO2: 26 mEq/L (ref 19–32)
Calcium: 9.6 mg/dL (ref 8.4–10.5)
Creatinine, Ser: 1.5 mg/dL — ABNORMAL HIGH (ref 0.50–1.35)
GFR calc Af Amer: 59 mL/min — ABNORMAL LOW (ref >90)
GFR calc non Af Amer: 51 mL/min — ABNORMAL LOW (ref >90)
Glucose, Bld: 99 mg/dL (ref 70–99)

## 2011-08-23 LAB — CBC
HCT: 41 % (ref 39.0–52.0)
MCV: 95.8 fL (ref 78.0–100.0)
RDW: 17.8 % — ABNORMAL HIGH (ref 11.5–15.5)
WBC: 5.4 10*3/uL (ref 4.0–10.5)

## 2011-08-23 LAB — CEA: CEA: 7.4 ng/mL — ABNORMAL HIGH (ref 0.0–5.0)

## 2011-08-23 LAB — DIFFERENTIAL
Basophils Absolute: 0.1 10*3/uL (ref 0.0–0.1)
Eosinophils Relative: 3 % (ref 0–5)
Lymphocytes Relative: 26 % (ref 12–46)
Lymphs Abs: 1.4 10*3/uL (ref 0.7–4.0)
Monocytes Absolute: 0.4 10*3/uL (ref 0.1–1.0)

## 2011-08-23 NOTE — Progress Notes (Signed)
Lab draw

## 2011-08-24 ENCOUNTER — Encounter (HOSPITAL_BASED_OUTPATIENT_CLINIC_OR_DEPARTMENT_OTHER): Payer: Managed Care, Other (non HMO)

## 2011-08-24 VITALS — BP 119/71 | HR 102 | Temp 98.1°F | Ht 71.0 in | Wt 214.0 lb

## 2011-08-24 DIAGNOSIS — C20 Malignant neoplasm of rectum: Secondary | ICD-10-CM

## 2011-08-24 DIAGNOSIS — Z5112 Encounter for antineoplastic immunotherapy: Secondary | ICD-10-CM

## 2011-08-24 MED ORDER — HEPARIN SOD (PORK) LOCK FLUSH 100 UNIT/ML IV SOLN
INTRAVENOUS | Status: AC
  Filled 2011-08-24: qty 5

## 2011-08-24 MED ORDER — DIPHENHYDRAMINE HCL 50 MG/ML IJ SOLN
INTRAMUSCULAR | Status: AC
  Filled 2011-08-24: qty 1

## 2011-08-24 MED ORDER — SODIUM CHLORIDE 0.9 % IV SOLN
Freq: Once | INTRAVENOUS | Status: AC
  Administered 2011-08-24: 09:00:00 via INTRAVENOUS

## 2011-08-24 MED ORDER — SODIUM CHLORIDE 0.9 % IJ SOLN
INTRAMUSCULAR | Status: AC
  Filled 2011-08-24: qty 10

## 2011-08-24 MED ORDER — CETUXIMAB CHEMO IV INJECTION 200 MG/100ML
250.0000 mg/m2 | Freq: Once | INTRAVENOUS | Status: AC
  Administered 2011-08-24: 600 mg via INTRAVENOUS
  Filled 2011-08-24: qty 300

## 2011-08-24 MED ORDER — DIPHENHYDRAMINE HCL 50 MG/ML IJ SOLN
50.0000 mg | Freq: Once | INTRAMUSCULAR | Status: AC
  Administered 2011-08-24: 50 mg via INTRAVENOUS

## 2011-08-24 MED ORDER — SODIUM CHLORIDE 0.9 % IJ SOLN
10.0000 mL | INTRAMUSCULAR | Status: DC | PRN
  Administered 2011-08-24: 10 mL
  Filled 2011-08-24: qty 10

## 2011-08-24 MED ORDER — HEPARIN SOD (PORK) LOCK FLUSH 100 UNIT/ML IV SOLN
500.0000 [IU] | Freq: Once | INTRAVENOUS | Status: AC | PRN
  Administered 2011-08-24: 500 [IU]
  Filled 2011-08-24: qty 5

## 2011-08-27 ENCOUNTER — Encounter (HOSPITAL_BASED_OUTPATIENT_CLINIC_OR_DEPARTMENT_OTHER): Payer: Managed Care, Other (non HMO) | Admitting: Oncology

## 2011-08-27 ENCOUNTER — Encounter (HOSPITAL_COMMUNITY): Payer: Self-pay | Admitting: *Deleted

## 2011-08-27 VITALS — BP 146/90 | HR 69 | Temp 97.7°F | Wt 215.0 lb

## 2011-08-27 DIAGNOSIS — J449 Chronic obstructive pulmonary disease, unspecified: Secondary | ICD-10-CM

## 2011-08-27 DIAGNOSIS — C7800 Secondary malignant neoplasm of unspecified lung: Secondary | ICD-10-CM

## 2011-08-27 DIAGNOSIS — C20 Malignant neoplasm of rectum: Secondary | ICD-10-CM

## 2011-08-27 MED ORDER — DEXAMETHASONE 4 MG PO TABS: ORAL_TABLET | ORAL | Status: DC

## 2011-08-27 NOTE — Progress Notes (Signed)
Martin Peri, MD, MD [address]  [place] [address]  1. Rectal cancer  CBC, Differential, Basic metabolic panel, CBC, Differential, Comprehensive metabolic panel, CBC, Differential, Basic metabolic panel, CBC, Differential, Comprehensive metabolic panel, CT Abdomen Pelvis W Contrast, CT Chest W Contrast    CURRENT THERAPY: S/P treatments 8 of 5-FU and Leucovorin and S/P cycle 4 of weekly Cetuximab.   INTERVAL HISTORY: Martin Maldonado 56 y.o. 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  Martin Maldonado denies any complaints.  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  He is nearing his 6 cycle of leucovorin and 5-FU which will require restaging CT scans to evaluate for regression of his tumor.  We will perform these at the end of July approximately 1 week following completion of his 6th cycle.   He denies any oncologic complaints today, but on further questioning, he does report some minimal nausea that is well controlled with lorazepam.  He also has compazine at home if needed.   He smells of tobacco, but he denies smoking.  He admits that he "hangs around" with people who smoke and is the reason he smells of tobacco smoke.     Past Medical History  Diagnosis Date  . Hypertension   . Rectal cancer 2008    Adenocarcinoma; AP resection in 2008; recurrence with lung metastases in 2011  . Arteriosclerotic cardiovascular disease (ASCVD) 2011    CABG surgery in 2011  . Hyperlipidemia   . 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 Splenomegaly 01/23/2009    Additional GI history includes small bowel obstruction, requiring laparotomy and lysis of adhesions in 2009, remote peptic ulcer disease and postoperative perirectal infection  . ALCOHOL ABUSE, HX OF 01/23/2009    has HYPERLIPIDEMIA; Tobacco abuse, in remission; HYPERTENSION; COPD; SPLENOMEGALY; SMALL BOWEL  OBSTRUCTION, HX OF; and Rectal cancer on his problem list.      has no known allergies.  Martin Maldonado does not currently have medications on file.  Past Surgical History  Procedure Date  . Irrigation and debridement sebaceous cyst   . Laparoscopic lysis intestinal adhesions 2009    With incidental appendectomy  . Coronary artery bypass graft 2011    2 vessel  . Abdominoperineal proctocolectomy 2008  . Portacath placement 2011    Denies any headaches, dizziness, double vision, fevers, chills, night sweats, nausea, vomiting, diarrhea, constipation, chest pain, heart palpitations, shortness of breath, blood in stool, black tarry stool, urinary pain, urinary burning, urinary frequency, hematuria.   PHYSICAL EXAMINATION  ECOG PERFORMANCE STATUS: 1 - Symptomatic but completely ambulatory  Filed Vitals:   08/27/11 1028  BP: 146/90  Pulse: 69  Temp: 97.7 F (36.5 C)    GENERAL:alert, no distress, well nourished, well developed, comfortable, cooperative and smiling SKIN: skin color, texture, turgor are normal, acne like rash HEAD: Normocephalic, No masses, lesions, tenderness or abnormalities, acne-like rash EYES: normal, Conjunctiva are pink and non-injected EARS: External ears normal OROPHARYNX:lips, buccal mucosa, and tongue normal and mucous membranes are moist  NECK: supple, trachea midline LYMPH:  no palpable lymphadenopathy BREAST:not examined LUNGS: clear to auscultation and percussion HEART: regular rate & rhythm, no murmurs, no gallops, S1 normal and S2 normal ABDOMEN:abdomen soft, non-tender and normal bowel sounds BACK: Back symmetric, no curvature. EXTREMITIES:less then 2 second capillary refill, no joint deformities, effusion, or inflammation, no edema, no skin discoloration,  no clubbing, no cyanosis  NEURO: alert & oriented x 3 with fluent speech, no focal motor/sensory deficits, gait normal    LABORATORY DATA: CBC    Component Value Date/Time   WBC 5.4 08/23/2011  0839   RBC 4.28 08/23/2011 0839   HGB 13.9 08/23/2011 0839   HCT 41.0 08/23/2011 0839   PLT 173 08/23/2011 0839   MCV 95.8 08/23/2011 0839   MCH 32.5 08/23/2011 0839   MCHC 33.9 08/23/2011 0839   RDW 17.8* 08/23/2011 0839   LYMPHSABS 1.4 08/23/2011 0839   MONOABS 0.4 08/23/2011 0839   EOSABS 0.2 08/23/2011 0839   BASOSABS 0.1 08/23/2011 0839      Chemistry      Component Value Date/Time   NA 137 08/23/2011 0839   K 4.2 08/23/2011 0839   CL 101 08/23/2011 0839   CO2 26 08/23/2011 0839   BUN 20 08/23/2011 0839   CREATININE 1.50* 08/23/2011 0839      Component Value Date/Time   CALCIUM 9.6 08/23/2011 0839   ALKPHOS 117 08/23/2011 0839   AST 17 08/23/2011 0839   ALT 27 08/23/2011 0839   BILITOT 0.4 08/23/2011 0839          ASSESSMENT:  1. 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 2. Rash secondary to Cetuximab, improved  3. CABG on 06/09/2009.  4. Chronic obstructive pulmonary disease from smoking x many years, though he has quit.  5. Small bowel obstruction requiring surgery in February 2009.  6. Rectal cancer surgery in February 2008 with 1 of 12 positive nodes with LVI and a 4 cm primary, status post 6 cycles of adjuvant oxaliplatin, capecitabine and Avastin since there was concerned he had postoperative disease left behind in the pelvis, and he also received postoperative radiation therapy with concomitant capecitabine at that time.  7. Chronic back pain.    PLAN:  1. I personally reviewed and went over laboratory results with the patient. 2. Rx for Decadron to be taken as directed for anti-emetics. 3. Pre-chemo lab work ordered.  4. Continue with chemotherapy as scheduled.  5. Restaging CT CAP with contrast at end of July following 6 cycles of chemotherapy. 6. Return following CT scans for follow-up and discussion regarding future intervention.  All questions were answered. The patient knows to call the clinic with any problems,  questions or concerns. We can certainly see the patient much sooner if necessary.  The patient and plan discussed with Martin Gaul, MD and he is in agreement with the aforementioned.   Martin Maldonado

## 2011-08-30 ENCOUNTER — Encounter (HOSPITAL_BASED_OUTPATIENT_CLINIC_OR_DEPARTMENT_OTHER): Payer: Managed Care, Other (non HMO)

## 2011-08-30 DIAGNOSIS — C20 Malignant neoplasm of rectum: Secondary | ICD-10-CM

## 2011-08-30 LAB — CBC
Hemoglobin: 14.2 g/dL (ref 13.0–17.0)
MCH: 32.6 pg (ref 26.0–34.0)
Platelets: 169 10*3/uL (ref 150–400)
RBC: 4.36 MIL/uL (ref 4.22–5.81)
WBC: 9.6 10*3/uL (ref 4.0–10.5)

## 2011-08-30 LAB — BASIC METABOLIC PANEL
BUN: 18 mg/dL (ref 6–23)
CO2: 23 mEq/L (ref 19–32)
Chloride: 104 mEq/L (ref 96–112)
GFR calc non Af Amer: 39 mL/min — ABNORMAL LOW (ref >90)
Glucose, Bld: 99 mg/dL (ref 70–99)
Potassium: 4.3 mEq/L (ref 3.5–5.1)
Sodium: 139 mEq/L (ref 135–145)

## 2011-08-30 LAB — DIFFERENTIAL
Lymphocytes Relative: 21 % (ref 12–46)
Lymphs Abs: 2 10*3/uL (ref 0.7–4.0)
Monocytes Relative: 7 % (ref 3–12)
Neutro Abs: 6.5 10*3/uL (ref 1.7–7.7)
Neutrophils Relative %: 68 % (ref 43–77)

## 2011-08-30 NOTE — Progress Notes (Signed)
Lab draw

## 2011-08-31 ENCOUNTER — Encounter (HOSPITAL_BASED_OUTPATIENT_CLINIC_OR_DEPARTMENT_OTHER): Payer: Managed Care, Other (non HMO)

## 2011-08-31 VITALS — BP 120/73 | HR 78 | Temp 98.0°F | Wt 214.0 lb

## 2011-08-31 DIAGNOSIS — C7800 Secondary malignant neoplasm of unspecified lung: Secondary | ICD-10-CM

## 2011-08-31 DIAGNOSIS — C20 Malignant neoplasm of rectum: Secondary | ICD-10-CM

## 2011-08-31 DIAGNOSIS — Z5111 Encounter for antineoplastic chemotherapy: Secondary | ICD-10-CM

## 2011-08-31 MED ORDER — SODIUM CHLORIDE 0.9 % IV SOLN: 8.0000 mg | Freq: Once | INTRAVENOUS | Status: DC

## 2011-08-31 MED ORDER — LEUCOVORIN CALCIUM INJECTION 350 MG: 400.0000 mg/m2 | Freq: Once | INTRAVENOUS | Status: DC

## 2011-08-31 MED ORDER — SODIUM CHLORIDE 0.9 % IV SOLN
Freq: Once | INTRAVENOUS | Status: AC
  Administered 2011-08-31: 8 mg via INTRAVENOUS
  Filled 2011-08-31: qty 4

## 2011-08-31 MED ORDER — SODIUM CHLORIDE 0.9 % IV SOLN
Freq: Once | INTRAVENOUS | Status: AC
  Administered 2011-08-31: 10:00:00 via INTRAVENOUS

## 2011-08-31 MED ORDER — LEUCOVORIN CALCIUM INJECTION 100 MG
20.0000 mg/m2 | Freq: Once | INTRAMUSCULAR | Status: AC
  Administered 2011-08-31: 44 mg via INTRAVENOUS
  Filled 2011-08-31: qty 2.2

## 2011-08-31 MED ORDER — DEXTROSE 5 % IV SOLN
Freq: Once | INTRAVENOUS | Status: DC
  Filled 2011-08-31: qty 1000

## 2011-08-31 MED ORDER — DIPHENHYDRAMINE HCL 50 MG/ML IJ SOLN
INTRAMUSCULAR | Status: AC
  Filled 2011-08-31: qty 1

## 2011-08-31 MED ORDER — DIPHENHYDRAMINE HCL 50 MG/ML IJ SOLN
50.0000 mg | Freq: Once | INTRAMUSCULAR | Status: AC
  Administered 2011-08-31: 50 mg via INTRAVENOUS

## 2011-08-31 MED ORDER — SODIUM CHLORIDE 0.9 % IV SOLN
2400.0000 mg/m2 | INTRAVENOUS | Status: DC
  Administered 2011-08-31: 5350 mg via INTRAVENOUS
  Filled 2011-08-31 (×2): qty 107

## 2011-08-31 MED ORDER — CETUXIMAB CHEMO IV INJECTION 200 MG/100ML
250.0000 mg/m2 | Freq: Once | INTRAVENOUS | Status: AC
  Administered 2011-08-31: 600 mg via INTRAVENOUS
  Filled 2011-08-31: qty 300

## 2011-08-31 MED ORDER — DEXAMETHASONE SODIUM PHOSPHATE 10 MG/ML IJ SOLN: 10.0000 mg | Freq: Once | INTRAMUSCULAR | Status: DC

## 2011-08-31 MED ORDER — FLUOROURACIL CHEMO INJECTION 2.5 GM/50ML
400.0000 mg/m2 | Freq: Once | INTRAVENOUS | Status: AC
  Administered 2011-08-31: 900 mg via INTRAVENOUS
  Filled 2011-08-31: qty 18

## 2011-09-02 ENCOUNTER — Encounter (HOSPITAL_BASED_OUTPATIENT_CLINIC_OR_DEPARTMENT_OTHER): Payer: Managed Care, Other (non HMO)

## 2011-09-02 DIAGNOSIS — Z452 Encounter for adjustment and management of vascular access device: Secondary | ICD-10-CM

## 2011-09-02 DIAGNOSIS — C7800 Secondary malignant neoplasm of unspecified lung: Secondary | ICD-10-CM

## 2011-09-02 DIAGNOSIS — C20 Malignant neoplasm of rectum: Secondary | ICD-10-CM

## 2011-09-02 MED ORDER — HEPARIN SOD (PORK) LOCK FLUSH 100 UNIT/ML IV SOLN
INTRAVENOUS | Status: AC
  Filled 2011-09-02: qty 5

## 2011-09-02 MED ORDER — SODIUM CHLORIDE 0.9 % IJ SOLN
10.0000 mL | INTRAMUSCULAR | Status: DC | PRN
  Administered 2011-09-02: 10 mL
  Filled 2011-09-02: qty 10

## 2011-09-02 MED ORDER — HEPARIN SOD (PORK) LOCK FLUSH 100 UNIT/ML IV SOLN
500.0000 [IU] | Freq: Once | INTRAVENOUS | Status: AC | PRN
  Administered 2011-09-02: 500 [IU]
  Filled 2011-09-02: qty 5

## 2011-09-02 MED ORDER — SODIUM CHLORIDE 0.9 % IJ SOLN
INTRAMUSCULAR | Status: AC
  Filled 2011-09-02: qty 20

## 2011-09-02 NOTE — Progress Notes (Signed)
Martin Maldonado presented for Portacath deaccess and flush after 46 hr CI of 5FU.Marland Kitchen Proper placement of portacath confirmed by CXR. Portacath located rt chest wall  deaccessed .  Portacath flushed with 20ml NS and 500U/50ml Heparin and needle removed intact. Procedure without incident. Patient tolerated procedure well and tolerated chemo well.Marland Kitchen

## 2011-09-06 ENCOUNTER — Encounter (HOSPITAL_BASED_OUTPATIENT_CLINIC_OR_DEPARTMENT_OTHER): Payer: Managed Care, Other (non HMO)

## 2011-09-06 DIAGNOSIS — C20 Malignant neoplasm of rectum: Secondary | ICD-10-CM

## 2011-09-06 LAB — CBC
HCT: 38.7 % — ABNORMAL LOW (ref 39.0–52.0)
Hemoglobin: 13.1 g/dL (ref 13.0–17.0)
MCH: 32.5 pg (ref 26.0–34.0)
MCHC: 33.9 g/dL (ref 30.0–36.0)
RBC: 4.03 MIL/uL — ABNORMAL LOW (ref 4.22–5.81)

## 2011-09-06 LAB — COMPREHENSIVE METABOLIC PANEL
Albumin: 3.8 g/dL (ref 3.5–5.2)
Alkaline Phosphatase: 97 U/L (ref 39–117)
BUN: 27 mg/dL — ABNORMAL HIGH (ref 6–23)
CO2: 25 mEq/L (ref 19–32)
Chloride: 102 mEq/L (ref 96–112)
Creatinine, Ser: 1.39 mg/dL — ABNORMAL HIGH (ref 0.50–1.35)
GFR calc Af Amer: 65 mL/min — ABNORMAL LOW (ref >90)
GFR calc non Af Amer: 56 mL/min — ABNORMAL LOW (ref >90)
Glucose, Bld: 90 mg/dL (ref 70–99)
Total Bilirubin: 0.4 mg/dL (ref 0.3–1.2)

## 2011-09-06 LAB — DIFFERENTIAL
Basophils Relative: 0 % (ref 0–1)
Lymphs Abs: 2.7 10*3/uL (ref 0.7–4.0)
Monocytes Absolute: 0.4 10*3/uL (ref 0.1–1.0)
Monocytes Relative: 6 % (ref 3–12)
Neutro Abs: 3.6 10*3/uL (ref 1.7–7.7)

## 2011-09-06 NOTE — Progress Notes (Signed)
Labs drawn today for cbc/diff,cmp 

## 2011-09-07 ENCOUNTER — Encounter (HOSPITAL_BASED_OUTPATIENT_CLINIC_OR_DEPARTMENT_OTHER): Payer: Managed Care, Other (non HMO)

## 2011-09-07 VITALS — BP 153/87 | HR 81 | Temp 97.1°F | Wt 216.0 lb

## 2011-09-07 DIAGNOSIS — C7800 Secondary malignant neoplasm of unspecified lung: Secondary | ICD-10-CM

## 2011-09-07 DIAGNOSIS — C20 Malignant neoplasm of rectum: Secondary | ICD-10-CM

## 2011-09-07 DIAGNOSIS — Z5112 Encounter for antineoplastic immunotherapy: Secondary | ICD-10-CM

## 2011-09-07 DIAGNOSIS — C19 Malignant neoplasm of rectosigmoid junction: Secondary | ICD-10-CM

## 2011-09-07 MED ORDER — SODIUM CHLORIDE 0.9 % IJ SOLN
10.0000 mL | INTRAMUSCULAR | Status: DC | PRN
  Administered 2011-09-07: 10 mL
  Filled 2011-09-07: qty 10

## 2011-09-07 MED ORDER — SODIUM CHLORIDE 0.9 % IV SOLN
Freq: Once | INTRAVENOUS | Status: AC
  Administered 2011-09-07: 09:00:00 via INTRAVENOUS

## 2011-09-07 MED ORDER — HEPARIN SOD (PORK) LOCK FLUSH 100 UNIT/ML IV SOLN
INTRAVENOUS | Status: AC
  Filled 2011-09-07: qty 5

## 2011-09-07 MED ORDER — DIPHENHYDRAMINE HCL 50 MG/ML IJ SOLN
50.0000 mg | Freq: Once | INTRAMUSCULAR | Status: AC
  Administered 2011-09-07: 50 mg via INTRAVENOUS

## 2011-09-07 MED ORDER — SODIUM CHLORIDE 0.9 % IJ SOLN
INTRAMUSCULAR | Status: AC
  Filled 2011-09-07: qty 10

## 2011-09-07 MED ORDER — HEPARIN SOD (PORK) LOCK FLUSH 100 UNIT/ML IV SOLN
500.0000 [IU] | Freq: Once | INTRAVENOUS | Status: AC | PRN
  Administered 2011-09-07: 500 [IU]
  Filled 2011-09-07: qty 5

## 2011-09-07 MED ORDER — DIPHENHYDRAMINE HCL 50 MG/ML IJ SOLN
INTRAMUSCULAR | Status: AC
  Filled 2011-09-07: qty 1

## 2011-09-07 MED ORDER — CETUXIMAB CHEMO IV INJECTION 200 MG/100ML
250.0000 mg/m2 | Freq: Once | INTRAVENOUS | Status: AC
  Administered 2011-09-07: 600 mg via INTRAVENOUS
  Filled 2011-09-07: qty 300

## 2011-09-09 ENCOUNTER — Encounter (HOSPITAL_COMMUNITY): Payer: Managed Care, Other (non HMO)

## 2011-09-13 ENCOUNTER — Encounter (HOSPITAL_COMMUNITY): Payer: Managed Care, Other (non HMO)

## 2011-09-13 DIAGNOSIS — C20 Malignant neoplasm of rectum: Secondary | ICD-10-CM

## 2011-09-13 LAB — DIFFERENTIAL
Basophils Absolute: 0.1 10*3/uL (ref 0.0–0.1)
Eosinophils Relative: 3 % (ref 0–5)
Lymphocytes Relative: 20 % (ref 12–46)
Lymphs Abs: 1.7 10*3/uL (ref 0.7–4.0)
Monocytes Absolute: 0.6 10*3/uL (ref 0.1–1.0)
Monocytes Relative: 7 % (ref 3–12)
Neutro Abs: 6.1 10*3/uL (ref 1.7–7.7)

## 2011-09-13 LAB — CBC
HCT: 40.5 % (ref 39.0–52.0)
Hemoglobin: 13.9 g/dL (ref 13.0–17.0)
MCV: 97.6 fL (ref 78.0–100.0)
RBC: 4.15 MIL/uL — ABNORMAL LOW (ref 4.22–5.81)
WBC: 8.7 10*3/uL (ref 4.0–10.5)

## 2011-09-13 LAB — BASIC METABOLIC PANEL
BUN: 26 mg/dL — ABNORMAL HIGH (ref 6–23)
CO2: 18 mEq/L — ABNORMAL LOW (ref 19–32)
Calcium: 9.6 mg/dL (ref 8.4–10.5)
Chloride: 104 mEq/L (ref 96–112)
Creatinine, Ser: 1.92 mg/dL — ABNORMAL HIGH (ref 0.50–1.35)
Glucose, Bld: 120 mg/dL — ABNORMAL HIGH (ref 70–99)

## 2011-09-14 ENCOUNTER — Encounter (HOSPITAL_BASED_OUTPATIENT_CLINIC_OR_DEPARTMENT_OTHER): Payer: Managed Care, Other (non HMO)

## 2011-09-14 DIAGNOSIS — C19 Malignant neoplasm of rectosigmoid junction: Secondary | ICD-10-CM

## 2011-09-14 DIAGNOSIS — C7800 Secondary malignant neoplasm of unspecified lung: Secondary | ICD-10-CM

## 2011-09-14 DIAGNOSIS — C20 Malignant neoplasm of rectum: Secondary | ICD-10-CM

## 2011-09-14 DIAGNOSIS — Z5111 Encounter for antineoplastic chemotherapy: Secondary | ICD-10-CM

## 2011-09-14 MED ORDER — FLUOROURACIL CHEMO INJECTION 2.5 GM/50ML
400.0000 mg/m2 | Freq: Once | INTRAVENOUS | Status: AC
  Administered 2011-09-14: 900 mg via INTRAVENOUS
  Filled 2011-09-14: qty 18

## 2011-09-14 MED ORDER — LEUCOVORIN CALCIUM INJECTION 100 MG
20.0000 mg/m2 | Freq: Once | INTRAMUSCULAR | Status: AC
  Administered 2011-09-14: 44 mg via INTRAVENOUS
  Filled 2011-09-14: qty 2.2

## 2011-09-14 MED ORDER — HEPARIN SOD (PORK) LOCK FLUSH 100 UNIT/ML IV SOLN
500.0000 [IU] | Freq: Once | INTRAVENOUS | Status: DC | PRN
  Filled 2011-09-14: qty 5

## 2011-09-14 MED ORDER — SODIUM CHLORIDE 0.9 % IV SOLN
Freq: Once | INTRAVENOUS | Status: AC
  Administered 2011-09-14: 8 mg via INTRAVENOUS
  Filled 2011-09-14: qty 4

## 2011-09-14 MED ORDER — DIPHENHYDRAMINE HCL 50 MG/ML IJ SOLN
INTRAMUSCULAR | Status: AC
  Filled 2011-09-14: qty 1

## 2011-09-14 MED ORDER — DIPHENHYDRAMINE HCL 50 MG/ML IJ SOLN
50.0000 mg | Freq: Once | INTRAMUSCULAR | Status: AC
  Administered 2011-09-14: 50 mg via INTRAVENOUS

## 2011-09-14 MED ORDER — SODIUM CHLORIDE 0.9 % IV SOLN
2400.0000 mg/m2 | INTRAVENOUS | Status: DC
  Administered 2011-09-14: 5350 mg via INTRAVENOUS
  Filled 2011-09-14 (×2): qty 107

## 2011-09-14 MED ORDER — DEXAMETHASONE SODIUM PHOSPHATE 10 MG/ML IJ SOLN: 10.0000 mg | Freq: Once | INTRAMUSCULAR | Status: DC

## 2011-09-14 MED ORDER — CETUXIMAB CHEMO IV INJECTION 200 MG/100ML
250.0000 mg/m2 | Freq: Once | INTRAVENOUS | Status: AC
  Administered 2011-09-14: 600 mg via INTRAVENOUS
  Filled 2011-09-14: qty 300

## 2011-09-14 MED ORDER — SODIUM CHLORIDE 0.9 % IJ SOLN
10.0000 mL | INTRAMUSCULAR | Status: DC | PRN
  Filled 2011-09-14: qty 10

## 2011-09-14 MED ORDER — SODIUM CHLORIDE 0.9 % IV SOLN: 8.0000 mg | Freq: Once | INTRAVENOUS | Status: DC

## 2011-09-14 MED ORDER — LEUCOVORIN CALCIUM INJECTION 350 MG: 400.0000 mg/m2 | Freq: Once | INTRAVENOUS | Status: DC

## 2011-09-14 MED ORDER — SODIUM CHLORIDE 0.9 % IV SOLN
Freq: Once | INTRAVENOUS | Status: AC
  Administered 2011-09-14: 09:00:00 via INTRAVENOUS

## 2011-09-16 ENCOUNTER — Encounter (HOSPITAL_BASED_OUTPATIENT_CLINIC_OR_DEPARTMENT_OTHER): Payer: Managed Care, Other (non HMO)

## 2011-09-16 DIAGNOSIS — C19 Malignant neoplasm of rectosigmoid junction: Secondary | ICD-10-CM

## 2011-09-16 DIAGNOSIS — C20 Malignant neoplasm of rectum: Secondary | ICD-10-CM

## 2011-09-16 MED ORDER — HEPARIN SOD (PORK) LOCK FLUSH 100 UNIT/ML IV SOLN
500.0000 [IU] | Freq: Once | INTRAVENOUS | Status: AC | PRN
  Administered 2011-09-16: 500 [IU]
  Filled 2011-09-16: qty 5

## 2011-09-16 MED ORDER — SODIUM CHLORIDE 0.9 % IJ SOLN
10.0000 mL | INTRAMUSCULAR | Status: DC | PRN
  Administered 2011-09-16: 10 mL
  Filled 2011-09-16: qty 10

## 2011-09-16 MED ORDER — HEPARIN SOD (PORK) LOCK FLUSH 100 UNIT/ML IV SOLN
INTRAVENOUS | Status: AC
  Filled 2011-09-16: qty 5

## 2011-09-16 MED ORDER — SODIUM CHLORIDE 0.9 % IJ SOLN
INTRAMUSCULAR | Status: AC
  Filled 2011-09-16: qty 10

## 2011-09-16 NOTE — Progress Notes (Signed)
D/C home infusion pump. Flushed port per protocol.

## 2011-09-20 ENCOUNTER — Encounter (HOSPITAL_COMMUNITY): Payer: Managed Care, Other (non HMO) | Attending: Oncology

## 2011-09-20 DIAGNOSIS — C20 Malignant neoplasm of rectum: Secondary | ICD-10-CM | POA: Insufficient documentation

## 2011-09-20 DIAGNOSIS — C7800 Secondary malignant neoplasm of unspecified lung: Secondary | ICD-10-CM | POA: Insufficient documentation

## 2011-09-20 DIAGNOSIS — C19 Malignant neoplasm of rectosigmoid junction: Secondary | ICD-10-CM

## 2011-09-20 LAB — DIFFERENTIAL
Basophils Absolute: 0 10*3/uL (ref 0.0–0.1)
Basophils Relative: 0 % (ref 0–1)
Eosinophils Absolute: 0.1 10*3/uL (ref 0.0–0.7)
Monocytes Absolute: 0.6 10*3/uL (ref 0.1–1.0)
Monocytes Relative: 8 % (ref 3–12)

## 2011-09-20 LAB — CBC
HCT: 41.5 % (ref 39.0–52.0)
Hemoglobin: 13.9 g/dL (ref 13.0–17.0)
MCH: 32.2 pg (ref 26.0–34.0)
MCHC: 33.5 g/dL (ref 30.0–36.0)
RDW: 17.5 % — ABNORMAL HIGH (ref 11.5–15.5)

## 2011-09-20 LAB — COMPREHENSIVE METABOLIC PANEL
AST: 15 U/L (ref 0–37)
Albumin: 4.2 g/dL (ref 3.5–5.2)
BUN: 32 mg/dL — ABNORMAL HIGH (ref 6–23)
Calcium: 9.3 mg/dL (ref 8.4–10.5)
Creatinine, Ser: 1.65 mg/dL — ABNORMAL HIGH (ref 0.50–1.35)
Total Bilirubin: 0.4 mg/dL (ref 0.3–1.2)
Total Protein: 7.4 g/dL (ref 6.0–8.3)

## 2011-09-20 NOTE — Progress Notes (Signed)
Lab draw

## 2011-09-21 ENCOUNTER — Encounter (HOSPITAL_BASED_OUTPATIENT_CLINIC_OR_DEPARTMENT_OTHER): Payer: Managed Care, Other (non HMO)

## 2011-09-21 VITALS — BP 128/74 | HR 59 | Temp 98.0°F | Wt 216.8 lb

## 2011-09-21 DIAGNOSIS — C20 Malignant neoplasm of rectum: Secondary | ICD-10-CM

## 2011-09-21 DIAGNOSIS — C19 Malignant neoplasm of rectosigmoid junction: Secondary | ICD-10-CM

## 2011-09-21 DIAGNOSIS — C7800 Secondary malignant neoplasm of unspecified lung: Secondary | ICD-10-CM

## 2011-09-21 DIAGNOSIS — Z5111 Encounter for antineoplastic chemotherapy: Secondary | ICD-10-CM

## 2011-09-21 MED ORDER — CETUXIMAB CHEMO IV INJECTION 200 MG/100ML
250.0000 mg/m2 | Freq: Once | INTRAVENOUS | Status: AC
  Administered 2011-09-21: 600 mg via INTRAVENOUS
  Filled 2011-09-21: qty 300

## 2011-09-21 MED ORDER — HEPARIN SOD (PORK) LOCK FLUSH 100 UNIT/ML IV SOLN
500.0000 [IU] | Freq: Once | INTRAVENOUS | Status: AC
  Administered 2011-09-21: 500 [IU] via INTRAVENOUS
  Filled 2011-09-21: qty 5

## 2011-09-21 MED ORDER — DIPHENHYDRAMINE HCL 50 MG/ML IJ SOLN
INTRAMUSCULAR | Status: AC
  Filled 2011-09-21: qty 1

## 2011-09-21 MED ORDER — HEPARIN SOD (PORK) LOCK FLUSH 100 UNIT/ML IV SOLN
INTRAVENOUS | Status: AC
  Filled 2011-09-21: qty 5

## 2011-09-21 MED ORDER — SODIUM CHLORIDE 0.9 % IV SOLN
INTRAVENOUS | Status: DC
  Administered 2011-09-21: 09:00:00 via INTRAVENOUS

## 2011-09-21 MED ORDER — DIPHENHYDRAMINE HCL 50 MG/ML IJ SOLN
50.0000 mg | Freq: Once | INTRAMUSCULAR | Status: AC
  Administered 2011-09-21: 50 mg via INTRAVENOUS

## 2011-09-26 ENCOUNTER — Other Ambulatory Visit (HOSPITAL_COMMUNITY): Payer: Self-pay

## 2011-09-27 ENCOUNTER — Encounter (HOSPITAL_BASED_OUTPATIENT_CLINIC_OR_DEPARTMENT_OTHER): Payer: Managed Care, Other (non HMO)

## 2011-09-27 ENCOUNTER — Telehealth (HOSPITAL_COMMUNITY): Payer: Self-pay

## 2011-09-27 DIAGNOSIS — C7800 Secondary malignant neoplasm of unspecified lung: Secondary | ICD-10-CM

## 2011-09-27 DIAGNOSIS — C20 Malignant neoplasm of rectum: Secondary | ICD-10-CM

## 2011-09-27 LAB — DIFFERENTIAL
Basophils Absolute: 0.1 10*3/uL (ref 0.0–0.1)
Basophils Relative: 1 % (ref 0–1)
Neutro Abs: 5.6 10*3/uL (ref 1.7–7.7)
Neutrophils Relative %: 65 % (ref 43–77)

## 2011-09-27 LAB — CBC
MCHC: 33.9 g/dL (ref 30.0–36.0)
Platelets: 173 10*3/uL (ref 150–400)
RDW: 18.2 % — ABNORMAL HIGH (ref 11.5–15.5)

## 2011-09-27 LAB — COMPREHENSIVE METABOLIC PANEL
AST: 25 U/L (ref 0–37)
Albumin: 3.6 g/dL (ref 3.5–5.2)
Alkaline Phosphatase: 109 U/L (ref 39–117)
Chloride: 104 mEq/L (ref 96–112)
Potassium: 5 mEq/L (ref 3.5–5.1)
Total Bilirubin: 0.3 mg/dL (ref 0.3–1.2)

## 2011-09-27 NOTE — Progress Notes (Signed)
Labs drawn today for cbc/diff,cmp 

## 2011-09-27 NOTE — Telephone Encounter (Signed)
Message copied by Evelena Leyden on Mon Sep 27, 2011 10:38 AM ------      Message from: Ellouise Newer III      Created: Mon Sep 27, 2011 10:24 AM       Encouraged PO fluids

## 2011-09-27 NOTE — Telephone Encounter (Signed)
Message left on patient's voicemail to increase fluids and call back confirmation requested.

## 2011-09-28 ENCOUNTER — Encounter (HOSPITAL_BASED_OUTPATIENT_CLINIC_OR_DEPARTMENT_OTHER): Payer: Managed Care, Other (non HMO)

## 2011-09-28 VITALS — BP 123/73 | HR 73 | Temp 97.1°F | Wt 215.0 lb

## 2011-09-28 DIAGNOSIS — Z5112 Encounter for antineoplastic immunotherapy: Secondary | ICD-10-CM

## 2011-09-28 DIAGNOSIS — C20 Malignant neoplasm of rectum: Secondary | ICD-10-CM

## 2011-09-28 DIAGNOSIS — Z5111 Encounter for antineoplastic chemotherapy: Secondary | ICD-10-CM

## 2011-09-28 MED ORDER — SODIUM CHLORIDE 0.9 % IV SOLN: 8.0000 mg | Freq: Once | INTRAVENOUS | Status: DC

## 2011-09-28 MED ORDER — DEXAMETHASONE SODIUM PHOSPHATE 10 MG/ML IJ SOLN: 10.0000 mg | Freq: Once | INTRAMUSCULAR | Status: DC

## 2011-09-28 MED ORDER — DIPHENHYDRAMINE HCL 50 MG/ML IJ SOLN
INTRAMUSCULAR | Status: AC
  Filled 2011-09-28: qty 1

## 2011-09-28 MED ORDER — LEUCOVORIN CALCIUM INJECTION 350 MG: 400.0000 mg/m2 | Freq: Once | INTRAVENOUS | Status: DC

## 2011-09-28 MED ORDER — SODIUM CHLORIDE 0.9 % IV SOLN
Freq: Once | INTRAVENOUS | Status: AC
  Administered 2011-09-28: 8 mg via INTRAVENOUS
  Filled 2011-09-28: qty 4

## 2011-09-28 MED ORDER — DIPHENHYDRAMINE HCL 50 MG/ML IJ SOLN
50.0000 mg | Freq: Once | INTRAMUSCULAR | Status: AC
  Administered 2011-09-28: 50 mg via INTRAVENOUS

## 2011-09-28 MED ORDER — CETUXIMAB CHEMO IV INJECTION 200 MG/100ML
250.0000 mg/m2 | Freq: Once | INTRAVENOUS | Status: AC
  Administered 2011-09-28: 600 mg via INTRAVENOUS
  Filled 2011-09-28: qty 300

## 2011-09-28 MED ORDER — FLUOROURACIL CHEMO INJECTION 5 GM/100ML
2400.0000 mg/m2 | INTRAVENOUS | Status: DC
  Administered 2011-09-28: 5350 mg via INTRAVENOUS
  Filled 2011-09-28 (×2): qty 107

## 2011-09-28 MED ORDER — LEUCOVORIN CALCIUM INJECTION 100 MG
20.0000 mg/m2 | Freq: Once | INTRAMUSCULAR | Status: AC
  Administered 2011-09-28: 44 mg via INTRAVENOUS
  Filled 2011-09-28: qty 2.2

## 2011-09-28 MED ORDER — SODIUM CHLORIDE 0.9 % IV SOLN
Freq: Once | INTRAVENOUS | Status: AC
  Administered 2011-09-28: 10:00:00 via INTRAVENOUS

## 2011-09-28 MED ORDER — FLUOROURACIL CHEMO INJECTION 2.5 GM/50ML
400.0000 mg/m2 | Freq: Once | INTRAVENOUS | Status: AC
  Administered 2011-09-28: 900 mg via INTRAVENOUS
  Filled 2011-09-28: qty 18

## 2011-09-30 ENCOUNTER — Encounter (HOSPITAL_BASED_OUTPATIENT_CLINIC_OR_DEPARTMENT_OTHER): Payer: Managed Care, Other (non HMO)

## 2011-09-30 DIAGNOSIS — Z452 Encounter for adjustment and management of vascular access device: Secondary | ICD-10-CM

## 2011-09-30 DIAGNOSIS — C20 Malignant neoplasm of rectum: Secondary | ICD-10-CM

## 2011-09-30 MED ORDER — HEPARIN SOD (PORK) LOCK FLUSH 100 UNIT/ML IV SOLN
INTRAVENOUS | Status: AC
  Filled 2011-09-30: qty 5

## 2011-09-30 MED ORDER — HEPARIN SOD (PORK) LOCK FLUSH 100 UNIT/ML IV SOLN
500.0000 [IU] | Freq: Once | INTRAVENOUS | Status: AC | PRN
  Administered 2011-09-30: 500 [IU]
  Filled 2011-09-30: qty 5

## 2011-09-30 MED ORDER — SODIUM CHLORIDE 0.9 % IJ SOLN
INTRAMUSCULAR | Status: AC
  Filled 2011-09-30: qty 10

## 2011-09-30 MED ORDER — SODIUM CHLORIDE 0.9 % IJ SOLN
10.0000 mL | INTRAMUSCULAR | Status: DC | PRN
  Administered 2011-09-30: 10 mL
  Filled 2011-09-30: qty 10

## 2011-09-30 NOTE — Progress Notes (Signed)
D/C infusion pump. Flushed port per protocol.

## 2011-10-04 ENCOUNTER — Encounter (HOSPITAL_BASED_OUTPATIENT_CLINIC_OR_DEPARTMENT_OTHER): Payer: Managed Care, Other (non HMO)

## 2011-10-04 DIAGNOSIS — C20 Malignant neoplasm of rectum: Secondary | ICD-10-CM

## 2011-10-04 LAB — DIFFERENTIAL
Eosinophils Absolute: 0.1 10*3/uL (ref 0.0–0.7)
Eosinophils Relative: 2 % (ref 0–5)
Lymphocytes Relative: 36 % (ref 12–46)
Lymphs Abs: 2.4 10*3/uL (ref 0.7–4.0)
Monocytes Absolute: 0.3 10*3/uL (ref 0.1–1.0)
Monocytes Relative: 5 % (ref 3–12)

## 2011-10-04 LAB — CBC
Hemoglobin: 13 g/dL (ref 13.0–17.0)
MCH: 32.8 pg (ref 26.0–34.0)
MCHC: 34.3 g/dL (ref 30.0–36.0)
Platelets: 185 10*3/uL (ref 150–400)
RBC: 3.96 MIL/uL — ABNORMAL LOW (ref 4.22–5.81)

## 2011-10-04 LAB — COMPREHENSIVE METABOLIC PANEL
ALT: 21 U/L (ref 0–53)
AST: 15 U/L (ref 0–37)
Albumin: 3.6 g/dL (ref 3.5–5.2)
Alkaline Phosphatase: 87 U/L (ref 39–117)
Calcium: 8.3 mg/dL — ABNORMAL LOW (ref 8.4–10.5)
GFR calc Af Amer: 46 mL/min — ABNORMAL LOW (ref >90)
Glucose, Bld: 100 mg/dL — ABNORMAL HIGH (ref 70–99)
Potassium: 4.1 mEq/L (ref 3.5–5.1)
Sodium: 138 mEq/L (ref 135–145)
Total Protein: 6.4 g/dL (ref 6.0–8.3)

## 2011-10-04 NOTE — Progress Notes (Signed)
Labs drawn today for cbc/diff,cmp 

## 2011-10-05 ENCOUNTER — Encounter (HOSPITAL_BASED_OUTPATIENT_CLINIC_OR_DEPARTMENT_OTHER): Payer: Managed Care, Other (non HMO)

## 2011-10-05 VITALS — BP 130/76 | HR 69 | Temp 97.3°F | Wt 214.0 lb

## 2011-10-05 DIAGNOSIS — C20 Malignant neoplasm of rectum: Secondary | ICD-10-CM

## 2011-10-05 DIAGNOSIS — C7800 Secondary malignant neoplasm of unspecified lung: Secondary | ICD-10-CM

## 2011-10-05 DIAGNOSIS — Z5112 Encounter for antineoplastic immunotherapy: Secondary | ICD-10-CM

## 2011-10-05 MED ORDER — HEPARIN SOD (PORK) LOCK FLUSH 100 UNIT/ML IV SOLN
INTRAVENOUS | Status: AC
  Filled 2011-10-05: qty 5

## 2011-10-05 MED ORDER — HEPARIN SOD (PORK) LOCK FLUSH 100 UNIT/ML IV SOLN
500.0000 [IU] | Freq: Once | INTRAVENOUS | Status: AC | PRN
  Administered 2011-10-05: 500 [IU]
  Filled 2011-10-05: qty 5

## 2011-10-05 MED ORDER — SODIUM CHLORIDE 0.9 % IJ SOLN
10.0000 mL | INTRAMUSCULAR | Status: DC | PRN
  Administered 2011-10-05: 10 mL
  Filled 2011-10-05: qty 10

## 2011-10-05 MED ORDER — DIPHENHYDRAMINE HCL 50 MG/ML IJ SOLN: 50.0000 mg | Freq: Once | INTRAMUSCULAR | Status: DC

## 2011-10-05 MED ORDER — CETUXIMAB CHEMO IV INJECTION 200 MG/100ML
250.0000 mg/m2 | Freq: Once | INTRAVENOUS | Status: AC
  Administered 2011-10-05: 600 mg via INTRAVENOUS
  Filled 2011-10-05: qty 300

## 2011-10-05 MED ORDER — SODIUM CHLORIDE 0.9 % IV SOLN
Freq: Once | INTRAVENOUS | Status: AC
  Administered 2011-10-05: 10:00:00 via INTRAVENOUS

## 2011-10-05 NOTE — Progress Notes (Signed)
Tolerated chemo well. 

## 2011-10-11 ENCOUNTER — Encounter (HOSPITAL_BASED_OUTPATIENT_CLINIC_OR_DEPARTMENT_OTHER): Payer: Managed Care, Other (non HMO)

## 2011-10-11 DIAGNOSIS — C20 Malignant neoplasm of rectum: Secondary | ICD-10-CM

## 2011-10-11 LAB — COMPREHENSIVE METABOLIC PANEL
Albumin: 3.9 g/dL (ref 3.5–5.2)
BUN: 25 mg/dL — ABNORMAL HIGH (ref 6–23)
Calcium: 9.1 mg/dL (ref 8.4–10.5)
GFR calc Af Amer: 51 mL/min — ABNORMAL LOW (ref >90)
Glucose, Bld: 97 mg/dL (ref 70–99)
Sodium: 138 mEq/L (ref 135–145)
Total Protein: 7.1 g/dL (ref 6.0–8.3)

## 2011-10-11 LAB — CBC
MCV: 97.3 fL (ref 78.0–100.0)
Platelets: 198 10*3/uL (ref 150–400)
RBC: 4.07 MIL/uL — ABNORMAL LOW (ref 4.22–5.81)
WBC: 9.2 10*3/uL (ref 4.0–10.5)

## 2011-10-11 LAB — DIFFERENTIAL
Lymphocytes Relative: 21 % (ref 12–46)
Lymphs Abs: 1.9 10*3/uL (ref 0.7–4.0)
Monocytes Relative: 7 % (ref 3–12)
Neutro Abs: 6.2 10*3/uL (ref 1.7–7.7)
Neutrophils Relative %: 68 % (ref 43–77)

## 2011-10-11 NOTE — Progress Notes (Signed)
Labs drawn today for cbc/diff,cmp 

## 2011-10-12 ENCOUNTER — Encounter (HOSPITAL_BASED_OUTPATIENT_CLINIC_OR_DEPARTMENT_OTHER): Payer: Managed Care, Other (non HMO)

## 2011-10-12 VITALS — BP 127/78 | HR 62 | Temp 97.9°F | Wt 214.0 lb

## 2011-10-12 DIAGNOSIS — Z5112 Encounter for antineoplastic immunotherapy: Secondary | ICD-10-CM

## 2011-10-12 DIAGNOSIS — Z5111 Encounter for antineoplastic chemotherapy: Secondary | ICD-10-CM

## 2011-10-12 DIAGNOSIS — C7800 Secondary malignant neoplasm of unspecified lung: Secondary | ICD-10-CM

## 2011-10-12 DIAGNOSIS — C20 Malignant neoplasm of rectum: Secondary | ICD-10-CM

## 2011-10-12 MED ORDER — SODIUM CHLORIDE 0.9 % IJ SOLN
10.0000 mL | INTRAMUSCULAR | Status: DC | PRN
  Administered 2011-10-12: 10 mL
  Filled 2011-10-12: qty 10

## 2011-10-12 MED ORDER — DEXTROSE 5 % IV SOLN
Freq: Once | INTRAVENOUS | Status: DC
  Filled 2011-10-12: qty 1000

## 2011-10-12 MED ORDER — HEPARIN SOD (PORK) LOCK FLUSH 100 UNIT/ML IV SOLN
500.0000 [IU] | Freq: Once | INTRAVENOUS | Status: AC | PRN
  Administered 2011-10-12: 500 [IU]
  Filled 2011-10-12: qty 5

## 2011-10-12 MED ORDER — CETUXIMAB CHEMO IV INJECTION 200 MG/100ML
250.0000 mg/m2 | Freq: Once | INTRAVENOUS | Status: AC
  Administered 2011-10-12: 600 mg via INTRAVENOUS
  Filled 2011-10-12: qty 300

## 2011-10-12 MED ORDER — SODIUM CHLORIDE 0.9 % IV SOLN
2400.0000 mg/m2 | INTRAVENOUS | Status: DC
  Administered 2011-10-12: 5350 mg via INTRAVENOUS
  Filled 2011-10-12 (×2): qty 107

## 2011-10-12 MED ORDER — DEXAMETHASONE SODIUM PHOSPHATE 10 MG/ML IJ SOLN: 10.0000 mg | Freq: Once | INTRAMUSCULAR | Status: DC

## 2011-10-12 MED ORDER — SODIUM CHLORIDE 0.9 % IV SOLN
Freq: Once | INTRAVENOUS | Status: AC
  Administered 2011-10-12: 8 mg via INTRAVENOUS
  Filled 2011-10-12: qty 4

## 2011-10-12 MED ORDER — LEUCOVORIN CALCIUM INJECTION 100 MG
20.0000 mg/m2 | Freq: Once | INTRAMUSCULAR | Status: AC
  Administered 2011-10-12: 44 mg via INTRAVENOUS
  Filled 2011-10-12: qty 2.2

## 2011-10-12 MED ORDER — FLUOROURACIL CHEMO INJECTION 2.5 GM/50ML
400.0000 mg/m2 | Freq: Once | INTRAVENOUS | Status: AC
  Administered 2011-10-12: 900 mg via INTRAVENOUS
  Filled 2011-10-12: qty 18

## 2011-10-12 MED ORDER — SODIUM CHLORIDE 0.9 % IV SOLN
Freq: Once | INTRAVENOUS | Status: AC
  Administered 2011-10-12: 10:00:00 via INTRAVENOUS

## 2011-10-12 MED ORDER — LEUCOVORIN CALCIUM INJECTION 350 MG: 400.0000 mg/m2 | Freq: Once | INTRAVENOUS | Status: DC

## 2011-10-12 MED ORDER — SODIUM CHLORIDE 0.9 % IV SOLN: 8.0000 mg | Freq: Once | INTRAVENOUS | Status: DC

## 2011-10-12 NOTE — Progress Notes (Signed)
Tolerated chemo well. 

## 2011-10-14 ENCOUNTER — Encounter (HOSPITAL_BASED_OUTPATIENT_CLINIC_OR_DEPARTMENT_OTHER): Payer: Managed Care, Other (non HMO)

## 2011-10-14 DIAGNOSIS — C7800 Secondary malignant neoplasm of unspecified lung: Secondary | ICD-10-CM

## 2011-10-14 DIAGNOSIS — C19 Malignant neoplasm of rectosigmoid junction: Secondary | ICD-10-CM

## 2011-10-14 DIAGNOSIS — Z452 Encounter for adjustment and management of vascular access device: Secondary | ICD-10-CM

## 2011-10-14 DIAGNOSIS — C20 Malignant neoplasm of rectum: Secondary | ICD-10-CM

## 2011-10-14 MED ORDER — HEPARIN SOD (PORK) LOCK FLUSH 100 UNIT/ML IV SOLN
500.0000 [IU] | Freq: Once | INTRAVENOUS | Status: AC | PRN
  Administered 2011-10-14: 500 [IU]
  Filled 2011-10-14: qty 5

## 2011-10-14 MED ORDER — SODIUM CHLORIDE 0.9 % IJ SOLN
INTRAMUSCULAR | Status: AC
  Filled 2011-10-14: qty 10

## 2011-10-14 MED ORDER — SODIUM CHLORIDE 0.9 % IJ SOLN
10.0000 mL | INTRAMUSCULAR | Status: DC | PRN
  Administered 2011-10-14: 10 mL
  Filled 2011-10-14: qty 10

## 2011-10-14 MED ORDER — HEPARIN SOD (PORK) LOCK FLUSH 100 UNIT/ML IV SOLN
INTRAVENOUS | Status: AC
  Filled 2011-10-14: qty 5

## 2011-10-14 NOTE — Progress Notes (Signed)
Continuous infusion pump d/c, port flushed per protocol. Pt states that he would like to reschedule next weeks chemo appt until after he has CT scan and if possible he would like to take a break from chemotherapy.

## 2011-10-18 ENCOUNTER — Encounter (HOSPITAL_BASED_OUTPATIENT_CLINIC_OR_DEPARTMENT_OTHER): Payer: Managed Care, Other (non HMO)

## 2011-10-18 DIAGNOSIS — C19 Malignant neoplasm of rectosigmoid junction: Secondary | ICD-10-CM

## 2011-10-18 LAB — COMPREHENSIVE METABOLIC PANEL WITH GFR
ALT: 19 U/L (ref 0–53)
AST: 14 U/L (ref 0–37)
Albumin: 3.8 g/dL (ref 3.5–5.2)
Alkaline Phosphatase: 87 U/L (ref 39–117)
BUN: 34 mg/dL — ABNORMAL HIGH (ref 6–23)
CO2: 22 meq/L (ref 19–32)
Calcium: 8.9 mg/dL (ref 8.4–10.5)
Chloride: 105 meq/L (ref 96–112)
Creatinine, Ser: 1.8 mg/dL — ABNORMAL HIGH (ref 0.50–1.35)
GFR calc Af Amer: 48 mL/min — ABNORMAL LOW
GFR calc non Af Amer: 41 mL/min — ABNORMAL LOW
Glucose, Bld: 85 mg/dL (ref 70–99)
Potassium: 4.2 meq/L (ref 3.5–5.1)
Sodium: 139 meq/L (ref 135–145)
Total Bilirubin: 0.5 mg/dL (ref 0.3–1.2)
Total Protein: 6.9 g/dL (ref 6.0–8.3)

## 2011-10-18 LAB — CBC
HCT: 37.1 % — ABNORMAL LOW (ref 39.0–52.0)
Hemoglobin: 12.5 g/dL — ABNORMAL LOW (ref 13.0–17.0)
MCH: 32.7 pg (ref 26.0–34.0)
MCHC: 33.7 g/dL (ref 30.0–36.0)
MCV: 97.1 fL (ref 78.0–100.0)
Platelets: 178 K/uL (ref 150–400)
RBC: 3.82 MIL/uL — ABNORMAL LOW (ref 4.22–5.81)
RDW: 17.5 % — ABNORMAL HIGH (ref 11.5–15.5)
WBC: 7.5 K/uL (ref 4.0–10.5)

## 2011-10-18 LAB — DIFFERENTIAL
Basophils Absolute: 0 10*3/uL (ref 0.0–0.1)
Lymphocytes Relative: 38 % (ref 12–46)
Monocytes Absolute: 0.5 10*3/uL (ref 0.1–1.0)
Monocytes Relative: 6 % (ref 3–12)
Neutro Abs: 4 10*3/uL (ref 1.7–7.7)
Neutrophils Relative %: 53 % (ref 43–77)

## 2011-10-18 NOTE — Progress Notes (Signed)
Labs drawn today for cbc/diff,cmp 

## 2011-10-19 ENCOUNTER — Inpatient Hospital Stay (HOSPITAL_COMMUNITY): Payer: Managed Care, Other (non HMO)

## 2011-10-19 ENCOUNTER — Ambulatory Visit (HOSPITAL_COMMUNITY)
Admission: RE | Admit: 2011-10-19 | Discharge: 2011-10-19 | Disposition: A | Discharge status: Home / Self Care | Payer: Managed Care, Other (non HMO) | Source: Ambulatory Visit | Attending: Oncology | Admitting: Oncology

## 2011-10-19 DIAGNOSIS — Z9221 Personal history of antineoplastic chemotherapy: Secondary | ICD-10-CM | POA: Insufficient documentation

## 2011-10-19 DIAGNOSIS — C20 Malignant neoplasm of rectum: Secondary | ICD-10-CM | POA: Insufficient documentation

## 2011-10-19 MED ORDER — IOHEXOL 300 MG/ML  SOLN
100.0000 mL | Freq: Once | INTRAMUSCULAR | Status: AC | PRN
  Administered 2011-10-19: 100 mL via INTRAVENOUS

## 2011-10-20 ENCOUNTER — Encounter (HOSPITAL_BASED_OUTPATIENT_CLINIC_OR_DEPARTMENT_OTHER): Payer: Managed Care, Other (non HMO) | Admitting: Oncology

## 2011-10-20 ENCOUNTER — Encounter (HOSPITAL_COMMUNITY): Payer: Managed Care, Other (non HMO)

## 2011-10-20 ENCOUNTER — Encounter (HOSPITAL_COMMUNITY): Payer: Self-pay | Admitting: Oncology

## 2011-10-20 VITALS — BP 130/85 | HR 66 | Temp 98.1°F | Wt 212.2 lb

## 2011-10-20 DIAGNOSIS — C7800 Secondary malignant neoplasm of unspecified lung: Secondary | ICD-10-CM

## 2011-10-20 DIAGNOSIS — C20 Malignant neoplasm of rectum: Secondary | ICD-10-CM

## 2011-10-20 NOTE — Patient Instructions (Addendum)
Martin Maldonado  [id number] September 04, 1955 Dr. Glenford Peers   [facility]  Discharge Instructions  RECOMMENDATIONS MADE BY THE CONSULTANT AND ANY TEST RESULTS WILL BE SENT TO YOUR REFERRING DOCTOR.   EXAM FINDINGS BY MD TODAY AND SIGNS AND SYMPTOMS TO REPORT TO CLINIC OR PRIMARY MD: You scans have greatly improved and we will give you a break for 12 week.  Report any uncontrolled pain, nausea, vomiting, etc.  MEDICATIONS PRESCRIBED: none   INSTRUCTIONS GIVEN AND DISCUSSED: Other CT scans of chest, abdomen and pelvis in October [facility] Radiology Department  Instructions for CT Abdomen/Pelvis  Martin Maldonado, your exam is scheduled for Monday (10/28) at 9:30 am Check in at 9:15am.  **WARNING** IF YOU ARE ALLERGIC TO IODINE/X-RAY DYE, PLEASE NOTIFY us IMMEDIATELY.  YOU MAY NEED ADDITIONAL PRE-MEDICATIONS THE DAY PRIOR TO YOUR EXAM.   Please follow these instructions on the day of your exam.   Do not eat or drink anything after 7:30am.  (2 hours prior to the exam time)   You will be given two bottles of ReadiCat oral contrast solution to drink.  The solution may taste better if refrigerated, but do not add ice.  SHAKE WELL BEFORE DRINKING   Drink one bottle of ReadiCat (barium solution) at 7:30am.  (2 hours prior to your exam)   Drink one bottle of ReadiCat (barium solution) at 8:30am.  (1 hour prior to your exam)   You may take any medications as prescribed, with a small amount of water, if necessary.   Please arrive 30 minutes prior to appointment time to register.   Come in and report to Short Stay to register.   If registering in any area other than Radiology, notify the Radiology front office upon your arrival in the Radiology Department and they will notify the CT Department.  The purpose of you drinking the oral contrast solution (ReadiCat) is to aid in the visualization of your intestinal tract.  The contrast solution may cause  some diarrhea. Before your exam is started, you will be given a small amount of water to drink.  Depending on your individual set of symptoms, you may also receive an intravenous injection of x-ray contrast/iodine.  Plan on being in Radiology for 30-60 minutes or longer, depending on the type of exam you are having performed.  If you have any questions regarding your procedure, you may call the CT Department at [phone].    SPECIAL INSTRUCTIONS/FOLLOW-UP: Lab work Needed in October, Xray Studies Needed October and Return to Clinic for port flushes every 6 weeks and to see MD after scans in October.   I acknowledge that I have been informed and understand all the instructions given to me and received a copy. I do not have any more questions at this time, but understand that I may call the Specialty Clinic at [facility] at [phone] during business hours should I have any further questions or need assistance in obtaining follow-up care.    __________________________________________  _____________  __________ Signature of Patient or Authorized Representative            Date                   Time    __________________________________________ Nurse's Signature

## 2011-10-20 NOTE — Progress Notes (Signed)
Problem #1 metastatic recurrent rectal adenocarcinoma to lungs with multiple pulmonary nodules. This was discovered in February 2011 and biopsy proven in March of 2011 at the same time as emergency coronary artery bypass surgery. He had a 99% proximal left main obstructing lesion at the time of emergency surgery.  More recently he had progression in January of this year and has now completed 12 cycles of 5-FU leucovorin and cetuximab. His fingers are cracking around the nails his skin is red and very itchy on his face just back arms etc. His CT scan done yesterday has shown very nice improvement overall. He would like a break in therapy which is fine from my standpoint. His Port-A-Cath remains intact and his vital signs are stable other than a few pounds of weight loss. We will repeat a CT scan of the chest and abdomen with contrast in 12 weeks we'll do blood work in 12 weeks and see him back and decide what to do at that point in time.

## 2011-10-26 ENCOUNTER — Other Ambulatory Visit (HOSPITAL_COMMUNITY): Payer: Self-pay | Admitting: Oncology

## 2011-11-25 ENCOUNTER — Encounter (HOSPITAL_COMMUNITY): Payer: Managed Care, Other (non HMO) | Attending: Oncology

## 2011-11-25 DIAGNOSIS — C20 Malignant neoplasm of rectum: Secondary | ICD-10-CM | POA: Insufficient documentation

## 2011-11-25 DIAGNOSIS — Z452 Encounter for adjustment and management of vascular access device: Secondary | ICD-10-CM

## 2011-11-25 DIAGNOSIS — C7800 Secondary malignant neoplasm of unspecified lung: Secondary | ICD-10-CM

## 2011-11-25 MED ORDER — SODIUM CHLORIDE 0.9 % IJ SOLN
20.0000 mL | INTRAMUSCULAR | Status: DC | PRN
  Administered 2011-11-25: 20 mL via INTRAVENOUS
  Filled 2011-11-25: qty 20

## 2011-11-25 MED ORDER — HEPARIN SOD (PORK) LOCK FLUSH 100 UNIT/ML IV SOLN
INTRAVENOUS | Status: AC
  Filled 2011-11-25: qty 5

## 2011-11-25 MED ORDER — SODIUM CHLORIDE 0.9 % IJ SOLN
INTRAMUSCULAR | Status: AC
  Filled 2011-11-25: qty 10

## 2011-11-25 MED ORDER — HEPARIN SOD (PORK) LOCK FLUSH 100 UNIT/ML IV SOLN
500.0000 [IU] | Freq: Once | INTRAVENOUS | Status: AC
  Administered 2011-11-25: 500 [IU] via INTRAVENOUS
  Filled 2011-11-25: qty 5

## 2011-11-25 NOTE — Progress Notes (Signed)
Martin Maldonado presented for Portacath access and flush. Proper placement of portacath confirmed by CXR. Portacath located left chest wall accessed with  H 20 needle. Good blood return present. Portacath flushed with 20ml NS and 500U/71ml Heparin and needle removed intact. Procedure without incident. Patient tolerated procedure well.

## 2012-01-04 ENCOUNTER — Encounter: Payer: Self-pay | Admitting: Internal Medicine

## 2012-01-06 ENCOUNTER — Encounter (HOSPITAL_COMMUNITY): Payer: Managed Care, Other (non HMO) | Attending: Oncology

## 2012-01-06 DIAGNOSIS — Z452 Encounter for adjustment and management of vascular access device: Secondary | ICD-10-CM

## 2012-01-06 DIAGNOSIS — C7800 Secondary malignant neoplasm of unspecified lung: Secondary | ICD-10-CM | POA: Insufficient documentation

## 2012-01-06 DIAGNOSIS — C20 Malignant neoplasm of rectum: Secondary | ICD-10-CM | POA: Insufficient documentation

## 2012-01-06 LAB — COMPREHENSIVE METABOLIC PANEL
ALT: 15 U/L (ref 0–53)
AST: 20 U/L (ref 0–37)
Albumin: 4 g/dL (ref 3.5–5.2)
Alkaline Phosphatase: 103 U/L (ref 39–117)
BUN: 14 mg/dL (ref 6–23)
CO2: 24 mEq/L (ref 19–32)
Calcium: 9.9 mg/dL (ref 8.4–10.5)
Chloride: 105 mEq/L (ref 96–112)
Creatinine, Ser: 1.42 mg/dL — ABNORMAL HIGH (ref 0.50–1.35)
GFR calc Af Amer: 63 mL/min — ABNORMAL LOW (ref >90)
GFR calc non Af Amer: 55 mL/min — ABNORMAL LOW (ref >90)
Glucose, Bld: 97 mg/dL (ref 70–99)
Potassium: 3.6 mEq/L (ref 3.5–5.1)
Sodium: 140 mEq/L (ref 135–145)
Total Bilirubin: 0.2 mg/dL — ABNORMAL LOW (ref 0.3–1.2)
Total Protein: 7.5 g/dL (ref 6.0–8.3)

## 2012-01-06 LAB — CBC WITH DIFFERENTIAL/PLATELET
Basophils Absolute: 0.1 10*3/uL (ref 0.0–0.1)
Basophils Relative: 1 % (ref 0–1)
Eosinophils Absolute: 0.4 10*3/uL (ref 0.0–0.7)
Eosinophils Relative: 5 % (ref 0–5)
HCT: 41.4 % (ref 39.0–52.0)
Hemoglobin: 14.1 g/dL (ref 13.0–17.0)
Lymphocytes Relative: 21 % (ref 12–46)
Lymphs Abs: 1.8 10*3/uL (ref 0.7–4.0)
MCH: 30.9 pg (ref 26.0–34.0)
MCHC: 34.1 g/dL (ref 30.0–36.0)
MCV: 90.6 fL (ref 78.0–100.0)
Monocytes Absolute: 0.6 10*3/uL (ref 0.1–1.0)
Monocytes Relative: 7 % (ref 3–12)
Neutro Abs: 5.5 10*3/uL (ref 1.7–7.7)
Neutrophils Relative %: 65 % (ref 43–77)
Platelets: 300 10*3/uL (ref 150–400)
RBC: 4.57 MIL/uL (ref 4.22–5.81)
RDW: 13.6 % (ref 11.5–15.5)
WBC: 8.5 10*3/uL (ref 4.0–10.5)

## 2012-01-06 MED ORDER — SODIUM CHLORIDE 0.9 % IJ SOLN
10.0000 mL | INTRAMUSCULAR | Status: DC | PRN
  Administered 2012-01-06: 10 mL via INTRAVENOUS
  Filled 2012-01-06: qty 10

## 2012-01-06 MED ORDER — HEPARIN SOD (PORK) LOCK FLUSH 100 UNIT/ML IV SOLN
500.0000 [IU] | Freq: Once | INTRAVENOUS | Status: AC
  Administered 2012-01-06: 500 [IU] via INTRAVENOUS
  Filled 2012-01-06: qty 5

## 2012-01-06 NOTE — Progress Notes (Signed)
Martin Maldonado presented for Portacath access and flush. Proper placement of portacath confirmed by CXR. Portacath located right  chest wall accessed with  H 20 needle. Good blood return present. Portacath flushed with 20ml NS and 500U/5ml Heparin and needle removed intact. Procedure without incident. Patient tolerated procedure well.   

## 2012-01-07 LAB — CEA: CEA: 6.1 ng/mL — ABNORMAL HIGH (ref 0.0–5.0)

## 2012-01-17 ENCOUNTER — Ambulatory Visit (HOSPITAL_COMMUNITY)
Admission: RE | Admit: 2012-01-17 | Discharge: 2012-01-17 | Disposition: A | Discharge status: Home / Self Care | Payer: Managed Care, Other (non HMO) | Source: Ambulatory Visit | Attending: Oncology | Admitting: Oncology

## 2012-01-17 DIAGNOSIS — Z933 Colostomy status: Secondary | ICD-10-CM | POA: Insufficient documentation

## 2012-01-17 DIAGNOSIS — C20 Malignant neoplasm of rectum: Secondary | ICD-10-CM | POA: Insufficient documentation

## 2012-01-17 DIAGNOSIS — C7800 Secondary malignant neoplasm of unspecified lung: Secondary | ICD-10-CM | POA: Insufficient documentation

## 2012-01-17 DIAGNOSIS — K8020 Calculus of gallbladder without cholecystitis without obstruction: Secondary | ICD-10-CM | POA: Insufficient documentation

## 2012-01-17 MED ORDER — IOHEXOL 300 MG/ML  SOLN
100.0000 mL | Freq: Once | INTRAMUSCULAR | Status: AC | PRN
  Administered 2012-01-17: 100 mL via INTRAVENOUS

## 2012-01-19 ENCOUNTER — Encounter (HOSPITAL_BASED_OUTPATIENT_CLINIC_OR_DEPARTMENT_OTHER): Payer: Managed Care, Other (non HMO) | Admitting: Oncology

## 2012-01-19 VITALS — BP 188/95 | HR 88 | Temp 98.1°F | Resp 20 | Wt 212.0 lb

## 2012-01-19 DIAGNOSIS — C7800 Secondary malignant neoplasm of unspecified lung: Secondary | ICD-10-CM

## 2012-01-19 DIAGNOSIS — C20 Malignant neoplasm of rectum: Secondary | ICD-10-CM

## 2012-01-19 MED ORDER — CLINDAMYCIN PHOSPHATE 1 % EX GEL: Freq: Two times a day (BID) | CUTANEOUS | Status: DC

## 2012-01-21 NOTE — Progress Notes (Signed)
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 He has multiple new lesions and progression of old lesions with a worsening left effusion. His lungs remain clear except for minimal dullness only at the very base of the left lung. He has no lymphadenopathy. Vital signs are stable. Heart shows a regular rhythm and rate. Port-A-Cath is intact. He needs reinitiation of chemotherapy and we will try the same drugs as before. He still has significant neuropathic sensations in his feet but his hands are much improved. If he does not respond to these drugs we'll add oxaliplatinum to the regimen. He is asymptomatic from a pulmonary standpoint and oncologic standpoint as well.

## 2012-01-24 ENCOUNTER — Telehealth (HOSPITAL_COMMUNITY): Payer: Self-pay | Admitting: Oncology

## 2012-01-24 ENCOUNTER — Encounter (HOSPITAL_COMMUNITY): Payer: Managed Care, Other (non HMO) | Attending: Oncology

## 2012-01-24 VITALS — BP 151/87 | HR 76 | Temp 97.8°F | Resp 18 | Wt 210.0 lb

## 2012-01-24 DIAGNOSIS — Z5112 Encounter for antineoplastic immunotherapy: Secondary | ICD-10-CM

## 2012-01-24 DIAGNOSIS — C7800 Secondary malignant neoplasm of unspecified lung: Secondary | ICD-10-CM

## 2012-01-24 DIAGNOSIS — Z5111 Encounter for antineoplastic chemotherapy: Secondary | ICD-10-CM

## 2012-01-24 DIAGNOSIS — C20 Malignant neoplasm of rectum: Secondary | ICD-10-CM

## 2012-01-24 LAB — CBC WITH DIFFERENTIAL/PLATELET
Basophils Absolute: 0.1 10*3/uL (ref 0.0–0.1)
HCT: 42.1 % (ref 39.0–52.0)
Lymphocytes Relative: 16 % (ref 12–46)
Monocytes Absolute: 0.6 10*3/uL (ref 0.1–1.0)
Neutro Abs: 6.8 10*3/uL (ref 1.7–7.7)
Platelets: 346 10*3/uL (ref 150–400)
RDW: 13.5 % (ref 11.5–15.5)
WBC: 9.4 10*3/uL (ref 4.0–10.5)

## 2012-01-24 LAB — COMPREHENSIVE METABOLIC PANEL
ALT: 10 U/L (ref 0–53)
AST: 17 U/L (ref 0–37)
Alkaline Phosphatase: 98 U/L (ref 39–117)
CO2: 23 mEq/L (ref 19–32)
Chloride: 103 mEq/L (ref 96–112)
GFR calc non Af Amer: 60 mL/min — ABNORMAL LOW (ref >90)
Potassium: 3.7 mEq/L (ref 3.5–5.1)
Sodium: 137 mEq/L (ref 135–145)
Total Bilirubin: 0.2 mg/dL — ABNORMAL LOW (ref 0.3–1.2)

## 2012-01-24 MED ORDER — SODIUM CHLORIDE 0.9 % IV SOLN
Freq: Once | INTRAVENOUS | Status: AC
  Administered 2012-01-24: 10:00:00 via INTRAVENOUS

## 2012-01-24 MED ORDER — PROCHLORPERAZINE EDISYLATE 5 MG/ML IJ SOLN: 10.0000 mg | Freq: Once | INTRAMUSCULAR | Status: DC

## 2012-01-24 MED ORDER — SODIUM CHLORIDE 0.9 % IJ SOLN
10.0000 mL | INTRAMUSCULAR | Status: DC | PRN
  Filled 2012-01-24: qty 10

## 2012-01-24 MED ORDER — SODIUM CHLORIDE 0.9 % IV SOLN
2400.0000 mg/m2 | INTRAVENOUS | Status: DC
  Administered 2012-01-24: 5300 mg via INTRAVENOUS
  Filled 2012-01-24 (×2): qty 106

## 2012-01-24 MED ORDER — SODIUM CHLORIDE 0.9 % IJ SOLN
INTRAMUSCULAR | Status: AC
  Filled 2012-01-24: qty 20

## 2012-01-24 MED ORDER — LEUCOVORIN CALCIUM INJECTION 100 MG
20.0000 mg/m2 | Freq: Once | INTRAMUSCULAR | Status: AC
  Administered 2012-01-24: 44 mg via INTRAVENOUS
  Filled 2012-01-24: qty 2.2

## 2012-01-24 MED ORDER — HEPARIN SOD (PORK) LOCK FLUSH 100 UNIT/ML IV SOLN
500.0000 [IU] | Freq: Once | INTRAVENOUS | Status: DC | PRN
  Filled 2012-01-24: qty 5

## 2012-01-24 MED ORDER — LEUCOVORIN CALCIUM INJECTION 350 MG: 400.0000 mg/m2 | Freq: Once | INTRAVENOUS | Status: DC

## 2012-01-24 MED ORDER — FLUOROURACIL CHEMO INJECTION 2.5 GM/50ML
400.0000 mg/m2 | Freq: Once | INTRAVENOUS | Status: AC
  Administered 2012-01-24: 900 mg via INTRAVENOUS
  Filled 2012-01-24: qty 18

## 2012-01-24 MED ORDER — CETUXIMAB CHEMO IV INJECTION 200 MG/100ML
400.0000 mg/m2 | Freq: Once | INTRAVENOUS | Status: AC
  Administered 2012-01-24: 900 mg via INTRAVENOUS
  Filled 2012-01-24: qty 450

## 2012-01-24 MED ORDER — DIPHENHYDRAMINE HCL 50 MG/ML IJ SOLN
INTRAMUSCULAR | Status: AC
  Filled 2012-01-24: qty 1

## 2012-01-24 MED ORDER — SODIUM CHLORIDE 0.9 % IV SOLN
8.0000 mg | Freq: Once | INTRAVENOUS | Status: AC
  Administered 2012-01-24: 8 mg via INTRAVENOUS
  Filled 2012-01-24: qty 4

## 2012-01-24 MED ORDER — DIPHENHYDRAMINE HCL 50 MG/ML IJ SOLN
50.0000 mg | Freq: Once | INTRAMUSCULAR | Status: AC
  Administered 2012-01-24: 50 mg via INTRAVENOUS

## 2012-01-24 NOTE — Telephone Encounter (Signed)
[employer] [phone] ?'D IF CPT [id number] REQUIRE AUTH. PER JENNIFER NO AUTH IS REQUIRED AND SHE WILL FAX OVER LTR STATING NO AUTH IS REQRD.

## 2012-01-24 NOTE — Progress Notes (Signed)
Tolerated well

## 2012-01-26 ENCOUNTER — Encounter (HOSPITAL_BASED_OUTPATIENT_CLINIC_OR_DEPARTMENT_OTHER): Payer: Managed Care, Other (non HMO)

## 2012-01-26 DIAGNOSIS — C7800 Secondary malignant neoplasm of unspecified lung: Secondary | ICD-10-CM

## 2012-01-26 DIAGNOSIS — C20 Malignant neoplasm of rectum: Secondary | ICD-10-CM

## 2012-01-26 DIAGNOSIS — Z452 Encounter for adjustment and management of vascular access device: Secondary | ICD-10-CM

## 2012-01-26 MED ORDER — PROMETHAZINE HCL 25 MG PO TABS: 25.0000 mg | ORAL_TABLET | Freq: Four times a day (QID) | ORAL | Status: DC | PRN

## 2012-01-26 MED ORDER — SODIUM CHLORIDE 0.9 % IJ SOLN
10.0000 mL | INTRAMUSCULAR | Status: DC | PRN
  Administered 2012-01-26: 10 mL via INTRAVENOUS
  Filled 2012-01-26: qty 10

## 2012-01-26 MED ORDER — HEPARIN SOD (PORK) LOCK FLUSH 100 UNIT/ML IV SOLN
500.0000 [IU] | Freq: Once | INTRAVENOUS | Status: AC
  Administered 2012-01-26: 500 [IU] via INTRAVENOUS
  Filled 2012-01-26: qty 5

## 2012-01-26 MED ORDER — HEPARIN SOD (PORK) LOCK FLUSH 100 UNIT/ML IV SOLN
INTRAVENOUS | Status: AC
  Filled 2012-01-26: qty 5

## 2012-01-26 MED ORDER — SODIUM CHLORIDE 0.9 % IJ SOLN
INTRAMUSCULAR | Status: AC
  Filled 2012-01-26: qty 10

## 2012-01-26 NOTE — Progress Notes (Signed)
Martin Maldonado presented to have his home infusion pump d/c'd - pt reports mild nausea after chemo tx but otherwise tolerated well.  Prescription for phenergan called in to [facility].

## 2012-01-27 ENCOUNTER — Encounter (HOSPITAL_COMMUNITY): Payer: Managed Care, Other (non HMO)

## 2012-02-01 ENCOUNTER — Inpatient Hospital Stay (HOSPITAL_COMMUNITY): Payer: Managed Care, Other (non HMO)

## 2012-02-01 ENCOUNTER — Encounter (HOSPITAL_BASED_OUTPATIENT_CLINIC_OR_DEPARTMENT_OTHER): Payer: Managed Care, Other (non HMO)

## 2012-02-01 VITALS — BP 157/85 | HR 75 | Temp 98.4°F | Resp 16 | Wt 210.4 lb

## 2012-02-01 DIAGNOSIS — C7800 Secondary malignant neoplasm of unspecified lung: Secondary | ICD-10-CM

## 2012-02-01 DIAGNOSIS — C20 Malignant neoplasm of rectum: Secondary | ICD-10-CM

## 2012-02-01 DIAGNOSIS — Z5112 Encounter for antineoplastic immunotherapy: Secondary | ICD-10-CM

## 2012-02-01 MED ORDER — HEPARIN SOD (PORK) LOCK FLUSH 100 UNIT/ML IV SOLN
500.0000 [IU] | Freq: Once | INTRAVENOUS | Status: AC | PRN
  Administered 2012-02-01: 500 [IU]
  Filled 2012-02-01: qty 5

## 2012-02-01 MED ORDER — CETUXIMAB CHEMO IV INJECTION 200 MG/100ML
250.0000 mg/m2 | Freq: Once | INTRAVENOUS | Status: AC
  Administered 2012-02-01: 600 mg via INTRAVENOUS
  Filled 2012-02-01: qty 300

## 2012-02-01 MED ORDER — DIPHENHYDRAMINE HCL 50 MG/ML IJ SOLN
50.0000 mg | Freq: Once | INTRAMUSCULAR | Status: AC
  Administered 2012-02-01: 50 mg via INTRAVENOUS

## 2012-02-01 MED ORDER — SODIUM CHLORIDE 0.9 % IJ SOLN
10.0000 mL | INTRAMUSCULAR | Status: DC | PRN
  Administered 2012-02-01: 10 mL
  Filled 2012-02-01: qty 10

## 2012-02-01 MED ORDER — HEPARIN SOD (PORK) LOCK FLUSH 100 UNIT/ML IV SOLN
INTRAVENOUS | Status: AC
  Filled 2012-02-01: qty 5

## 2012-02-01 MED ORDER — SODIUM CHLORIDE 0.9 % IV SOLN
Freq: Once | INTRAVENOUS | Status: AC
  Administered 2012-02-01: 14:00:00 via INTRAVENOUS

## 2012-02-01 MED ORDER — DIPHENHYDRAMINE HCL 50 MG/ML IJ SOLN
INTRAMUSCULAR | Status: AC
  Filled 2012-02-01: qty 1

## 2012-02-01 NOTE — Progress Notes (Signed)
Tolerated erbitux well.

## 2012-02-07 ENCOUNTER — Other Ambulatory Visit: Payer: Self-pay | Admitting: Cardiology

## 2012-02-07 ENCOUNTER — Encounter (HOSPITAL_BASED_OUTPATIENT_CLINIC_OR_DEPARTMENT_OTHER): Payer: Managed Care, Other (non HMO)

## 2012-02-07 DIAGNOSIS — C20 Malignant neoplasm of rectum: Secondary | ICD-10-CM

## 2012-02-07 DIAGNOSIS — C7800 Secondary malignant neoplasm of unspecified lung: Secondary | ICD-10-CM

## 2012-02-07 LAB — COMPREHENSIVE METABOLIC PANEL
AST: 21 U/L (ref 0–37)
Albumin: 3.7 g/dL (ref 3.5–5.2)
Alkaline Phosphatase: 127 U/L — ABNORMAL HIGH (ref 39–117)
CO2: 21 mEq/L (ref 19–32)
Chloride: 104 mEq/L (ref 96–112)
Creatinine, Ser: 1.83 mg/dL — ABNORMAL HIGH (ref 0.50–1.35)
GFR calc non Af Amer: 40 mL/min — ABNORMAL LOW (ref >90)
Potassium: 3.8 mEq/L (ref 3.5–5.1)
Total Bilirubin: 0.2 mg/dL — ABNORMAL LOW (ref 0.3–1.2)

## 2012-02-07 LAB — CBC WITH DIFFERENTIAL/PLATELET
Basophils Absolute: 0.1 10*3/uL (ref 0.0–0.1)
HCT: 42.4 % (ref 39.0–52.0)
Hemoglobin: 14.3 g/dL (ref 13.0–17.0)
Lymphocytes Relative: 16 % (ref 12–46)
Monocytes Absolute: 0.7 10*3/uL (ref 0.1–1.0)
Monocytes Relative: 7 % (ref 3–12)
Neutro Abs: 7.6 10*3/uL (ref 1.7–7.7)
Neutrophils Relative %: 72 % (ref 43–77)
WBC: 10.5 10*3/uL (ref 4.0–10.5)

## 2012-02-07 NOTE — Progress Notes (Signed)
Labs drawn today for cbc/diff,cmp 

## 2012-02-08 ENCOUNTER — Encounter (HOSPITAL_BASED_OUTPATIENT_CLINIC_OR_DEPARTMENT_OTHER): Payer: Managed Care, Other (non HMO)

## 2012-02-08 VITALS — BP 160/80 | HR 69 | Temp 98.2°F | Resp 18 | Wt 210.0 lb

## 2012-02-08 DIAGNOSIS — Z5111 Encounter for antineoplastic chemotherapy: Secondary | ICD-10-CM

## 2012-02-08 DIAGNOSIS — C7800 Secondary malignant neoplasm of unspecified lung: Secondary | ICD-10-CM

## 2012-02-08 DIAGNOSIS — C20 Malignant neoplasm of rectum: Secondary | ICD-10-CM

## 2012-02-08 DIAGNOSIS — Z5112 Encounter for antineoplastic immunotherapy: Secondary | ICD-10-CM

## 2012-02-08 MED ORDER — DIPHENHYDRAMINE HCL 50 MG/ML IJ SOLN
50.0000 mg | Freq: Once | INTRAMUSCULAR | Status: AC
  Administered 2012-02-08: 50 mg via INTRAVENOUS

## 2012-02-08 MED ORDER — SODIUM CHLORIDE 0.9 % IV SOLN
8.0000 mg | Freq: Once | INTRAVENOUS | Status: AC
  Administered 2012-02-08: 8 mg via INTRAVENOUS
  Filled 2012-02-08: qty 4

## 2012-02-08 MED ORDER — LEUCOVORIN CALCIUM INJECTION 350 MG: 400.0000 mg/m2 | Freq: Once | INTRAVENOUS | Status: DC

## 2012-02-08 MED ORDER — FLUOROURACIL CHEMO INJECTION 2.5 GM/50ML
400.0000 mg/m2 | Freq: Once | INTRAVENOUS | Status: AC
  Administered 2012-02-08: 900 mg via INTRAVENOUS
  Filled 2012-02-08: qty 18

## 2012-02-08 MED ORDER — SODIUM CHLORIDE 0.9 % IV SOLN
Freq: Once | INTRAVENOUS | Status: AC
  Administered 2012-02-08: 09:00:00 via INTRAVENOUS

## 2012-02-08 MED ORDER — SODIUM CHLORIDE 0.9 % IJ SOLN
10.0000 mL | INTRAMUSCULAR | Status: DC | PRN
  Filled 2012-02-08: qty 10

## 2012-02-08 MED ORDER — DIPHENHYDRAMINE HCL 50 MG/ML IJ SOLN
INTRAMUSCULAR | Status: AC
  Filled 2012-02-08: qty 1

## 2012-02-08 MED ORDER — LEUCOVORIN CALCIUM INJECTION 100 MG
20.0000 mg/m2 | Freq: Once | INTRAMUSCULAR | Status: AC
  Administered 2012-02-08: 44 mg via INTRAVENOUS
  Filled 2012-02-08: qty 2.2

## 2012-02-08 MED ORDER — HEPARIN SOD (PORK) LOCK FLUSH 100 UNIT/ML IV SOLN
500.0000 [IU] | Freq: Once | INTRAVENOUS | Status: DC | PRN
  Filled 2012-02-08: qty 5

## 2012-02-08 MED ORDER — CETUXIMAB CHEMO IV INJECTION 200 MG/100ML
250.0000 mg/m2 | Freq: Once | INTRAVENOUS | Status: AC
  Administered 2012-02-08: 600 mg via INTRAVENOUS
  Filled 2012-02-08: qty 300

## 2012-02-08 MED ORDER — SODIUM CHLORIDE 0.9 % IV SOLN
2400.0000 mg/m2 | INTRAVENOUS | Status: DC
  Administered 2012-02-08: 5300 mg via INTRAVENOUS
  Filled 2012-02-08 (×2): qty 106

## 2012-02-08 NOTE — Progress Notes (Signed)
Tolerated well

## 2012-02-10 ENCOUNTER — Encounter (HOSPITAL_BASED_OUTPATIENT_CLINIC_OR_DEPARTMENT_OTHER): Payer: Managed Care, Other (non HMO)

## 2012-02-10 DIAGNOSIS — C20 Malignant neoplasm of rectum: Secondary | ICD-10-CM

## 2012-02-10 MED ORDER — SODIUM CHLORIDE 0.9 % IJ SOLN
10.0000 mL | INTRAMUSCULAR | Status: DC | PRN
  Administered 2012-02-10: 10 mL
  Filled 2012-02-10: qty 10

## 2012-02-10 MED ORDER — HEPARIN SOD (PORK) LOCK FLUSH 100 UNIT/ML IV SOLN
INTRAVENOUS | Status: AC
  Filled 2012-02-10: qty 5

## 2012-02-10 MED ORDER — HEPARIN SOD (PORK) LOCK FLUSH 100 UNIT/ML IV SOLN
500.0000 [IU] | Freq: Once | INTRAVENOUS | Status: AC | PRN
  Administered 2012-02-10: 500 [IU]
  Filled 2012-02-10: qty 5

## 2012-02-10 MED ORDER — SODIUM CHLORIDE 0.9 % IJ SOLN
INTRAMUSCULAR | Status: AC
  Filled 2012-02-10: qty 20

## 2012-02-10 NOTE — Progress Notes (Signed)
Martin Maldonado presented for Portacath access and flush. Proper placement of portacath confirmed by CXR. Portacath located lt chest wall deaccessed and pump d/ced after 46 hr 7fu infusion complete.  Good blood return present. Portacath flushed with 20ml NS and 500U/24ml Heparin and needle removed intact. Procedure without incident. Patient tolerated procedure well.

## 2012-02-14 ENCOUNTER — Encounter: Payer: Self-pay | Admitting: Adult Health

## 2012-02-14 ENCOUNTER — Ambulatory Visit (INDEPENDENT_AMBULATORY_CARE_PROVIDER_SITE_OTHER): Payer: Managed Care, Other (non HMO) | Admitting: Adult Health

## 2012-02-14 VITALS — BP 138/82 | HR 72 | Ht 73.0 in | Wt 209.0 lb

## 2012-02-14 DIAGNOSIS — I10 Essential (primary) hypertension: Secondary | ICD-10-CM

## 2012-02-14 DIAGNOSIS — I2510 Atherosclerotic heart disease of native coronary artery without angina pectoris: Secondary | ICD-10-CM

## 2012-02-14 DIAGNOSIS — E785 Hyperlipidemia, unspecified: Secondary | ICD-10-CM

## 2012-02-14 MED ORDER — METOPROLOL TARTRATE 50 MG PO TABS: 50.0000 mg | ORAL_TABLET | Freq: Two times a day (BID) | ORAL | Status: DC

## 2012-02-14 NOTE — Assessment & Plan Note (Signed)
Essentially well controlled. Some elevations are noted on the graph, which occur during chemotherapy. Will not make any changes on his medications at this time.  I will, howver, have an echocardiogram completed for LV fx with extensive chemo therapy for ongoing assessment. He can get this done when he comes again for labs next week. Refills on lopressor are provided.

## 2012-02-14 NOTE — Patient Instructions (Addendum)
Your physician recommends that you schedule a follow-up appointment in: ONE YEAR KL/RR  Your physician has requested that you have an echocardiogram. Echocardiography is a painless test that uses sound waves to create images of your heart. It provides your doctor with information about the size and shape of your heart and how well your heart's chambers and valves are working. This procedure takes approximately one hour. There are no restrictions for this procedure.  Your physician recommends that you continue on your current medications as directed. Please refer to the Current Medication list given to you today. Your prescription for Lopressor has been sent to the pharmacy

## 2012-02-14 NOTE — Addendum Note (Signed)
Addended by: Derry Lory A on: 02/14/2012 02:42 PM   Modules accepted: Orders

## 2012-02-14 NOTE — Progress Notes (Deleted)
Name: Martin Maldonado    DOB: 09-12-1955  Age: 56 y.o.  MR#: [id number]       PCP:  Kirstie Peri, MD      Insurance: @PAYORNAME @   CC:   No chief complaint on file.   VS BP 138/82  Pulse 72  Ht 6\' 1"  (1.854 m)  Wt 209 lb (94.802 kg)  BMI 27.57 kg/m2  Weights Current Weight  02/14/12 209 lb (94.802 kg)  02/08/12 210 lb (95.255 kg)  02/01/12 210 lb 6.4 oz (95.437 kg)    Blood Pressure  BP Readings from Last 3 Encounters:  02/14/12 138/82  02/08/12 160/80  02/01/12 157/85     Admit date:  (Not on file) Last encounter with RMR:  Visit date not found   Allergy No Known Allergies  Current Outpatient Prescriptions  Medication Sig Dispense Refill  . aspirin 81 MG tablet Take 81 mg by mouth daily.        . Cetuximab (ERBITUX IV) Inject into the vein once a week.      . clindamycin (CLINDAGEL) 1 % gel Apply topically 2 (two) times daily.  30 g  4  . dexamethasone (DECADRON) 4 MG tablet Take 2 tablets PO starting the day after chemo for 2 days. Take with food. Take the day after the 27fu pump has been removed x 2 days  30 tablet  1  . fluorouracil (ADRUCIL) 2.5 GM/50ML SOLN Inject into the vein once. Pt to receive this q14 days       . lisinopril (PRINIVIL,ZESTRIL) 20 MG tablet Take 1 tablet (20 mg total) by mouth daily.  30 tablet  12  . LORazepam (ATIVAN) 1 MG tablet take 1 tablet by mouth every 4 hours if needed for nausea  30 tablet  2  . metoprolol (LOPRESSOR) 50 MG tablet take 1 tablet by mouth twice a day  30 tablet  1  . multivitamin (THERAGRAN) per tablet Take 1 tablet by mouth daily.        . ondansetron (ZOFRAN) 8 MG tablet Take 1 tablet two times a day starting the day after chemo for 2 days. Then take 1 tablet two times a day as needed for nausea or vomiting. (this to be taken the day after the 63fu pump has been removed x 2 days).       . prochlorperazine (COMPAZINE) 10 MG tablet Take 10 mg by mouth every 6 (six) hours as needed. For nausea/vomiting       . promethazine  (PHENERGAN) 25 MG tablet Take 1 tablet (25 mg total) by mouth every 6 (six) hours as needed for nausea.  30 tablet  1  . VIAGRA 50 MG tablet take 1 tablet by mouth once daily if needed  20 tablet  3  . [DISCONTINUED] lisinopril (PRINIVIL,ZESTRIL) 20 MG tablet take 1 tablet by mouth once daily  30 tablet  1    Discontinued Meds:    Medications Discontinued During This Encounter  Medication Reason  . atorvastatin (LIPITOR) 80 MG tablet Error  . lisinopril (PRINIVIL,ZESTRIL) 20 MG tablet Error  . potassium chloride SA (K-DUR,KLOR-CON) 20 MEQ tablet Error  . LEUCOVORIN CALCIUM IV Error    Patient Active Problem List  Diagnosis  . HYPERLIPIDEMIA  . Tobacco abuse, in remission  . HYPERTENSION  . COPD  . SPLENOMEGALY  . SMALL BOWEL OBSTRUCTION, HX OF  . Rectal cancer    LABS Infusion on 02/07/2012  Component Date Value  . WBC 02/07/2012 10.5   .  RBC 02/07/2012 4.88   . Hemoglobin 02/07/2012 14.3   . HCT 02/07/2012 42.4   . MCV 02/07/2012 86.9   . [facility] 02/07/2012 29.3   . MCHC 02/07/2012 33.7   . RDW 02/07/2012 14.2   . Platelets 02/07/2012 297   . Neutrophils Relative 02/07/2012 72   . Neutro Abs 02/07/2012 7.6   . Lymphocytes Relative 02/07/2012 16   . Lymphs Abs 02/07/2012 1.7   . Monocytes Relative 02/07/2012 7   . Monocytes Absolute 02/07/2012 0.7   . Eosinophils Relative 02/07/2012 4   . Eosinophils Absolute 02/07/2012 0.4   . Basophils Relative 02/07/2012 1   . Basophils Absolute 02/07/2012 0.1   . Sodium 02/07/2012 139   . Potassium 02/07/2012 3.8   . Chloride 02/07/2012 104   . CO2 02/07/2012 21   . Glucose, Bld 02/07/2012 115*  . BUN 02/07/2012 22   . Creatinine, Ser 02/07/2012 1.83*  . Calcium 02/07/2012 9.6   . Total Protein 02/07/2012 7.3   . Albumin 02/07/2012 3.7   . AST 02/07/2012 21   . ALT 02/07/2012 24   . Alkaline Phosphatase 02/07/2012 127*  . Total Bilirubin 02/07/2012 0.2*  . GFR calc non Af Amer 02/07/2012 40*  . GFR calc Af Amer  02/07/2012 47*  Infusion on 01/24/2012  Component Date Value  . WBC 01/24/2012 9.4   . RBC 01/24/2012 4.74   . Hemoglobin 01/24/2012 13.9   . HCT 01/24/2012 42.1   . MCV 01/24/2012 88.8   . [facility] 01/24/2012 29.3   . MCHC 01/24/2012 33.0   . RDW 01/24/2012 13.5   . Platelets 01/24/2012 346   . Neutrophils Relative 01/24/2012 72   . Neutro Abs 01/24/2012 6.8   . Lymphocytes Relative 01/24/2012 16   . Lymphs Abs 01/24/2012 1.5   . Monocytes Relative 01/24/2012 7   . Monocytes Absolute 01/24/2012 0.6   . Eosinophils Relative 01/24/2012 4   . Eosinophils Absolute 01/24/2012 0.4   . Basophils Relative 01/24/2012 1   . Basophils Absolute 01/24/2012 0.1   . Sodium 01/24/2012 137   . Potassium 01/24/2012 3.7   . Chloride 01/24/2012 103   . CO2 01/24/2012 23   . Glucose, Bld 01/24/2012 111*  . BUN 01/24/2012 14   . Creatinine, Ser 01/24/2012 1.32   . Calcium 01/24/2012 9.5   . Total Protein 01/24/2012 7.1   . Albumin 01/24/2012 3.5   . AST 01/24/2012 17   . ALT 01/24/2012 10   . Alkaline Phosphatase 01/24/2012 98   . Total Bilirubin 01/24/2012 0.2*  . GFR calc non Af Amer 01/24/2012 60*  . GFR calc Af Amer 01/24/2012 69*  Infusion on 01/06/2012  Component Date Value  . WBC 01/06/2012 8.5   . RBC 01/06/2012 4.57   . Hemoglobin 01/06/2012 14.1   . HCT 01/06/2012 41.4   . MCV 01/06/2012 90.6   . [facility] 01/06/2012 30.9   . MCHC 01/06/2012 34.1   . RDW 01/06/2012 13.6   . Platelets 01/06/2012 300   . Neutrophils Relative 01/06/2012 65   . Neutro Abs 01/06/2012 5.5   . Lymphocytes Relative 01/06/2012 21   . Lymphs Abs 01/06/2012 1.8   . Monocytes Relative 01/06/2012 7   . Monocytes Absolute 01/06/2012 0.6   . Eosinophils Relative 01/06/2012 5   . Eosinophils Absolute 01/06/2012 0.4   . Basophils Relative 01/06/2012 1   . Basophils Absolute 01/06/2012 0.1   . Sodium 01/06/2012 140   . Potassium 01/06/2012 3.6   .  Chloride 01/06/2012 105   . CO2 01/06/2012 24   . Glucose, Bld  01/06/2012 97   . BUN 01/06/2012 14   . Creatinine, Ser 01/06/2012 1.42*  . Calcium 01/06/2012 9.9   . Total Protein 01/06/2012 7.5   . Albumin 01/06/2012 4.0   . AST 01/06/2012 20   . ALT 01/06/2012 15   . Alkaline Phosphatase 01/06/2012 103   . Total Bilirubin 01/06/2012 0.2*  . GFR calc non Af Amer 01/06/2012 55*  . GFR calc Af Amer 01/06/2012 63*  . CEA 01/06/2012 6.1*     Results for this Opt Visit:     Results for orders placed in visit on 02/07/12  CBC WITH DIFFERENTIAL      Component Value Range   WBC 10.5  4.0 - 10.5 K/uL   RBC 4.88  4.22 - 5.81 MIL/uL   Hemoglobin 14.3  13.0 - 17.0 g/dL   HCT 16.1  09.6 - 04.5 %   MCV 86.9  78.0 - 100.0 fL   MCH 29.3  26.0 - 34.0 pg   MCHC 33.7  30.0 - 36.0 g/dL   RDW 40.9  81.1 - 91.4 %   Platelets 297  150 - 400 K/uL   Neutrophils Relative 72  43 - 77 %   Neutro Abs 7.6  1.7 - 7.7 K/uL   Lymphocytes Relative 16  12 - 46 %   Lymphs Abs 1.7  0.7 - 4.0 K/uL   Monocytes Relative 7  3 - 12 %   Monocytes Absolute 0.7  0.1 - 1.0 K/uL   Eosinophils Relative 4  0 - 5 %   Eosinophils Absolute 0.4  0.0 - 0.7 K/uL   Basophils Relative 1  0 - 1 %   Basophils Absolute 0.1  0.0 - 0.1 K/uL  COMPREHENSIVE METABOLIC PANEL      Component Value Range   Sodium 139  135 - 145 mEq/L   Potassium 3.8  3.5 - 5.1 mEq/L   Chloride 104  96 - 112 mEq/L   CO2 21  19 - 32 mEq/L   Glucose, Bld 115 (*) 70 - 99 mg/dL   BUN 22  6 - 23 mg/dL   Creatinine, Ser 7.82 (*) 0.50 - 1.35 mg/dL   Calcium 9.6  8.4 - 95.6 mg/dL   Total Protein 7.3  6.0 - 8.3 g/dL   Albumin 3.7  3.5 - 5.2 g/dL   AST 21  0 - 37 U/L   ALT 24  0 - 53 U/L   Alkaline Phosphatase 127 (*) 39 - 117 U/L   Total Bilirubin 0.2 (*) 0.3 - 1.2 mg/dL   GFR calc non Af Amer 40 (*) >90 mL/min   GFR calc Af Amer 47 (*) >90 mL/min    EKG Orders placed in visit on 02/14/12  . EKG 12-LEAD     Prior Assessment and Plan Problem List as of 02/14/2012            Cardiology Problems    HYPERLIPIDEMIA   Last Assessment & Plan Note   02/01/2011 Office Visit Addendum 02/04/2011  6:28 PM by Kathlen Brunswick, MD    Hyperlipidemia is fairly severe.  I hope that when the available lipid profile was obtained the patient was not receiving pharmacologic therapy.  One month after resumption of his medication, a repeat profile will be obtained, but I suspect he will need more intensive therapy than he has received in the past.    HYPERTENSION  Other   Tobacco abuse, in remission   COPD   SPLENOMEGALY   SMALL BOWEL OBSTRUCTION, HX OF   Rectal cancer       Imaging: Ct Chest W Contrast  01/17/2012  *RADIOLOGY REPORT*  Clinical Data:  Metastatic rectal cancer.  CT CHEST, ABDOMEN AND PELVIS WITH CONTRAST  Technique:  Multidetector CT imaging of the chest, abdomen and pelvis was performed following the standard protocol during bolus administration of intravenous contrast.  Contrast: OMNIPAQUE IOHEXOL 300 MG/ML  SOLN  Comparison:  CT of the chest abdomen and pelvis 10/19/2011.  CT CHEST  Findings:  Mediastinum: Compared to the prior study there is now extensive left hilar lymphadenopathy measuring up to 3.7 and by a 1.6 cm. Prominent AP window lymph node also measures 2.7 x 2.4 cm. Heart size is normal. There is no significant pericardial fluid, thickening or pericardial calcification. There is atherosclerosis of the thoracic aorta, the great vessels of the mediastinum and the coronary arteries, including calcified atherosclerotic plaque in the left main, left anterior descending, left circumflex and right coronary arteries. The patient is status post median sternotomy for CABG with a LIMA to the LAD.  Esophagus is unremarkable in appearance.  Lungs/Pleura: Compared to the recent prior examination there has been a significant interval increase in the size of the numerous previously noted pulmonary nodules.  Specific examples include a 3.3 x 2.5 cm mass in the superior segment of the  right lower lobe (image 35 of series 3) which previously measured only 1.2 x 1.9 cm. The additionally, there is a pleural based mass in the posterior aspect of the left lower lobe (image 45 of series 3) measuring 3.2 x 2.0 cm on today's examination, previously only 1.2 x 2.3 cm. All other previously noted pulmonary nodules also appear larger than the prior study.  Additionally, there is increasing pleural-based nodularity throughout the left hemithorax, and a new moderate sized left pleural effusion, which is compatible with a malignant effusion.  Multiple large enhancing left-sided pleural-based nodules and masses are noted, measuring up to 3.4 x 2.0 cm (image 37 of series 2).  Musculoskeletal: Median sternotomy wires. There are no aggressive appearing lytic or blastic lesions noted in the visualized portions of the skeleton.  IMPRESSION:  1.  Significant progression of metastatic disease to the chest as demonstrated by interval enlargement of numerous pulmonary nodules and masses, new malignant left pleural effusion, and left hilar and mediastinal lymphadenopathy, as above.  CT ABDOMEN AND PELVIS  Findings:  Abdomen/Pelvis: Subcentimeter low attenuation lesion in segment 7 of the liver is unchanged and remains too small to definitively characterize.  Small granuloma in the segment 8 of the liver is incidentally noted.  No new hepatic lesions are otherwise identified.  Small calcified gallstone layering dependently in the neck of the gallbladder.  No findings to suggest acute cholecystitis at this time.  The enhanced appearance of the pancreas, spleen, bilateral adrenal glands and the left kidney is unremarkable.  A 4 mm nonobstructive calculus in the lower pole collecting system of the right kidney is incidentally noted (unchanged).  Status post abdominoperineal resection (APR) with left lower quadrant colostomy. The small peristomal hernia is unchanged.  A small ventral hernia is unchanged.  There continues to be  some soft tissue fullness in the presacral space which is unchanged compared to numerous prior examinations.  No definite abnormal soft tissue mass or pathologic lymphadenopathy is identified within the abdomen or pelvis.  No ascites or pneumoperitoneum and no  pathologic distension of small bowel. Atherosclerosis throughout the abdominal and pelvic vasculature, without definite aneurysm or dissection.  Musculoskeletal: There are no aggressive appearing lytic or blastic lesions noted in the visualized portions of the skeleton.  IMPRESSION:  1.  Status post APR with left lower quadrant colostomy.  No findings to suggest local disease recurrence or metastatic disease in the abdomen or pelvis. 2.  Small parastomal hernia at around the left lower quadrant colostomy is unchanged. 3.  Small ventral hernia is also unchanged. 4.  Soft tissue fullness in the presacral space is unchanged, likely related to prior radiation therapy. 5.  Cholelithiasis without findings to suggest acute cholecystitis. 6.  Additional incidental findings, similar to prior examinations, as above.  These results were called by telephone on 01/17/2012 at 09:55 a.m. to Dr. Mariel Sleet, who verbally acknowledged these results.   Original Report Authenticated By: Florencia Reasons, M.D.    Ct Abdomen Pelvis W Contrast  01/17/2012  *RADIOLOGY REPORT*  Clinical Data:  Metastatic rectal cancer.  CT CHEST, ABDOMEN AND PELVIS WITH CONTRAST  Technique:  Multidetector CT imaging of the chest, abdomen and pelvis was performed following the standard protocol during bolus administration of intravenous contrast.  Contrast: OMNIPAQUE IOHEXOL 300 MG/ML  SOLN  Comparison:  CT of the chest abdomen and pelvis 10/19/2011.  CT CHEST  Findings:  Mediastinum: Compared to the prior study there is now extensive left hilar lymphadenopathy measuring up to 3.7 and by a 1.6 cm. Prominent AP window lymph node also measures 2.7 x 2.4 cm. Heart size is normal. There is no  significant pericardial fluid, thickening or pericardial calcification. There is atherosclerosis of the thoracic aorta, the great vessels of the mediastinum and the coronary arteries, including calcified atherosclerotic plaque in the left main, left anterior descending, left circumflex and right coronary arteries. The patient is status post median sternotomy for CABG with a LIMA to the LAD.  Esophagus is unremarkable in appearance.  Lungs/Pleura: Compared to the recent prior examination there has been a significant interval increase in the size of the numerous previously noted pulmonary nodules.  Specific examples include a 3.3 x 2.5 cm mass in the superior segment of the right lower lobe (image 35 of series 3) which previously measured only 1.2 x 1.9 cm. The additionally, there is a pleural based mass in the posterior aspect of the left lower lobe (image 45 of series 3) measuring 3.2 x 2.0 cm on today's examination, previously only 1.2 x 2.3 cm. All other previously noted pulmonary nodules also appear larger than the prior study.  Additionally, there is increasing pleural-based nodularity throughout the left hemithorax, and a new moderate sized left pleural effusion, which is compatible with a malignant effusion.  Multiple large enhancing left-sided pleural-based nodules and masses are noted, measuring up to 3.4 x 2.0 cm (image 37 of series 2).  Musculoskeletal: Median sternotomy wires. There are no aggressive appearing lytic or blastic lesions noted in the visualized portions of the skeleton.  IMPRESSION:  1.  Significant progression of metastatic disease to the chest as demonstrated by interval enlargement of numerous pulmonary nodules and masses, new malignant left pleural effusion, and left hilar and mediastinal lymphadenopathy, as above.  CT ABDOMEN AND PELVIS  Findings:  Abdomen/Pelvis: Subcentimeter low attenuation lesion in segment 7 of the liver is unchanged and remains too small to definitively  characterize.  Small granuloma in the segment 8 of the liver is incidentally noted.  No new hepatic lesions are otherwise identified.  Small calcified gallstone layering dependently in the neck of the gallbladder.  No findings to suggest acute cholecystitis at this time.  The enhanced appearance of the pancreas, spleen, bilateral adrenal glands and the left kidney is unremarkable.  A 4 mm nonobstructive calculus in the lower pole collecting system of the right kidney is incidentally noted (unchanged).  Status post abdominoperineal resection (APR) with left lower quadrant colostomy. The small peristomal hernia is unchanged.  A small ventral hernia is unchanged.  There continues to be some soft tissue fullness in the presacral space which is unchanged compared to numerous prior examinations.  No definite abnormal soft tissue mass or pathologic lymphadenopathy is identified within the abdomen or pelvis.  No ascites or pneumoperitoneum and no pathologic distension of small bowel. Atherosclerosis throughout the abdominal and pelvic vasculature, without definite aneurysm or dissection.  Musculoskeletal: There are no aggressive appearing lytic or blastic lesions noted in the visualized portions of the skeleton.  IMPRESSION:  1.  Status post APR with left lower quadrant colostomy.  No findings to suggest local disease recurrence or metastatic disease in the abdomen or pelvis. 2.  Small parastomal hernia at around the left lower quadrant colostomy is unchanged. 3.  Small ventral hernia is also unchanged. 4.  Soft tissue fullness in the presacral space is unchanged, likely related to prior radiation therapy. 5.  Cholelithiasis without findings to suggest acute cholecystitis. 6.  Additional incidental findings, similar to prior examinations, as above.  These results were called by telephone on 01/17/2012 at 09:55 a.m. to Dr. Mariel Sleet, who verbally acknowledged these results.   Original Report Authenticated By: Florencia Reasons, M.D.      [facility] Calculation: Score not calculated. Missing: Total Cholesterol

## 2012-02-14 NOTE — Assessment & Plan Note (Signed)
PCP, Dr. Sherryll Burger does labs and evaluates status. He is intolerant to statins.

## 2012-02-14 NOTE — Progress Notes (Signed)
HPI: Martin Maldonado is a very pleasant 56 year old male patient of Dr. [place] Bing we are following for ongoing assessment and treatment of known history of coronary artery disease and cardiovascular risk factors. The patient has a history of recurrent adenocarcinoma, he is followed by Dr. Mariel Sleet for labs weekly. The patient has had metastases to the lung, and is now on a brother 7 week trial of chemotherapy. The patient has an excellent attitude, denies any chest discomfort, palpitations, weakness, or dyspnea on exertion. He states he will not let this illness get him down and he continues to remain strong and determined to get better. He is normally followed by Dr. Sherryll Burger his primary care physician for labs to include cholesterol studies.  No Known Allergies  Current Outpatient Prescriptions  Medication Sig Dispense Refill  . aspirin 81 MG tablet Take 81 mg by mouth daily.        . Cetuximab (ERBITUX IV) Inject into the vein once a week.      . clindamycin (CLINDAGEL) 1 % gel Apply topically 2 (two) times daily.  30 g  4  . dexamethasone (DECADRON) 4 MG tablet Take 2 tablets PO starting the day after chemo for 2 days. Take with food. Take the day after the 97fu pump has been removed x 2 days  30 tablet  1  . fluorouracil (ADRUCIL) 2.5 GM/50ML SOLN Inject into the vein once. Pt to receive this q14 days       . lisinopril (PRINIVIL,ZESTRIL) 20 MG tablet Take 1 tablet (20 mg total) by mouth daily.  30 tablet  12  . LORazepam (ATIVAN) 1 MG tablet take 1 tablet by mouth every 4 hours if needed for nausea  30 tablet  2  . metoprolol (LOPRESSOR) 50 MG tablet take 1 tablet by mouth twice a day  30 tablet  1  . multivitamin (THERAGRAN) per tablet Take 1 tablet by mouth daily.        . ondansetron (ZOFRAN) 8 MG tablet Take 1 tablet two times a day starting the day after chemo for 2 days. Then take 1 tablet two times a day as needed for nausea or vomiting. (this to be taken the day after the 36fu pump has  been removed x 2 days).       . prochlorperazine (COMPAZINE) 10 MG tablet Take 10 mg by mouth every 6 (six) hours as needed. For nausea/vomiting       . promethazine (PHENERGAN) 25 MG tablet Take 1 tablet (25 mg total) by mouth every 6 (six) hours as needed for nausea.  30 tablet  1  . VIAGRA 50 MG tablet take 1 tablet by mouth once daily if needed  20 tablet  3  . [DISCONTINUED] lisinopril (PRINIVIL,ZESTRIL) 20 MG tablet take 1 tablet by mouth once daily  30 tablet  1    Past Medical History  Diagnosis Date  . Hypertension   . Rectal cancer 2008    Adenocarcinoma; AP resection in 2008; recurrence with lung metastases in 2011  . Arteriosclerotic cardiovascular disease (ASCVD) 2011    CABG surgery in 2011  . Hyperlipidemia   . 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 Splenomegaly 01/23/2009    Additional GI history includes small bowel obstruction, requiring laparotomy and lysis of adhesions in 2009, remote peptic ulcer disease and postoperative perirectal infection  . ALCOHOL ABUSE, HX OF 01/23/2009    Past Surgical History  Procedure Date  .  Irrigation and debridement sebaceous cyst   . Laparoscopic lysis intestinal adhesions 2009    With incidental appendectomy  . Coronary artery bypass graft 2011    2 vessel  . Abdominoperineal proctocolectomy 2008  . Portacath placement 2011    [id number]:[id number] of systems complete and found to be negative unless listed above  PHYSICAL EXAM BP 138/82  Pulse 72  Ht 6\' 1"  (1.854 m)  Wt 209 lb (94.802 kg)  BMI 27.57 kg/m2  General: Well developed, well nourished, in no acute distress Head: Eyes PERRLA, No xanthomas.   Normal cephalic and atramatic  Lungs: Clear bilaterally to auscultation and percussion. Heart: HRRR S1 S2, without MRG.  Pulses are 2+ & equal.            No carotid bruit. No JVD.  No abdominal bruits. No femoral bruits. Abdomen: Bowel sounds are positive, colostomy noted on the  left abdomen soft and non-tender without masses or                  Hernia's noted. Large well healed midline scar. Msk:  Back normal, normal gait. Normal strength and tone for age. Port-A-Cath on the right chest. Extremities: No clubbing, cyanosis or edema.  DP +1 Neuro: Alert and oriented X 3. Psych:  Good affect, responds appropriately   EKG: NSR rate of 72 bpm.   ASSESSMENT AND PLAN

## 2012-02-15 ENCOUNTER — Encounter (HOSPITAL_BASED_OUTPATIENT_CLINIC_OR_DEPARTMENT_OTHER): Payer: Managed Care, Other (non HMO)

## 2012-02-15 VITALS — BP 170/80 | HR 67 | Temp 97.6°F | Resp 20 | Wt 210.8 lb

## 2012-02-15 DIAGNOSIS — C7800 Secondary malignant neoplasm of unspecified lung: Secondary | ICD-10-CM

## 2012-02-15 DIAGNOSIS — C20 Malignant neoplasm of rectum: Secondary | ICD-10-CM

## 2012-02-15 DIAGNOSIS — Z5112 Encounter for antineoplastic immunotherapy: Secondary | ICD-10-CM

## 2012-02-15 MED ORDER — DIPHENHYDRAMINE HCL 50 MG/ML IJ SOLN
INTRAMUSCULAR | Status: AC
  Filled 2012-02-15: qty 1

## 2012-02-15 MED ORDER — HEPARIN SOD (PORK) LOCK FLUSH 100 UNIT/ML IV SOLN
INTRAVENOUS | Status: AC
  Filled 2012-02-15: qty 5

## 2012-02-15 MED ORDER — SODIUM CHLORIDE 0.9 % IV SOLN
Freq: Once | INTRAVENOUS | Status: AC
  Administered 2012-02-15: 11:00:00 via INTRAVENOUS

## 2012-02-15 MED ORDER — CETUXIMAB CHEMO IV INJECTION 200 MG/100ML
250.0000 mg/m2 | Freq: Once | INTRAVENOUS | Status: AC
  Administered 2012-02-15: 600 mg via INTRAVENOUS
  Filled 2012-02-15: qty 300

## 2012-02-15 MED ORDER — DIPHENHYDRAMINE HCL 50 MG/ML IJ SOLN
50.0000 mg | Freq: Once | INTRAMUSCULAR | Status: AC
  Administered 2012-02-15: 50 mg via INTRAVENOUS

## 2012-02-15 MED ORDER — HEPARIN SOD (PORK) LOCK FLUSH 100 UNIT/ML IV SOLN
500.0000 [IU] | Freq: Once | INTRAVENOUS | Status: AC | PRN
  Administered 2012-02-15: 500 [IU]
  Filled 2012-02-15: qty 5

## 2012-02-21 ENCOUNTER — Encounter (HOSPITAL_COMMUNITY): Payer: Managed Care, Other (non HMO) | Attending: Oncology

## 2012-02-21 DIAGNOSIS — C7800 Secondary malignant neoplasm of unspecified lung: Secondary | ICD-10-CM

## 2012-02-21 DIAGNOSIS — C20 Malignant neoplasm of rectum: Secondary | ICD-10-CM | POA: Insufficient documentation

## 2012-02-21 DIAGNOSIS — R21 Rash and other nonspecific skin eruption: Secondary | ICD-10-CM | POA: Insufficient documentation

## 2012-02-21 LAB — CBC WITH DIFFERENTIAL/PLATELET
Eosinophils Absolute: 0.5 10*3/uL (ref 0.0–0.7)
Eosinophils Relative: 6 % — ABNORMAL HIGH (ref 0–5)
HCT: 42.4 % (ref 39.0–52.0)
Lymphocytes Relative: 22 % (ref 12–46)
Lymphs Abs: 1.9 10*3/uL (ref 0.7–4.0)
MCH: 29.1 pg (ref 26.0–34.0)
MCV: 86.9 fL (ref 78.0–100.0)
Monocytes Absolute: 0.6 10*3/uL (ref 0.1–1.0)
Monocytes Relative: 7 % (ref 3–12)
RBC: 4.88 MIL/uL (ref 4.22–5.81)
WBC: 8.5 10*3/uL (ref 4.0–10.5)

## 2012-02-21 LAB — COMPREHENSIVE METABOLIC PANEL
ALT: 16 U/L (ref 0–53)
BUN: 30 mg/dL — ABNORMAL HIGH (ref 6–23)
CO2: 23 mEq/L (ref 19–32)
Calcium: 10.2 mg/dL (ref 8.4–10.5)
Creatinine, Ser: 1.92 mg/dL — ABNORMAL HIGH (ref 0.50–1.35)
GFR calc Af Amer: 44 mL/min — ABNORMAL LOW (ref >90)
GFR calc non Af Amer: 38 mL/min — ABNORMAL LOW (ref >90)
Glucose, Bld: 108 mg/dL — ABNORMAL HIGH (ref 70–99)

## 2012-02-21 NOTE — Progress Notes (Signed)
Labs drawn today for cbc/diff,cmp 

## 2012-02-22 ENCOUNTER — Encounter (HOSPITAL_BASED_OUTPATIENT_CLINIC_OR_DEPARTMENT_OTHER): Payer: Managed Care, Other (non HMO)

## 2012-02-22 VITALS — BP 142/83 | HR 67 | Temp 97.5°F | Resp 18 | Wt 211.2 lb

## 2012-02-22 DIAGNOSIS — C7800 Secondary malignant neoplasm of unspecified lung: Secondary | ICD-10-CM

## 2012-02-22 DIAGNOSIS — Z5111 Encounter for antineoplastic chemotherapy: Secondary | ICD-10-CM

## 2012-02-22 DIAGNOSIS — Z5112 Encounter for antineoplastic immunotherapy: Secondary | ICD-10-CM

## 2012-02-22 DIAGNOSIS — C20 Malignant neoplasm of rectum: Secondary | ICD-10-CM

## 2012-02-22 MED ORDER — HEPARIN SOD (PORK) LOCK FLUSH 100 UNIT/ML IV SOLN
500.0000 [IU] | Freq: Once | INTRAVENOUS | Status: DC | PRN
  Filled 2012-02-22: qty 5

## 2012-02-22 MED ORDER — FLUOROURACIL CHEMO INJECTION 2.5 GM/50ML
400.0000 mg/m2 | Freq: Once | INTRAVENOUS | Status: AC
  Administered 2012-02-22: 900 mg via INTRAVENOUS
  Filled 2012-02-22: qty 18

## 2012-02-22 MED ORDER — DIPHENHYDRAMINE HCL 50 MG/ML IJ SOLN
INTRAMUSCULAR | Status: AC
  Filled 2012-02-22: qty 1

## 2012-02-22 MED ORDER — LEUCOVORIN CALCIUM INJECTION 100 MG
20.0000 mg/m2 | Freq: Once | INTRAMUSCULAR | Status: AC
  Administered 2012-02-22: 44 mg via INTRAVENOUS
  Filled 2012-02-22: qty 2.2

## 2012-02-22 MED ORDER — SODIUM CHLORIDE 0.9 % IJ SOLN
INTRAMUSCULAR | Status: AC
  Filled 2012-02-22: qty 10

## 2012-02-22 MED ORDER — SODIUM CHLORIDE 0.9 % IJ SOLN
10.0000 mL | INTRAMUSCULAR | Status: DC | PRN
  Filled 2012-02-22: qty 10

## 2012-02-22 MED ORDER — LEUCOVORIN CALCIUM INJECTION 350 MG: 400.0000 mg/m2 | Freq: Once | INTRAVENOUS | Status: DC

## 2012-02-22 MED ORDER — CETUXIMAB CHEMO IV INJECTION 200 MG/100ML
250.0000 mg/m2 | Freq: Once | INTRAVENOUS | Status: AC
  Administered 2012-02-22: 600 mg via INTRAVENOUS
  Filled 2012-02-22: qty 300

## 2012-02-22 MED ORDER — SODIUM CHLORIDE 0.9 % IV SOLN
2400.0000 mg/m2 | INTRAVENOUS | Status: DC
  Administered 2012-02-22: 5300 mg via INTRAVENOUS
  Filled 2012-02-22 (×2): qty 106

## 2012-02-22 MED ORDER — SODIUM CHLORIDE 0.9 % IV SOLN
Freq: Once | INTRAVENOUS | Status: AC
  Administered 2012-02-22: 09:00:00 via INTRAVENOUS

## 2012-02-22 MED ORDER — SODIUM CHLORIDE 0.9 % IV SOLN
8.0000 mg | Freq: Once | INTRAVENOUS | Status: AC
  Administered 2012-02-22: 8 mg via INTRAVENOUS
  Filled 2012-02-22: qty 4

## 2012-02-22 MED ORDER — DIPHENHYDRAMINE HCL 50 MG/ML IJ SOLN
50.0000 mg | Freq: Once | INTRAMUSCULAR | Status: AC
  Administered 2012-02-22: 50 mg via INTRAVENOUS

## 2012-02-22 NOTE — Progress Notes (Signed)
Tolerated well

## 2012-02-24 ENCOUNTER — Encounter (HOSPITAL_BASED_OUTPATIENT_CLINIC_OR_DEPARTMENT_OTHER): Payer: Managed Care, Other (non HMO)

## 2012-02-24 ENCOUNTER — Other Ambulatory Visit (HOSPITAL_COMMUNITY): Payer: Self-pay | Admitting: Oncology

## 2012-02-24 VITALS — BP 144/91 | HR 78 | Temp 97.2°F

## 2012-02-24 DIAGNOSIS — C20 Malignant neoplasm of rectum: Secondary | ICD-10-CM

## 2012-02-24 MED ORDER — SODIUM CHLORIDE 0.9 % IJ SOLN
10.0000 mL | INTRAMUSCULAR | Status: DC | PRN
  Administered 2012-02-24: 10 mL
  Filled 2012-02-24: qty 10

## 2012-02-24 MED ORDER — SODIUM CHLORIDE 0.9 % IJ SOLN
INTRAMUSCULAR | Status: AC
  Filled 2012-02-24: qty 10

## 2012-02-24 MED ORDER — HEPARIN SOD (PORK) LOCK FLUSH 100 UNIT/ML IV SOLN
INTRAVENOUS | Status: AC
  Filled 2012-02-24: qty 5

## 2012-02-24 MED ORDER — HEPARIN SOD (PORK) LOCK FLUSH 100 UNIT/ML IV SOLN
500.0000 [IU] | Freq: Once | INTRAVENOUS | Status: AC | PRN
  Administered 2012-02-24: 500 [IU]
  Filled 2012-02-24: qty 5

## 2012-02-24 NOTE — Progress Notes (Signed)
Remove 33fu pump

## 2012-02-28 ENCOUNTER — Ambulatory Visit (HOSPITAL_COMMUNITY)
Admission: RE | Admit: 2012-02-28 | Discharge: 2012-02-28 | Disposition: A | Discharge status: Home / Self Care | Payer: Managed Care, Other (non HMO) | Source: Ambulatory Visit | Attending: Cardiology | Admitting: Cardiology

## 2012-02-28 DIAGNOSIS — I10 Essential (primary) hypertension: Secondary | ICD-10-CM | POA: Insufficient documentation

## 2012-02-28 DIAGNOSIS — I25810 Atherosclerosis of coronary artery bypass graft(s) without angina pectoris: Secondary | ICD-10-CM | POA: Insufficient documentation

## 2012-02-28 DIAGNOSIS — I369 Nonrheumatic tricuspid valve disorder, unspecified: Secondary | ICD-10-CM | POA: Insufficient documentation

## 2012-02-28 DIAGNOSIS — J4489 Other specified chronic obstructive pulmonary disease: Secondary | ICD-10-CM | POA: Insufficient documentation

## 2012-02-28 DIAGNOSIS — I059 Rheumatic mitral valve disease, unspecified: Secondary | ICD-10-CM | POA: Insufficient documentation

## 2012-02-28 DIAGNOSIS — I517 Cardiomegaly: Secondary | ICD-10-CM

## 2012-02-28 DIAGNOSIS — J449 Chronic obstructive pulmonary disease, unspecified: Secondary | ICD-10-CM | POA: Insufficient documentation

## 2012-02-28 DIAGNOSIS — I2510 Atherosclerotic heart disease of native coronary artery without angina pectoris: Secondary | ICD-10-CM

## 2012-02-28 NOTE — Progress Notes (Signed)
*  PRELIMINARY RESULTS* Echocardiogram 2D Echocardiogram has been performed.  Conrad Kaibab 02/28/2012, 10:47 AM

## 2012-02-29 ENCOUNTER — Encounter (HOSPITAL_BASED_OUTPATIENT_CLINIC_OR_DEPARTMENT_OTHER): Payer: Managed Care, Other (non HMO)

## 2012-02-29 VITALS — BP 131/74 | HR 66 | Temp 98.1°F | Resp 18 | Wt 212.0 lb

## 2012-02-29 DIAGNOSIS — C20 Malignant neoplasm of rectum: Secondary | ICD-10-CM

## 2012-02-29 DIAGNOSIS — Z5112 Encounter for antineoplastic immunotherapy: Secondary | ICD-10-CM

## 2012-02-29 DIAGNOSIS — C7800 Secondary malignant neoplasm of unspecified lung: Secondary | ICD-10-CM

## 2012-02-29 LAB — COMPREHENSIVE METABOLIC PANEL
ALT: 17 U/L (ref 0–53)
AST: 15 U/L (ref 0–37)
Albumin: 3.7 g/dL (ref 3.5–5.2)
Calcium: 8.5 mg/dL (ref 8.4–10.5)
GFR calc Af Amer: 45 mL/min — ABNORMAL LOW (ref >90)
Glucose, Bld: 74 mg/dL (ref 70–99)
Sodium: 139 mEq/L (ref 135–145)
Total Protein: 6.9 g/dL (ref 6.0–8.3)

## 2012-02-29 LAB — CBC WITH DIFFERENTIAL/PLATELET
Basophils Absolute: 0.1 10*3/uL (ref 0.0–0.1)
Basophils Relative: 1 % (ref 0–1)
Eosinophils Absolute: 0.4 10*3/uL (ref 0.0–0.7)
Eosinophils Relative: 5 % (ref 0–5)
MCH: 29.1 pg (ref 26.0–34.0)
MCHC: 34 g/dL (ref 30.0–36.0)
MCV: 85.7 fL (ref 78.0–100.0)
Platelets: 230 10*3/uL (ref 150–400)
RDW: 15.4 % (ref 11.5–15.5)

## 2012-02-29 MED ORDER — HEPARIN SOD (PORK) LOCK FLUSH 100 UNIT/ML IV SOLN
500.0000 [IU] | Freq: Once | INTRAVENOUS | Status: AC | PRN
  Administered 2012-02-29: 500 [IU]
  Filled 2012-02-29: qty 5

## 2012-02-29 MED ORDER — SODIUM CHLORIDE 0.9 % IV SOLN
Freq: Once | INTRAVENOUS | Status: AC
  Administered 2012-02-29: 10:00:00 via INTRAVENOUS

## 2012-02-29 MED ORDER — HEPARIN SOD (PORK) LOCK FLUSH 100 UNIT/ML IV SOLN
INTRAVENOUS | Status: AC
  Filled 2012-02-29: qty 5

## 2012-02-29 MED ORDER — DIPHENHYDRAMINE HCL 50 MG/ML IJ SOLN
50.0000 mg | Freq: Once | INTRAMUSCULAR | Status: AC
  Administered 2012-02-29: 50 mg via INTRAVENOUS

## 2012-02-29 MED ORDER — CETUXIMAB CHEMO IV INJECTION 200 MG/100ML
250.0000 mg/m2 | Freq: Once | INTRAVENOUS | Status: AC
  Administered 2012-02-29: 600 mg via INTRAVENOUS
  Filled 2012-02-29: qty 300

## 2012-02-29 MED ORDER — DIPHENHYDRAMINE HCL 50 MG/ML IJ SOLN
INTRAMUSCULAR | Status: AC
  Filled 2012-02-29: qty 1

## 2012-03-06 ENCOUNTER — Encounter (HOSPITAL_BASED_OUTPATIENT_CLINIC_OR_DEPARTMENT_OTHER): Payer: Managed Care, Other (non HMO)

## 2012-03-06 DIAGNOSIS — C20 Malignant neoplasm of rectum: Secondary | ICD-10-CM

## 2012-03-06 LAB — CBC WITH DIFFERENTIAL/PLATELET
HCT: 41.6 % (ref 39.0–52.0)
Hemoglobin: 13.7 g/dL (ref 13.0–17.0)
Lymphocytes Relative: 16 % (ref 12–46)
Lymphs Abs: 1.9 10*3/uL (ref 0.7–4.0)
Monocytes Absolute: 1 10*3/uL (ref 0.1–1.0)
Monocytes Relative: 9 % (ref 3–12)
Neutro Abs: 8.3 10*3/uL — ABNORMAL HIGH (ref 1.7–7.7)
RBC: 4.75 MIL/uL (ref 4.22–5.81)
WBC: 11.7 10*3/uL — ABNORMAL HIGH (ref 4.0–10.5)

## 2012-03-06 LAB — COMPREHENSIVE METABOLIC PANEL
Alkaline Phosphatase: 119 U/L — ABNORMAL HIGH (ref 39–117)
BUN: 17 mg/dL (ref 6–23)
CO2: 25 mEq/L (ref 19–32)
Chloride: 103 mEq/L (ref 96–112)
Creatinine, Ser: 1.56 mg/dL — ABNORMAL HIGH (ref 0.50–1.35)
GFR calc Af Amer: 56 mL/min — ABNORMAL LOW (ref >90)

## 2012-03-06 NOTE — Progress Notes (Signed)
Labs drawn today for cbc/diff,cmp 

## 2012-03-07 ENCOUNTER — Encounter (HOSPITAL_BASED_OUTPATIENT_CLINIC_OR_DEPARTMENT_OTHER): Payer: Managed Care, Other (non HMO)

## 2012-03-07 VITALS — BP 169/91 | HR 68 | Temp 97.8°F | Resp 18 | Wt 215.0 lb

## 2012-03-07 DIAGNOSIS — Z5111 Encounter for antineoplastic chemotherapy: Secondary | ICD-10-CM

## 2012-03-07 DIAGNOSIS — C7800 Secondary malignant neoplasm of unspecified lung: Secondary | ICD-10-CM

## 2012-03-07 DIAGNOSIS — C20 Malignant neoplasm of rectum: Secondary | ICD-10-CM

## 2012-03-07 DIAGNOSIS — Z5112 Encounter for antineoplastic immunotherapy: Secondary | ICD-10-CM

## 2012-03-07 MED ORDER — SODIUM CHLORIDE 0.9 % IJ SOLN
10.0000 mL | INTRAMUSCULAR | Status: DC | PRN
  Administered 2012-03-07: 10 mL
  Filled 2012-03-07: qty 10

## 2012-03-07 MED ORDER — FLUOROURACIL CHEMO INJECTION 2.5 GM/50ML
400.0000 mg/m2 | Freq: Once | INTRAVENOUS | Status: AC
  Administered 2012-03-07: 900 mg via INTRAVENOUS
  Filled 2012-03-07: qty 18

## 2012-03-07 MED ORDER — CETUXIMAB CHEMO IV INJECTION 200 MG/100ML
250.0000 mg/m2 | Freq: Once | INTRAVENOUS | Status: AC
  Administered 2012-03-07: 600 mg via INTRAVENOUS
  Filled 2012-03-07: qty 300

## 2012-03-07 MED ORDER — LEUCOVORIN CALCIUM INJECTION 100 MG
20.0000 mg/m2 | Freq: Once | INTRAMUSCULAR | Status: AC
  Administered 2012-03-07: 44 mg via INTRAVENOUS
  Filled 2012-03-07: qty 2.2

## 2012-03-07 MED ORDER — SODIUM CHLORIDE 0.9 % IV SOLN
8.0000 mg | Freq: Once | INTRAVENOUS | Status: AC
  Administered 2012-03-07: 8 mg via INTRAVENOUS
  Filled 2012-03-07: qty 4

## 2012-03-07 MED ORDER — SODIUM CHLORIDE 0.9 % IV SOLN
Freq: Once | INTRAVENOUS | Status: AC
  Administered 2012-03-07: 09:00:00 via INTRAVENOUS

## 2012-03-07 MED ORDER — HEPARIN SOD (PORK) LOCK FLUSH 100 UNIT/ML IV SOLN
500.0000 [IU] | Freq: Once | INTRAVENOUS | Status: DC | PRN
  Filled 2012-03-07: qty 5

## 2012-03-07 MED ORDER — SODIUM CHLORIDE 0.9 % IV SOLN
2400.0000 mg/m2 | INTRAVENOUS | Status: DC
  Administered 2012-03-07: 5300 mg via INTRAVENOUS
  Filled 2012-03-07 (×2): qty 106

## 2012-03-07 NOTE — Progress Notes (Signed)
Tolerated infusion well. 

## 2012-03-09 ENCOUNTER — Encounter (HOSPITAL_BASED_OUTPATIENT_CLINIC_OR_DEPARTMENT_OTHER): Payer: Managed Care, Other (non HMO)

## 2012-03-09 DIAGNOSIS — C20 Malignant neoplasm of rectum: Secondary | ICD-10-CM

## 2012-03-09 DIAGNOSIS — C7800 Secondary malignant neoplasm of unspecified lung: Secondary | ICD-10-CM

## 2012-03-09 DIAGNOSIS — Z452 Encounter for adjustment and management of vascular access device: Secondary | ICD-10-CM

## 2012-03-09 MED ORDER — SODIUM CHLORIDE 0.9 % IJ SOLN
10.0000 mL | INTRAMUSCULAR | Status: DC | PRN
  Administered 2012-03-09: 10 mL
  Filled 2012-03-09: qty 10

## 2012-03-09 MED ORDER — HEPARIN SOD (PORK) LOCK FLUSH 100 UNIT/ML IV SOLN
INTRAVENOUS | Status: AC
  Filled 2012-03-09: qty 5

## 2012-03-09 MED ORDER — SODIUM CHLORIDE 0.9 % IJ SOLN
INTRAMUSCULAR | Status: AC
  Filled 2012-03-09: qty 20

## 2012-03-09 MED ORDER — HEPARIN SOD (PORK) LOCK FLUSH 100 UNIT/ML IV SOLN
500.0000 [IU] | Freq: Once | INTRAVENOUS | Status: AC | PRN
  Administered 2012-03-09: 500 [IU]
  Filled 2012-03-09: qty 5

## 2012-03-10 ENCOUNTER — Encounter (HOSPITAL_COMMUNITY): Payer: Managed Care, Other (non HMO)

## 2012-03-14 ENCOUNTER — Encounter (HOSPITAL_BASED_OUTPATIENT_CLINIC_OR_DEPARTMENT_OTHER): Payer: Managed Care, Other (non HMO)

## 2012-03-14 VITALS — BP 176/94 | HR 66 | Temp 97.4°F | Resp 16 | Wt 211.4 lb

## 2012-03-14 DIAGNOSIS — C7800 Secondary malignant neoplasm of unspecified lung: Secondary | ICD-10-CM

## 2012-03-14 DIAGNOSIS — Z5112 Encounter for antineoplastic immunotherapy: Secondary | ICD-10-CM

## 2012-03-14 DIAGNOSIS — C20 Malignant neoplasm of rectum: Secondary | ICD-10-CM

## 2012-03-14 LAB — COMPREHENSIVE METABOLIC PANEL
AST: 18 U/L (ref 0–37)
Albumin: 3.8 g/dL (ref 3.5–5.2)
CO2: 26 mEq/L (ref 19–32)
Calcium: 8.8 mg/dL (ref 8.4–10.5)
Creatinine, Ser: 1.61 mg/dL — ABNORMAL HIGH (ref 0.50–1.35)
GFR calc non Af Amer: 47 mL/min — ABNORMAL LOW (ref >90)
Total Protein: 7.2 g/dL (ref 6.0–8.3)

## 2012-03-14 LAB — CBC WITH DIFFERENTIAL/PLATELET
Basophils Absolute: 0.1 10*3/uL (ref 0.0–0.1)
Basophils Relative: 1 % (ref 0–1)
Eosinophils Absolute: 0.4 10*3/uL (ref 0.0–0.7)
Eosinophils Relative: 6 % — ABNORMAL HIGH (ref 0–5)
HCT: 38.3 % — ABNORMAL LOW (ref 39.0–52.0)
MCHC: 33.7 g/dL (ref 30.0–36.0)
MCV: 86.5 fL (ref 78.0–100.0)
Monocytes Absolute: 0.6 10*3/uL (ref 0.1–1.0)
RDW: 17.2 % — ABNORMAL HIGH (ref 11.5–15.5)

## 2012-03-14 MED ORDER — CETUXIMAB CHEMO IV INJECTION 200 MG/100ML
250.0000 mg/m2 | Freq: Once | INTRAVENOUS | Status: AC
  Administered 2012-03-14: 600 mg via INTRAVENOUS
  Filled 2012-03-14: qty 300

## 2012-03-14 MED ORDER — HEPARIN SOD (PORK) LOCK FLUSH 100 UNIT/ML IV SOLN
500.0000 [IU] | Freq: Once | INTRAVENOUS | Status: AC | PRN
  Administered 2012-03-14: 500 [IU]
  Filled 2012-03-14: qty 5

## 2012-03-14 MED ORDER — DIPHENHYDRAMINE HCL 50 MG/ML IJ SOLN
INTRAMUSCULAR | Status: AC
  Filled 2012-03-14: qty 1

## 2012-03-14 MED ORDER — SODIUM CHLORIDE 0.9 % IV SOLN
Freq: Once | INTRAVENOUS | Status: AC
  Administered 2012-03-14: 09:00:00 via INTRAVENOUS

## 2012-03-14 MED ORDER — SODIUM CHLORIDE 0.9 % IJ SOLN
10.0000 mL | INTRAMUSCULAR | Status: DC | PRN
  Filled 2012-03-14: qty 10

## 2012-03-14 MED ORDER — DIPHENHYDRAMINE HCL 50 MG/ML IJ SOLN
50.0000 mg | Freq: Once | INTRAMUSCULAR | Status: AC
  Administered 2012-03-14: 50 mg via INTRAVENOUS

## 2012-03-14 MED ORDER — HEPARIN SOD (PORK) LOCK FLUSH 100 UNIT/ML IV SOLN
INTRAVENOUS | Status: AC
  Filled 2012-03-14: qty 5

## 2012-03-20 ENCOUNTER — Encounter (HOSPITAL_BASED_OUTPATIENT_CLINIC_OR_DEPARTMENT_OTHER): Payer: Managed Care, Other (non HMO) | Admitting: Oncology

## 2012-03-20 ENCOUNTER — Encounter (HOSPITAL_COMMUNITY): Payer: Self-pay | Admitting: Oncology

## 2012-03-20 VITALS — BP 137/82 | HR 71 | Temp 97.3°F | Resp 16 | Wt 215.6 lb

## 2012-03-20 DIAGNOSIS — C7800 Secondary malignant neoplasm of unspecified lung: Secondary | ICD-10-CM

## 2012-03-20 DIAGNOSIS — R21 Rash and other nonspecific skin eruption: Secondary | ICD-10-CM

## 2012-03-20 DIAGNOSIS — C20 Malignant neoplasm of rectum: Secondary | ICD-10-CM

## 2012-03-20 DIAGNOSIS — L270 Generalized skin eruption due to drugs and medicaments taken internally: Secondary | ICD-10-CM

## 2012-03-20 MED ORDER — HYDROCORTISONE 1 % EX OINT: TOPICAL_OINTMENT | Freq: Two times a day (BID) | CUTANEOUS | Status: DC

## 2012-03-20 MED ORDER — DOXYCYCLINE HYCLATE 100 MG PO TABS: 100.0000 mg | ORAL_TABLET | Freq: Two times a day (BID) | ORAL | Status: DC

## 2012-03-20 NOTE — Progress Notes (Signed)
[facility], MD [address]  [place] [address]  1. Rectal cancer     CURRENT THERAPY: S/P 4 cycles of 5FU/Leucovorin + Erbitux starting on 01/24/2012  INTERVAL HISTORY: Martin Maldonado 56 y.o. 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  He was restarted back on therapy on 01/24/2012 after a holiday in therapy after 12 cycles of 5FU/Leucovorin + Erbitux with cycle 12 on 10/12/2011.   He recently had a 2D echo by Dr. Diona Browner.  That report shows an EF of 55% and otherwise unremarkable.   I personally reviewed and went over laboratory results with the patient.  His labs are unremarkable other than his renal insufficiency which is stable to slightly improved over the past 4 weeks.   Martin Maldonado's only complaint is the rash from the Erbitux.  He reports that it is pruritic.  He also has dry skin.  Will will follow the recommendation for the rash treatment with Hydrocortisone 1 % and Doxycycline 100 mg BID x 4 weeks. He was encouraged to continue moisturizing lotion for dry skin.  He is taking Benadryl.   He otherwise is doing great.  He recently purchased a 2005 Ford Mustang.    Complete ROS questioning is negative.   He is tolerating therapy well.  He does not wish to have the pump over the New Year holiday, so we will move on to his next cycle next week.    Past Medical History  Diagnosis Date  . Hypertension   . Rectal cancer 2008    Adenocarcinoma; AP resection in 2008; recurrence with lung metastases in 2011  . Arteriosclerotic cardiovascular disease (ASCVD) 2011    CABG surgery in 2011  . Hyperlipidemia   . 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 Splenomegaly 01/23/2009    Additional GI history includes small bowel obstruction, requiring laparotomy and lysis of adhesions in 2009, remote peptic ulcer disease and postoperative perirectal infection  . ALCOHOL ABUSE, HX OF 01/23/2009      has HYPERLIPIDEMIA; Tobacco abuse, in remission; HYPERTENSION; COPD; SPLENOMEGALY; SMALL BOWEL OBSTRUCTION, HX OF; and Rectal cancer on his problem list.      has no known allergies.  Martin Maldonado had no medications administered during this visit.  Past Surgical History  Procedure Date  . Irrigation and debridement sebaceous cyst   . Laparoscopic lysis intestinal adhesions 2009    With incidental appendectomy  . Coronary artery bypass graft 2011    2 vessel  . Abdominoperineal proctocolectomy 2008  . Portacath placement 2011    Denies any headaches, dizziness, double vision, fevers, chills, night sweats, nausea, vomiting, diarrhea, constipation, chest pain, heart palpitations, shortness of breath, blood in stool, black tarry stool, urinary pain, urinary burning, urinary frequency, hematuria.   PHYSICAL EXAMINATION  ECOG PERFORMANCE STATUS: 1 - Symptomatic but completely ambulatory  Filed Vitals:   03/20/12 1100  BP: 137/82  Pulse: 71  Temp: 97.3 F (36.3 C)  Resp: 16    GENERAL:alert, no distress, well nourished, well developed, comfortable, cooperative and smiling SKIN: positive for: widespread erythematous rash that is blanchable and dry skin. HEAD: Normocephalic EYES: normal, Conjunctiva are pink and non-injected EARS: External ears normal OROPHARYNX:lips, buccal mucosa, and tongue normal and mucous membranes are moist  NECK: supple, no adenopathy, thyroid normal size, non-tender, without nodularity, trachea midline LYMPH:  no palpable lymphadenopathy BREAST:not examined LUNGS: clear to auscultation and percussion HEART:  regular rate & rhythm, no murmurs, no gallops, S1 normal and S2 normal ABDOMEN:abdomen soft, non-tender and normal bowel sounds BACK: Back symmetric, no curvature., No CVA tenderness EXTREMITIES:less then 2 second capillary refill, no joint deformities, effusion, or inflammation, no edema, no skin discoloration, no clubbing, no cyanosis  NEURO:  alert & oriented x 3 with fluent speech, no focal motor/sensory deficits, gait normal   LABORATORY DATA: CBC    Component Value Date/Time   WBC 7.1 03/14/2012 0835   RBC 4.43 03/14/2012 0835   HGB 12.9* 03/14/2012 0835   HCT 38.3* 03/14/2012 0835   PLT 211 03/14/2012 0835   MCV 86.5 03/14/2012 0835   MCH 29.1 03/14/2012 0835   MCHC 33.7 03/14/2012 0835   RDW 17.2* 03/14/2012 0835   LYMPHSABS 1.5 03/14/2012 0835   MONOABS 0.6 03/14/2012 0835   EOSABS 0.4 03/14/2012 0835   BASOSABS 0.1 03/14/2012 0835      Chemistry      Component Value Date/Time   NA 137 03/14/2012 0835   K 3.8 03/14/2012 0835   CL 101 03/14/2012 0835   CO2 26 03/14/2012 0835   BUN 14 03/14/2012 0835   CREATININE 1.61* 03/14/2012 0835      Component Value Date/Time   CALCIUM 8.8 03/14/2012 0835   ALKPHOS 129* 03/14/2012 0835   AST 18 03/14/2012 0835   ALT 24 03/14/2012 0835   BILITOT 0.3 03/14/2012 0835       RADIOGRAPHIC STUDIES:  02/28/2012  Transthoracic Echocardiography  Patient: Martin, Maldonado MR #: [id number] Study Date: 02/28/2012 Gender: M Age: 69 Height: 210.8cm Weight: 95.7kg BSA: 2.74m^2 Pt. Status: Room:  SONOGRAPHER Karrie Doffing ATTENDING Rothbart, Terrilee Files, Ashish Raelyn Mora REFERRING Martin Maldonado, Samara Deist PERFORMING Delma Freeze Penn cc:  ------------------------------------------------------------ LV EF: 55%  ------------------------------------------------------------ Indications: CAD of bypass graft 414.05.  ------------------------------------------------------------ History: PMH: Chronic obstructive pulmonary disease. PMH: rectal cancer, former smoker, hyperlipidemia Risk factors: Hypertension.  ------------------------------------------------------------ Study Conclusions  - Left ventricle: The cavity size was normal. Wall thickness was increased in a pattern of mild LVH. LV false tendon noted incidentally from region of  posteromedialwall to septum. The estimated ejection fraction was 55%. Although no diagnostic regional wall motion abnormality was identified, this possibility cannot be completely excluded on the basis of this study. The study is not technically sufficient to allow evaluation of LV diastolic function. - Mitral valve: Trivial regurgitation. - Left atrium: The atrium was mildly dilated. - Tricuspid valve: Trivial regurgitation. - Pericardium, extracardiac: There was no pericardial effusion.    ASSESSMENT:  1. Metastatic rectal cancer to lungs 2. CABG on 06/09/2009.  3. Chronic obstructive pulmonary disease from smoking x many years, though he has quit.  4. Small bowel obstruction requiring surgery in February 2009.  5. Rectal cancer surgery in February 2008 with 1 of 12 positive nodes with LVI and a 4 cm primary, status post 6 cycles of adjuvant oxaliplatin, capecitabine and Avastin since there was concerned he had postoperative disease left behind in the pelvis, and he also received postoperative radiation therapy with concomitant capecitabine at that time.  6. Chronic back pain.    PLAN:  1. I personally reviewed and went over laboratory results with the patient. 2. I personally reviewed and went over radiographic studies with the patient. 3. Follow-up with cardiology as directed 4. Rx for 1% Hydrocortisone lotion/ointment 5. Rx for Doxycycline 100 mg PO BID x 4 weeks 6. Continue with Benadryl 7. Continue with moisturizing lotion 8. Will move chemotherapy plan  to next week after the New Year's holiday 9. Pre-chemo labs 10. CT scan CAP with contrast at the end of Jan for restaging.  11. Return after CT scans for follow-up.   All questions were answered. The patient knows to call the clinic with any problems, questions or concerns. We can certainly see the patient much sooner if necessary.  Patient and plan will be discussed with Dr. Mariel Sleet in the near  future.  KEFALAS,THOMAS

## 2012-03-20 NOTE — Patient Instructions (Addendum)
[facility] Discharge Instructions  RECOMMENDATIONS MADE BY THE CONSULTANT AND ANY TEST RESULTS WILL BE SENT TO YOUR REFERRING PHYSICIAN.  EXAM FINDINGS BY THE PHYSICIAN TODAY AND SIGNS OR SYMPTOMS TO REPORT TO CLINIC OR PRIMARY PHYSICIAN: exam and discussion by PA.  Will continue with therapy next week and CT scans end of January  MEDICATIONS PRESCRIBED:  Doxycycline - take 1 twice daily  Topical steroid  INSTRUCTIONS GIVEN AND DISCUSSED: Report fevers, chills, uncontrolled nausea or vomiting.  SPECIAL INSTRUCTIONS/FOLLOW-UP: Return for follow-up after scans to see PA.  Thank you for choosing [facility] to provide your oncology and hematology care.  To afford each patient quality time with our providers, please arrive at least 15 minutes before your scheduled appointment time.  With your help, our goal is to use those 15 minutes to complete the necessary work-up to ensure our physicians have the information they need to help with your evaluation and healthcare recommendations.    Effective January 1st, 2014, we ask that you re-schedule your appointment with our physicians should you arrive 10 or more minutes late for your appointment.  We strive to give you quality time with our providers, and arriving late affects you and other patients whose appointments are after yours.    Again, thank you for choosing [facility].  Our hope is that these requests will decrease the amount of time that you wait before being seen by our physicians.       _____________________________________________________________  I acknowledge that I have been informed and understand all the instructions given to me and received a copy. I do not have anymore questions at this time but understand that I may call the Cancer Center at [facility] at [phone] during business hours should I have any further questions or need assistance in obtaining follow-up  care.

## 2012-03-29 ENCOUNTER — Other Ambulatory Visit (HOSPITAL_COMMUNITY): Payer: Self-pay

## 2012-03-29 ENCOUNTER — Encounter (HOSPITAL_COMMUNITY): Payer: Managed Care, Other (non HMO) | Attending: Oncology

## 2012-03-29 ENCOUNTER — Telehealth (HOSPITAL_COMMUNITY): Payer: Self-pay

## 2012-03-29 VITALS — Wt 214.8 lb

## 2012-03-29 DIAGNOSIS — C20 Malignant neoplasm of rectum: Secondary | ICD-10-CM

## 2012-03-29 DIAGNOSIS — Z5112 Encounter for antineoplastic immunotherapy: Secondary | ICD-10-CM

## 2012-03-29 DIAGNOSIS — C7800 Secondary malignant neoplasm of unspecified lung: Secondary | ICD-10-CM

## 2012-03-29 DIAGNOSIS — Z5111 Encounter for antineoplastic chemotherapy: Secondary | ICD-10-CM

## 2012-03-29 LAB — CBC WITH DIFFERENTIAL/PLATELET
Basophils Relative: 1 % (ref 0–1)
Eosinophils Absolute: 0.3 10*3/uL (ref 0.0–0.7)
Hemoglobin: 13.7 g/dL (ref 13.0–17.0)
MCH: 28.9 pg (ref 26.0–34.0)
MCHC: 33.2 g/dL (ref 30.0–36.0)
Monocytes Absolute: 0.8 10*3/uL (ref 0.1–1.0)
Monocytes Relative: 10 % (ref 3–12)
Neutrophils Relative %: 66 % (ref 43–77)
RDW: 18.4 % — ABNORMAL HIGH (ref 11.5–15.5)

## 2012-03-29 LAB — COMPREHENSIVE METABOLIC PANEL
Albumin: 3.8 g/dL (ref 3.5–5.2)
BUN: 17 mg/dL (ref 6–23)
Creatinine, Ser: 1.38 mg/dL — ABNORMAL HIGH (ref 0.50–1.35)
Potassium: 3.2 mEq/L — ABNORMAL LOW (ref 3.5–5.1)
Total Protein: 7.1 g/dL (ref 6.0–8.3)

## 2012-03-29 MED ORDER — LEUCOVORIN CALCIUM INJECTION 100 MG
20.0000 mg/m2 | Freq: Once | INTRAMUSCULAR | Status: AC
  Administered 2012-03-29: 44 mg via INTRAVENOUS
  Filled 2012-03-29: qty 2.2

## 2012-03-29 MED ORDER — SODIUM CHLORIDE 0.9 % IV SOLN
Freq: Once | INTRAVENOUS | Status: AC
  Administered 2012-03-29: 10:00:00 via INTRAVENOUS

## 2012-03-29 MED ORDER — SODIUM CHLORIDE 0.9 % IV SOLN
2400.0000 mg/m2 | INTRAVENOUS | Status: DC
  Administered 2012-03-29: 5300 mg via INTRAVENOUS
  Filled 2012-03-29 (×2): qty 106

## 2012-03-29 MED ORDER — CETUXIMAB CHEMO IV INJECTION 200 MG/100ML
250.0000 mg/m2 | Freq: Once | INTRAVENOUS | Status: AC
  Administered 2012-03-29: 600 mg via INTRAVENOUS
  Filled 2012-03-29: qty 300

## 2012-03-29 MED ORDER — DIPHENHYDRAMINE HCL 50 MG/ML IJ SOLN
INTRAMUSCULAR | Status: AC
  Filled 2012-03-29: qty 1

## 2012-03-29 MED ORDER — FLUOROURACIL CHEMO INJECTION 2.5 GM/50ML
400.0000 mg/m2 | Freq: Once | INTRAVENOUS | Status: AC
  Administered 2012-03-29: 900 mg via INTRAVENOUS
  Filled 2012-03-29: qty 18

## 2012-03-29 MED ORDER — DIPHENHYDRAMINE HCL 50 MG/ML IJ SOLN
50.0000 mg | Freq: Once | INTRAMUSCULAR | Status: AC
  Administered 2012-03-29: 50 mg via INTRAVENOUS

## 2012-03-29 MED ORDER — ONDANSETRON 8 MG/NS 50 ML IVPB: 8.0000 mg | Freq: Once | INTRAVENOUS | Status: DC

## 2012-03-29 MED ORDER — LORAZEPAM 2 MG/ML IJ SOLN
INTRAMUSCULAR | Status: AC
  Filled 2012-03-29: qty 1

## 2012-03-29 MED ORDER — SODIUM CHLORIDE 0.9 % IV SOLN
Freq: Once | INTRAVENOUS | Status: AC
  Administered 2012-03-29: 8 mg via INTRAVENOUS
  Filled 2012-03-29: qty 4

## 2012-03-29 MED ORDER — PROMETHAZINE HCL 25 MG PO TABS: 25.0000 mg | ORAL_TABLET | Freq: Four times a day (QID) | ORAL | Status: DC | PRN

## 2012-03-29 MED ORDER — POTASSIUM CHLORIDE CRYS ER 20 MEQ PO TBCR: 20.0000 meq | EXTENDED_RELEASE_TABLET | Freq: Every day | ORAL | Status: DC

## 2012-03-29 MED ORDER — LORAZEPAM 2 MG/ML IJ SOLN
0.5000 mg | Freq: Once | INTRAMUSCULAR | Status: AC
  Administered 2012-03-29: 0.5 mg via INTRAVENOUS

## 2012-03-29 MED ORDER — DEXAMETHASONE SODIUM PHOSPHATE 10 MG/ML IJ SOLN: 10.0000 mg | Freq: Once | INTRAMUSCULAR | Status: DC

## 2012-03-29 NOTE — Telephone Encounter (Signed)
Message left for patient that Kdur and Phenergan was escribed to his pharmacy.  Call back confirmation requested.

## 2012-03-29 NOTE — Telephone Encounter (Signed)
Message copied by Evelena Leyden on Wed Mar 29, 2012  5:22 PM ------      Message from: Mariel Sleet, ERIC S      Created: Wed Mar 29, 2012  5:04 PM       Refill [id number] meq #30 one a day Refill x 5      Refill phenergan X 6

## 2012-03-30 MED ORDER — HEPARIN SOD (PORK) LOCK FLUSH 100 UNIT/ML IV SOLN
INTRAVENOUS | Status: AC
  Filled 2012-03-30: qty 5

## 2012-03-31 ENCOUNTER — Encounter (HOSPITAL_BASED_OUTPATIENT_CLINIC_OR_DEPARTMENT_OTHER): Payer: Managed Care, Other (non HMO)

## 2012-03-31 DIAGNOSIS — C20 Malignant neoplasm of rectum: Secondary | ICD-10-CM

## 2012-03-31 DIAGNOSIS — Z452 Encounter for adjustment and management of vascular access device: Secondary | ICD-10-CM

## 2012-03-31 MED ORDER — HEPARIN SOD (PORK) LOCK FLUSH 100 UNIT/ML IV SOLN
INTRAVENOUS | Status: AC
  Filled 2012-03-31: qty 5

## 2012-03-31 MED ORDER — HEPARIN SOD (PORK) LOCK FLUSH 100 UNIT/ML IV SOLN
500.0000 [IU] | Freq: Once | INTRAVENOUS | Status: AC
  Administered 2012-03-31: 500 [IU] via INTRAVENOUS
  Filled 2012-03-31: qty 5

## 2012-03-31 MED ORDER — SODIUM CHLORIDE 0.9 % IJ SOLN
10.0000 mL | INTRAMUSCULAR | Status: DC | PRN
  Administered 2012-03-31: 10 mL via INTRAVENOUS
  Filled 2012-03-31: qty 10

## 2012-03-31 NOTE — Progress Notes (Signed)
Martin Maldonado presented for Portacath access and flush. Proper placement of portacath confirmed by CXR. Portacath located right chest wall accessed with  H 20 needle. Good blood return present. Portacath flushed with 20ml NS and 500U/71ml Heparin and needle removed intact. Procedure without incident. Patient tolerated procedure well.  Continuous infusion stopped.

## 2012-04-05 ENCOUNTER — Encounter (HOSPITAL_BASED_OUTPATIENT_CLINIC_OR_DEPARTMENT_OTHER): Payer: Managed Care, Other (non HMO)

## 2012-04-05 VITALS — BP 167/88 | HR 63 | Temp 97.8°F | Resp 18 | Wt 217.0 lb

## 2012-04-05 DIAGNOSIS — Z5112 Encounter for antineoplastic immunotherapy: Secondary | ICD-10-CM

## 2012-04-05 DIAGNOSIS — C20 Malignant neoplasm of rectum: Secondary | ICD-10-CM

## 2012-04-05 LAB — CBC WITH DIFFERENTIAL/PLATELET
Hemoglobin: 13.5 g/dL (ref 13.0–17.0)
Lymphs Abs: 2.2 10*3/uL (ref 0.7–4.0)
Monocytes Relative: 9 % (ref 3–12)
Neutro Abs: 6.4 10*3/uL (ref 1.7–7.7)
Neutrophils Relative %: 64 % (ref 43–77)
RBC: 4.57 MIL/uL (ref 4.22–5.81)

## 2012-04-05 MED ORDER — HEPARIN SOD (PORK) LOCK FLUSH 100 UNIT/ML IV SOLN
500.0000 [IU] | Freq: Once | INTRAVENOUS | Status: AC | PRN
  Administered 2012-04-05: 500 [IU]
  Filled 2012-04-05: qty 5

## 2012-04-05 MED ORDER — HEPARIN SOD (PORK) LOCK FLUSH 100 UNIT/ML IV SOLN
INTRAVENOUS | Status: AC
  Filled 2012-04-05: qty 5

## 2012-04-05 MED ORDER — DIPHENHYDRAMINE HCL 50 MG/ML IJ SOLN
INTRAMUSCULAR | Status: AC
  Filled 2012-04-05: qty 1

## 2012-04-05 MED ORDER — DIPHENHYDRAMINE HCL 50 MG/ML IJ SOLN
50.0000 mg | Freq: Once | INTRAMUSCULAR | Status: AC
  Administered 2012-04-05: 50 mg via INTRAVENOUS

## 2012-04-05 MED ORDER — CETUXIMAB CHEMO IV INJECTION 200 MG/100ML
250.0000 mg/m2 | Freq: Once | INTRAVENOUS | Status: AC
  Administered 2012-04-05: 600 mg via INTRAVENOUS
  Filled 2012-04-05: qty 300

## 2012-04-05 MED ORDER — SODIUM CHLORIDE 0.9 % IJ SOLN
10.0000 mL | INTRAMUSCULAR | Status: DC | PRN
  Administered 2012-04-05: 10 mL
  Filled 2012-04-05: qty 10

## 2012-04-05 MED ORDER — SODIUM CHLORIDE 0.9 % IV SOLN
Freq: Once | INTRAVENOUS | Status: AC
  Administered 2012-04-05: 09:00:00 via INTRAVENOUS

## 2012-04-10 ENCOUNTER — Other Ambulatory Visit: Payer: Self-pay | Admitting: Cardiology

## 2012-04-10 NOTE — Telephone Encounter (Signed)
rx sent to pharmacy by e-script  

## 2012-04-11 ENCOUNTER — Encounter (HOSPITAL_BASED_OUTPATIENT_CLINIC_OR_DEPARTMENT_OTHER): Payer: Managed Care, Other (non HMO)

## 2012-04-11 DIAGNOSIS — C20 Malignant neoplasm of rectum: Secondary | ICD-10-CM

## 2012-04-11 LAB — COMPREHENSIVE METABOLIC PANEL
Albumin: 4 g/dL (ref 3.5–5.2)
BUN: 21 mg/dL (ref 6–23)
Chloride: 104 mEq/L (ref 96–112)
Creatinine, Ser: 1.49 mg/dL — ABNORMAL HIGH (ref 0.50–1.35)
GFR calc non Af Amer: 52 mL/min — ABNORMAL LOW (ref >90)
Total Bilirubin: 0.4 mg/dL (ref 0.3–1.2)

## 2012-04-11 LAB — CBC WITH DIFFERENTIAL/PLATELET
Basophils Relative: 1 % (ref 0–1)
Eosinophils Relative: 5 % (ref 0–5)
HCT: 43.3 % (ref 39.0–52.0)
Hemoglobin: 14.2 g/dL (ref 13.0–17.0)
MCH: 29 pg (ref 26.0–34.0)
MCHC: 32.8 g/dL (ref 30.0–36.0)
MCV: 88.5 fL (ref 78.0–100.0)
Monocytes Absolute: 0.7 10*3/uL (ref 0.1–1.0)
Monocytes Relative: 8 % (ref 3–12)
Neutro Abs: 5.8 10*3/uL (ref 1.7–7.7)

## 2012-04-11 NOTE — Progress Notes (Signed)
Labs drawn today for cbc/diff,cmp 

## 2012-04-12 ENCOUNTER — Encounter (HOSPITAL_BASED_OUTPATIENT_CLINIC_OR_DEPARTMENT_OTHER): Payer: Managed Care, Other (non HMO)

## 2012-04-12 VITALS — BP 128/73 | HR 64 | Temp 97.0°F | Resp 18 | Wt 216.8 lb

## 2012-04-12 DIAGNOSIS — C20 Malignant neoplasm of rectum: Secondary | ICD-10-CM

## 2012-04-12 DIAGNOSIS — Z5111 Encounter for antineoplastic chemotherapy: Secondary | ICD-10-CM

## 2012-04-12 DIAGNOSIS — C7800 Secondary malignant neoplasm of unspecified lung: Secondary | ICD-10-CM

## 2012-04-12 DIAGNOSIS — Z5112 Encounter for antineoplastic immunotherapy: Secondary | ICD-10-CM

## 2012-04-12 MED ORDER — SODIUM CHLORIDE 0.9 % IV SOLN
2400.0000 mg/m2 | INTRAVENOUS | Status: DC
  Administered 2012-04-12: 5300 mg via INTRAVENOUS
  Filled 2012-04-12 (×2): qty 106

## 2012-04-12 MED ORDER — LORAZEPAM 2 MG/ML IJ SOLN
0.5000 mg | Freq: Once | INTRAMUSCULAR | Status: AC
  Administered 2012-04-12: 0.5 mg via INTRAVENOUS

## 2012-04-12 MED ORDER — DIPHENHYDRAMINE HCL 50 MG/ML IJ SOLN
INTRAMUSCULAR | Status: AC
  Filled 2012-04-12: qty 1

## 2012-04-12 MED ORDER — FLUOROURACIL CHEMO INJECTION 2.5 GM/50ML
400.0000 mg/m2 | Freq: Once | INTRAVENOUS | Status: AC
  Administered 2012-04-12: 900 mg via INTRAVENOUS
  Filled 2012-04-12: qty 18

## 2012-04-12 MED ORDER — SODIUM CHLORIDE 0.9 % IV SOLN: Freq: Once | INTRAVENOUS | Status: DC

## 2012-04-12 MED ORDER — DIPHENHYDRAMINE HCL 50 MG/ML IJ SOLN
50.0000 mg | Freq: Once | INTRAMUSCULAR | Status: AC
  Administered 2012-04-12: 50 mg via INTRAVENOUS

## 2012-04-12 MED ORDER — SODIUM CHLORIDE 0.9 % IV SOLN
Freq: Once | INTRAVENOUS | Status: AC
  Administered 2012-04-12: 8 mg via INTRAVENOUS
  Filled 2012-04-12: qty 4

## 2012-04-12 MED ORDER — CETUXIMAB CHEMO IV INJECTION 200 MG/100ML
250.0000 mg/m2 | Freq: Once | INTRAVENOUS | Status: AC
  Administered 2012-04-12: 600 mg via INTRAVENOUS
  Filled 2012-04-12: qty 300

## 2012-04-12 MED ORDER — SODIUM CHLORIDE 0.9 % IV SOLN
Freq: Once | INTRAVENOUS | Status: AC
  Administered 2012-04-12: 09:00:00 via INTRAVENOUS

## 2012-04-12 MED ORDER — LORAZEPAM 2 MG/ML IJ SOLN
INTRAMUSCULAR | Status: AC
  Filled 2012-04-12: qty 1

## 2012-04-12 MED ORDER — LEUCOVORIN CALCIUM INJECTION 100 MG
20.0000 mg/m2 | Freq: Once | INTRAMUSCULAR | Status: AC
  Administered 2012-04-12: 44 mg via INTRAVENOUS
  Filled 2012-04-12: qty 2.2

## 2012-04-14 ENCOUNTER — Encounter (HOSPITAL_BASED_OUTPATIENT_CLINIC_OR_DEPARTMENT_OTHER): Payer: Managed Care, Other (non HMO)

## 2012-04-14 DIAGNOSIS — C20 Malignant neoplasm of rectum: Secondary | ICD-10-CM

## 2012-04-14 MED ORDER — HEPARIN SOD (PORK) LOCK FLUSH 100 UNIT/ML IV SOLN
500.0000 [IU] | Freq: Once | INTRAVENOUS | Status: AC | PRN
  Administered 2012-04-14: 500 [IU]
  Filled 2012-04-14: qty 5

## 2012-04-14 MED ORDER — SODIUM CHLORIDE 0.9 % IJ SOLN
10.0000 mL | INTRAMUSCULAR | Status: DC | PRN
  Administered 2012-04-14: 10 mL
  Filled 2012-04-14: qty 10

## 2012-04-14 MED ORDER — HEPARIN SOD (PORK) LOCK FLUSH 100 UNIT/ML IV SOLN
INTRAVENOUS | Status: AC
  Filled 2012-04-14: qty 5

## 2012-04-14 NOTE — Progress Notes (Signed)
D/C Iv infusion pump. Flushed port per protocol. Pt denies any complaints.

## 2012-04-19 ENCOUNTER — Ambulatory Visit (HOSPITAL_COMMUNITY): Payer: Managed Care, Other (non HMO)

## 2012-04-19 ENCOUNTER — Ambulatory Visit (HOSPITAL_COMMUNITY)
Admission: RE | Admit: 2012-04-19 | Discharge: 2012-04-19 | Disposition: A | Discharge status: Home / Self Care | Payer: Managed Care, Other (non HMO) | Source: Ambulatory Visit | Attending: Oncology | Admitting: Oncology

## 2012-04-19 DIAGNOSIS — C7800 Secondary malignant neoplasm of unspecified lung: Secondary | ICD-10-CM | POA: Insufficient documentation

## 2012-04-19 DIAGNOSIS — N200 Calculus of kidney: Secondary | ICD-10-CM | POA: Insufficient documentation

## 2012-04-19 DIAGNOSIS — C20 Malignant neoplasm of rectum: Secondary | ICD-10-CM | POA: Insufficient documentation

## 2012-04-19 DIAGNOSIS — K8020 Calculus of gallbladder without cholecystitis without obstruction: Secondary | ICD-10-CM | POA: Insufficient documentation

## 2012-04-19 DIAGNOSIS — R599 Enlarged lymph nodes, unspecified: Secondary | ICD-10-CM | POA: Insufficient documentation

## 2012-04-19 MED ORDER — IOHEXOL 300 MG/ML  SOLN
100.0000 mL | Freq: Once | INTRAMUSCULAR | Status: AC | PRN
  Administered 2012-04-19: 100 mL via INTRAVENOUS

## 2012-04-20 ENCOUNTER — Encounter (HOSPITAL_BASED_OUTPATIENT_CLINIC_OR_DEPARTMENT_OTHER): Payer: Managed Care, Other (non HMO) | Admitting: Oncology

## 2012-04-20 VITALS — BP 164/89 | HR 68 | Temp 97.8°F | Resp 18 | Wt 218.6 lb

## 2012-04-20 DIAGNOSIS — C20 Malignant neoplasm of rectum: Secondary | ICD-10-CM

## 2012-04-20 DIAGNOSIS — J449 Chronic obstructive pulmonary disease, unspecified: Secondary | ICD-10-CM

## 2012-04-20 DIAGNOSIS — C7800 Secondary malignant neoplasm of unspecified lung: Secondary | ICD-10-CM

## 2012-04-20 DIAGNOSIS — G8929 Other chronic pain: Secondary | ICD-10-CM

## 2012-04-20 NOTE — Progress Notes (Signed)
[facility], MD [address]  [place] [address]  1. Rectal cancer  CT Chest W Contrast, CT Abdomen Pelvis W Contrast, CEA    CURRENT THERAPY:S/P 6 cycles of 5FU/Leucovorin + Erbitux starting on 01/24/2012   INTERVAL HISTORY: NASIRE REALI 57 y.o. 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 He was restarted back on therapy on 01/24/2012 after a holiday in therapy after 12 cycles of 5FU/Leucovorin + Erbitux with cycle 12 on 10/12/2011.   Sandro is here to review his most recent restaging scan.  Overall, the scans show that his cancer remains responsive to therapy.  There is interval improvement of his pulmonary disease.  The full report follows below.   Hezakiah is doing well.  He denies any complaints.  He denies a cough or hemoptysis.  He denies SOB or dyspnea.  He does admit to a rash secondary to Erbitux therapy, but it is improved.  Otherwise, he is tolerating therapy well without any nausea or vomiting.  His bowels are appropriately working without any blood or black stools.   He is willing to pursue another 3 cycles of chemotherapy as scheduled.  We will perform restaging scans after 3 more cycles and then discuss a holiday or continued therapy.  Oncologically, Erman denies any complaints.   Rodell reports that he was diagnosed in 2007/2008 and treated with surgery, chemotherapy, and then radiation.  He reports hi recurrence occurred about 2 years or so ago.    Past Medical History  Diagnosis Date  . Hypertension   . Rectal cancer 2008    Adenocarcinoma; AP resection in 2008; recurrence with lung metastases in 2011  . Arteriosclerotic cardiovascular disease (ASCVD) 2011    CABG surgery in 2011  . Hyperlipidemia   . 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 Splenomegaly 01/23/2009    Additional GI history includes small bowel obstruction, requiring laparotomy and lysis of  adhesions in 2009, remote peptic ulcer disease and postoperative perirectal infection  . ALCOHOL ABUSE, HX OF 01/23/2009    has HYPERLIPIDEMIA; Tobacco abuse, in remission; HYPERTENSION; COPD; SPLENOMEGALY; SMALL BOWEL OBSTRUCTION, HX OF; and Rectal cancer on his problem list.      has no known allergies.  Mr. Cotta had no medications administered during this visit.  Past Surgical History  Procedure Date  . Irrigation and debridement sebaceous cyst   . Laparoscopic lysis intestinal adhesions 2009    With incidental appendectomy  . Coronary artery bypass graft 2011    2 vessel  . Abdominoperineal proctocolectomy 2008  . Portacath placement 2011    Denies any headaches, dizziness, double vision, fevers, chills, night sweats, nausea, vomiting, diarrhea, constipation, chest pain, heart palpitations, shortness of breath, blood in stool, black tarry stool, urinary pain, urinary burning, urinary frequency, hematuria.   PHYSICAL EXAMINATION  ECOG PERFORMANCE STATUS: 1 - Symptomatic but completely ambulatory  Filed Vitals:   04/20/12 0900  BP: 164/89  Pulse: 68  Temp: 97.8 F (36.6 C)  Resp: 18    GENERAL:alert, no distress, well nourished, well developed, comfortable, cooperative and smiling SKIN: skin color, texture, turgor are normal, widespread blanchable erythematous rash.  HEAD: Normocephalic, No masses, lesions, tenderness or abnormalities EYES: normal, Conjunctiva are pink and non-injected EARS: External ears normal OROPHARYNX:mucous membranes are moist  NECK: supple, trachea midline LYMPH:  not examined BREAST:not examined LUNGS: clear to auscultation  HEART: regular rate & rhythm, no murmurs,  no gallops, S1 normal and S2 normal ABDOMEN:abdomen soft, non-tender, normal bowel sounds and colostomy appreciated BACK: Back symmetric, no curvature., No CVA tenderness EXTREMITIES:less then 2 second capillary refill, no joint deformities, effusion, or inflammation, no edema, no  skin discoloration, no clubbing, no cyanosis  NEURO: alert & oriented x 3 with fluent speech, no focal motor/sensory deficits, gait normal    LABORATORY DATA: CBC    Component Value Date/Time   WBC 9.4 04/11/2012 1038   RBC 4.89 04/11/2012 1038   HGB 14.2 04/11/2012 1038   HCT 43.3 04/11/2012 1038   PLT 234 04/11/2012 1038   MCV 88.5 04/11/2012 1038   MCH 29.0 04/11/2012 1038   MCHC 32.8 04/11/2012 1038   RDW 19.3* 04/11/2012 1038   LYMPHSABS 2.3 04/11/2012 1038   MONOABS 0.7 04/11/2012 1038   EOSABS 0.5 04/11/2012 1038   BASOSABS 0.1 04/11/2012 1038      Chemistry      Component Value Date/Time   NA 139 04/11/2012 1038   K 4.6 04/11/2012 1038   CL 104 04/11/2012 1038   CO2 25 04/11/2012 1038   BUN 21 04/11/2012 1038   CREATININE 1.49* 04/11/2012 1038      Component Value Date/Time   CALCIUM 9.4 04/11/2012 1038   ALKPHOS 134* 04/11/2012 1038   AST 23 04/11/2012 1038   ALT 31 04/11/2012 1038   BILITOT 0.4 04/11/2012 1038     Lab Results  Component Value Date   CEA 16.8* 03/29/2012      RADIOGRAPHIC STUDIES:  Ct Chest W Contrast  04/19/2012  *RADIOLOGY REPORT*  Clinical Data:  Follow-up metastatic rectal carcinoma.  Ongoing chemotherapy.  Restaging.  CT CHEST, ABDOMEN AND PELVIS WITH CONTRAST  Technique:  Multidetector CT imaging of the chest, abdomen and pelvis was performed following the standard protocol during bolus administration of intravenous contrast.  Contrast: OMNIPAQUE IOHEXOL 300 MG/ML  SOLN  Comparison:  01/17/2012  CT CHEST  Findings:  Left hilar lymphadenopathy has decreased in size, currently measuring 1.1 x 2.5 cm compared to 1.6 x 3.7 cm previously.  AP window lymphadenopathy is also decreased, currently measuring 1.1 x 1.5 cm compared to 2.4 x 2.7 cm previously.  Multiple bilateral pulmonary nodules are again seen, with most showing decrease in size since previous exam.  Index lesions in the superior segment of the right lower lobe now measures 2.1 x 2.1 cm compared  to 2.5 x 3.2 cm previously, and in the posterior left lower lobe now measuring 1.5 x 2.8 cm compared with 2.0 x 3.2 cm previously.  Previously seen left pleural effusion has resolved.  Multiple pleural based soft tissue masses are again seen. Majority of these are stable in size, however a few show mild interval increase in size. Examples include a mass in the left posterior pleural space on image 37 the currently measures 2.6 x 3.8 cm compared with 2.0 x 3.4 cm previously.  A second lesion in the left upper lateral pleural space on image 15 currently measures 1.4 x 2.6 cm compared to 1.3 x 2.2 cm previously.  IMPRESSION:  1.  Interval improvement in left hilar and mediastinal lymphadenopathy. 2.  Interval decrease in size of the majority of bilateral pulmonary metastases. 3.  Multiple left pleural metastases, the majority of which are stable, but a few of which show slight increase in size. 4.  Resolution of left pleural effusion.  CT ABDOMEN AND PELVIS  Findings:  Presacral soft tissue density and surgical clips are stable and  consistent with post-treatment changes.  No other soft tissue masses or lymphadenopathy seen within the pelvis or abdomen.  No liver masses are identified.  A tiny low attenuation lesion in the posterior hepatic lobe is stable and most likely benign. Cholelithiasis is again seen, without evidence of cholecystitis or biliary dilatation.  The pancreas, spleen, and adrenal glands are normal in appearance.  Mild diffuse right renal parenchymal atrophy is stable as well as tiny nonobstructing calculus in the lower pole.  No evidence of renal mass or hydronephrosis.  Left lower quadrant colostomy is again seen with small parastomal hernia containing only fat.  No evidence of bowel wall thickening or dilatation.  No evidence of inflammatory process or abnormal fluid collections. A small midline ventral hernia containing only fat is also stable.  IMPRESSION:  1.  Stable exam.  No evidence of  recurrent or metastatic carcinoma within the abdomen or pelvis. 2.  Stable cholelithiasis and right nephrolithiasis.  No acute findings.   Original Report Authenticated By: Myles Rosenthal, M.D.    Ct Abdomen Pelvis W Contrast  04/19/2012  *RADIOLOGY REPORT*  Clinical Data:  Follow-up metastatic rectal carcinoma.  Ongoing chemotherapy.  Restaging.  CT CHEST, ABDOMEN AND PELVIS WITH CONTRAST  Technique:  Multidetector CT imaging of the chest, abdomen and pelvis was performed following the standard protocol during bolus administration of intravenous contrast.  Contrast: OMNIPAQUE IOHEXOL 300 MG/ML  SOLN  Comparison:  01/17/2012  CT CHEST  Findings:  Left hilar lymphadenopathy has decreased in size, currently measuring 1.1 x 2.5 cm compared to 1.6 x 3.7 cm previously.  AP window lymphadenopathy is also decreased, currently measuring 1.1 x 1.5 cm compared to 2.4 x 2.7 cm previously.  Multiple bilateral pulmonary nodules are again seen, with most showing decrease in size since previous exam.  Index lesions in the superior segment of the right lower lobe now measures 2.1 x 2.1 cm compared to 2.5 x 3.2 cm previously, and in the posterior left lower lobe now measuring 1.5 x 2.8 cm compared with 2.0 x 3.2 cm previously.  Previously seen left pleural effusion has resolved.  Multiple pleural based soft tissue masses are again seen. Majority of these are stable in size, however a few show mild interval increase in size. Examples include a mass in the left posterior pleural space on image 37 the currently measures 2.6 x 3.8 cm compared with 2.0 x 3.4 cm previously.  A second lesion in the left upper lateral pleural space on image 15 currently measures 1.4 x 2.6 cm compared to 1.3 x 2.2 cm previously.  IMPRESSION:  1.  Interval improvement in left hilar and mediastinal lymphadenopathy. 2.  Interval decrease in size of the majority of bilateral pulmonary metastases. 3.  Multiple left pleural metastases, the majority of which  are stable, but a few of which show slight increase in size. 4.  Resolution of left pleural effusion.  CT ABDOMEN AND PELVIS  Findings:  Presacral soft tissue density and surgical clips are stable and consistent with post-treatment changes.  No other soft tissue masses or lymphadenopathy seen within the pelvis or abdomen.  No liver masses are identified.  A tiny low attenuation lesion in the posterior hepatic lobe is stable and most likely benign. Cholelithiasis is again seen, without evidence of cholecystitis or biliary dilatation.  The pancreas, spleen, and adrenal glands are normal in appearance.  Mild diffuse right renal parenchymal atrophy is stable as well as tiny nonobstructing calculus in the lower pole.  No evidence of renal mass or hydronephrosis.  Left lower quadrant colostomy is again seen with small parastomal hernia containing only fat.  No evidence of bowel wall thickening or dilatation.  No evidence of inflammatory process or abnormal fluid collections. A small midline ventral hernia containing only fat is also stable.  IMPRESSION:  1.  Stable exam.  No evidence of recurrent or metastatic carcinoma within the abdomen or pelvis. 2.  Stable cholelithiasis and right nephrolithiasis.  No acute findings.   Original Report Authenticated By: Myles Rosenthal, M.D.       ASSESSMENT:  1. Metastatic rectal cancer to lungs.  Most recent restaging scans show improvement in his metastatic disease and illustrates continued responsiveness of his cancer to therapy.  2. CABG on 06/09/2009.  3. Chronic obstructive pulmonary disease from smoking x many years, though he has quit.  4. Small bowel obstruction requiring surgery in February 2009.  5. Rectal cancer surgery in February 2008 with 1 of 12 positive nodes with LVI and a 4 cm primary, status post 6 cycles of adjuvant oxaliplatin, capecitabine and Avastin since there was concerned he had postoperative disease left behind in the pelvis, and he also received  postoperative radiation therapy with concomitant capecitabine at that time.  6. Chronic back pain.    PLAN:  1. I personally reviewed and went over radiographic studies with the patient. 2. I personally reviewed and went over laboratory results with the patient. 3. Continue with chemotherapy as scheduled.   4. Day 22 of Cetuximab of cycle 3 cancelled. 5. Chemotherapy plan signed and sent to scheduler to be scheduled.  6. Antibody plan signed 7. Added CEA every 4 weeks with chemotherapy for monitoring. 8. CT CAP with contrast after 3 more cycles (6 treatments) for restaging. 8. Return in 6 weeks for follow-up.   All questions were answered. The patient knows to call the clinic with any problems, questions or concerns. We can certainly see the patient much sooner if necessary.  Patient and plan will be discussed with Dr. Mariel Sleet in the near future.  Hargun Spurling

## 2012-04-20 NOTE — Patient Instructions (Signed)
[facility] Discharge Instructions  RECOMMENDATIONS MADE BY THE CONSULTANT AND ANY TEST RESULTS WILL BE SENT TO YOUR REFERRING PHYSICIAN.  EXAM FINDINGS BY THE PHYSICIAN TODAY AND SIGNS OR SYMPTOMS TO REPORT TO CLINIC OR PRIMARY PHYSICIAN: Exam findings as discussed by T. Kefalas, PA-C.  SPECIAL INSTRUCTIONS/FOLLOW-UP: 1.  Continue with current chemotherapy plan. 2.  Return to for an office visit in 6 weeks. 3.  You will be re-scanned after 6 treatments - after that you will see Dr. Mariel Sleet.  Thank you for choosing [facility] to provide your oncology and hematology care.  To afford each patient quality time with our providers, please arrive at least 15 minutes before your scheduled appointment time.  With your help, our goal is to use those 15 minutes to complete the necessary work-up to ensure our physicians have the information they need to help with your evaluation and healthcare recommendations.    Effective January 1st, 2014, we ask that you re-schedule your appointment with our physicians should you arrive 10 or more minutes late for your appointment.  We strive to give you quality time with our providers, and arriving late affects you and other patients whose appointments are after yours.    Again, thank you for choosing [facility].  Our hope is that these requests will decrease the amount of time that you wait before being seen by our physicians.       _____________________________________________________________  Should you have questions after your visit to [facility], please contact our office at [phone] between the hours of 8:30 a.m. and 5:00 p.m.  Voicemails left after 4:30 p.m. will not be returned until the following business day.  For prescription refill requests, have your pharmacy contact our office with your prescription refill request.

## 2012-04-24 ENCOUNTER — Inpatient Hospital Stay (HOSPITAL_COMMUNITY): Payer: Managed Care, Other (non HMO)

## 2012-04-24 ENCOUNTER — Other Ambulatory Visit (HOSPITAL_COMMUNITY): Payer: Self-pay | Admitting: Oncology

## 2012-04-26 ENCOUNTER — Encounter (HOSPITAL_COMMUNITY): Payer: Managed Care, Other (non HMO) | Attending: Oncology

## 2012-04-26 ENCOUNTER — Other Ambulatory Visit (HOSPITAL_COMMUNITY): Payer: Self-pay | Admitting: Oncology

## 2012-04-26 ENCOUNTER — Telehealth (HOSPITAL_COMMUNITY): Payer: Self-pay

## 2012-04-26 VITALS — BP 163/81 | HR 66 | Temp 97.0°F | Resp 20 | Wt 218.2 lb

## 2012-04-26 DIAGNOSIS — C20 Malignant neoplasm of rectum: Secondary | ICD-10-CM | POA: Insufficient documentation

## 2012-04-26 DIAGNOSIS — Z5112 Encounter for antineoplastic immunotherapy: Secondary | ICD-10-CM

## 2012-04-26 DIAGNOSIS — C7800 Secondary malignant neoplasm of unspecified lung: Secondary | ICD-10-CM

## 2012-04-26 LAB — CBC WITH DIFFERENTIAL/PLATELET
HCT: 40.7 % (ref 39.0–52.0)
Hemoglobin: 13.8 g/dL (ref 13.0–17.0)
Lymphocytes Relative: 20 % (ref 12–46)
Lymphs Abs: 1.6 10*3/uL (ref 0.7–4.0)
MCHC: 33.9 g/dL (ref 30.0–36.0)
Monocytes Absolute: 0.6 10*3/uL (ref 0.1–1.0)
Monocytes Relative: 7 % (ref 3–12)
Neutro Abs: 5.6 10*3/uL (ref 1.7–7.7)

## 2012-04-26 LAB — COMPREHENSIVE METABOLIC PANEL
BUN: 16 mg/dL (ref 6–23)
CO2: 24 mEq/L (ref 19–32)
Chloride: 104 mEq/L (ref 96–112)
Creatinine, Ser: 1.69 mg/dL — ABNORMAL HIGH (ref 0.50–1.35)
GFR calc non Af Amer: 44 mL/min — ABNORMAL LOW (ref >90)
Glucose, Bld: 110 mg/dL — ABNORMAL HIGH (ref 70–99)
Total Bilirubin: 0.4 mg/dL (ref 0.3–1.2)

## 2012-04-26 LAB — CEA: CEA: 14.9 ng/mL — ABNORMAL HIGH (ref 0.0–5.0)

## 2012-04-26 LAB — MAGNESIUM: Magnesium: 0.9 mg/dL — CL (ref 1.5–2.5)

## 2012-04-26 MED ORDER — MAGNESIUM SULFATE 50 % IJ SOLN: 2000.0000 mg | Freq: Once | INTRAVENOUS | Status: DC

## 2012-04-26 MED ORDER — LEUCOVORIN CALCIUM INJECTION 100 MG
20.0000 mg/m2 | Freq: Once | INTRAMUSCULAR | Status: AC
  Administered 2012-04-26: 44 mg via INTRAVENOUS
  Filled 2012-04-26: qty 2.2

## 2012-04-26 MED ORDER — DIPHENHYDRAMINE HCL 50 MG/ML IJ SOLN
50.0000 mg | Freq: Once | INTRAMUSCULAR | Status: AC
  Administered 2012-04-26: 50 mg via INTRAVENOUS

## 2012-04-26 MED ORDER — SODIUM CHLORIDE 0.9 % IV SOLN
Freq: Once | INTRAVENOUS | Status: AC
  Administered 2012-04-26: 8 mg via INTRAVENOUS
  Filled 2012-04-26: qty 4

## 2012-04-26 MED ORDER — CETUXIMAB CHEMO IV INJECTION 200 MG/100ML
250.0000 mg/m2 | Freq: Once | INTRAVENOUS | Status: AC
  Administered 2012-04-26: 600 mg via INTRAVENOUS
  Filled 2012-04-26: qty 300

## 2012-04-26 MED ORDER — MAGNESIUM SULFATE 40 MG/ML IJ SOLN
2.0000 g | Freq: Once | INTRAMUSCULAR | Status: AC
  Administered 2012-04-26: 2 g via INTRAVENOUS
  Filled 2012-04-26: qty 50

## 2012-04-26 MED ORDER — SODIUM CHLORIDE 0.9 % IV SOLN
Freq: Once | INTRAVENOUS | Status: AC
  Administered 2012-04-26: 10:00:00 via INTRAVENOUS

## 2012-04-26 MED ORDER — MAGNESIUM OXIDE 400 (241.3 MG) MG PO TABS: 400.0000 mg | ORAL_TABLET | Freq: Two times a day (BID) | ORAL | Status: DC

## 2012-04-26 MED ORDER — DIPHENHYDRAMINE HCL 50 MG/ML IJ SOLN
INTRAMUSCULAR | Status: AC
  Filled 2012-04-26: qty 1

## 2012-04-26 MED ORDER — SODIUM CHLORIDE 0.9 % IV SOLN
2400.0000 mg/m2 | INTRAVENOUS | Status: DC
  Administered 2012-04-26: 5300 mg via INTRAVENOUS
  Filled 2012-04-26 (×2): qty 106

## 2012-04-26 MED ORDER — LORAZEPAM 2 MG/ML IJ SOLN
INTRAMUSCULAR | Status: AC
  Filled 2012-04-26: qty 1

## 2012-04-26 MED ORDER — LORAZEPAM 2 MG/ML IJ SOLN
0.5000 mg | Freq: Once | INTRAMUSCULAR | Status: AC
  Administered 2012-04-26: 0.5 mg via INTRAVENOUS

## 2012-04-26 MED ORDER — FLUOROURACIL CHEMO INJECTION 2.5 GM/50ML
400.0000 mg/m2 | Freq: Once | INTRAVENOUS | Status: AC
  Administered 2012-04-26: 900 mg via INTRAVENOUS
  Filled 2012-04-26: qty 18

## 2012-04-26 NOTE — Telephone Encounter (Signed)
2 g Mag IV today.  Re-check next week with chemo.  Order placed.

## 2012-04-26 NOTE — Telephone Encounter (Signed)
CRITICAL VALUE ALERT Critical value received:  Magnesium level 0.9 Date of notification:  04/26/12 Time of notification: 1050 Critical value read back:  yes Nurse who received alert:  Tobie Lords, RN MD notified (1st page):  Dellis Anes, [place]

## 2012-04-28 ENCOUNTER — Encounter (HOSPITAL_BASED_OUTPATIENT_CLINIC_OR_DEPARTMENT_OTHER): Payer: Managed Care, Other (non HMO)

## 2012-04-28 DIAGNOSIS — C20 Malignant neoplasm of rectum: Secondary | ICD-10-CM

## 2012-04-28 DIAGNOSIS — Z452 Encounter for adjustment and management of vascular access device: Secondary | ICD-10-CM

## 2012-04-28 DIAGNOSIS — C7800 Secondary malignant neoplasm of unspecified lung: Secondary | ICD-10-CM

## 2012-04-28 MED ORDER — HEPARIN SOD (PORK) LOCK FLUSH 100 UNIT/ML IV SOLN
INTRAVENOUS | Status: AC
  Filled 2012-04-28: qty 5

## 2012-04-28 MED ORDER — HEPARIN SOD (PORK) LOCK FLUSH 100 UNIT/ML IV SOLN
500.0000 [IU] | Freq: Once | INTRAVENOUS | Status: AC | PRN
  Administered 2012-04-28: 500 [IU]
  Filled 2012-04-28: qty 5

## 2012-04-28 MED ORDER — SODIUM CHLORIDE 0.9 % IJ SOLN
10.0000 mL | INTRAMUSCULAR | Status: DC | PRN
  Administered 2012-04-28: 10 mL
  Filled 2012-04-28: qty 10

## 2012-04-28 NOTE — Progress Notes (Signed)
D/C continuous infusion pump. Port flushed per protocol. Denies complaints.

## 2012-05-01 ENCOUNTER — Other Ambulatory Visit (HOSPITAL_COMMUNITY): Payer: Self-pay | Admitting: Oncology

## 2012-05-02 ENCOUNTER — Encounter (HOSPITAL_COMMUNITY): Payer: Managed Care, Other (non HMO)

## 2012-05-02 ENCOUNTER — Other Ambulatory Visit (HOSPITAL_COMMUNITY): Payer: Self-pay | Admitting: Oncology

## 2012-05-02 DIAGNOSIS — C20 Malignant neoplasm of rectum: Secondary | ICD-10-CM

## 2012-05-02 LAB — CBC WITH DIFFERENTIAL/PLATELET
Basophils Absolute: 0 10*3/uL (ref 0.0–0.1)
Basophils Relative: 1 % (ref 0–1)
Eosinophils Absolute: 0.2 10*3/uL (ref 0.0–0.7)
MCHC: 33.4 g/dL (ref 30.0–36.0)
Monocytes Absolute: 0.4 10*3/uL (ref 0.1–1.0)
Neutro Abs: 4.1 10*3/uL (ref 1.7–7.7)
Neutrophils Relative %: 61 % (ref 43–77)
RDW: 19.8 % — ABNORMAL HIGH (ref 11.5–15.5)

## 2012-05-02 NOTE — Progress Notes (Unsigned)
Martin Maldonado presented for labwork. Labs per MD order drawn via Peripheral Line 23 gauge needle inserted in right AC  Good blood return present. Procedure without incident.  Needle removed intact. Patient tolerated procedure well.

## 2012-05-03 ENCOUNTER — Encounter (HOSPITAL_BASED_OUTPATIENT_CLINIC_OR_DEPARTMENT_OTHER): Payer: Managed Care, Other (non HMO)

## 2012-05-03 VITALS — BP 149/83 | HR 67 | Temp 96.6°F | Resp 20 | Wt 216.4 lb

## 2012-05-03 DIAGNOSIS — Z5112 Encounter for antineoplastic immunotherapy: Secondary | ICD-10-CM

## 2012-05-03 DIAGNOSIS — C7800 Secondary malignant neoplasm of unspecified lung: Secondary | ICD-10-CM

## 2012-05-03 DIAGNOSIS — C20 Malignant neoplasm of rectum: Secondary | ICD-10-CM

## 2012-05-03 MED ORDER — HEPARIN SOD (PORK) LOCK FLUSH 100 UNIT/ML IV SOLN
INTRAVENOUS | Status: AC
  Filled 2012-05-03: qty 5

## 2012-05-03 MED ORDER — MAGNESIUM SULFATE 40 MG/ML IJ SOLN
2.0000 g | Freq: Once | INTRAMUSCULAR | Status: AC
  Administered 2012-05-03: 2 g via INTRAVENOUS
  Filled 2012-05-03: qty 50

## 2012-05-03 MED ORDER — HEPARIN SOD (PORK) LOCK FLUSH 100 UNIT/ML IV SOLN
500.0000 [IU] | Freq: Once | INTRAVENOUS | Status: AC | PRN
  Administered 2012-05-03: 500 [IU]
  Filled 2012-05-03: qty 5

## 2012-05-03 MED ORDER — MAGNESIUM SULFATE 50 % IJ SOLN: 2000.0000 mg | Freq: Once | INTRAVENOUS | Status: DC

## 2012-05-03 MED ORDER — DIPHENHYDRAMINE HCL 50 MG/ML IJ SOLN
50.0000 mg | Freq: Once | INTRAMUSCULAR | Status: AC
  Administered 2012-05-03: 50 mg via INTRAVENOUS

## 2012-05-03 MED ORDER — CETUXIMAB CHEMO IV INJECTION 200 MG/100ML
250.0000 mg/m2 | Freq: Once | INTRAVENOUS | Status: AC
  Administered 2012-05-03: 600 mg via INTRAVENOUS
  Filled 2012-05-03: qty 300

## 2012-05-03 MED ORDER — SODIUM CHLORIDE 0.9 % IV SOLN
Freq: Once | INTRAVENOUS | Status: AC
  Administered 2012-05-03: 09:00:00 via INTRAVENOUS

## 2012-05-03 MED ORDER — LORAZEPAM 2 MG/ML IJ SOLN
0.5000 mg | Freq: Once | INTRAMUSCULAR | Status: AC
  Administered 2012-05-03: 0.5 mg via INTRAVENOUS

## 2012-05-09 ENCOUNTER — Encounter (HOSPITAL_BASED_OUTPATIENT_CLINIC_OR_DEPARTMENT_OTHER): Payer: Managed Care, Other (non HMO)

## 2012-05-09 ENCOUNTER — Other Ambulatory Visit (HOSPITAL_COMMUNITY): Payer: Self-pay | Admitting: Oncology

## 2012-05-09 DIAGNOSIS — C7800 Secondary malignant neoplasm of unspecified lung: Secondary | ICD-10-CM

## 2012-05-09 DIAGNOSIS — C20 Malignant neoplasm of rectum: Secondary | ICD-10-CM

## 2012-05-09 LAB — CBC WITH DIFFERENTIAL/PLATELET
Basophils Absolute: 0.1 10*3/uL (ref 0.0–0.1)
Eosinophils Absolute: 0.3 10*3/uL (ref 0.0–0.7)
Lymphs Abs: 1.9 10*3/uL (ref 0.7–4.0)
MCH: 30.8 pg (ref 26.0–34.0)
Neutrophils Relative %: 65 % (ref 43–77)
Platelets: 185 10*3/uL (ref 150–400)
RBC: 4.45 MIL/uL (ref 4.22–5.81)
RDW: 20.2 % — ABNORMAL HIGH (ref 11.5–15.5)
WBC: 8 10*3/uL (ref 4.0–10.5)

## 2012-05-09 LAB — COMPREHENSIVE METABOLIC PANEL
BUN: 22 mg/dL (ref 6–23)
CO2: 24 mEq/L (ref 19–32)
Chloride: 103 mEq/L (ref 96–112)
Creatinine, Ser: 1.84 mg/dL — ABNORMAL HIGH (ref 0.50–1.35)
GFR calc non Af Amer: 40 mL/min — ABNORMAL LOW (ref >90)
Potassium: 4.5 mEq/L (ref 3.5–5.1)
Sodium: 139 mEq/L (ref 135–145)
Total Bilirubin: 0.3 mg/dL (ref 0.3–1.2)

## 2012-05-09 LAB — MAGNESIUM: Magnesium: 1.3 mg/dL — ABNORMAL LOW (ref 1.5–2.5)

## 2012-05-09 NOTE — Progress Notes (Signed)
Labs drawn today for cbc/diff,cmp,mg 

## 2012-05-10 ENCOUNTER — Encounter (HOSPITAL_BASED_OUTPATIENT_CLINIC_OR_DEPARTMENT_OTHER): Payer: Managed Care, Other (non HMO)

## 2012-05-10 VITALS — BP 158/75 | HR 67 | Temp 97.4°F | Resp 18 | Wt 215.8 lb

## 2012-05-10 DIAGNOSIS — C20 Malignant neoplasm of rectum: Secondary | ICD-10-CM

## 2012-05-10 MED ORDER — SODIUM CHLORIDE 0.9 % IV SOLN
Freq: Once | INTRAVENOUS | Status: AC
  Administered 2012-05-10: 8 mg via INTRAVENOUS
  Filled 2012-05-10: qty 4

## 2012-05-10 MED ORDER — LORAZEPAM 2 MG/ML IJ SOLN
INTRAMUSCULAR | Status: AC
  Filled 2012-05-10: qty 1

## 2012-05-10 MED ORDER — SODIUM CHLORIDE 0.9 % IV SOLN
Freq: Once | INTRAVENOUS | Status: AC
  Administered 2012-05-10: 09:00:00 via INTRAVENOUS

## 2012-05-10 MED ORDER — LORAZEPAM 2 MG/ML IJ SOLN
0.5000 mg | Freq: Once | INTRAMUSCULAR | Status: AC
  Administered 2012-05-10: 0.5 mg via INTRAVENOUS

## 2012-05-10 MED ORDER — DIPHENHYDRAMINE HCL 50 MG/ML IJ SOLN
50.0000 mg | Freq: Once | INTRAMUSCULAR | Status: AC
  Administered 2012-05-10: 50 mg via INTRAVENOUS

## 2012-05-10 MED ORDER — SODIUM CHLORIDE 0.9 % IJ SOLN
10.0000 mL | INTRAMUSCULAR | Status: DC | PRN
  Administered 2012-05-10: 10 mL
  Filled 2012-05-10: qty 10

## 2012-05-10 MED ORDER — LEUCOVORIN CALCIUM INJECTION 100 MG
20.0000 mg/m2 | Freq: Once | INTRAMUSCULAR | Status: AC
  Administered 2012-05-10: 44 mg via INTRAVENOUS
  Filled 2012-05-10: qty 2.2

## 2012-05-10 MED ORDER — HEPARIN SOD (PORK) LOCK FLUSH 100 UNIT/ML IV SOLN
500.0000 [IU] | Freq: Once | INTRAVENOUS | Status: DC | PRN
  Filled 2012-05-10: qty 5

## 2012-05-10 MED ORDER — FLUOROURACIL CHEMO INJECTION 2.5 GM/50ML
400.0000 mg/m2 | Freq: Once | INTRAVENOUS | Status: AC
  Administered 2012-05-10: 900 mg via INTRAVENOUS
  Filled 2012-05-10: qty 18

## 2012-05-10 MED ORDER — DIPHENHYDRAMINE HCL 50 MG/ML IJ SOLN
INTRAMUSCULAR | Status: AC
  Filled 2012-05-10: qty 1

## 2012-05-10 MED ORDER — MAGNESIUM SULFATE 50 % IJ SOLN: 2000.0000 mg | Freq: Once | INTRAVENOUS | Status: DC

## 2012-05-10 MED ORDER — CETUXIMAB CHEMO IV INJECTION 200 MG/100ML
250.0000 mg/m2 | Freq: Once | INTRAVENOUS | Status: AC
  Administered 2012-05-10: 600 mg via INTRAVENOUS
  Filled 2012-05-10: qty 300

## 2012-05-10 MED ORDER — MAGNESIUM SULFATE 40 MG/ML IJ SOLN
2.0000 g | Freq: Once | INTRAMUSCULAR | Status: AC
  Administered 2012-05-10: 2 g via INTRAVENOUS
  Filled 2012-05-10: qty 50

## 2012-05-10 MED ORDER — SODIUM CHLORIDE 0.9 % IV SOLN
2400.0000 mg/m2 | INTRAVENOUS | Status: DC
  Administered 2012-05-10: 5300 mg via INTRAVENOUS
  Filled 2012-05-10 (×2): qty 106

## 2012-05-10 NOTE — Progress Notes (Signed)
Tolerated chemo well. Continuous infusion pump dosage and rate verified by Flonnie Overman, RN

## 2012-05-12 ENCOUNTER — Encounter (HOSPITAL_BASED_OUTPATIENT_CLINIC_OR_DEPARTMENT_OTHER): Payer: Managed Care, Other (non HMO)

## 2012-05-12 VITALS — BP 146/84 | HR 77 | Temp 97.6°F | Resp 20

## 2012-05-12 DIAGNOSIS — C7800 Secondary malignant neoplasm of unspecified lung: Secondary | ICD-10-CM

## 2012-05-12 DIAGNOSIS — C20 Malignant neoplasm of rectum: Secondary | ICD-10-CM

## 2012-05-12 DIAGNOSIS — Z452 Encounter for adjustment and management of vascular access device: Secondary | ICD-10-CM

## 2012-05-12 MED ORDER — HEPARIN SOD (PORK) LOCK FLUSH 100 UNIT/ML IV SOLN
INTRAVENOUS | Status: AC
  Filled 2012-05-12: qty 5

## 2012-05-12 MED ORDER — SODIUM CHLORIDE 0.9 % IJ SOLN
10.0000 mL | INTRAMUSCULAR | Status: DC | PRN
  Administered 2012-05-12: 10 mL
  Filled 2012-05-12: qty 10

## 2012-05-12 MED ORDER — HEPARIN SOD (PORK) LOCK FLUSH 100 UNIT/ML IV SOLN
500.0000 [IU] | Freq: Once | INTRAVENOUS | Status: AC | PRN
  Administered 2012-05-12: 500 [IU]
  Filled 2012-05-12: qty 5

## 2012-05-12 NOTE — Progress Notes (Signed)
D/C continuous infusion pump. Flushed port per protocol. Pt denies any complaints at present.

## 2012-05-17 ENCOUNTER — Encounter (HOSPITAL_BASED_OUTPATIENT_CLINIC_OR_DEPARTMENT_OTHER): Payer: Managed Care, Other (non HMO)

## 2012-05-17 DIAGNOSIS — Z5112 Encounter for antineoplastic immunotherapy: Secondary | ICD-10-CM

## 2012-05-17 LAB — CBC WITH DIFFERENTIAL/PLATELET
Basophils Relative: 0 % (ref 0–1)
Eosinophils Absolute: 0 10*3/uL (ref 0.0–0.7)
HCT: 39.2 % (ref 39.0–52.0)
MCV: 90.5 fL (ref 78.0–100.0)
Monocytes Relative: 3 % (ref 3–12)
RBC: 4.33 MIL/uL (ref 4.22–5.81)
RDW: 18.9 % — ABNORMAL HIGH (ref 11.5–15.5)
WBC: 7.4 10*3/uL (ref 4.0–10.5)

## 2012-05-17 LAB — MAGNESIUM: Magnesium: 1.4 mg/dL — ABNORMAL LOW (ref 1.5–2.5)

## 2012-05-17 MED ORDER — DIPHENHYDRAMINE HCL 50 MG/ML IJ SOLN
50.0000 mg | Freq: Once | INTRAMUSCULAR | Status: AC
  Administered 2012-05-17: 50 mg via INTRAVENOUS

## 2012-05-17 MED ORDER — DIPHENHYDRAMINE HCL 50 MG/ML IJ SOLN
INTRAMUSCULAR | Status: AC
  Filled 2012-05-17: qty 1

## 2012-05-17 MED ORDER — HEPARIN SOD (PORK) LOCK FLUSH 100 UNIT/ML IV SOLN
500.0000 [IU] | Freq: Once | INTRAVENOUS | Status: AC | PRN
  Administered 2012-05-17: 500 [IU]
  Filled 2012-05-17: qty 5

## 2012-05-17 MED ORDER — HEPARIN SOD (PORK) LOCK FLUSH 100 UNIT/ML IV SOLN
INTRAVENOUS | Status: AC
  Filled 2012-05-17: qty 5

## 2012-05-17 MED ORDER — LORAZEPAM 2 MG/ML IJ SOLN
0.5000 mg | Freq: Once | INTRAMUSCULAR | Status: AC
  Administered 2012-05-17: 0.5 mg via INTRAVENOUS

## 2012-05-17 MED ORDER — LORAZEPAM 2 MG/ML IJ SOLN
INTRAMUSCULAR | Status: AC
  Filled 2012-05-17: qty 1

## 2012-05-17 MED ORDER — CETUXIMAB CHEMO IV INJECTION 200 MG/100ML
250.0000 mg/m2 | Freq: Once | INTRAVENOUS | Status: AC
  Administered 2012-05-17: 600 mg via INTRAVENOUS
  Filled 2012-05-17: qty 300

## 2012-05-17 MED ORDER — SODIUM CHLORIDE 0.9 % IV SOLN
Freq: Once | INTRAVENOUS | Status: AC
  Administered 2012-05-17: 10:00:00 via INTRAVENOUS

## 2012-05-23 ENCOUNTER — Other Ambulatory Visit (HOSPITAL_COMMUNITY): Payer: Self-pay | Admitting: Oncology

## 2012-05-23 ENCOUNTER — Encounter (HOSPITAL_COMMUNITY): Payer: Managed Care, Other (non HMO) | Attending: Oncology

## 2012-05-23 DIAGNOSIS — R21 Rash and other nonspecific skin eruption: Secondary | ICD-10-CM | POA: Insufficient documentation

## 2012-05-23 DIAGNOSIS — C7800 Secondary malignant neoplasm of unspecified lung: Secondary | ICD-10-CM

## 2012-05-23 DIAGNOSIS — C20 Malignant neoplasm of rectum: Secondary | ICD-10-CM

## 2012-05-23 LAB — CBC
Platelets: 195 10*3/uL (ref 150–400)
RBC: 4.53 MIL/uL (ref 4.22–5.81)
RDW: 20 % — ABNORMAL HIGH (ref 11.5–15.5)
WBC: 8.5 10*3/uL (ref 4.0–10.5)

## 2012-05-23 LAB — DIFFERENTIAL
Basophils Absolute: 0.1 10*3/uL (ref 0.0–0.1)
Lymphocytes Relative: 22 % (ref 12–46)
Lymphs Abs: 1.8 10*3/uL (ref 0.7–4.0)
Neutro Abs: 5.5 10*3/uL (ref 1.7–7.7)
Neutrophils Relative %: 66 % (ref 43–77)

## 2012-05-23 LAB — COMPREHENSIVE METABOLIC PANEL
Alkaline Phosphatase: 134 U/L — ABNORMAL HIGH (ref 39–117)
BUN: 27 mg/dL — ABNORMAL HIGH (ref 6–23)
Chloride: 102 mEq/L (ref 96–112)
Creatinine, Ser: 1.76 mg/dL — ABNORMAL HIGH (ref 0.50–1.35)
GFR calc Af Amer: 48 mL/min — ABNORMAL LOW (ref >90)
Glucose, Bld: 95 mg/dL (ref 70–99)
Potassium: 4.6 mEq/L (ref 3.5–5.1)
Total Bilirubin: 0.3 mg/dL (ref 0.3–1.2)

## 2012-05-23 LAB — MAGNESIUM: Magnesium: 1.3 mg/dL — ABNORMAL LOW (ref 1.5–2.5)

## 2012-05-23 NOTE — Progress Notes (Signed)
Labs drawn today for cbc/diff,cmp,mg 

## 2012-05-24 ENCOUNTER — Encounter (HOSPITAL_BASED_OUTPATIENT_CLINIC_OR_DEPARTMENT_OTHER): Payer: Managed Care, Other (non HMO)

## 2012-05-24 VITALS — BP 177/94 | HR 63 | Temp 97.4°F | Resp 18 | Wt 216.0 lb

## 2012-05-24 MED ORDER — FLUOROURACIL CHEMO INJECTION 2.5 GM/50ML
400.0000 mg/m2 | Freq: Once | INTRAVENOUS | Status: AC
  Administered 2012-05-24: 900 mg via INTRAVENOUS
  Filled 2012-05-24: qty 18

## 2012-05-24 MED ORDER — LEUCOVORIN CALCIUM INJECTION 100 MG
20.0000 mg/m2 | Freq: Once | INTRAMUSCULAR | Status: AC
  Administered 2012-05-24: 44 mg via INTRAVENOUS
  Filled 2012-05-24: qty 2.2

## 2012-05-24 MED ORDER — SODIUM CHLORIDE 0.9 % IV SOLN
Freq: Once | INTRAVENOUS | Status: AC
  Administered 2012-05-24: 09:00:00 via INTRAVENOUS

## 2012-05-24 MED ORDER — SODIUM CHLORIDE 0.9 % IV SOLN
Freq: Once | INTRAVENOUS | Status: AC
  Administered 2012-05-24: 8 mg via INTRAVENOUS
  Filled 2012-05-24: qty 4

## 2012-05-24 MED ORDER — LORAZEPAM 2 MG/ML IJ SOLN
0.5000 mg | Freq: Once | INTRAMUSCULAR | Status: AC
  Administered 2012-05-24: 0.5 mg via INTRAVENOUS

## 2012-05-24 MED ORDER — CETUXIMAB CHEMO IV INJECTION 200 MG/100ML
250.0000 mg/m2 | Freq: Once | INTRAVENOUS | Status: AC
  Administered 2012-05-24: 600 mg via INTRAVENOUS
  Filled 2012-05-24: qty 300

## 2012-05-24 MED ORDER — DIPHENHYDRAMINE HCL 50 MG/ML IJ SOLN
50.0000 mg | Freq: Once | INTRAMUSCULAR | Status: AC
  Administered 2012-05-24: 50 mg via INTRAVENOUS

## 2012-05-24 MED ORDER — SODIUM CHLORIDE 0.9 % IV SOLN
2400.0000 mg/m2 | INTRAVENOUS | Status: DC
  Administered 2012-05-24: 5300 mg via INTRAVENOUS
  Filled 2012-05-24 (×2): qty 106

## 2012-05-24 MED ORDER — LORAZEPAM 2 MG/ML IJ SOLN
INTRAMUSCULAR | Status: AC
  Filled 2012-05-24: qty 1

## 2012-05-24 MED ORDER — DIPHENHYDRAMINE HCL 50 MG/ML IJ SOLN
INTRAMUSCULAR | Status: AC
  Filled 2012-05-24: qty 1

## 2012-05-24 NOTE — Progress Notes (Signed)
Per T.Kefalas, PA, instruct pt to continue Magnesium as prescribed, he does not need magnesium infusion today.

## 2012-05-26 ENCOUNTER — Encounter (HOSPITAL_COMMUNITY): Payer: Managed Care, Other (non HMO)

## 2012-05-26 MED ORDER — HEPARIN SOD (PORK) LOCK FLUSH 100 UNIT/ML IV SOLN
INTRAVENOUS | Status: AC
  Filled 2012-05-26: qty 5

## 2012-05-26 NOTE — Progress Notes (Signed)
Martin Maldonado presented for port-a-cath deaccess and discontinuation of home infusion pump.  Ozzie Hoyle, RN Supervisor deaccessed pt's port w/o incident using heparin and saline.  Patient to follow-up as scheduled.

## 2012-05-29 ENCOUNTER — Other Ambulatory Visit (HOSPITAL_COMMUNITY): Payer: Self-pay | Admitting: Oncology

## 2012-05-31 ENCOUNTER — Encounter (HOSPITAL_BASED_OUTPATIENT_CLINIC_OR_DEPARTMENT_OTHER): Payer: Managed Care, Other (non HMO)

## 2012-05-31 VITALS — BP 151/76 | HR 90 | Temp 97.7°F | Resp 18 | Wt 216.6 lb

## 2012-05-31 DIAGNOSIS — Z5112 Encounter for antineoplastic immunotherapy: Secondary | ICD-10-CM

## 2012-05-31 DIAGNOSIS — C20 Malignant neoplasm of rectum: Secondary | ICD-10-CM

## 2012-05-31 LAB — CBC WITH DIFFERENTIAL/PLATELET
Basophils Relative: 1 % (ref 0–1)
HCT: 39.3 % (ref 39.0–52.0)
Hemoglobin: 13.5 g/dL (ref 13.0–17.0)
Lymphs Abs: 1.8 10*3/uL (ref 0.7–4.0)
MCH: 32.1 pg (ref 26.0–34.0)
MCHC: 34.4 g/dL (ref 30.0–36.0)
Monocytes Absolute: 0.5 10*3/uL (ref 0.1–1.0)
Monocytes Relative: 7 % (ref 3–12)
Neutro Abs: 4.1 10*3/uL (ref 1.7–7.7)
Neutrophils Relative %: 61 % (ref 43–77)
RBC: 4.21 MIL/uL — ABNORMAL LOW (ref 4.22–5.81)

## 2012-05-31 MED ORDER — DIPHENHYDRAMINE HCL 50 MG/ML IJ SOLN
50.0000 mg | Freq: Once | INTRAMUSCULAR | Status: AC
  Administered 2012-05-31: 50 mg via INTRAVENOUS

## 2012-05-31 MED ORDER — HEPARIN SOD (PORK) LOCK FLUSH 100 UNIT/ML IV SOLN
500.0000 [IU] | Freq: Once | INTRAVENOUS | Status: AC | PRN
  Administered 2012-05-31: 500 [IU]
  Filled 2012-05-31: qty 5

## 2012-05-31 MED ORDER — DIPHENHYDRAMINE HCL 50 MG/ML IJ SOLN
INTRAMUSCULAR | Status: AC
  Filled 2012-05-31: qty 1

## 2012-05-31 MED ORDER — MAGNESIUM SULFATE 50 % IJ SOLN: 4000.0000 mg | Freq: Once | INTRAVENOUS | Status: DC

## 2012-05-31 MED ORDER — MAGNESIUM SULFATE 40 MG/ML IJ SOLN
4.0000 g | Freq: Once | INTRAMUSCULAR | Status: AC
  Administered 2012-05-31: 4 g via INTRAVENOUS
  Filled 2012-05-31: qty 100

## 2012-05-31 MED ORDER — SODIUM CHLORIDE 0.9 % IV SOLN
Freq: Once | INTRAVENOUS | Status: AC
  Administered 2012-05-31: 14:00:00 via INTRAVENOUS

## 2012-05-31 MED ORDER — LORAZEPAM 2 MG/ML IJ SOLN
0.5000 mg | Freq: Once | INTRAMUSCULAR | Status: AC
  Administered 2012-05-31: 0.5 mg via INTRAVENOUS

## 2012-05-31 MED ORDER — SODIUM CHLORIDE 0.9 % IJ SOLN
10.0000 mL | INTRAMUSCULAR | Status: DC | PRN
  Filled 2012-05-31: qty 10

## 2012-05-31 MED ORDER — HEPARIN SOD (PORK) LOCK FLUSH 100 UNIT/ML IV SOLN
INTRAVENOUS | Status: AC
  Filled 2012-05-31: qty 5

## 2012-05-31 MED ORDER — CETUXIMAB CHEMO IV INJECTION 200 MG/100ML
250.0000 mg/m2 | Freq: Once | INTRAVENOUS | Status: AC
  Administered 2012-05-31: 600 mg via INTRAVENOUS
  Filled 2012-05-31: qty 300

## 2012-05-31 MED ORDER — LORAZEPAM 2 MG/ML IJ SOLN
INTRAMUSCULAR | Status: AC
  Filled 2012-05-31: qty 1

## 2012-05-31 NOTE — Progress Notes (Signed)
CRITICAL VALUE ALERT  Critical value received:  Magnesium-0.8  Date of notification:  05/31/2012  Time of notification:  1530  Critical value read back:yes  Nurse who received alert:  Flonnie Overman, RN  MD notified (1st page):  Dellis Anes, PA  Time of first page:  [phone]    Responding MD:  Dellis Anes, PA  Time MD responded:  1530  Patient to receive Magnesium IV today per PA order.

## 2012-05-31 NOTE — Progress Notes (Signed)
Tolerated well

## 2012-06-01 ENCOUNTER — Ambulatory Visit (HOSPITAL_COMMUNITY): Payer: Managed Care, Other (non HMO)

## 2012-06-06 ENCOUNTER — Encounter (HOSPITAL_BASED_OUTPATIENT_CLINIC_OR_DEPARTMENT_OTHER): Payer: Managed Care, Other (non HMO)

## 2012-06-06 ENCOUNTER — Other Ambulatory Visit (HOSPITAL_COMMUNITY): Payer: Self-pay | Admitting: Oncology

## 2012-06-06 LAB — CBC WITH DIFFERENTIAL/PLATELET
Basophils Absolute: 0.1 10*3/uL (ref 0.0–0.1)
Basophils Relative: 1 % (ref 0–1)
Eosinophils Absolute: 0.4 10*3/uL (ref 0.0–0.7)
Eosinophils Relative: 4 % (ref 0–5)
HCT: 41.7 % (ref 39.0–52.0)
Hemoglobin: 14.2 g/dL (ref 13.0–17.0)
MCH: 32.1 pg (ref 26.0–34.0)
MCHC: 34.1 g/dL (ref 30.0–36.0)
MCV: 94.3 fL (ref 78.0–100.0)
Monocytes Absolute: 0.6 10*3/uL (ref 0.1–1.0)
Monocytes Relative: 7 % (ref 3–12)
RDW: 18.3 % — ABNORMAL HIGH (ref 11.5–15.5)

## 2012-06-06 LAB — COMPREHENSIVE METABOLIC PANEL
ALT: 29 U/L (ref 0–53)
AST: 20 U/L (ref 0–37)
Albumin: 3.8 g/dL (ref 3.5–5.2)
Alkaline Phosphatase: 136 U/L — ABNORMAL HIGH (ref 39–117)
CO2: 24 mEq/L (ref 19–32)
Chloride: 103 mEq/L (ref 96–112)
Creatinine, Ser: 1.67 mg/dL — ABNORMAL HIGH (ref 0.50–1.35)
GFR calc non Af Amer: 45 mL/min — ABNORMAL LOW (ref >90)
Potassium: 4.1 mEq/L (ref 3.5–5.1)
Total Bilirubin: 0.3 mg/dL (ref 0.3–1.2)

## 2012-06-06 LAB — MAGNESIUM: Magnesium: 1.1 mg/dL — ABNORMAL LOW (ref 1.5–2.5)

## 2012-06-06 NOTE — Progress Notes (Signed)
Labs drawn today for cbc/diff,mg,cea,cmp 

## 2012-06-07 ENCOUNTER — Encounter (HOSPITAL_BASED_OUTPATIENT_CLINIC_OR_DEPARTMENT_OTHER): Payer: Managed Care, Other (non HMO)

## 2012-06-07 ENCOUNTER — Encounter (HOSPITAL_BASED_OUTPATIENT_CLINIC_OR_DEPARTMENT_OTHER): Payer: Managed Care, Other (non HMO) | Admitting: Oncology

## 2012-06-07 VITALS — BP 165/86 | HR 70 | Temp 97.4°F | Resp 16 | Wt 215.8 lb

## 2012-06-07 DIAGNOSIS — G8929 Other chronic pain: Secondary | ICD-10-CM

## 2012-06-07 DIAGNOSIS — C7800 Secondary malignant neoplasm of unspecified lung: Secondary | ICD-10-CM

## 2012-06-07 DIAGNOSIS — R110 Nausea: Secondary | ICD-10-CM

## 2012-06-07 DIAGNOSIS — C20 Malignant neoplasm of rectum: Secondary | ICD-10-CM

## 2012-06-07 MED ORDER — DIPHENHYDRAMINE HCL 50 MG/ML IJ SOLN: 50.0000 mg | Freq: Once | INTRAMUSCULAR | Status: DC

## 2012-06-07 MED ORDER — SODIUM CHLORIDE 0.9 % IV SOLN
Freq: Once | INTRAVENOUS | Status: AC
  Administered 2012-06-07: 8 mg via INTRAVENOUS
  Filled 2012-06-07: qty 4

## 2012-06-07 MED ORDER — CETUXIMAB CHEMO IV INJECTION 200 MG/100ML
250.0000 mg/m2 | Freq: Once | INTRAVENOUS | Status: AC
  Administered 2012-06-07: 600 mg via INTRAVENOUS
  Filled 2012-06-07: qty 300

## 2012-06-07 MED ORDER — MAGNESIUM SULFATE 40 MG/ML IJ SOLN
4.0000 g | Freq: Once | INTRAMUSCULAR | Status: AC
  Administered 2012-06-07: 4 g via INTRAVENOUS
  Filled 2012-06-07: qty 100

## 2012-06-07 MED ORDER — SODIUM CHLORIDE 0.9 % IV SOLN
2400.0000 mg/m2 | INTRAVENOUS | Status: DC
  Administered 2012-06-07: 5300 mg via INTRAVENOUS
  Filled 2012-06-07 (×2): qty 106

## 2012-06-07 MED ORDER — DEXTROSE 5 % IV SOLN: 4000.0000 mg | Freq: Once | INTRAVENOUS | Status: DC

## 2012-06-07 MED ORDER — ONDANSETRON HCL 8 MG PO TABS: 8.0000 mg | ORAL_TABLET | Freq: Three times a day (TID) | ORAL | Status: DC | PRN

## 2012-06-07 MED ORDER — SODIUM CHLORIDE 0.9 % IV SOLN
Freq: Once | INTRAVENOUS | Status: AC
  Administered 2012-06-07: 10:00:00 via INTRAVENOUS

## 2012-06-07 MED ORDER — FLUOROURACIL CHEMO INJECTION 2.5 GM/50ML
400.0000 mg/m2 | Freq: Once | INTRAVENOUS | Status: AC
  Administered 2012-06-07: 900 mg via INTRAVENOUS
  Filled 2012-06-07: qty 18

## 2012-06-07 MED ORDER — LEUCOVORIN CALCIUM INJECTION 100 MG
20.0000 mg/m2 | Freq: Once | INTRAMUSCULAR | Status: AC
  Administered 2012-06-07: 44 mg via INTRAVENOUS
  Filled 2012-06-07: qty 2.2

## 2012-06-07 MED ORDER — SODIUM CHLORIDE 0.9 % IJ SOLN
10.0000 mL | INTRAMUSCULAR | Status: DC | PRN
  Administered 2012-06-07: 10 mL
  Filled 2012-06-07: qty 10

## 2012-06-07 MED ORDER — LORAZEPAM 2 MG/ML IJ SOLN: 0.5000 mg | Freq: Once | INTRAMUSCULAR | Status: DC

## 2012-06-07 MED ORDER — HEPARIN SOD (PORK) LOCK FLUSH 100 UNIT/ML IV SOLN
500.0000 [IU] | Freq: Once | INTRAVENOUS | Status: DC | PRN
  Filled 2012-06-07: qty 5

## 2012-06-07 NOTE — Patient Instructions (Addendum)
.  [facility] Discharge Instructions  RECOMMENDATIONS MADE BY THE CONSULTANT AND ANY TEST RESULTS WILL BE SENT TO YOUR REFERRING PHYSICIAN.  EXAM FINDINGS BY THE PHYSICIAN TODAY AND SIGNS OR SYMPTOMS TO REPORT TO CLINIC OR PRIMARY PHYSICIAN: Exam per Elijah Birk  MEDICATIONS PRESCRIBED:  Rx given for Zofran. If you are still nauseous 30-45 min after taking this, use your ativan or phenergan  INSTRUCTIONS GIVEN AND DISCUSSED: SPECIAL INSTRUCTIONS/FOLLOW-UP: To see Dr. Mariel Sleet after CT scans  Thank you for choosing [facility] to provide your oncology and hematology care.  To afford each patient quality time with our providers, please arrive at least 15 minutes before your scheduled appointment time.  With your help, our goal is to use those 15 minutes to complete the necessary work-up to ensure our physicians have the information they need to help with your evaluation and healthcare recommendations.    Effective January 1st, 2014, we ask that you re-schedule your appointment with our physicians should you arrive 10 or more minutes late for your appointment.  We strive to give you quality time with our providers, and arriving late affects you and other patients whose appointments are after yours.    Again, thank you for choosing [facility].  Our hope is that these requests will decrease the amount of time that you wait before being seen by our physicians.       _____________________________________________________________  Should you have questions after your visit to [facility], please contact our office at [phone] between the hours of 8:30 a.m. and 5:00 p.m.  Voicemails left after 4:30 p.m. will not be returned until the following business day.  For prescription refill requests, have your pharmacy contact our office with your prescription refill request.

## 2012-06-07 NOTE — Progress Notes (Signed)
Tolerated chemo well.  Home with continuous infusion pump.

## 2012-06-07 NOTE — Progress Notes (Signed)
[facility], MD [address]  [place] [address]  Rectal cancer  CURRENT THERAPY: S/P 9 cycles of 5FU/Leucovorin + Erbitux starting on 01/24/2012   INTERVAL HISTORY: Martin Maldonado 57 y.o. 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 He was restarted back on therapy on 01/24/2012 after a holiday in therapy after 12 cycles of 5FU/Leucovorin + Erbitux with cycle 12 on 10/12/2011.   Now back on systemic chemotherapy with 5FU/Leucovorin + Erbitux after progression on CT scan.  This was started on 01/24/2012.  Martin Maldonado is doing well.  He does admit to some increased nausea, but no vomiting.   He is otherwise tolerating therapy well.  He is getting IV Magnesium 4,000 mg today for a low magnesium level.   He continues to have a rash from the Erbitux but it is the dry skin that bothers him the most.  He is using moisturizing lotion.  Oncologically, he denies any complaints and ROS questioning is negative.   Past Medical History  Diagnosis Date  . Hypertension   . Rectal cancer 2008    Adenocarcinoma; AP resection in 2008; recurrence with lung metastases in 2011  . Arteriosclerotic cardiovascular disease (ASCVD) 2011    CABG surgery in 2011  . Hyperlipidemia   . 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 Splenomegaly 01/23/2009    Additional GI history includes small bowel obstruction, requiring laparotomy and lysis of adhesions in 2009, remote peptic ulcer disease and postoperative perirectal infection  . ALCOHOL ABUSE, HX OF 01/23/2009    has HYPERLIPIDEMIA; Tobacco abuse, in remission; HYPERTENSION; COPD; SPLENOMEGALY; SMALL BOWEL OBSTRUCTION, HX OF; and Rectal cancer on his problem list.     has No Known Allergies.  Martin Maldonado does not currently have medications on file.  Past Surgical History  Procedure Laterality Date  . Irrigation and debridement sebaceous cyst    . Laparoscopic  lysis intestinal adhesions  2009    With incidental appendectomy  . Coronary artery bypass graft  2011    2 vessel  . Abdominoperineal proctocolectomy  2008  . Portacath placement  2011    Denies any headaches, dizziness, double vision, fevers, chills, night sweats, nausea, vomiting, diarrhea, constipation, chest pain, heart palpitations, shortness of breath, blood in stool, black tarry stool, urinary pain, urinary burning, urinary frequency, hematuria.   PHYSICAL EXAMINATION  ECOG PERFORMANCE STATUS: 1 - Symptomatic but completely ambulatory  There were no vitals filed for this visit.  GENERAL:alert, no distress, well nourished, well developed, comfortable, cooperative and smiling.  Patient seen in chemotherapy room. SKIN: skin color, texture, turgor are normal, widespread blanchable erythematous rash. Thickened skin on UE. HEAD: Normocephalic, No masses, lesions, tenderness or abnormalities  EYES: normal, Conjunctiva are pink and non-injected  EARS: External ears normal  OROPHARYNX:mucous membranes are moist  NECK: supple, trachea midline  LYMPH: not examined  BREAST:not examined  LUNGS: clear to auscultation and percussion. HEART: regular rate & rhythm, no murmurs, no gallops, S1 normal and S2 normal  ABDOMEN:abdomen soft, non-tender, normal bowel sounds and colostomy appreciated  BACK: Back symmetric, no curvature., No CVA tenderness  EXTREMITIES:less then 2 second capillary refill, no joint deformities, effusion, or inflammation, no edema, no clubbing, no cyanosis  NEURO: alert & oriented x 3 with fluent speech, no focal motor/sensory deficits, gait normal     LABORATORY DATA: CBC    Component Value Date/Time   WBC 8.3 06/06/2012  0954   RBC 4.42 06/06/2012 0954   HGB 14.2 06/06/2012 0954   HCT 41.7 06/06/2012 0954   PLT 181 06/06/2012 0954   MCV 94.3 06/06/2012 0954   MCH 32.1 06/06/2012 0954   MCHC 34.1 06/06/2012 0954   RDW 18.3* 06/06/2012 0954   LYMPHSABS 1.7 06/06/2012  0954   MONOABS 0.6 06/06/2012 0954   EOSABS 0.4 06/06/2012 0954   BASOSABS 0.1 06/06/2012 0954      Chemistry      Component Value Date/Time   NA 140 06/06/2012 0954   K 4.1 06/06/2012 0954   CL 103 06/06/2012 0954   CO2 24 06/06/2012 0954   BUN 22 06/06/2012 0954   CREATININE 1.67* 06/06/2012 0954      Component Value Date/Time   CALCIUM 9.3 06/06/2012 0954   ALKPHOS 136* 06/06/2012 0954   AST 20 06/06/2012 0954   ALT 29 06/06/2012 0954   BILITOT 0.3 06/06/2012 0954     Lab Results  Component Value Date   CEA 12.3* 06/06/2012       ASSESSMENT:  1. 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 He was restarted back on therapy on 01/24/2012 after a holiday in therapy after 12 cycles of 5FU/Leucovorin + Erbitux with cycle 12 on 10/12/2011.   Now back on systemic chemotherapy with 5FU/Leucovorin + Erbitux after progression on CT scan.  This was started on 01/24/2012. He continues to illustrate responsiveness of his cancer to this therapy.  2. CABG on 06/09/2009.  3. Chronic obstructive pulmonary disease from smoking x many years, though he has quit.  4. Small bowel obstruction requiring surgery in February 2009.  5. Rectal cancer surgery in February 2008 with 1 of 12 positive nodes with LVI and a 4 cm primary, status post 6 cycles of adjuvant oxaliplatin, capecitabine and Avastin since there was concerned he had postoperative disease left behind in the pelvis, and he also received postoperative radiation therapy with concomitant capecitabine at that time.  6. Chronic back pain.     PLAN:  1. I personally reviewed and went over laboratory results with the patient. 2. Pre-chemo labs: CBC diff, CMET, Magnesium, CEA 3. Rx for Zofran 8 mg every 8 hours PRN nausea/vomiting 4. May take Ativan or Phenergan if nausea is not better after 1 hour of taking Zofran. 5. Continue with PO Mag Oxide 6. CT scans are scheduled for 07/14/12 for restaging purposes 7. Return after CT scans.   All  questions were answered. The patient knows to call the clinic with any problems, questions or concerns. We can certainly see the patient much sooner if necessary.  The patient and plan discussed with Glenford Peers, MD and he is in agreement with the aforementioned.  KEFALAS,THOMAS

## 2012-06-09 ENCOUNTER — Encounter (HOSPITAL_BASED_OUTPATIENT_CLINIC_OR_DEPARTMENT_OTHER): Payer: Managed Care, Other (non HMO)

## 2012-06-09 DIAGNOSIS — C7800 Secondary malignant neoplasm of unspecified lung: Secondary | ICD-10-CM

## 2012-06-09 DIAGNOSIS — C20 Malignant neoplasm of rectum: Secondary | ICD-10-CM

## 2012-06-09 DIAGNOSIS — Z452 Encounter for adjustment and management of vascular access device: Secondary | ICD-10-CM

## 2012-06-09 MED ORDER — HEPARIN SOD (PORK) LOCK FLUSH 100 UNIT/ML IV SOLN
INTRAVENOUS | Status: AC
  Filled 2012-06-09: qty 5

## 2012-06-09 MED ORDER — SODIUM CHLORIDE 0.9 % IJ SOLN
10.0000 mL | INTRAMUSCULAR | Status: DC | PRN
  Administered 2012-06-09: 10 mL via INTRAVENOUS
  Filled 2012-06-09: qty 10

## 2012-06-09 MED ORDER — HEPARIN SOD (PORK) LOCK FLUSH 100 UNIT/ML IV SOLN
500.0000 [IU] | Freq: Once | INTRAVENOUS | Status: AC
  Administered 2012-06-09: 500 [IU] via INTRAVENOUS
  Filled 2012-06-09: qty 5

## 2012-06-09 NOTE — Progress Notes (Signed)
Martin Maldonado presented for Portacath access and flush. Proper placement of portacath confirmed by CXR. Portacath located right  chest wall accessed with  H 20 needle. Good blood return present. Portacath flushed with 20ml NS and 500U/88ml Heparin and needle removed intact. Procedure without incident. Patient tolerated procedure well.

## 2012-06-14 ENCOUNTER — Encounter (HOSPITAL_BASED_OUTPATIENT_CLINIC_OR_DEPARTMENT_OTHER): Payer: Managed Care, Other (non HMO)

## 2012-06-14 DIAGNOSIS — C20 Malignant neoplasm of rectum: Secondary | ICD-10-CM

## 2012-06-14 DIAGNOSIS — R21 Rash and other nonspecific skin eruption: Secondary | ICD-10-CM

## 2012-06-14 DIAGNOSIS — C252 Malignant neoplasm of tail of pancreas: Secondary | ICD-10-CM

## 2012-06-14 DIAGNOSIS — Z5112 Encounter for antineoplastic immunotherapy: Secondary | ICD-10-CM

## 2012-06-14 DIAGNOSIS — E291 Testicular hypofunction: Secondary | ICD-10-CM

## 2012-06-14 LAB — CBC WITH DIFFERENTIAL/PLATELET
Basophils Absolute: 0 10*3/uL (ref 0.0–0.1)
Eosinophils Relative: 8 % — ABNORMAL HIGH (ref 0–5)
Lymphocytes Relative: 27 % (ref 12–46)
Neutro Abs: 2.2 10*3/uL (ref 1.7–7.7)
Neutrophils Relative %: 54 % (ref 43–77)
Platelets: 151 10*3/uL (ref 150–400)
RBC: 4.16 MIL/uL — ABNORMAL LOW (ref 4.22–5.81)
RDW: 17.4 % — ABNORMAL HIGH (ref 11.5–15.5)
WBC: 4.2 10*3/uL (ref 4.0–10.5)

## 2012-06-14 LAB — MAGNESIUM: Magnesium: 1 mg/dL — ABNORMAL LOW (ref 1.5–2.5)

## 2012-06-14 MED ORDER — SODIUM CHLORIDE 0.9 % IJ SOLN
10.0000 mL | INTRAMUSCULAR | Status: DC | PRN
  Administered 2012-06-14: 10 mL
  Filled 2012-06-14: qty 10

## 2012-06-14 MED ORDER — MAGNESIUM SULFATE 50 % IJ SOLN: 4000.0000 mg | Freq: Once | INTRAVENOUS | Status: DC

## 2012-06-14 MED ORDER — HEPARIN SOD (PORK) LOCK FLUSH 100 UNIT/ML IV SOLN
INTRAVENOUS | Status: AC
  Filled 2012-06-14: qty 5

## 2012-06-14 MED ORDER — CEPHALEXIN 500 MG PO CAPS: 500.0000 mg | ORAL_CAPSULE | Freq: Three times a day (TID) | ORAL | Status: DC

## 2012-06-14 MED ORDER — HEPARIN SOD (PORK) LOCK FLUSH 100 UNIT/ML IV SOLN
500.0000 [IU] | Freq: Once | INTRAVENOUS | Status: AC | PRN
  Administered 2012-06-14: 500 [IU]
  Filled 2012-06-14: qty 5

## 2012-06-14 MED ORDER — SODIUM CHLORIDE 0.9 % IV SOLN
Freq: Once | INTRAVENOUS | Status: AC
  Administered 2012-06-14: 250 mL via INTRAVENOUS

## 2012-06-14 MED ORDER — DIPHENHYDRAMINE HCL 50 MG/ML IJ SOLN
50.0000 mg | Freq: Once | INTRAMUSCULAR | Status: AC
  Administered 2012-06-14: 50 mg via INTRAVENOUS

## 2012-06-14 MED ORDER — MAGNESIUM SULFATE 40 MG/ML IJ SOLN
4.0000 g | Freq: Once | INTRAMUSCULAR | Status: AC
  Administered 2012-06-14: 4 g via INTRAVENOUS
  Filled 2012-06-14: qty 100

## 2012-06-14 MED ORDER — CETUXIMAB CHEMO IV INJECTION 200 MG/100ML
250.0000 mg/m2 | Freq: Once | INTRAVENOUS | Status: AC
  Administered 2012-06-14: 600 mg via INTRAVENOUS
  Filled 2012-06-14: qty 300

## 2012-06-14 MED ORDER — DIPHENHYDRAMINE HCL 50 MG/ML IJ SOLN
INTRAMUSCULAR | Status: AC
  Filled 2012-06-14: qty 1

## 2012-06-14 MED ORDER — LORAZEPAM 2 MG/ML IJ SOLN
0.5000 mg | Freq: Once | INTRAMUSCULAR | Status: AC
  Administered 2012-06-14: 0.5 mg via INTRAVENOUS

## 2012-06-14 MED ORDER — LORAZEPAM 2 MG/ML IJ SOLN
INTRAMUSCULAR | Status: AC
  Filled 2012-06-14: qty 1

## 2012-06-14 NOTE — Progress Notes (Signed)
Martin Maldonado is noted to have a mild acneform rash to his face, torso, and upper extremities starting within this week - similar to when he has received Erbitux in the past.  Dr. Mariel Sleet informed of same and advised that patient may continue with treatment today, and that he needs to resume topical antibiotic (Clindagel) and begin taking Keflex.  Both prescriptions were called in to [facility] and Martin Maldonado is aware to start taking and why.  Martin Maldonado tolerated infusions well and without incident; verbalizes understanding for follow-up.  No distress noted at time of discharge and patient was discharged home by himself.

## 2012-06-14 NOTE — Patient Instructions (Signed)
[facility] Discharge Instructions  RECOMMENDATIONS MADE BY THE CONSULTANT AND ANY TEST RESULTS WILL BE SENT TO YOUR REFERRING PHYSICIAN.  MEDICATIONS PRESCRIBED:  1.  Keflex as prescribed - called in to [facility]. 2.  Restart Clindagel - refill called in to [facility] 3.  Benadryl as needed for itching.  Thank you for choosing [facility] to provide your oncology and hematology care.  To afford each patient quality time with our providers, please arrive at least 15 minutes before your scheduled appointment time.  With your help, our goal is to use those 15 minutes to complete the necessary work-up to ensure our physicians have the information they need to help with your evaluation and healthcare recommendations.    Effective January 1st, 2014, we ask that you re-schedule your appointment with our physicians should you arrive 10 or more minutes late for your appointment.  We strive to give you quality time with our providers, and arriving late affects you and other patients whose appointments are after yours.    Again, thank you for choosing [facility].  Our hope is that these requests will decrease the amount of time that you wait before being seen by our physicians.       _____________________________________________________________  Should you have questions after your visit to [facility], please contact our office at [phone] between the hours of 8:30 a.m. and 5:00 p.m.  Voicemails left after 4:30 p.m. will not be returned until the following business day.  For prescription refill requests, have your pharmacy contact our office with your prescription refill request.

## 2012-06-20 ENCOUNTER — Encounter (HOSPITAL_COMMUNITY): Payer: Managed Care, Other (non HMO) | Attending: Oncology

## 2012-06-20 ENCOUNTER — Other Ambulatory Visit (HOSPITAL_COMMUNITY): Payer: Self-pay | Admitting: Oncology

## 2012-06-20 DIAGNOSIS — C20 Malignant neoplasm of rectum: Secondary | ICD-10-CM | POA: Insufficient documentation

## 2012-06-20 LAB — COMPREHENSIVE METABOLIC PANEL
ALT: 23 U/L (ref 0–53)
AST: 18 U/L (ref 0–37)
Alkaline Phosphatase: 136 U/L — ABNORMAL HIGH (ref 39–117)
CO2: 25 mEq/L (ref 19–32)
Chloride: 102 mEq/L (ref 96–112)
Creatinine, Ser: 1.92 mg/dL — ABNORMAL HIGH (ref 0.50–1.35)
GFR calc non Af Amer: 38 mL/min — ABNORMAL LOW (ref >90)
Potassium: 4.1 mEq/L (ref 3.5–5.1)
Sodium: 138 mEq/L (ref 135–145)
Total Bilirubin: 0.3 mg/dL (ref 0.3–1.2)

## 2012-06-20 LAB — CBC WITH DIFFERENTIAL/PLATELET
Basophils Absolute: 0.1 10*3/uL (ref 0.0–0.1)
HCT: 41.9 % (ref 39.0–52.0)
Hemoglobin: 14.3 g/dL (ref 13.0–17.0)
Lymphocytes Relative: 25 % (ref 12–46)
Monocytes Absolute: 0.5 10*3/uL (ref 0.1–1.0)
Neutro Abs: 4.4 10*3/uL (ref 1.7–7.7)
Neutrophils Relative %: 62 % (ref 43–77)
RDW: 17.3 % — ABNORMAL HIGH (ref 11.5–15.5)
WBC: 7.1 10*3/uL (ref 4.0–10.5)

## 2012-06-20 LAB — MAGNESIUM: Magnesium: 1.2 mg/dL — ABNORMAL LOW (ref 1.5–2.5)

## 2012-06-20 NOTE — Progress Notes (Signed)
Labs drawn today for cea,mg,cbc/diff,cmp

## 2012-06-21 ENCOUNTER — Encounter (HOSPITAL_BASED_OUTPATIENT_CLINIC_OR_DEPARTMENT_OTHER): Payer: Managed Care, Other (non HMO)

## 2012-06-21 VITALS — BP 139/84 | HR 63 | Temp 97.0°F | Resp 18 | Wt 215.0 lb

## 2012-06-21 DIAGNOSIS — C20 Malignant neoplasm of rectum: Secondary | ICD-10-CM

## 2012-06-21 DIAGNOSIS — C7800 Secondary malignant neoplasm of unspecified lung: Secondary | ICD-10-CM

## 2012-06-21 DIAGNOSIS — Z5112 Encounter for antineoplastic immunotherapy: Secondary | ICD-10-CM

## 2012-06-21 DIAGNOSIS — Z5111 Encounter for antineoplastic chemotherapy: Secondary | ICD-10-CM

## 2012-06-21 MED ORDER — SODIUM CHLORIDE 0.9 % IV SOLN
2400.0000 mg/m2 | INTRAVENOUS | Status: DC
  Administered 2012-06-21: 5300 mg via INTRAVENOUS
  Filled 2012-06-21 (×2): qty 106

## 2012-06-21 MED ORDER — SODIUM CHLORIDE 0.9 % IV SOLN
Freq: Once | INTRAVENOUS | Status: AC
  Administered 2012-06-21: 8 mg via INTRAVENOUS
  Filled 2012-06-21: qty 4

## 2012-06-21 MED ORDER — LEUCOVORIN CALCIUM INJECTION 100 MG
20.0000 mg/m2 | Freq: Once | INTRAMUSCULAR | Status: AC
  Administered 2012-06-21: 44 mg via INTRAVENOUS
  Filled 2012-06-21: qty 2.2

## 2012-06-21 MED ORDER — LORAZEPAM 2 MG/ML IJ SOLN
INTRAMUSCULAR | Status: AC
  Filled 2012-06-21: qty 1

## 2012-06-21 MED ORDER — LORAZEPAM 2 MG/ML IJ SOLN
0.5000 mg | Freq: Once | INTRAMUSCULAR | Status: AC
  Administered 2012-06-21: 0.5 mg via INTRAVENOUS

## 2012-06-21 MED ORDER — SODIUM CHLORIDE 0.9 % IV SOLN
Freq: Once | INTRAVENOUS | Status: AC
  Administered 2012-06-21: 10:00:00 via INTRAVENOUS

## 2012-06-21 MED ORDER — MAGNESIUM SULFATE 40 MG/ML IJ SOLN
4.0000 g | Freq: Once | INTRAMUSCULAR | Status: AC
  Administered 2012-06-21: 4 g via INTRAVENOUS
  Filled 2012-06-21: qty 100

## 2012-06-21 MED ORDER — CETUXIMAB CHEMO IV INJECTION 200 MG/100ML
250.0000 mg/m2 | Freq: Once | INTRAVENOUS | Status: AC
  Administered 2012-06-21: 600 mg via INTRAVENOUS
  Filled 2012-06-21: qty 300

## 2012-06-21 MED ORDER — DIPHENHYDRAMINE HCL 50 MG/ML IJ SOLN
INTRAMUSCULAR | Status: AC
  Filled 2012-06-21: qty 1

## 2012-06-21 MED ORDER — FLUOROURACIL CHEMO INJECTION 2.5 GM/50ML
400.0000 mg/m2 | Freq: Once | INTRAVENOUS | Status: AC
  Administered 2012-06-21: 900 mg via INTRAVENOUS
  Filled 2012-06-21: qty 18

## 2012-06-21 MED ORDER — DIPHENHYDRAMINE HCL 50 MG/ML IJ SOLN
50.0000 mg | Freq: Once | INTRAMUSCULAR | Status: AC
  Administered 2012-06-21: 50 mg via INTRAVENOUS

## 2012-06-23 ENCOUNTER — Encounter (HOSPITAL_BASED_OUTPATIENT_CLINIC_OR_DEPARTMENT_OTHER): Payer: Managed Care, Other (non HMO)

## 2012-06-23 DIAGNOSIS — C20 Malignant neoplasm of rectum: Secondary | ICD-10-CM

## 2012-06-23 MED ORDER — HEPARIN SOD (PORK) LOCK FLUSH 100 UNIT/ML IV SOLN
INTRAVENOUS | Status: AC
  Filled 2012-06-23: qty 5

## 2012-06-23 MED ORDER — HEPARIN SOD (PORK) LOCK FLUSH 100 UNIT/ML IV SOLN
500.0000 [IU] | Freq: Once | INTRAVENOUS | Status: AC | PRN
  Administered 2012-06-23: 500 [IU]
  Filled 2012-06-23: qty 5

## 2012-06-23 MED ORDER — SODIUM CHLORIDE 0.9 % IJ SOLN
10.0000 mL | INTRAMUSCULAR | Status: DC | PRN
  Administered 2012-06-23: 10 mL
  Filled 2012-06-23: qty 10

## 2012-06-23 NOTE — Progress Notes (Signed)
D/C continuous infusion pump. Flushed port right chest per protocol. Tolerated well.

## 2012-06-26 ENCOUNTER — Other Ambulatory Visit (HOSPITAL_COMMUNITY): Payer: Self-pay | Admitting: Oncology

## 2012-06-26 ENCOUNTER — Encounter (HOSPITAL_BASED_OUTPATIENT_CLINIC_OR_DEPARTMENT_OTHER): Payer: Managed Care, Other (non HMO)

## 2012-06-26 DIAGNOSIS — C7800 Secondary malignant neoplasm of unspecified lung: Secondary | ICD-10-CM

## 2012-06-26 DIAGNOSIS — C20 Malignant neoplasm of rectum: Secondary | ICD-10-CM

## 2012-06-26 LAB — CBC WITH DIFFERENTIAL/PLATELET
Basophils Absolute: 0 10*3/uL (ref 0.0–0.1)
Basophils Relative: 1 % (ref 0–1)
HCT: 39 % (ref 39.0–52.0)
Lymphocytes Relative: 22 % (ref 12–46)
Monocytes Absolute: 0.3 10*3/uL (ref 0.1–1.0)
Neutro Abs: 3.6 10*3/uL (ref 1.7–7.7)
Neutrophils Relative %: 68 % (ref 43–77)
RDW: 17 % — ABNORMAL HIGH (ref 11.5–15.5)
WBC: 5.3 10*3/uL (ref 4.0–10.5)

## 2012-06-26 LAB — MAGNESIUM: Magnesium: 0.9 mg/dL — CL (ref 1.5–2.5)

## 2012-06-26 NOTE — Progress Notes (Signed)
CRITICAL VALUE ALERT Critical value received:  Magnesium 0.9 Date of notification:  06/26/12 Time of notification: 1046am Critical value read back:  yes Nurse who received alert:  T.Kymberley Raz,RN  MD notified (1st page):  [phone]

## 2012-06-26 NOTE — Progress Notes (Signed)
Labs drawn today for cbc/diff,mg

## 2012-06-27 ENCOUNTER — Encounter (HOSPITAL_BASED_OUTPATIENT_CLINIC_OR_DEPARTMENT_OTHER): Payer: Managed Care, Other (non HMO)

## 2012-06-27 VITALS — BP 183/91 | HR 64 | Temp 97.7°F | Resp 16

## 2012-06-27 DIAGNOSIS — Z5112 Encounter for antineoplastic immunotherapy: Secondary | ICD-10-CM

## 2012-06-27 DIAGNOSIS — C20 Malignant neoplasm of rectum: Secondary | ICD-10-CM

## 2012-06-27 MED ORDER — LORAZEPAM 2 MG/ML IJ SOLN
0.5000 mg | Freq: Once | INTRAMUSCULAR | Status: AC
  Administered 2012-06-27: 0.5 mg via INTRAVENOUS

## 2012-06-27 MED ORDER — MAGNESIUM SULFATE 40 MG/ML IJ SOLN
4.0000 g | Freq: Once | INTRAMUSCULAR | Status: AC
  Administered 2012-06-27: 4 g via INTRAVENOUS
  Filled 2012-06-27: qty 100

## 2012-06-27 MED ORDER — SODIUM CHLORIDE 0.9 % IJ SOLN
10.0000 mL | INTRAMUSCULAR | Status: DC | PRN
  Filled 2012-06-27: qty 10

## 2012-06-27 MED ORDER — DIPHENHYDRAMINE HCL 50 MG/ML IJ SOLN
50.0000 mg | Freq: Once | INTRAMUSCULAR | Status: AC
  Administered 2012-06-27: 50 mg via INTRAVENOUS

## 2012-06-27 MED ORDER — HEPARIN SOD (PORK) LOCK FLUSH 100 UNIT/ML IV SOLN
INTRAVENOUS | Status: AC
  Filled 2012-06-27: qty 5

## 2012-06-27 MED ORDER — HEPARIN SOD (PORK) LOCK FLUSH 100 UNIT/ML IV SOLN
500.0000 [IU] | Freq: Once | INTRAVENOUS | Status: AC | PRN
  Administered 2012-06-27: 500 [IU]
  Filled 2012-06-27: qty 5

## 2012-06-27 MED ORDER — DIPHENHYDRAMINE HCL 50 MG/ML IJ SOLN
INTRAMUSCULAR | Status: AC
  Filled 2012-06-27: qty 1

## 2012-06-27 MED ORDER — CETUXIMAB CHEMO IV INJECTION 200 MG/100ML
250.0000 mg/m2 | Freq: Once | INTRAVENOUS | Status: AC
  Administered 2012-06-27: 600 mg via INTRAVENOUS
  Filled 2012-06-27: qty 300

## 2012-06-27 MED ORDER — LORAZEPAM 2 MG/ML IJ SOLN
INTRAMUSCULAR | Status: AC
  Filled 2012-06-27: qty 1

## 2012-06-27 MED ORDER — SODIUM CHLORIDE 0.9 % IV SOLN
Freq: Once | INTRAVENOUS | Status: AC
  Administered 2012-06-27: 10:00:00 via INTRAVENOUS

## 2012-07-03 ENCOUNTER — Other Ambulatory Visit (HOSPITAL_COMMUNITY): Payer: Self-pay | Admitting: Oncology

## 2012-07-03 ENCOUNTER — Encounter (HOSPITAL_BASED_OUTPATIENT_CLINIC_OR_DEPARTMENT_OTHER): Payer: Managed Care, Other (non HMO)

## 2012-07-03 DIAGNOSIS — C20 Malignant neoplasm of rectum: Secondary | ICD-10-CM

## 2012-07-03 DIAGNOSIS — N289 Disorder of kidney and ureter, unspecified: Secondary | ICD-10-CM

## 2012-07-03 LAB — CBC WITH DIFFERENTIAL/PLATELET
Basophils Absolute: 0.1 10*3/uL (ref 0.0–0.1)
Basophils Relative: 1 % (ref 0–1)
Eosinophils Absolute: 0.4 10*3/uL (ref 0.0–0.7)
MCH: 32.1 pg (ref 26.0–34.0)
MCHC: 33.6 g/dL (ref 30.0–36.0)
Monocytes Relative: 10 % (ref 3–12)
Neutro Abs: 4.8 10*3/uL (ref 1.7–7.7)
Neutrophils Relative %: 63 % (ref 43–77)
RDW: 17.7 % — ABNORMAL HIGH (ref 11.5–15.5)

## 2012-07-03 LAB — MAGNESIUM: Magnesium: 1.1 mg/dL — ABNORMAL LOW (ref 1.5–2.5)

## 2012-07-03 LAB — COMPREHENSIVE METABOLIC PANEL
AST: 22 U/L (ref 0–37)
Albumin: 4 g/dL (ref 3.5–5.2)
Alkaline Phosphatase: 128 U/L — ABNORMAL HIGH (ref 39–117)
BUN: 27 mg/dL — ABNORMAL HIGH (ref 6–23)
Chloride: 107 mEq/L (ref 96–112)
Creatinine, Ser: 2.06 mg/dL — ABNORMAL HIGH (ref 0.50–1.35)
Potassium: 4.5 mEq/L (ref 3.5–5.1)
Total Bilirubin: 0.3 mg/dL (ref 0.3–1.2)
Total Protein: 6.8 g/dL (ref 6.0–8.3)

## 2012-07-03 NOTE — Progress Notes (Signed)
Labs drawn today for cbc/diff,cmp,mg 

## 2012-07-04 ENCOUNTER — Ambulatory Visit (HOSPITAL_COMMUNITY)
Admission: RE | Admit: 2012-07-04 | Discharge: 2012-07-04 | Disposition: A | Discharge status: Home / Self Care | Payer: Managed Care, Other (non HMO) | Source: Ambulatory Visit | Attending: Oncology | Admitting: Oncology

## 2012-07-04 ENCOUNTER — Encounter (HOSPITAL_BASED_OUTPATIENT_CLINIC_OR_DEPARTMENT_OTHER): Payer: Managed Care, Other (non HMO)

## 2012-07-04 ENCOUNTER — Other Ambulatory Visit (HOSPITAL_COMMUNITY): Payer: Self-pay | Admitting: Oncology

## 2012-07-04 VITALS — BP 160/94 | HR 65 | Temp 97.8°F | Resp 18 | Wt 213.0 lb

## 2012-07-04 DIAGNOSIS — N289 Disorder of kidney and ureter, unspecified: Secondary | ICD-10-CM | POA: Insufficient documentation

## 2012-07-04 DIAGNOSIS — Z5111 Encounter for antineoplastic chemotherapy: Secondary | ICD-10-CM

## 2012-07-04 DIAGNOSIS — Z5112 Encounter for antineoplastic immunotherapy: Secondary | ICD-10-CM

## 2012-07-04 DIAGNOSIS — C7800 Secondary malignant neoplasm of unspecified lung: Secondary | ICD-10-CM

## 2012-07-04 DIAGNOSIS — C20 Malignant neoplasm of rectum: Secondary | ICD-10-CM

## 2012-07-04 MED ORDER — SODIUM CHLORIDE 0.9 % IV SOLN
2400.0000 mg/m2 | INTRAVENOUS | Status: DC
  Administered 2012-07-04: 5300 mg via INTRAVENOUS
  Filled 2012-07-04 (×2): qty 106

## 2012-07-04 MED ORDER — FLUOROURACIL CHEMO INJECTION 2.5 GM/50ML
400.0000 mg/m2 | Freq: Once | INTRAVENOUS | Status: AC
  Administered 2012-07-04: 900 mg via INTRAVENOUS
  Filled 2012-07-04: qty 18

## 2012-07-04 MED ORDER — LEUCOVORIN CALCIUM INJECTION 100 MG
20.0000 mg/m2 | Freq: Once | INTRAMUSCULAR | Status: AC
  Administered 2012-07-04: 44 mg via INTRAVENOUS
  Filled 2012-07-04: qty 2.2

## 2012-07-04 MED ORDER — MAGNESIUM SULFATE 40 MG/ML IJ SOLN
4.0000 g | Freq: Once | INTRAMUSCULAR | Status: AC
  Administered 2012-07-04: 4 g via INTRAVENOUS
  Filled 2012-07-04: qty 100

## 2012-07-04 MED ORDER — CETUXIMAB CHEMO IV INJECTION 200 MG/100ML
250.0000 mg/m2 | Freq: Once | INTRAVENOUS | Status: AC
  Administered 2012-07-04: 600 mg via INTRAVENOUS
  Filled 2012-07-04: qty 300

## 2012-07-04 MED ORDER — DIPHENHYDRAMINE HCL 50 MG/ML IJ SOLN
50.0000 mg | Freq: Once | INTRAMUSCULAR | Status: AC
  Administered 2012-07-04: 50 mg via INTRAVENOUS

## 2012-07-04 MED ORDER — LORAZEPAM 2 MG/ML IJ SOLN
INTRAMUSCULAR | Status: AC
  Filled 2012-07-04: qty 1

## 2012-07-04 MED ORDER — SODIUM CHLORIDE 0.9 % IV SOLN
Freq: Once | INTRAVENOUS | Status: AC
  Administered 2012-07-04: 09:00:00 via INTRAVENOUS

## 2012-07-04 MED ORDER — DIPHENHYDRAMINE HCL 50 MG/ML IJ SOLN
INTRAMUSCULAR | Status: AC
  Filled 2012-07-04: qty 1

## 2012-07-04 MED ORDER — LORAZEPAM 2 MG/ML IJ SOLN
0.5000 mg | Freq: Once | INTRAMUSCULAR | Status: AC
  Administered 2012-07-04: 0.5 mg via INTRAVENOUS

## 2012-07-04 MED ORDER — SODIUM CHLORIDE 0.9 % IV SOLN
Freq: Once | INTRAVENOUS | Status: AC
  Administered 2012-07-04: 8 mg via INTRAVENOUS
  Filled 2012-07-04: qty 4

## 2012-07-05 ENCOUNTER — Inpatient Hospital Stay (HOSPITAL_COMMUNITY): Payer: Managed Care, Other (non HMO)

## 2012-07-06 ENCOUNTER — Encounter (HOSPITAL_BASED_OUTPATIENT_CLINIC_OR_DEPARTMENT_OTHER): Payer: Managed Care, Other (non HMO)

## 2012-07-06 DIAGNOSIS — C7800 Secondary malignant neoplasm of unspecified lung: Secondary | ICD-10-CM

## 2012-07-06 DIAGNOSIS — C20 Malignant neoplasm of rectum: Secondary | ICD-10-CM

## 2012-07-06 DIAGNOSIS — Z452 Encounter for adjustment and management of vascular access device: Secondary | ICD-10-CM

## 2012-07-06 MED ORDER — HEPARIN SOD (PORK) LOCK FLUSH 100 UNIT/ML IV SOLN
INTRAVENOUS | Status: AC
  Filled 2012-07-06: qty 5

## 2012-07-06 MED ORDER — HEPARIN SOD (PORK) LOCK FLUSH 100 UNIT/ML IV SOLN
500.0000 [IU] | Freq: Once | INTRAVENOUS | Status: AC
  Administered 2012-07-06: 500 [IU] via INTRAVENOUS
  Filled 2012-07-06: qty 5

## 2012-07-06 MED ORDER — SODIUM CHLORIDE 0.9 % IJ SOLN
10.0000 mL | INTRAMUSCULAR | Status: DC | PRN
  Administered 2012-07-06: 10 mL via INTRAVENOUS
  Filled 2012-07-06: qty 10

## 2012-07-06 NOTE — Progress Notes (Signed)
In for continuous pump infusion d/c.Port flushed per clinic protocol.

## 2012-07-10 ENCOUNTER — Other Ambulatory Visit (HOSPITAL_COMMUNITY): Payer: Self-pay | Admitting: Oncology

## 2012-07-10 ENCOUNTER — Encounter (HOSPITAL_BASED_OUTPATIENT_CLINIC_OR_DEPARTMENT_OTHER): Payer: Managed Care, Other (non HMO)

## 2012-07-10 ENCOUNTER — Encounter (HOSPITAL_COMMUNITY): Payer: Self-pay | Admitting: Oncology

## 2012-07-10 DIAGNOSIS — C20 Malignant neoplasm of rectum: Secondary | ICD-10-CM

## 2012-07-10 LAB — COMPREHENSIVE METABOLIC PANEL
BUN: 29 mg/dL — ABNORMAL HIGH (ref 6–23)
CO2: 24 mEq/L (ref 19–32)
Calcium: 7.7 mg/dL — ABNORMAL LOW (ref 8.4–10.5)
Chloride: 103 mEq/L (ref 96–112)
Creatinine, Ser: 1.59 mg/dL — ABNORMAL HIGH (ref 0.50–1.35)
GFR calc Af Amer: 55 mL/min — ABNORMAL LOW (ref >90)
GFR calc non Af Amer: 47 mL/min — ABNORMAL LOW (ref >90)
Glucose, Bld: 103 mg/dL — ABNORMAL HIGH (ref 70–99)
Total Bilirubin: 0.4 mg/dL (ref 0.3–1.2)

## 2012-07-10 LAB — DIFFERENTIAL
Basophils Absolute: 0 10*3/uL (ref 0.0–0.1)
Basophils Relative: 0 % (ref 0–1)
Lymphocytes Relative: 34 % (ref 12–46)
Neutro Abs: 3.7 10*3/uL (ref 1.7–7.7)

## 2012-07-10 LAB — CBC
HCT: 38.2 % — ABNORMAL LOW (ref 39.0–52.0)
Hemoglobin: 13.2 g/dL (ref 13.0–17.0)
MCH: 32.7 pg (ref 26.0–34.0)
MCV: 94.6 fL (ref 78.0–100.0)
RBC: 4.04 MIL/uL — ABNORMAL LOW (ref 4.22–5.81)

## 2012-07-10 MED ORDER — MAGNESIUM OXIDE 400 (241.3 MG) MG PO TABS: ORAL_TABLET | ORAL | Status: DC

## 2012-07-10 NOTE — Progress Notes (Signed)
Labs drawn today for cbc/diff,cmp,mg 

## 2012-07-11 ENCOUNTER — Encounter (HOSPITAL_BASED_OUTPATIENT_CLINIC_OR_DEPARTMENT_OTHER): Payer: Managed Care, Other (non HMO)

## 2012-07-11 DIAGNOSIS — C20 Malignant neoplasm of rectum: Secondary | ICD-10-CM

## 2012-07-11 DIAGNOSIS — C7800 Secondary malignant neoplasm of unspecified lung: Secondary | ICD-10-CM

## 2012-07-11 DIAGNOSIS — Z5112 Encounter for antineoplastic immunotherapy: Secondary | ICD-10-CM

## 2012-07-11 MED ORDER — SODIUM CHLORIDE 0.9 % IV SOLN
Freq: Once | INTRAVENOUS | Status: AC
  Administered 2012-07-11: 09:00:00 via INTRAVENOUS

## 2012-07-11 MED ORDER — LORAZEPAM 2 MG/ML IJ SOLN
INTRAMUSCULAR | Status: AC
  Filled 2012-07-11: qty 1

## 2012-07-11 MED ORDER — HEPARIN SOD (PORK) LOCK FLUSH 100 UNIT/ML IV SOLN
INTRAVENOUS | Status: AC
  Filled 2012-07-11: qty 5

## 2012-07-11 MED ORDER — CETUXIMAB CHEMO IV INJECTION 200 MG/100ML
250.0000 mg/m2 | Freq: Once | INTRAVENOUS | Status: AC
  Administered 2012-07-11: 600 mg via INTRAVENOUS
  Filled 2012-07-11: qty 300

## 2012-07-11 MED ORDER — DIPHENHYDRAMINE HCL 50 MG/ML IJ SOLN
50.0000 mg | Freq: Once | INTRAMUSCULAR | Status: AC
  Administered 2012-07-11: 50 mg via INTRAVENOUS

## 2012-07-11 MED ORDER — SODIUM CHLORIDE 0.9 % IJ SOLN
10.0000 mL | INTRAMUSCULAR | Status: DC | PRN
  Filled 2012-07-11: qty 10

## 2012-07-11 MED ORDER — HEPARIN SOD (PORK) LOCK FLUSH 100 UNIT/ML IV SOLN
500.0000 [IU] | Freq: Once | INTRAVENOUS | Status: AC | PRN
  Administered 2012-07-11: 500 [IU]
  Filled 2012-07-11: qty 5

## 2012-07-11 MED ORDER — DIPHENHYDRAMINE HCL 50 MG/ML IJ SOLN
INTRAMUSCULAR | Status: AC
  Filled 2012-07-11: qty 1

## 2012-07-11 MED ORDER — LORAZEPAM 2 MG/ML IJ SOLN
0.5000 mg | Freq: Once | INTRAMUSCULAR | Status: AC
  Administered 2012-07-11: 0.5 mg via INTRAVENOUS

## 2012-07-11 MED ORDER — MAGNESIUM SULFATE 40 MG/ML IJ SOLN
4.0000 g | Freq: Once | INTRAMUSCULAR | Status: AC
  Administered 2012-07-11: 4 g via INTRAVENOUS
  Filled 2012-07-11: qty 100

## 2012-07-11 NOTE — Progress Notes (Signed)
Tolerated well. Patient is aware of megnesium prescription.

## 2012-07-14 ENCOUNTER — Ambulatory Visit (HOSPITAL_COMMUNITY)
Admission: RE | Admit: 2012-07-14 | Discharge: 2012-07-14 | Disposition: A | Discharge status: Home / Self Care | Payer: Managed Care, Other (non HMO) | Source: Ambulatory Visit | Attending: Oncology | Admitting: Oncology

## 2012-07-14 ENCOUNTER — Ambulatory Visit (HOSPITAL_COMMUNITY): Admission: RE | Admit: 2012-07-14 | Payer: Managed Care, Other (non HMO) | Source: Ambulatory Visit

## 2012-07-14 DIAGNOSIS — C7800 Secondary malignant neoplasm of unspecified lung: Secondary | ICD-10-CM | POA: Insufficient documentation

## 2012-07-14 DIAGNOSIS — C20 Malignant neoplasm of rectum: Secondary | ICD-10-CM

## 2012-07-14 MED ORDER — IOHEXOL 300 MG/ML  SOLN
100.0000 mL | Freq: Once | INTRAMUSCULAR | Status: AC | PRN
  Administered 2012-07-14: 100 mL via INTRAVENOUS

## 2012-07-18 ENCOUNTER — Telehealth (HOSPITAL_COMMUNITY): Payer: Self-pay

## 2012-07-18 ENCOUNTER — Encounter (HOSPITAL_BASED_OUTPATIENT_CLINIC_OR_DEPARTMENT_OTHER): Payer: Managed Care, Other (non HMO) | Admitting: Oncology

## 2012-07-18 VITALS — BP 188/97 | HR 69 | Temp 97.9°F | Resp 18 | Wt 210.5 lb

## 2012-07-18 DIAGNOSIS — C7800 Secondary malignant neoplasm of unspecified lung: Secondary | ICD-10-CM

## 2012-07-18 DIAGNOSIS — C20 Malignant neoplasm of rectum: Secondary | ICD-10-CM

## 2012-07-18 DIAGNOSIS — R21 Rash and other nonspecific skin eruption: Secondary | ICD-10-CM

## 2012-07-18 LAB — BASIC METABOLIC PANEL
BUN: 17 mg/dL (ref 6–23)
Chloride: 102 mEq/L (ref 96–112)
GFR calc non Af Amer: 52 mL/min — ABNORMAL LOW (ref >90)
Glucose, Bld: 97 mg/dL (ref 70–99)
Potassium: 4.1 mEq/L (ref 3.5–5.1)
Sodium: 140 mEq/L (ref 135–145)

## 2012-07-18 NOTE — Patient Instructions (Addendum)
[facility] Discharge Instructions  RECOMMENDATIONS MADE BY THE CONSULTANT AND ANY TEST RESULTS WILL BE SENT TO YOUR REFERRING PHYSICIAN.  EXAM FINDINGS BY THE PHYSICIAN TODAY AND SIGNS OR SYMPTOMS TO REPORT TO CLINIC OR PRIMARY PHYSICIAN: exam and discussion by MD.  Your scans have improved and you are doing well.  Will do  2 more treatments then give you a break.  MEDICATIONS PRESCRIBED:  None  INSTRUCTIONS GIVEN AND DISCUSSED: Report any fevers, uncontrolled nausea or vomiting or pain.  SPECIAL INSTRUCTIONS/FOLLOW-UP: Chemo as scheduled and to see PA in 1 month.  Thank you for choosing [facility] to provide your oncology and hematology care.  To afford each patient quality time with our providers, please arrive at least 15 minutes before your scheduled appointment time.  With your help, our goal is to use those 15 minutes to complete the necessary work-up to ensure our physicians have the information they need to help with your evaluation and healthcare recommendations.    Effective January 1st, 2014, we ask that you re-schedule your appointment with our physicians should you arrive 10 or more minutes late for your appointment.  We strive to give you quality time with our providers, and arriving late affects you and other patients whose appointments are after yours.    Again, thank you for choosing [facility].  Our hope is that these requests will decrease the amount of time that you wait before being seen by our physicians.       _____________________________________________________________  Should you have questions after your visit to [facility], please contact our office at [phone] between the hours of 8:30 a.m. and 5:00 p.m.  Voicemails left after 4:30 p.m. will not be returned until the following business day.  For prescription refill requests, have your pharmacy contact our office with your prescription refill  request.

## 2012-07-18 NOTE — Progress Notes (Signed)
This gentleman has a history of rectal adenocarcinoma presenting in February 2008 with one of 12 positive lymph nodes but also with LV I. There was some question of persistence of disease at the margin and so he received 6 cycles of adjuvant chemotherapy with oxaliplatinum, capecitabine, and Avastin. He also received postoperative radiation with concomitant capecitabine. He then had recurrent disease suspected in February 2011 improve and eventually by lung biopsy. He had bilateral lung involvement. His disease recurrence has been limited to for a special the left as well both lung areas. He has not had disease recur below the diaphragm that we have ever found.  He is presently on therapy consisting of cetuximab 5-FU and leucovorin. Initially when he recurred in 2011 he was treated with oxaliplatinum 5-FU leucovorin and Avastin. After a break in therapy he was read treated this time with CPT-11, 5-FU, leucovorin, and Avastin. Prior to this in 2013 he was treated with Avastin, 5-FU and CPT-11.  We have try to give him a break from the CPT-11 and the oxaliplatinum. He has been K-ras wild-type. He has had significant peripheral neuropathy issues with the oxaliplatinum.  His CAT scan you the day shows nice improvement once again with disappearance of the left pleural effusion and improvement in most of the pulmonary nodules. The do not appear to be any new nodules and again there is no disease below the diaphragm.  He does have the rash over his back chest arms et Karie Soda from the cetuximab but it does not itch or burning but does scale and slough. He has no palpable lymphadenopathy. He is in no acute distress. Vital signs are very stable. His colostomy is working well. He has no obvious hepatosplenomegaly. Port-A-Cath is intact. Heart shows a regular rhythm and rate without murmur rub or gallop. No leg edema.  He would like to have a break in therapy after 2 more doses which I think is reasonable. We will then  have to rescan him in August.  I suspect the next time we will have to treat him either with oxaliplatinum-based therapy or we could try cetuximab or the [employer]-based K-ras drug. We will make that decision at that time.

## 2012-07-18 NOTE — Telephone Encounter (Signed)
Message left for patient to continue Magnesium 6x daily and that we will be giving him IV magnesium with each remaining treatment.

## 2012-07-20 MED ORDER — HEPARIN SOD (PORK) LOCK FLUSH 100 UNIT/ML IV SOLN
INTRAVENOUS | Status: AC
  Filled 2012-07-20: qty 5

## 2012-07-24 ENCOUNTER — Encounter (HOSPITAL_COMMUNITY): Payer: Managed Care, Other (non HMO) | Attending: Oncology

## 2012-07-24 DIAGNOSIS — C7800 Secondary malignant neoplasm of unspecified lung: Secondary | ICD-10-CM

## 2012-07-24 DIAGNOSIS — Z9889 Other specified postprocedural states: Secondary | ICD-10-CM | POA: Insufficient documentation

## 2012-07-24 DIAGNOSIS — C20 Malignant neoplasm of rectum: Secondary | ICD-10-CM | POA: Insufficient documentation

## 2012-07-24 LAB — CBC WITH DIFFERENTIAL/PLATELET
Basophils Relative: 1 % (ref 0–1)
Eosinophils Absolute: 0.3 10*3/uL (ref 0.0–0.7)
HCT: 40.2 % (ref 39.0–52.0)
Hemoglobin: 13.7 g/dL (ref 13.0–17.0)
Lymphs Abs: 1.7 10*3/uL (ref 0.7–4.0)
MCH: 32.9 pg (ref 26.0–34.0)
MCHC: 34.1 g/dL (ref 30.0–36.0)
Monocytes Absolute: 0.4 10*3/uL (ref 0.1–1.0)
Monocytes Relative: 6 % (ref 3–12)
Neutro Abs: 3.8 10*3/uL (ref 1.7–7.7)
Neutrophils Relative %: 61 % (ref 43–77)
RBC: 4.16 MIL/uL — ABNORMAL LOW (ref 4.22–5.81)

## 2012-07-24 LAB — COMPREHENSIVE METABOLIC PANEL
ALT: 25 U/L (ref 0–53)
Alkaline Phosphatase: 138 U/L — ABNORMAL HIGH (ref 39–117)
BUN: 25 mg/dL — ABNORMAL HIGH (ref 6–23)
CO2: 26 mEq/L (ref 19–32)
GFR calc Af Amer: 52 mL/min — ABNORMAL LOW (ref >90)
GFR calc non Af Amer: 45 mL/min — ABNORMAL LOW (ref >90)
Glucose, Bld: 108 mg/dL — ABNORMAL HIGH (ref 70–99)
Potassium: 4 mEq/L (ref 3.5–5.1)
Sodium: 138 mEq/L (ref 135–145)
Total Bilirubin: 0.2 mg/dL — ABNORMAL LOW (ref 0.3–1.2)

## 2012-07-24 LAB — CEA: CEA: 12.6 ng/mL — ABNORMAL HIGH (ref 0.0–5.0)

## 2012-07-24 NOTE — Progress Notes (Signed)
Labs drawn today for cbc/diff,mg,cea,cmp

## 2012-07-25 ENCOUNTER — Encounter (HOSPITAL_BASED_OUTPATIENT_CLINIC_OR_DEPARTMENT_OTHER): Payer: Managed Care, Other (non HMO)

## 2012-07-25 VITALS — BP 161/89 | HR 63 | Temp 98.0°F | Resp 16 | Wt 213.0 lb

## 2012-07-25 DIAGNOSIS — Z5112 Encounter for antineoplastic immunotherapy: Secondary | ICD-10-CM

## 2012-07-25 DIAGNOSIS — Z5111 Encounter for antineoplastic chemotherapy: Secondary | ICD-10-CM

## 2012-07-25 DIAGNOSIS — C7800 Secondary malignant neoplasm of unspecified lung: Secondary | ICD-10-CM

## 2012-07-25 DIAGNOSIS — C20 Malignant neoplasm of rectum: Secondary | ICD-10-CM

## 2012-07-25 MED ORDER — LEUCOVORIN CALCIUM INJECTION 100 MG
20.0000 mg/m2 | Freq: Once | INTRAMUSCULAR | Status: AC
  Administered 2012-07-25: 44 mg via INTRAVENOUS
  Filled 2012-07-25: qty 2.2

## 2012-07-25 MED ORDER — CETUXIMAB CHEMO IV INJECTION 200 MG/100ML
250.0000 mg/m2 | Freq: Once | INTRAVENOUS | Status: AC
  Administered 2012-07-25: 600 mg via INTRAVENOUS
  Filled 2012-07-25: qty 300

## 2012-07-25 MED ORDER — SODIUM CHLORIDE 0.9 % IV SOLN
Freq: Once | INTRAVENOUS | Status: AC
  Administered 2012-07-25: 8 mg via INTRAVENOUS
  Filled 2012-07-25: qty 4

## 2012-07-25 MED ORDER — SODIUM CHLORIDE 0.9 % IV SOLN
Freq: Once | INTRAVENOUS | Status: AC
  Administered 2012-07-25: 10:00:00 via INTRAVENOUS

## 2012-07-25 MED ORDER — LORAZEPAM 2 MG/ML IJ SOLN
INTRAMUSCULAR | Status: AC
  Filled 2012-07-25: qty 1

## 2012-07-25 MED ORDER — SODIUM CHLORIDE 0.9 % IJ SOLN
10.0000 mL | INTRAMUSCULAR | Status: DC | PRN
  Administered 2012-07-25: 10 mL
  Filled 2012-07-25: qty 10

## 2012-07-25 MED ORDER — LORAZEPAM 2 MG/ML IJ SOLN
0.5000 mg | Freq: Once | INTRAMUSCULAR | Status: AC
  Administered 2012-07-25: 0.5 mg via INTRAVENOUS

## 2012-07-25 MED ORDER — FLUOROURACIL CHEMO INJECTION 2.5 GM/50ML
400.0000 mg/m2 | Freq: Once | INTRAVENOUS | Status: AC
  Administered 2012-07-25: 900 mg via INTRAVENOUS
  Filled 2012-07-25: qty 18

## 2012-07-25 MED ORDER — DIPHENHYDRAMINE HCL 50 MG/ML IJ SOLN
INTRAMUSCULAR | Status: AC
  Filled 2012-07-25: qty 1

## 2012-07-25 MED ORDER — DIPHENHYDRAMINE HCL 50 MG/ML IJ SOLN
50.0000 mg | Freq: Once | INTRAMUSCULAR | Status: AC
  Administered 2012-07-25: 50 mg via INTRAVENOUS

## 2012-07-25 MED ORDER — SODIUM CHLORIDE 0.9 % IV SOLN
2400.0000 mg/m2 | INTRAVENOUS | Status: DC
  Administered 2012-07-25: 5300 mg via INTRAVENOUS
  Filled 2012-07-25 (×2): qty 106

## 2012-07-25 NOTE — Progress Notes (Signed)
Tolerated chemo well. 

## 2012-07-26 ENCOUNTER — Encounter (HOSPITAL_COMMUNITY): Payer: Managed Care, Other (non HMO)

## 2012-07-27 ENCOUNTER — Encounter (HOSPITAL_BASED_OUTPATIENT_CLINIC_OR_DEPARTMENT_OTHER): Payer: Managed Care, Other (non HMO)

## 2012-07-27 DIAGNOSIS — Z95828 Presence of other vascular implants and grafts: Secondary | ICD-10-CM

## 2012-07-27 DIAGNOSIS — C7800 Secondary malignant neoplasm of unspecified lung: Secondary | ICD-10-CM

## 2012-07-27 DIAGNOSIS — Z452 Encounter for adjustment and management of vascular access device: Secondary | ICD-10-CM

## 2012-07-27 DIAGNOSIS — C20 Malignant neoplasm of rectum: Secondary | ICD-10-CM

## 2012-07-27 MED ORDER — SODIUM CHLORIDE 0.9 % IJ SOLN
10.0000 mL | INTRAMUSCULAR | Status: DC | PRN
  Administered 2012-07-27: 10 mL via INTRAVENOUS
  Filled 2012-07-27: qty 10

## 2012-07-27 MED ORDER — HEPARIN SOD (PORK) LOCK FLUSH 100 UNIT/ML IV SOLN
500.0000 [IU] | Freq: Once | INTRAVENOUS | Status: AC
  Administered 2012-07-27: 500 [IU] via INTRAVENOUS
  Filled 2012-07-27: qty 5

## 2012-07-27 NOTE — Progress Notes (Signed)
Martin Maldonado presented for Portacath deaccess and flush after continous infusion 5 fu. Proper placement of portacath confirmed by CXR. Portacath located rt chest wall deaccessed and Portacath flushed with 20ml NS and 500U/3ml Heparin and needle removed intact. Procedure without incident. Patient tolerated procedure well.

## 2012-08-07 ENCOUNTER — Encounter (HOSPITAL_BASED_OUTPATIENT_CLINIC_OR_DEPARTMENT_OTHER): Payer: Managed Care, Other (non HMO)

## 2012-08-07 DIAGNOSIS — C20 Malignant neoplasm of rectum: Secondary | ICD-10-CM

## 2012-08-07 LAB — COMPREHENSIVE METABOLIC PANEL
ALT: 19 U/L (ref 0–53)
Albumin: 4 g/dL (ref 3.5–5.2)
Calcium: 9.6 mg/dL (ref 8.4–10.5)
GFR calc Af Amer: 41 mL/min — ABNORMAL LOW (ref >90)
Glucose, Bld: 103 mg/dL — ABNORMAL HIGH (ref 70–99)
Sodium: 137 mEq/L (ref 135–145)
Total Protein: 7.3 g/dL (ref 6.0–8.3)

## 2012-08-07 LAB — MAGNESIUM: Magnesium: 1.5 mg/dL (ref 1.5–2.5)

## 2012-08-07 LAB — CBC WITH DIFFERENTIAL/PLATELET
Basophils Absolute: 0.1 10*3/uL (ref 0.0–0.1)
Basophils Relative: 1 % (ref 0–1)
Eosinophils Absolute: 0.3 10*3/uL (ref 0.0–0.7)
MCH: 32.8 pg (ref 26.0–34.0)
MCHC: 33.5 g/dL (ref 30.0–36.0)
Monocytes Relative: 6 % (ref 3–12)
Neutro Abs: 6.5 10*3/uL (ref 1.7–7.7)
Neutrophils Relative %: 69 % (ref 43–77)
Platelets: 214 10*3/uL (ref 150–400)
RDW: 16.4 % — ABNORMAL HIGH (ref 11.5–15.5)

## 2012-08-07 NOTE — Progress Notes (Signed)
Labs drawn today for cbc/diff,cmp,mg 

## 2012-08-08 ENCOUNTER — Encounter (HOSPITAL_BASED_OUTPATIENT_CLINIC_OR_DEPARTMENT_OTHER): Payer: Managed Care, Other (non HMO)

## 2012-08-08 VITALS — BP 155/88 | HR 60 | Temp 97.4°F | Resp 18 | Wt 208.0 lb

## 2012-08-08 DIAGNOSIS — C20 Malignant neoplasm of rectum: Secondary | ICD-10-CM

## 2012-08-08 DIAGNOSIS — C7800 Secondary malignant neoplasm of unspecified lung: Secondary | ICD-10-CM

## 2012-08-08 DIAGNOSIS — Z5112 Encounter for antineoplastic immunotherapy: Secondary | ICD-10-CM

## 2012-08-08 MED ORDER — CETUXIMAB CHEMO IV INJECTION 200 MG/100ML
250.0000 mg/m2 | Freq: Once | INTRAVENOUS | Status: AC
  Administered 2012-08-08: 600 mg via INTRAVENOUS
  Filled 2012-08-08: qty 300

## 2012-08-08 MED ORDER — DIPHENHYDRAMINE HCL 50 MG/ML IJ SOLN
INTRAMUSCULAR | Status: AC
  Filled 2012-08-08: qty 1

## 2012-08-08 MED ORDER — HEPARIN SOD (PORK) LOCK FLUSH 100 UNIT/ML IV SOLN
500.0000 [IU] | Freq: Once | INTRAVENOUS | Status: AC | PRN
  Administered 2012-08-08: 500 [IU]
  Filled 2012-08-08: qty 5

## 2012-08-08 MED ORDER — LORAZEPAM 2 MG/ML IJ SOLN
INTRAMUSCULAR | Status: AC
  Filled 2012-08-08: qty 1

## 2012-08-08 MED ORDER — SODIUM CHLORIDE 0.9 % IJ SOLN
10.0000 mL | INTRAMUSCULAR | Status: DC | PRN
  Filled 2012-08-08: qty 10

## 2012-08-08 MED ORDER — DIPHENHYDRAMINE HCL 50 MG/ML IJ SOLN
50.0000 mg | Freq: Once | INTRAMUSCULAR | Status: AC
  Administered 2012-08-08: 50 mg via INTRAVENOUS

## 2012-08-08 MED ORDER — HEPARIN SOD (PORK) LOCK FLUSH 100 UNIT/ML IV SOLN
INTRAVENOUS | Status: AC
  Filled 2012-08-08: qty 5

## 2012-08-08 MED ORDER — LORAZEPAM 2 MG/ML IJ SOLN
0.5000 mg | Freq: Once | INTRAMUSCULAR | Status: AC
  Administered 2012-08-08: 0.5 mg via INTRAVENOUS

## 2012-08-08 MED ORDER — SODIUM CHLORIDE 0.9 % IV SOLN
Freq: Once | INTRAVENOUS | Status: AC
  Administered 2012-08-08: 09:00:00 via INTRAVENOUS

## 2012-08-08 NOTE — Progress Notes (Signed)
Patient in today on 5/20 with expectations of today being erbitux only and his last treatment. He was seen on 4/29 and Neijstroms note says 2 more doses then a break. He understood this to mean 1 visit with 5fu lecovorin and erbitux and 1 visit with erbitux only. He was treated on 5/6 with chemo and erbitux. He says he was told he did not have to come the week of the 12th. He returned today and only wants to do the last dose of erbitux. Tolerated infusion well.

## 2012-08-10 ENCOUNTER — Telehealth (HOSPITAL_COMMUNITY): Payer: Self-pay | Admitting: *Deleted

## 2012-08-10 NOTE — Telephone Encounter (Signed)
See notes below

## 2012-08-10 NOTE — Telephone Encounter (Signed)
Message copied by Adelene Amas on Thu Aug 10, 2012  8:11 AM ------      Message from: Ellouise Newer      Created: Tue Aug 08, 2012  9:27 AM       Ok.  If that is what he wants. I suspect Neijstrom meant 2 treatments with 5FU and Leucovorin with erbitux in between.  But if [place] wants to stop, then we can.            ----- Message -----         From: Adelene Amas, RN         Sent: 08/08/2012   9:18 AM           To: Ellouise Newer, PA-C, Randall An, MD            Patient in today on 5/20 with expectations of today being erbitux only and his last treatment. He was seen on 4/29 and Neijstroms note says 2 more doses then a break. He understood this to mean 1 visit with 61fu lecovorin and erbitux and 1 visit with erbitux only. He was treated on 5/6 with chemo and erbitux. He says he was told he did not have to come the week of the 12th. He returned today and only wants to do the last dose of erbitux.       ------

## 2012-08-16 NOTE — Progress Notes (Signed)
[facility], MD [address]  [place] [address]  Rectal cancer - Plan: CT Abdomen Pelvis W Contrast, CT Chest W Contrast, CBC with Differential, Comprehensive metabolic panel, CEA, CANCELED: CBC with Differential, CANCELED: Comprehensive metabolic panel, CANCELED: CEA  CURRENT THERAPY: Observation  INTERVAL HISTORY: Martin Maldonado 57 y.o. 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 He was restarted back on therapy on 01/24/2012 after a holiday in therapy after 12 cycles of 5FU/Leucovorin + Erbitux with cycle 12 on 10/12/2011. Now back on systemic chemotherapy with 5FU/Leucovorin + Erbitux after progression on CT scan. This was started on 01/24/2012 and he was last treated on 07/25/2012 with 5FU/Leucovorin and Erbitux and his last Erbitux infusion was on 08/08/2012.  Phelix has indicated that he would like a break in therapy for the summer.  As a result, treatment plan and antibody plan has been deleted.  I personally reviewed and went over laboratory results with the patient.  His CEA has dropped to 12.6, but this remains elevated.  His labs are otherwise stable.   I personally reviewed and went over radiographic studies with the patient.  CT scan from April 2014 demonstrated continued response to therapy.    He does admit to some nausea that is lasting.  He reports that it started after his second to last cycle of chemotherapy.  He is using Phenergan which is helpful, but it makes him fall asleep.  It was recommended that he try Zofran every 8 hours PRN and Compazine every 6 hours PRN nausea.  He reports some dizziness with quick positional changes.  Patient education was provided regarding this.  He also admits to a decreased appetite.  "I usually eat 2 steaks, but now I am full after 2 bites."  I suspect this is from either his cancer or his nausea.  For now, we will work on nausea and see if his appetite improves.  He will contact us with  any issues or problems.   Oncologically, he otherwise denies any complaints and ROS questioning is negative.     Past Medical History  Diagnosis Date  . Hypertension   . Rectal cancer 2008    Adenocarcinoma; AP resection in 2008; recurrence with lung metastases in 2011  . Arteriosclerotic cardiovascular disease (ASCVD) 2011    CABG surgery in 2011  . Hyperlipidemia   . 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 Splenomegaly 01/23/2009    Additional GI history includes small bowel obstruction, requiring laparotomy and lysis of adhesions in 2009, remote peptic ulcer disease and postoperative perirectal infection  . ALCOHOL ABUSE, HX OF 01/23/2009    has HYPERLIPIDEMIA; Tobacco abuse, in remission; HYPERTENSION; COPD; SPLENOMEGALY; SMALL BOWEL OBSTRUCTION, HX OF; and Rectal cancer on his problem list.     has No Known Allergies.  Mr. Schippers had no medications administered during this visit.  Past Surgical History  Procedure Laterality Date  . Irrigation and debridement sebaceous cyst    . Laparoscopic lysis intestinal adhesions  2009    With incidental appendectomy  . Coronary artery bypass graft  2011    2 vessel  . Abdominoperineal proctocolectomy  2008  . Portacath placement  2011    Denies any headaches, dizziness, double vision, fevers, chills, night sweats, nausea, vomiting, diarrhea, constipation, chest pain, heart palpitations, shortness of breath, blood in stool, black tarry stool, urinary pain, urinary burning, urinary frequency, hematuria.   PHYSICAL  EXAMINATION  ECOG PERFORMANCE STATUS: 1 - Symptomatic but completely ambulatory  Filed Vitals:   08/17/12 1330  BP: 174/82  Pulse: 68  Temp: 98.4 F (36.9 C)  Resp: 16    GENERAL:alert, no distress, well nourished, well developed, comfortable, cooperative and smiling SKIN: skin color, texture, turgor are normal, positive for: erythematous rash remains from EGFR  inhibitor HEAD: Normocephalic, No masses, lesions, tenderness or abnormalities EYES: normal, Conjunctiva are pink and non-injected EARS: External ears normal OROPHARYNX:mucous membranes are moist  NECK: supple, no adenopathy, trachea midline LYMPH:  not examined BREAST:not examined LUNGS: clear to auscultation  HEART: regular rate & rhythm, no murmurs, no gallops, S1 normal and S2 normal ABDOMEN:abdomen soft, non-tender and normal bowel sounds BACK: Back symmetric, no curvature., No CVA tenderness EXTREMITIES:less then 2 second capillary refill, no joint deformities, effusion, or inflammation, no edema, no skin discoloration, no clubbing, no cyanosis  NEURO: alert & oriented x 3 with fluent speech, no focal motor/sensory deficits, gait normal  LABORATORY DATA: CBC    Component Value Date/Time   WBC 9.4 08/07/2012 1108   RBC 4.24 08/07/2012 1108   HGB 13.9 08/07/2012 1108   HCT 41.5 08/07/2012 1108   PLT 214 08/07/2012 1108   MCV 97.9 08/07/2012 1108   MCH 32.8 08/07/2012 1108   MCHC 33.5 08/07/2012 1108   RDW 16.4* 08/07/2012 1108   LYMPHSABS 1.9 08/07/2012 1108   MONOABS 0.6 08/07/2012 1108   EOSABS 0.3 08/07/2012 1108   BASOSABS 0.1 08/07/2012 1108      Chemistry      Component Value Date/Time   NA 137 08/07/2012 1108   K 4.5 08/07/2012 1108   CL 102 08/07/2012 1108   CO2 23 08/07/2012 1108   BUN 35* 08/07/2012 1108   CREATININE 2.03* 08/07/2012 1108      Component Value Date/Time   CALCIUM 9.6 08/07/2012 1108   ALKPHOS 123* 08/07/2012 1108   AST 18 08/07/2012 1108   ALT 19 08/07/2012 1108   BILITOT 0.4 08/07/2012 1108     Lab Results  Component Value Date   CEA 12.6* 07/24/2012     ASSESSMENT:  1. 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 He was restarted back on therapy on 01/24/2012 after a holiday in therapy after 12 cycles of 5FU/Leucovorin + Erbitux with cycle 12 on 10/12/2011. Now back on systemic chemotherapy with 5FU/Leucovorin + Erbitux after progression on CT  scan. This was started on 01/24/2012 and he was last treated on 07/25/2012 with 5FU/Leucovorin and Erbitux and his last Erbitux infusion was on 08/08/2012. 2. CABG on 06/09/2009.  3. Chronic obstructive pulmonary disease from smoking x many years, though he has quit.  4. Small bowel obstruction requiring surgery in February 2009.  5. Rectal cancer surgery in February 2008 with 1 of 12 positive nodes with LVI and a 4 cm primary, status post 6 cycles of adjuvant oxaliplatin, capecitabine and Avastin since there was concerned he had postoperative disease left behind in the pelvis, and he also received postoperative radiation therapy with concomitant capecitabine at that time.  6. Chronic back pain.  7. Decreased appetite 8. 7 lb weight loss x 4- 5 weeks 9. Nausea   Patient Active Problem List   Diagnosis Date Noted  . Rectal cancer 09/22/2010  . HYPERLIPIDEMIA 11/06/2009  . Tobacco abuse, in remission 09/02/2009  . COPD 01/23/2009  . SPLENOMEGALY 01/23/2009  . SMALL BOWEL OBSTRUCTION, HX OF 01/23/2009  . HYPERTENSION 01/22/2009    PLAN:  1. I personally  reviewed and went over laboratory results with the patient. 2. I personally reviewed and went over radiographic studies with the patient. 3. Labs every 6 weeks: CBC diff, CMET, CEA 4. Patient education regarding orthostatic hypotension 5. Patient education regarding antiemetic regimens. 6. Recommend Zofran every 8 hours and Compazine every 6 hours PRN nausea. 7. Return following CT scans in July.   All questions were answered. The patient knows to call the clinic with any problems, questions or concerns. We can certainly see the patient much sooner if necessary.  Patient and plan will be discussed with Dr. Mariel Sleet within the next 24 hours.    KEFALAS,THOMAS

## 2012-08-17 ENCOUNTER — Encounter (HOSPITAL_BASED_OUTPATIENT_CLINIC_OR_DEPARTMENT_OTHER): Payer: Managed Care, Other (non HMO) | Admitting: Oncology

## 2012-08-17 VITALS — BP 174/82 | HR 68 | Temp 98.4°F | Resp 16 | Wt 208.6 lb

## 2012-08-17 DIAGNOSIS — R110 Nausea: Secondary | ICD-10-CM

## 2012-08-17 DIAGNOSIS — C20 Malignant neoplasm of rectum: Secondary | ICD-10-CM

## 2012-08-17 DIAGNOSIS — C7800 Secondary malignant neoplasm of unspecified lung: Secondary | ICD-10-CM

## 2012-08-17 DIAGNOSIS — R630 Anorexia: Secondary | ICD-10-CM

## 2012-08-17 NOTE — Patient Instructions (Signed)
[facility] Discharge Instructions  RECOMMENDATIONS MADE BY THE CONSULTANT AND ANY TEST RESULTS WILL BE SENT TO YOUR REFERRING PHYSICIAN.  EXAM FINDINGS BY THE PHYSICIAN TODAY AND SIGNS OR SYMPTOMS TO REPORT TO CLINIC OR PRIMARY PHYSICIAN: Exam findings as discussed by T. Kefalas, PA-C.  SPECIAL INSTRUCTIONS/FOLLOW-UP: 1.  Please return for port flush with labs every 6 weeks. 2.  CT end of July/early August 3.  Follow-up with [place] in the office again after your CT.  Thank you for choosing [facility] to provide your oncology and hematology care.  To afford each patient quality time with our providers, please arrive at least 15 minutes before your scheduled appointment time.  With your help, our goal is to use those 15 minutes to complete the necessary work-up to ensure our physicians have the information they need to help with your evaluation and healthcare recommendations.    Effective January 1st, 2014, we ask that you re-schedule your appointment with our physicians should you arrive 10 or more minutes late for your appointment.  We strive to give you quality time with our providers, and arriving late affects you and other patients whose appointments are after yours.    Again, thank you for choosing [facility].  Our hope is that these requests will decrease the amount of time that you wait before being seen by our physicians.       _____________________________________________________________  Should you have questions after your visit to [facility], please contact our office at [phone] between the hours of 8:30 a.m. and 5:00 p.m.  Voicemails left after 4:30 p.m. will not be returned until the following business day.  For prescription refill requests, have your pharmacy contact our office with your prescription refill request.

## 2012-09-12 ENCOUNTER — Other Ambulatory Visit (HOSPITAL_COMMUNITY): Payer: Self-pay | Admitting: Oncology

## 2012-09-19 ENCOUNTER — Encounter (HOSPITAL_COMMUNITY): Payer: Managed Care, Other (non HMO) | Attending: Oncology

## 2012-09-19 DIAGNOSIS — C20 Malignant neoplasm of rectum: Secondary | ICD-10-CM | POA: Insufficient documentation

## 2012-09-19 DIAGNOSIS — Z452 Encounter for adjustment and management of vascular access device: Secondary | ICD-10-CM

## 2012-09-19 LAB — CBC WITH DIFFERENTIAL/PLATELET
Eosinophils Relative: 7 % — ABNORMAL HIGH (ref 0–5)
HCT: 37.6 % — ABNORMAL LOW (ref 39.0–52.0)
Hemoglobin: 13.1 g/dL (ref 13.0–17.0)
Lymphocytes Relative: 23 % (ref 12–46)
Lymphs Abs: 1.8 10*3/uL (ref 0.7–4.0)
MCV: 92.4 fL (ref 78.0–100.0)
Monocytes Absolute: 0.5 10*3/uL (ref 0.1–1.0)
Monocytes Relative: 6 % (ref 3–12)
RBC: 4.07 MIL/uL — ABNORMAL LOW (ref 4.22–5.81)
WBC: 7.8 10*3/uL (ref 4.0–10.5)

## 2012-09-19 LAB — COMPREHENSIVE METABOLIC PANEL
CO2: 23 mEq/L (ref 19–32)
Calcium: 9.4 mg/dL (ref 8.4–10.5)
Creatinine, Ser: 1.43 mg/dL — ABNORMAL HIGH (ref 0.50–1.35)
GFR calc Af Amer: 62 mL/min — ABNORMAL LOW (ref >90)
GFR calc non Af Amer: 54 mL/min — ABNORMAL LOW (ref >90)
Glucose, Bld: 88 mg/dL (ref 70–99)
Total Bilirubin: 0.2 mg/dL — ABNORMAL LOW (ref 0.3–1.2)

## 2012-09-19 MED ORDER — SODIUM CHLORIDE 0.9 % IJ SOLN
10.0000 mL | INTRAMUSCULAR | Status: AC | PRN
  Administered 2012-09-19: 10 mL via INTRAVENOUS
  Filled 2012-09-19: qty 10

## 2012-09-19 MED ORDER — HEPARIN SOD (PORK) LOCK FLUSH 100 UNIT/ML IV SOLN
INTRAVENOUS | Status: AC
  Filled 2012-09-19: qty 5

## 2012-09-19 MED ORDER — HEPARIN SOD (PORK) LOCK FLUSH 100 UNIT/ML IV SOLN
500.0000 [IU] | Freq: Once | INTRAVENOUS | Status: AC
  Administered 2012-09-19: 500 [IU] via INTRAVENOUS
  Filled 2012-09-19: qty 5

## 2012-09-20 ENCOUNTER — Encounter (HOSPITAL_COMMUNITY): Payer: Self-pay

## 2012-09-28 ENCOUNTER — Other Ambulatory Visit (HOSPITAL_COMMUNITY): Payer: Managed Care, Other (non HMO)

## 2012-10-17 ENCOUNTER — Ambulatory Visit (HOSPITAL_COMMUNITY): Payer: Managed Care, Other (non HMO)

## 2012-10-17 ENCOUNTER — Encounter (HOSPITAL_COMMUNITY): Payer: Self-pay

## 2012-10-17 ENCOUNTER — Ambulatory Visit (HOSPITAL_COMMUNITY)
Admission: RE | Admit: 2012-10-17 | Discharge: 2012-10-17 | Disposition: A | Discharge status: Home / Self Care | Payer: Managed Care, Other (non HMO) | Source: Ambulatory Visit | Attending: Oncology | Admitting: Oncology

## 2012-10-17 DIAGNOSIS — C20 Malignant neoplasm of rectum: Secondary | ICD-10-CM | POA: Insufficient documentation

## 2012-10-17 DIAGNOSIS — K8020 Calculus of gallbladder without cholecystitis without obstruction: Secondary | ICD-10-CM | POA: Insufficient documentation

## 2012-10-17 DIAGNOSIS — N200 Calculus of kidney: Secondary | ICD-10-CM | POA: Insufficient documentation

## 2012-10-17 MED ORDER — IOHEXOL 300 MG/ML  SOLN
100.0000 mL | Freq: Once | INTRAMUSCULAR | Status: AC | PRN
  Administered 2012-10-17: 100 mL via INTRAVENOUS

## 2012-10-20 ENCOUNTER — Other Ambulatory Visit (HOSPITAL_COMMUNITY): Payer: Self-pay | Admitting: Hematology and Oncology

## 2012-10-20 ENCOUNTER — Encounter (HOSPITAL_COMMUNITY): Payer: Self-pay | Admitting: Oncology

## 2012-10-20 ENCOUNTER — Encounter (HOSPITAL_COMMUNITY): Payer: Managed Care, Other (non HMO) | Attending: Oncology | Admitting: Oncology

## 2012-10-20 VITALS — BP 168/86 | HR 67 | Temp 97.5°F | Resp 18 | Wt 207.1 lb

## 2012-10-20 DIAGNOSIS — C20 Malignant neoplasm of rectum: Secondary | ICD-10-CM | POA: Insufficient documentation

## 2012-10-20 NOTE — Patient Instructions (Addendum)
[facility] Discharge Instructions  RECOMMENDATIONS MADE BY THE CONSULTANT AND ANY TEST RESULTS WILL BE SENT TO YOUR REFERRING PHYSICIAN.  Restart chemotherapy next week. MD appointment as scheduled.  Thank you for choosing [facility] to provide your oncology and hematology care.  To afford each patient quality time with our providers, please arrive at least 15 minutes before your scheduled appointment time.  With your help, our goal is to use those 15 minutes to complete the necessary work-up to ensure our physicians have the information they need to help with your evaluation and healthcare recommendations.    Effective January 1st, 2014, we ask that you re-schedule your appointment with our physicians should you arrive 10 or more minutes late for your appointment.  We strive to give you quality time with our providers, and arriving late affects you and other patients whose appointments are after yours.    Again, thank you for choosing [facility].  Our hope is that these requests will decrease the amount of time that you wait before being seen by our physicians.       _____________________________________________________________  Should you have questions after your visit to [facility], please contact our office at [phone] between the hours of 8:30 a.m. and 5:00 p.m.  Voicemails left after 4:30 p.m. will not be returned until the following business day.  For prescription refill requests, have your pharmacy contact our office with your prescription refill request.

## 2012-10-20 NOTE — Progress Notes (Signed)
[facility], MD [address]  [place] [address]  Rectal cancer  CURRENT THERAPY: Now in need to return back to systemic therapy.  INTERVAL HISTORY: Martin Maldonado 57 y.o. 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 He was restarted back on therapy on 01/24/2012 after a holiday in therapy after 12 cycles of 5FU/Leucovorin + Erbitux with cycle 12 on 10/12/2011. Now back on systemic chemotherapy with 5FU/Leucovorin + Erbitux after progression on CT scan. This was started on 01/24/2012 and he was last treated on 07/25/2012 with 5FU/Leucovorin and Erbitux and his last Erbitux infusion was on 08/08/2012.  Martin Maldonado was given a break on therapy and had restaging scans performed on 10/17/2012.  I personally reviewed and went over radiographic studies with the patient.  His scans show clear progression of disease in his lungs.  The full report follows.  With this information, I have recommended that we restart chemotherapy.  We spent some time discussing future chemotherapy regimen.  We will restart the same chemotherapy he received last time prior to his chemotherapy holiday.  It will consist of 5FU/Leucovorin/Cetuximab.  We will change his Cetuximab from weekly to every 2 weeks.  Tolerability should be equivalent and will save him from coming into the clinic for chemotherapy on a weekly basis.   He is agreeable to this plan.  We discussed the risks, benefits, alternatives, and side effects of therapy.  He may forego chemotherapy teaching since he was already taught about this regimen last year.  Oncologically, he denies any complaints and ROS questioning is negative.  He admits to intermittent, left chest burning and pain, particularly when he sleep on his left side.  Etiology is not clear, but I wonder if it is secondary to his pulmonary progression.     Past Medical History  Diagnosis Date  . Hypertension   . Rectal cancer 2008   Adenocarcinoma; AP resection in 2008; recurrence with lung metastases in 2011  . Arteriosclerotic cardiovascular disease (ASCVD) 2011    CABG surgery in 2011  . Hyperlipidemia   . 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 Splenomegaly 01/23/2009    Additional GI history includes small bowel obstruction, requiring laparotomy and lysis of adhesions in 2009, remote peptic ulcer disease and postoperative perirectal infection  . ALCOHOL ABUSE, HX OF 01/23/2009    has HYPERLIPIDEMIA; Tobacco abuse, in remission; HYPERTENSION; COPD; SPLENOMEGALY; SMALL BOWEL OBSTRUCTION, HX OF; and Rectal cancer on his problem list.     has No Known Allergies.  Martin Maldonado had no medications administered during this visit.  Past Surgical History  Procedure Laterality Date  . Irrigation and debridement sebaceous cyst    . Laparoscopic lysis intestinal adhesions  2009    With incidental appendectomy  . Coronary artery bypass graft  2011    2 vessel  . Abdominoperineal proctocolectomy  2008  . Portacath placement  2011    Denies any headaches, dizziness, double vision, fevers, chills, night sweats, nausea, vomiting, diarrhea, constipation, chest pain, heart palpitations, shortness of breath, blood in stool, black tarry stool, urinary pain, urinary burning, urinary frequency, hematuria.   PHYSICAL EXAMINATION  ECOG PERFORMANCE STATUS: 0 - Asymptomatic  Filed Vitals:   10/20/12 0900  BP: 168/86  Pulse: 67  Temp: 97.5 F (36.4 C)  Resp: 18    GENERAL:alert, no distress, well nourished, well developed, comfortable, cooperative and smiling SKIN: skin color,  texture, turgor are normal, no rashes or significant lesions HEAD: Normocephalic, No masses, lesions, tenderness or abnormalities EYES: normal, PERRLA, EOMI, Conjunctiva are pink and non-injected EARS: External ears normal OROPHARYNX:mucous membranes are moist  NECK: supple, no adenopathy, thyroid normal  size, non-tender, without nodularity, no stridor, non-tender, trachea midline LYMPH:  no palpable lymphadenopathy BREAST:not examined LUNGS: clear to auscultation and percussion HEART: regular rate & rhythm, no murmurs, no gallops, S1 normal and S2 normal ABDOMEN:abdomen soft, non-tender, normal bowel sounds BACK: Back symmetric, no curvature. EXTREMITIES:less then 2 second capillary refill, no joint deformities, effusion, or inflammation, no edema, no skin discoloration, no clubbing, no cyanosis  NEURO: alert & oriented x 3 with fluent speech, no focal motor/sensory deficits, gait normal    LABORATORY DATA: CBC    Component Value Date/Time   WBC 7.8 09/19/2012 1330   RBC 4.07* 09/19/2012 1330   HGB 13.1 09/19/2012 1330   HCT 37.6* 09/19/2012 1330   PLT 260 09/19/2012 1330   MCV 92.4 09/19/2012 1330   MCH 32.2 09/19/2012 1330   MCHC 34.8 09/19/2012 1330   RDW 14.0 09/19/2012 1330   LYMPHSABS 1.8 09/19/2012 1330   MONOABS 0.5 09/19/2012 1330   EOSABS 0.6 09/19/2012 1330   BASOSABS 0.1 09/19/2012 1330      Chemistry      Component Value Date/Time   NA 140 09/19/2012 1330   K 4.0 09/19/2012 1330   CL 104 09/19/2012 1330   CO2 23 09/19/2012 1330   BUN 18 09/19/2012 1330   CREATININE 1.43* 09/19/2012 1330      Component Value Date/Time   CALCIUM 9.4 09/19/2012 1330   ALKPHOS 124* 09/19/2012 1330   AST 21 09/19/2012 1330   ALT 15 09/19/2012 1330   BILITOT 0.2* 09/19/2012 1330     Lab Results  Component Value Date   CEA 14.8* 09/19/2012      RADIOGRAPHIC STUDIES:  10/17/2012  *RADIOLOGY REPORT*  Clinical Data: Follow up metastatic rectal cancer originally  diagnosed in 2008. The patient's most recent chemotherapy  treatment was approximately 8 weeks ago. Prior history of  radiation therapy. Prior abdominoperineal proctocolectomy in 2008.  CT CHEST, ABDOMEN AND PELVIS WITH CONTRAST  Technique: Multidetector CT imaging of the chest, abdomen and  pelvis was performed following the standard protocol during  bolus  administration of intravenous contrast.  Contrast: OMNIPAQUE IOHEXOL 300 MG/ML IV. Oral contrast was  also administered.  Comparison: Multiple prior CT chest, abdomen and pelvis  examinations, most recently 04/19/2012.  CT CHEST  Findings: Multiple bilateral pulmonary parenchymal nodules and  masses, index lesion in the posterior left lower lobe with current  measurements approximating 4.3 x 3.1 cm (series 3, image 44),  previously 1.5 x 2.8 cm. Index posterior lesion with current  measurements approximating 2.8 x 3.7 cm (image 36), previously 2.1  x 2.1 cm. Pleural based masses posterolaterally on the left have  increased in size. Other nodules have similarly increased in size.  No definite new nodules or masses. Emphysematous changes  throughout both lungs, unchanged. No confluent airspace  consolidation. Small loculated left pleural effusion at the site  of the pleural metastases. No right pleural effusion.  Interval increase in size of left hilar lymphadenopathy, current  measurements approximating 3.8 x 2.3 cm (series 2, image 25),  previously 2.5 x 1.1 cm. Interval increase in size of periaortic  lymph nodes and a precarinal lymph node. Interval increase in size  of subcarinal lymph nodes, current measurements approximating 1.3 x  3.5 cm (image 31). No axillary lymphadenopathy. No visible  lymphadenopathy in the lower supraclavicular region.  Right subclavian Port-A-Cath tip in the mid SVC. Heart size  normal. Prior sternotomy for CABG. No pericardial effusion.  Moderate atherosclerosis involving the thoracic aorta and the  proximal great vessels. Thyroid gland normal in appearance.  Bone window images demonstrate mild thoracic spondylosis but no  evidence of osseous metastatic disease.  IMPRESSION:  1. Progression of widespread metastatic disease to the thorax as  detailed above.  2. COPD/emphysema. No acute cardiopulmonary disease.  CT ABDOMEN AND PELVIS    Findings: Stable approximate 1 cm cyst in the posterior segment  right lobe of liver. Calcified granuloma in the anterior segment  right lobe approaching the dome. No evidence of hepatic  metastasis. Normal appearing spleen, pancreas, and adrenal glands.  Focus of accessory splenic tissue anterior to the tail of the  pancreas. Non-obstructing small (2 mm) calculus in a lower pole  calix of the right kidney delayed excretion of contrast by the  right kidney relative to the left, though no obstructing ureteral  calculus is identified. Normal-appearing left kidney. 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  Solitary small (approximate 3 mm) gallstone within the otherwise  normal-appearing gallbladder. No biliary ductal dilation.  Normal-appearing stomach and small bowel. 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 No evidence of recurrent tumor in the pelvis.  Satisfactory appearance of the sigmoid colostomy in the left upper  pelvis. No evidence of parastomal hernia. Thinning of the rectus  sheath anteriorly with a small abdominal wall hernia to the right  of midline in the upper abdomen, containing fat. No ascites. No  significant lymphadenopathy.  Urinary bladder decompressed unremarkable. Stable mild median lobe  prostate gland enlargement. Phlebolith low in the right side of  the pelvis.  Bone window images demonstrate mild degenerative changes involving  the facet joints at L4-5 and L5-S1, but no evidence of osseous  metastatic disease.  IMPRESSION:  1. No evidence of recurrent or metastatic disease in the abdomen  or pelvis.  2. 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  3. 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  4. Cholelithiasis.  5. Thinning of the rectus sheath anteriorly with a small anterior  abdominal wall hernia to the  right of midline in the upper abdomen  containing fat.  6. Non-obstructing approximate 2 mm calculus in a lower pole calix  of the right kidney. There is delayed excretion of contrast by the  right kidney though there is no obstructing right ureteral calculus  and there is no evidence of hydronephrosis. This was not present on  the prior examinations and may represent developing right urinary  tract obstruction due to the pelvic fibrosis.  Original Report Authenticated By: Hulan Saas, M.D.     ASSESSMENT:  1. 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 He was restarted back on therapy on 01/24/2012 after a holiday in therapy after 12 cycles of 5FU/Leucovorin + Erbitux with cycle 12 on 10/12/2011. Now back on systemic chemotherapy with 5FU/Leucovorin + Erbitux after progression on CT scan. This was started on 01/24/2012 and he was last treated on 07/25/2012 with 5FU/Leucovorin and Erbitux and his last Erbitux infusion was on 08/08/2012. Now with restaging scans demonstrating clear progression on 10/17/2012 requiring re-initiation of chemotherapy.  2. CABG on 06/09/2009.  3. Chronic obstructive pulmonary disease from smoking  x many years, though he has quit.  4. Small bowel obstruction requiring surgery in February 2009.  5. Rectal cancer surgery in February 2008 with 1 of 12 positive nodes with LVI and a 4 cm primary, status post 6 cycles of adjuvant oxaliplatin, capecitabine and Avastin since there was concerned he had postoperative disease left behind in the pelvis, and he also received postoperative radiation therapy with concomitant capecitabine at that time.  6. Chronic back pain.   Patient Active Problem List   Diagnosis Date Noted  . Rectal cancer 09/22/2010  . HYPERLIPIDEMIA 11/06/2009  . Tobacco abuse, in remission 09/02/2009  . COPD 01/23/2009  . SPLENOMEGALY 01/23/2009  . SMALL BOWEL OBSTRUCTION, HX OF 01/23/2009  . HYPERTENSION 01/22/2009      PLAN:  1. I  personally reviewed and went over radiographic studies with the patient. 2. We reviewed the images in the exam room and compared them to his April films. 3. Discussed the risks, benefits, alternatives, and side effects of chemotherapy.  4. Recommended returning to his previous chemotherapy regimen. 5. Built 5FU/Leucovorin treatment plan every 2 weeks.  6. Developed Cetuximab antibody plan at 500 mg/m2 every 2 weeks.  7. Pre-chemo labs: CBC diff, CMET 8. Return in 6 week for follow-up.   THERAPY PLAN:  He will restart chemotherapy.  We will restage him with scan following 3 cycles of chemotherapy.   All questions were answered. The patient knows to call the clinic with any problems, questions or concerns. We can certainly see the patient much sooner if necessary.  Patient and plan discussed with Dr. Erline Hau and he is in agreement with the aforementioned.   Bartlomiej Jenkinson

## 2012-10-25 ENCOUNTER — Inpatient Hospital Stay (HOSPITAL_COMMUNITY): Payer: Managed Care, Other (non HMO)

## 2012-10-27 ENCOUNTER — Encounter (HOSPITAL_COMMUNITY): Payer: Managed Care, Other (non HMO)

## 2012-10-30 ENCOUNTER — Encounter (HOSPITAL_BASED_OUTPATIENT_CLINIC_OR_DEPARTMENT_OTHER): Payer: Managed Care, Other (non HMO)

## 2012-10-30 DIAGNOSIS — C20 Malignant neoplasm of rectum: Secondary | ICD-10-CM

## 2012-10-30 LAB — CBC WITH DIFFERENTIAL/PLATELET
Basophils Absolute: 0.1 10*3/uL (ref 0.0–0.1)
HCT: 39.2 % (ref 39.0–52.0)
Hemoglobin: 12.9 g/dL — ABNORMAL LOW (ref 13.0–17.0)
Lymphocytes Relative: 19 % (ref 12–46)
Lymphs Abs: 1.7 10*3/uL (ref 0.7–4.0)
MCV: 88.3 fL (ref 78.0–100.0)
Monocytes Absolute: 0.6 10*3/uL (ref 0.1–1.0)
Neutro Abs: 6.3 10*3/uL (ref 1.7–7.7)
RBC: 4.44 MIL/uL (ref 4.22–5.81)
RDW: 13.8 % (ref 11.5–15.5)
WBC: 9.2 10*3/uL (ref 4.0–10.5)

## 2012-10-30 LAB — COMPREHENSIVE METABOLIC PANEL
AST: 15 U/L (ref 0–37)
Albumin: 3.7 g/dL (ref 3.5–5.2)
Alkaline Phosphatase: 107 U/L (ref 39–117)
BUN: 19 mg/dL (ref 6–23)
CO2: 26 mEq/L (ref 19–32)
Chloride: 102 mEq/L (ref 96–112)
GFR calc non Af Amer: 60 mL/min — ABNORMAL LOW (ref >90)
Potassium: 4 mEq/L (ref 3.5–5.1)
Total Bilirubin: 0.3 mg/dL (ref 0.3–1.2)

## 2012-10-30 NOTE — Progress Notes (Signed)
Labs drawn today for cbc/diff,cmp,cea 

## 2012-10-31 ENCOUNTER — Encounter (HOSPITAL_BASED_OUTPATIENT_CLINIC_OR_DEPARTMENT_OTHER): Payer: Managed Care, Other (non HMO)

## 2012-10-31 ENCOUNTER — Encounter (HOSPITAL_BASED_OUTPATIENT_CLINIC_OR_DEPARTMENT_OTHER): Payer: Managed Care, Other (non HMO) | Admitting: Oncology

## 2012-10-31 VITALS — BP 111/70 | HR 71 | Temp 98.0°F | Resp 18 | Wt 207.0 lb

## 2012-10-31 DIAGNOSIS — M7989 Other specified soft tissue disorders: Secondary | ICD-10-CM

## 2012-10-31 DIAGNOSIS — Z5112 Encounter for antineoplastic immunotherapy: Secondary | ICD-10-CM

## 2012-10-31 DIAGNOSIS — C20 Malignant neoplasm of rectum: Secondary | ICD-10-CM

## 2012-10-31 DIAGNOSIS — R21 Rash and other nonspecific skin eruption: Secondary | ICD-10-CM

## 2012-10-31 LAB — MAGNESIUM: Magnesium: 1.8 mg/dL (ref 1.5–2.5)

## 2012-10-31 MED ORDER — SODIUM CHLORIDE 0.9 % IV SOLN
2400.0000 mg/m2 | INTRAVENOUS | Status: DC
  Filled 2012-10-31: qty 106

## 2012-10-31 MED ORDER — SODIUM CHLORIDE 0.9 % IV SOLN
Freq: Once | INTRAVENOUS | Status: AC
  Administered 2012-10-31: 09:00:00 via INTRAVENOUS

## 2012-10-31 MED ORDER — METHYLPREDNISOLONE SODIUM SUCC 125 MG IJ SOLR
125.0000 mg | INTRAMUSCULAR | Status: AC
  Administered 2012-10-31: 125 mg via INTRAVENOUS

## 2012-10-31 MED ORDER — HEPARIN SOD (PORK) LOCK FLUSH 100 UNIT/ML IV SOLN
500.0000 [IU] | Freq: Once | INTRAVENOUS | Status: AC | PRN
  Administered 2012-10-31: 500 [IU]
  Filled 2012-10-31: qty 5

## 2012-10-31 MED ORDER — LORAZEPAM 2 MG/ML IJ SOLN
INTRAMUSCULAR | Status: AC
  Filled 2012-10-31: qty 1

## 2012-10-31 MED ORDER — DIPHENHYDRAMINE HCL 50 MG/ML IJ SOLN
INTRAMUSCULAR | Status: AC
  Filled 2012-10-31: qty 1

## 2012-10-31 MED ORDER — HEPARIN SOD (PORK) LOCK FLUSH 100 UNIT/ML IV SOLN
INTRAVENOUS | Status: AC
  Filled 2012-10-31: qty 5

## 2012-10-31 MED ORDER — SODIUM CHLORIDE 0.9 % IV SOLN
10.0000 mg | Freq: Once | INTRAVENOUS | Status: AC
  Administered 2012-10-31: 10 mg via INTRAVENOUS
  Filled 2012-10-31: qty 1

## 2012-10-31 MED ORDER — FLUOROURACIL CHEMO INJECTION 2.5 GM/50ML
400.0000 mg/m2 | Freq: Once | INTRAVENOUS | Status: DC
  Filled 2012-10-31: qty 18

## 2012-10-31 MED ORDER — LORAZEPAM 2 MG/ML IJ SOLN
0.5000 mg | Freq: Once | INTRAMUSCULAR | Status: AC
  Administered 2012-10-31: 0.5 mg via INTRAVENOUS

## 2012-10-31 MED ORDER — FAMOTIDINE IN NACL 20-0.9 MG/50ML-% IV SOLN
20.0000 mg | Freq: Two times a day (BID) | INTRAVENOUS | Status: DC
  Administered 2012-10-31: 20 mg via INTRAVENOUS

## 2012-10-31 MED ORDER — SODIUM CHLORIDE 0.9 % IV SOLN
8.0000 mg | INTRAVENOUS | Status: AC
  Administered 2012-10-31: 8 mg via INTRAVENOUS
  Filled 2012-10-31: qty 4

## 2012-10-31 MED ORDER — METHYLPREDNISOLONE SODIUM SUCC 125 MG IJ SOLR
INTRAMUSCULAR | Status: AC
  Filled 2012-10-31: qty 2

## 2012-10-31 MED ORDER — LEUCOVORIN CALCIUM INJECTION 350 MG: 400.0000 mg/m2 | Freq: Once | INTRAVENOUS | Status: DC

## 2012-10-31 MED ORDER — DEXAMETHASONE SODIUM PHOSPHATE 10 MG/ML IJ SOLN: 10.0000 mg | Freq: Once | INTRAMUSCULAR | Status: DC

## 2012-10-31 MED ORDER — DIPHENHYDRAMINE HCL 50 MG/ML IJ SOLN
50.0000 mg | Freq: Once | INTRAMUSCULAR | Status: AC
  Administered 2012-10-31: 50 mg via INTRAVENOUS

## 2012-10-31 MED ORDER — LEUCOVORIN CALCIUM INJECTION 100 MG
20.0000 mg/m2 | Freq: Once | INTRAMUSCULAR | Status: DC
  Filled 2012-10-31: qty 2.2

## 2012-10-31 MED ORDER — CETUXIMAB CHEMO IV INJECTION 200 MG/100ML
500.0000 mg/m2 | Freq: Once | INTRAVENOUS | Status: AC
  Administered 2012-10-31: 1100 mg via INTRAVENOUS
  Filled 2012-10-31: qty 550

## 2012-10-31 MED ORDER — FAMOTIDINE IN NACL 20-0.9 MG/50ML-% IV SOLN
INTRAVENOUS | Status: AC
  Filled 2012-10-31: qty 50

## 2012-10-31 NOTE — Progress Notes (Signed)
1150 pt reports that his hands are swelling. Hands are noticeably swollen, pt reports it started about 30 mins ago. Pulled pt's shirt up, bright red rash noted over entire back and to anterior surface of arms. Denies any complaints other than swollen hands. T.Kefalas, PA called to pt's side. Medications administered as ordered.

## 2012-10-31 NOTE — Progress Notes (Signed)
Nurse got me while in dictation room reporting that she needed me ASAP.  As a a result, I immediately reported to chemotherapy area where I saw Martin Maldonado in a chemotherapy chair.  He complained of B/L hand swelling and tightness and rash on back.  He received about 1.5 hrs of Cetuxumab only and reports that he started to notice the rash and hand swelling about 30 minutes ago, but he did not inform the nurse.   He reports that he felt fine.   Suspecting an allergic reaction, I ordered 20 mg of IV Pepcid, 125 mg of Solumedrol, stop Cetuximab, and give 500 cc/hr of NS.  As the nurse was quickly gathering these medication, I recognized the patient becoming more pale.  He reported that he felt fine.  He denies any itching, SOB, wheezing, or chest or back pain.  I quickly ascertained a Dynamap and ascertained vitals. I noted a O2 of 79%, and BP of 69/49.  I quickly ordered a rapid response and crash cart was escorted into chemotherapy room.  He was noted to become diaphoretic.  By this time, SoluMedrol was given, Pepcid was running.  He already received premedications consisting of Dexamethasone and Benadryl.  He was connected to O2 wide open via nasal cannula.  His O2 sat recovered to high 90's and eventually 100%.  Respiratory quickly reported and observed for approximately 5 minutes, not requiring intervention.  The patient recovered and serial vitals improved.  His color recovered and his breathing improved. His vital stabilized.  He was given fluids and discharged home.    I discussed the case with Dr. Truett Perna (Hem/Onc).  He recommended FOLFIRI + Avastin in the future.  He would exchange Vectibux for Avastin after 6 treatments if no improvement or progression in imaging studies.  Treatment plan altered to reflect change and addition of Avastin.   All questions were answered.   Patient and plan discussed with Dr. Benita Gutter and he is in agreement with the aforementioned.   Martin Maldonado

## 2012-11-02 ENCOUNTER — Encounter (HOSPITAL_COMMUNITY): Payer: Managed Care, Other (non HMO)

## 2012-11-07 ENCOUNTER — Other Ambulatory Visit (HOSPITAL_COMMUNITY): Payer: Managed Care, Other (non HMO)

## 2012-11-08 ENCOUNTER — Inpatient Hospital Stay (HOSPITAL_COMMUNITY): Payer: Managed Care, Other (non HMO)

## 2012-11-09 ENCOUNTER — Other Ambulatory Visit (HOSPITAL_COMMUNITY): Payer: Managed Care, Other (non HMO)

## 2012-11-10 ENCOUNTER — Encounter (HOSPITAL_COMMUNITY): Payer: Managed Care, Other (non HMO)

## 2012-11-13 ENCOUNTER — Encounter (HOSPITAL_BASED_OUTPATIENT_CLINIC_OR_DEPARTMENT_OTHER): Payer: Managed Care, Other (non HMO)

## 2012-11-13 DIAGNOSIS — C20 Malignant neoplasm of rectum: Secondary | ICD-10-CM

## 2012-11-13 LAB — CBC WITH DIFFERENTIAL/PLATELET
Basophils Absolute: 0.1 10*3/uL (ref 0.0–0.1)
Eosinophils Relative: 6 % — ABNORMAL HIGH (ref 0–5)
HCT: 40.6 % (ref 39.0–52.0)
Lymphocytes Relative: 17 % (ref 12–46)
Lymphs Abs: 1.6 10*3/uL (ref 0.7–4.0)
MCV: 88.1 fL (ref 78.0–100.0)
Neutro Abs: 6.6 10*3/uL (ref 1.7–7.7)
Platelets: 285 10*3/uL (ref 150–400)
RBC: 4.61 MIL/uL (ref 4.22–5.81)
RDW: 14.4 % (ref 11.5–15.5)
WBC: 9.5 10*3/uL (ref 4.0–10.5)

## 2012-11-13 LAB — COMPREHENSIVE METABOLIC PANEL
ALT: 14 U/L (ref 0–53)
AST: 17 U/L (ref 0–37)
Alkaline Phosphatase: 115 U/L (ref 39–117)
CO2: 24 mEq/L (ref 19–32)
Calcium: 9.9 mg/dL (ref 8.4–10.5)
Chloride: 104 mEq/L (ref 96–112)
GFR calc Af Amer: 54 mL/min — ABNORMAL LOW (ref >90)
GFR calc non Af Amer: 46 mL/min — ABNORMAL LOW (ref >90)
Glucose, Bld: 108 mg/dL — ABNORMAL HIGH (ref 70–99)
Sodium: 139 mEq/L (ref 135–145)
Total Bilirubin: 0.3 mg/dL (ref 0.3–1.2)

## 2012-11-13 NOTE — Progress Notes (Signed)
Martin Maldonado presented for labwork. Labs per MD order drawn via Peripheral Line 23 gauge needle inserted in RT AC  Good blood return present. Procedure without incident.  Needle removed intact. Patient tolerated procedure well.

## 2012-11-14 ENCOUNTER — Encounter (HOSPITAL_BASED_OUTPATIENT_CLINIC_OR_DEPARTMENT_OTHER): Payer: Managed Care, Other (non HMO) | Admitting: Oncology

## 2012-11-14 ENCOUNTER — Encounter (HOSPITAL_BASED_OUTPATIENT_CLINIC_OR_DEPARTMENT_OTHER): Payer: Managed Care, Other (non HMO)

## 2012-11-14 VITALS — BP 166/88 | HR 60 | Temp 97.4°F | Resp 18 | Wt 204.0 lb

## 2012-11-14 DIAGNOSIS — C20 Malignant neoplasm of rectum: Secondary | ICD-10-CM

## 2012-11-14 DIAGNOSIS — Z5112 Encounter for antineoplastic immunotherapy: Secondary | ICD-10-CM

## 2012-11-14 DIAGNOSIS — Z5111 Encounter for antineoplastic chemotherapy: Secondary | ICD-10-CM

## 2012-11-14 LAB — URINALYSIS, DIPSTICK ONLY
Bilirubin Urine: NEGATIVE
Hgb urine dipstick: NEGATIVE
Nitrite: NEGATIVE
Specific Gravity, Urine: 1.02 (ref 1.005–1.030)
pH: 6 (ref 5.0–8.0)

## 2012-11-14 MED ORDER — LORAZEPAM 2 MG/ML IJ SOLN
0.5000 mg | Freq: Once | INTRAMUSCULAR | Status: AC
  Administered 2012-11-14: 0.5 mg via INTRAVENOUS

## 2012-11-14 MED ORDER — SODIUM CHLORIDE 0.9 % IV SOLN
2400.0000 mg/m2 | INTRAVENOUS | Status: DC
  Administered 2012-11-14: 5300 mg via INTRAVENOUS
  Filled 2012-11-14: qty 106

## 2012-11-14 MED ORDER — SODIUM CHLORIDE 0.9 % IV SOLN
Freq: Once | INTRAVENOUS | Status: AC
  Administered 2012-11-14: 10:00:00 via INTRAVENOUS

## 2012-11-14 MED ORDER — SODIUM CHLORIDE 0.9 % IV SOLN
5.0000 mg/kg | Freq: Once | INTRAVENOUS | Status: AC
  Administered 2012-11-14: 475 mg via INTRAVENOUS
  Filled 2012-11-14: qty 19

## 2012-11-14 MED ORDER — SODIUM CHLORIDE 0.9 % IJ SOLN
10.0000 mL | INTRAMUSCULAR | Status: DC | PRN
  Filled 2012-11-14: qty 10

## 2012-11-14 MED ORDER — LORAZEPAM 2 MG/ML IJ SOLN
INTRAMUSCULAR | Status: AC
  Filled 2012-11-14: qty 1

## 2012-11-14 MED ORDER — FLUOROURACIL CHEMO INJECTION 2.5 GM/50ML
400.0000 mg/m2 | Freq: Once | INTRAVENOUS | Status: AC
  Administered 2012-11-14: 900 mg via INTRAVENOUS
  Filled 2012-11-14: qty 18

## 2012-11-14 MED ORDER — LEUCOVORIN CALCIUM INJECTION 350 MG
400.0000 mg/m2 | Freq: Once | INTRAVENOUS | Status: AC
  Administered 2012-11-14: 880 mg via INTRAVENOUS
  Filled 2012-11-14: qty 44

## 2012-11-14 MED ORDER — SODIUM CHLORIDE 0.9 % IV SOLN: Freq: Once | INTRAVENOUS | Status: DC

## 2012-11-14 NOTE — Patient Instructions (Addendum)
[facility] Discharge Instructions  RECOMMENDATIONS MADE BY THE CONSULTANT AND ANY TEST RESULTS WILL BE SENT TO YOUR REFERRING PHYSICIAN.  MEDICATIONS PRESCRIBED:  None  INSTRUCTIONS GIVEN AND DISCUSSED: Chemotherapy every 2 weeks as ordered. Pre-chemotherapy lab work every 2 weeks as scheduled  SPECIAL INSTRUCTIONS/FOLLOW-UP: Return in 4 weeks for follow-up  Thank you for choosing [facility] to provide your oncology and hematology care.  To afford each patient quality time with our providers, please arrive at least 15 minutes before your scheduled appointment time.  With your help, our goal is to use those 15 minutes to complete the necessary work-up to ensure our physicians have the information they need to help with your evaluation and healthcare recommendations.    Effective January 1st, 2014, we ask that you re-schedule your appointment with our physicians should you arrive 10 or more minutes late for your appointment.  We strive to give you quality time with our providers, and arriving late affects you and other patients whose appointments are after yours.    Again, thank you for choosing [facility].  Our hope is that these requests will decrease the amount of time that you wait before being seen by our physicians.       _____________________________________________________________  Should you have questions after your visit to [facility], please contact our office at [phone] between the hours of 8:30 a.m. and 5:00 p.m.  Voicemails left after 4:30 p.m. will not be returned until the following business day.  For prescription refill requests, have your pharmacy contact our office with your prescription refill request.

## 2012-11-14 NOTE — Progress Notes (Signed)
Tolerated well

## 2012-11-14 NOTE — Progress Notes (Signed)
Patient is seen as a work-in today following reaction to Erbitux 2 weeks ago.  He reports that his hand swelling has resolved and it required 4 days until it resolved.  His rash has completely resolved as well.    I have presented his case to Dr. Truett Perna who recommends he continue with 5FU/leucovorin (Modified [place]) with the addition of Avastin 5 mg/kg which he tolerated well in the past.  We will see him back in 1 month for follow-up and continue with the aforementioned regimen as ordered.   Martin Maldonado

## 2012-11-16 ENCOUNTER — Encounter (HOSPITAL_BASED_OUTPATIENT_CLINIC_OR_DEPARTMENT_OTHER): Payer: Managed Care, Other (non HMO)

## 2012-11-16 DIAGNOSIS — C20 Malignant neoplasm of rectum: Secondary | ICD-10-CM

## 2012-11-16 DIAGNOSIS — Z452 Encounter for adjustment and management of vascular access device: Secondary | ICD-10-CM

## 2012-11-16 MED ORDER — SODIUM CHLORIDE 0.9 % IJ SOLN
10.0000 mL | INTRAMUSCULAR | Status: DC | PRN
  Administered 2012-11-16: 10 mL
  Filled 2012-11-16: qty 10

## 2012-11-16 MED ORDER — HEPARIN SOD (PORK) LOCK FLUSH 100 UNIT/ML IV SOLN
INTRAVENOUS | Status: AC
  Filled 2012-11-16: qty 5

## 2012-11-16 MED ORDER — HEPARIN SOD (PORK) LOCK FLUSH 100 UNIT/ML IV SOLN
500.0000 [IU] | Freq: Once | INTRAVENOUS | Status: AC | PRN
  Administered 2012-11-16: 500 [IU]
  Filled 2012-11-16: qty 5

## 2012-11-16 NOTE — Progress Notes (Signed)
Martin Maldonado presented for Portacath access and flush. After continous infusion 40fu over 46 hrs. Tolerated well. Portacath located rt chest wall accessed with  H 20 needle. Good blood return present. Portacath flushed with 20ml NS and 500U/44ml Heparin and needle removed intact. Procedure without incident. Patient tolerated procedure well.

## 2012-11-21 ENCOUNTER — Ambulatory Visit (HOSPITAL_COMMUNITY): Payer: Managed Care, Other (non HMO) | Admitting: Oncology

## 2012-11-27 ENCOUNTER — Encounter (HOSPITAL_COMMUNITY): Payer: Managed Care, Other (non HMO) | Attending: Oncology

## 2012-11-27 ENCOUNTER — Other Ambulatory Visit (HOSPITAL_COMMUNITY): Payer: Managed Care, Other (non HMO)

## 2012-11-27 DIAGNOSIS — C20 Malignant neoplasm of rectum: Secondary | ICD-10-CM | POA: Insufficient documentation

## 2012-11-27 LAB — CBC WITH DIFFERENTIAL/PLATELET
Eosinophils Relative: 7 % — ABNORMAL HIGH (ref 0–5)
HCT: 39.8 % (ref 39.0–52.0)
Hemoglobin: 13.2 g/dL (ref 13.0–17.0)
Lymphocytes Relative: 21 % (ref 12–46)
Lymphs Abs: 1.4 10*3/uL (ref 0.7–4.0)
MCV: 87.1 fL (ref 78.0–100.0)
Monocytes Absolute: 0.6 10*3/uL (ref 0.1–1.0)
Monocytes Relative: 8 % (ref 3–12)
RBC: 4.57 MIL/uL (ref 4.22–5.81)
RDW: 15.1 % (ref 11.5–15.5)
WBC: 6.8 10*3/uL (ref 4.0–10.5)

## 2012-11-27 LAB — COMPREHENSIVE METABOLIC PANEL
Alkaline Phosphatase: 114 U/L (ref 39–117)
BUN: 11 mg/dL (ref 6–23)
CO2: 24 mEq/L (ref 19–32)
Chloride: 105 mEq/L (ref 96–112)
Creatinine, Ser: 1.45 mg/dL — ABNORMAL HIGH (ref 0.50–1.35)
GFR calc non Af Amer: 53 mL/min — ABNORMAL LOW (ref >90)
Potassium: 3.7 mEq/L (ref 3.5–5.1)
Total Bilirubin: 0.2 mg/dL — ABNORMAL LOW (ref 0.3–1.2)

## 2012-11-27 LAB — URINALYSIS, DIPSTICK ONLY
Ketones, ur: NEGATIVE mg/dL
Leukocytes, UA: NEGATIVE
Nitrite: NEGATIVE
Specific Gravity, Urine: 1.025 (ref 1.005–1.030)
Urobilinogen, UA: 0.2 mg/dL (ref 0.0–1.0)
pH: 6 (ref 5.0–8.0)

## 2012-11-27 NOTE — Progress Notes (Signed)
Labs drawn today for cbc/diff,cea,cmp,ua dipstick

## 2012-11-28 ENCOUNTER — Other Ambulatory Visit (HOSPITAL_COMMUNITY): Payer: Managed Care, Other (non HMO)

## 2012-11-28 ENCOUNTER — Encounter (HOSPITAL_BASED_OUTPATIENT_CLINIC_OR_DEPARTMENT_OTHER): Payer: Managed Care, Other (non HMO)

## 2012-11-28 VITALS — BP 185/80 | HR 53 | Temp 97.8°F | Resp 16 | Wt 209.0 lb

## 2012-11-28 DIAGNOSIS — Z5112 Encounter for antineoplastic immunotherapy: Secondary | ICD-10-CM

## 2012-11-28 DIAGNOSIS — Z5111 Encounter for antineoplastic chemotherapy: Secondary | ICD-10-CM

## 2012-11-28 DIAGNOSIS — C20 Malignant neoplasm of rectum: Secondary | ICD-10-CM

## 2012-11-28 MED ORDER — SODIUM CHLORIDE 0.9 % IV SOLN
5.0000 mg/kg | Freq: Once | INTRAVENOUS | Status: AC
  Administered 2012-11-28: 475 mg via INTRAVENOUS
  Filled 2012-11-28: qty 19

## 2012-11-28 MED ORDER — SODIUM CHLORIDE 0.9 % IJ SOLN
10.0000 mL | INTRAMUSCULAR | Status: DC | PRN
  Administered 2012-11-28: 10 mL
  Filled 2012-11-28: qty 10

## 2012-11-28 MED ORDER — SODIUM CHLORIDE 0.9 % IJ SOLN
10.0000 mL | INTRAMUSCULAR | Status: DC | PRN
  Filled 2012-11-28: qty 10

## 2012-11-28 MED ORDER — SODIUM CHLORIDE 0.9 % IV SOLN
Freq: Once | INTRAVENOUS | Status: AC
  Administered 2012-11-28: 11:00:00 via INTRAVENOUS

## 2012-11-28 MED ORDER — SODIUM CHLORIDE 0.9 % IV SOLN
2400.0000 mg/m2 | INTRAVENOUS | Status: DC
  Administered 2012-11-28: 5300 mg via INTRAVENOUS
  Filled 2012-11-28: qty 106

## 2012-11-28 MED ORDER — LEUCOVORIN CALCIUM INJECTION 350 MG
400.0000 mg/m2 | Freq: Once | INTRAVENOUS | Status: AC
  Administered 2012-11-28: 880 mg via INTRAVENOUS
  Filled 2012-11-28: qty 44

## 2012-11-28 MED ORDER — FLUOROURACIL CHEMO INJECTION 2.5 GM/50ML
400.0000 mg/m2 | Freq: Once | INTRAVENOUS | Status: AC
  Administered 2012-11-28: 900 mg via INTRAVENOUS
  Filled 2012-11-28: qty 18

## 2012-11-28 MED ORDER — SODIUM CHLORIDE 0.9 % IV SOLN: Freq: Once | INTRAVENOUS | Status: DC

## 2012-11-28 NOTE — Progress Notes (Signed)
Tolerated chemotherapy well.  Home with continuous infusion pump, to return in 46 hrs for removal.

## 2012-11-30 ENCOUNTER — Encounter (HOSPITAL_BASED_OUTPATIENT_CLINIC_OR_DEPARTMENT_OTHER): Payer: Managed Care, Other (non HMO)

## 2012-11-30 ENCOUNTER — Ambulatory Visit (HOSPITAL_COMMUNITY): Payer: Managed Care, Other (non HMO) | Admitting: Oncology

## 2012-11-30 DIAGNOSIS — C20 Malignant neoplasm of rectum: Secondary | ICD-10-CM

## 2012-11-30 DIAGNOSIS — Z452 Encounter for adjustment and management of vascular access device: Secondary | ICD-10-CM

## 2012-11-30 MED ORDER — HEPARIN SOD (PORK) LOCK FLUSH 100 UNIT/ML IV SOLN
INTRAVENOUS | Status: AC
  Filled 2012-11-30: qty 5

## 2012-11-30 MED ORDER — HEPARIN SOD (PORK) LOCK FLUSH 100 UNIT/ML IV SOLN
500.0000 [IU] | Freq: Once | INTRAVENOUS | Status: AC | PRN
  Administered 2012-11-30: 500 [IU]
  Filled 2012-11-30: qty 5

## 2012-11-30 MED ORDER — SODIUM CHLORIDE 0.9 % IJ SOLN
10.0000 mL | INTRAMUSCULAR | Status: DC | PRN
  Administered 2012-11-30: 10 mL
  Filled 2012-11-30: qty 10

## 2012-11-30 NOTE — Progress Notes (Signed)
Martin Maldonado returns today for port de access and flush after 46 hr continous infusion of 41fu. Tolerated infusion without problems Portacath located rt chest wall deaccessed and Good blood return present. Portacath flushed with 20ml NS and 500U/50ml Heparin and needle removed intact. Procedure without incident. Patient tolerated procedure well.

## 2012-12-01 ENCOUNTER — Ambulatory Visit (HOSPITAL_COMMUNITY): Payer: Managed Care, Other (non HMO) | Admitting: Oncology

## 2012-12-11 ENCOUNTER — Encounter (HOSPITAL_COMMUNITY): Payer: Managed Care, Other (non HMO)

## 2012-12-11 ENCOUNTER — Other Ambulatory Visit (HOSPITAL_COMMUNITY): Payer: Self-pay | Admitting: Oncology

## 2012-12-11 DIAGNOSIS — C20 Malignant neoplasm of rectum: Secondary | ICD-10-CM

## 2012-12-11 DIAGNOSIS — E876 Hypokalemia: Secondary | ICD-10-CM

## 2012-12-11 LAB — CBC WITH DIFFERENTIAL/PLATELET
Basophils Absolute: 0 10*3/uL (ref 0.0–0.1)
Basophils Relative: 1 % (ref 0–1)
Eosinophils Absolute: 0.3 10*3/uL (ref 0.0–0.7)
HCT: 41 % (ref 39.0–52.0)
Hemoglobin: 13.6 g/dL (ref 13.0–17.0)
Lymphocytes Relative: 23 % (ref 12–46)
Lymphs Abs: 1.4 10*3/uL (ref 0.7–4.0)
MCHC: 33.2 g/dL (ref 30.0–36.0)
Monocytes Absolute: 0.5 10*3/uL (ref 0.1–1.0)
Monocytes Relative: 8 % (ref 3–12)
Neutro Abs: 4 10*3/uL (ref 1.7–7.7)
RBC: 4.68 MIL/uL (ref 4.22–5.81)
WBC: 6.3 10*3/uL (ref 4.0–10.5)

## 2012-12-11 LAB — COMPREHENSIVE METABOLIC PANEL
ALT: 16 U/L (ref 0–53)
AST: 17 U/L (ref 0–37)
Albumin: 3.7 g/dL (ref 3.5–5.2)
Calcium: 9.6 mg/dL (ref 8.4–10.5)
GFR calc Af Amer: 63 mL/min — ABNORMAL LOW (ref >90)
Sodium: 139 mEq/L (ref 135–145)
Total Protein: 7.1 g/dL (ref 6.0–8.3)

## 2012-12-11 LAB — URINALYSIS, DIPSTICK ONLY
Glucose, UA: NEGATIVE mg/dL
Ketones, ur: NEGATIVE mg/dL
Leukocytes, UA: NEGATIVE
Protein, ur: 30 mg/dL — AB
Specific Gravity, Urine: 1.02 (ref 1.005–1.030)
Urobilinogen, UA: 0.2 mg/dL (ref 0.0–1.0)

## 2012-12-11 MED ORDER — POTASSIUM CHLORIDE CRYS ER 20 MEQ PO TBCR: 20.0000 meq | EXTENDED_RELEASE_TABLET | Freq: Every day | ORAL | Status: DC

## 2012-12-11 NOTE — Progress Notes (Signed)
Labs drawn today for cbc/diff,cmp,cea,mg,urine dipstick

## 2012-12-12 ENCOUNTER — Encounter (HOSPITAL_BASED_OUTPATIENT_CLINIC_OR_DEPARTMENT_OTHER): Payer: Managed Care, Other (non HMO)

## 2012-12-12 ENCOUNTER — Other Ambulatory Visit (HOSPITAL_COMMUNITY): Payer: Managed Care, Other (non HMO)

## 2012-12-12 VITALS — BP 212/92 | HR 51 | Temp 98.1°F | Resp 16 | Wt 209.0 lb

## 2012-12-12 DIAGNOSIS — Z5111 Encounter for antineoplastic chemotherapy: Secondary | ICD-10-CM

## 2012-12-12 DIAGNOSIS — C20 Malignant neoplasm of rectum: Secondary | ICD-10-CM

## 2012-12-12 MED ORDER — SODIUM CHLORIDE 0.9 % IV SOLN
2400.0000 mg/m2 | INTRAVENOUS | Status: DC
  Administered 2012-12-12: 5300 mg via INTRAVENOUS
  Filled 2012-12-12: qty 106

## 2012-12-12 MED ORDER — SODIUM CHLORIDE 0.9 % IJ SOLN
10.0000 mL | INTRAMUSCULAR | Status: DC | PRN
  Administered 2012-12-12: 10 mL

## 2012-12-12 MED ORDER — FLUOROURACIL CHEMO INJECTION 2.5 GM/50ML
400.0000 mg/m2 | Freq: Once | INTRAVENOUS | Status: AC
  Administered 2012-12-12: 900 mg via INTRAVENOUS
  Filled 2012-12-12: qty 18

## 2012-12-12 MED ORDER — LEUCOVORIN CALCIUM INJECTION 350 MG
400.0000 mg/m2 | Freq: Once | INTRAVENOUS | Status: AC
  Administered 2012-12-12: 880 mg via INTRAVENOUS
  Filled 2012-12-12: qty 44

## 2012-12-12 MED ORDER — SODIUM CHLORIDE 0.9 % IV SOLN
Freq: Once | INTRAVENOUS | Status: AC
Start: 2012-12-12 — End: 2012-12-12
  Administered 2012-12-12: 10:00:00 via INTRAVENOUS

## 2012-12-12 MED ORDER — SODIUM CHLORIDE 0.9 % IV SOLN
5.0000 mg/kg | Freq: Once | INTRAVENOUS | Status: AC
  Administered 2012-12-12: 475 mg via INTRAVENOUS
  Filled 2012-12-12: qty 19

## 2012-12-12 NOTE — Progress Notes (Signed)
Tolerated chemo well.  Home with continuous infusion pump in place.

## 2012-12-14 ENCOUNTER — Encounter (HOSPITAL_BASED_OUTPATIENT_CLINIC_OR_DEPARTMENT_OTHER): Payer: Managed Care, Other (non HMO)

## 2012-12-14 DIAGNOSIS — Z452 Encounter for adjustment and management of vascular access device: Secondary | ICD-10-CM

## 2012-12-14 DIAGNOSIS — C20 Malignant neoplasm of rectum: Secondary | ICD-10-CM

## 2012-12-14 MED ORDER — SODIUM CHLORIDE 0.9 % IJ SOLN
10.0000 mL | INTRAMUSCULAR | Status: DC | PRN
  Administered 2012-12-14: 10 mL via INTRAVENOUS

## 2012-12-14 MED ORDER — HEPARIN SOD (PORK) LOCK FLUSH 100 UNIT/ML IV SOLN
INTRAVENOUS | Status: AC
  Filled 2012-12-14: qty 5

## 2012-12-14 MED ORDER — HEPARIN SOD (PORK) LOCK FLUSH 100 UNIT/ML IV SOLN
500.0000 [IU] | Freq: Once | INTRAVENOUS | Status: AC
  Administered 2012-12-14: 500 [IU] via INTRAVENOUS

## 2012-12-14 NOTE — Progress Notes (Signed)
Continuous iv infusion pump disconnected and port flushed as per clinic protocol. Tolerated well.

## 2012-12-25 ENCOUNTER — Ambulatory Visit (HOSPITAL_COMMUNITY): Payer: Managed Care, Other (non HMO) | Admitting: Oncology

## 2012-12-25 ENCOUNTER — Encounter (HOSPITAL_COMMUNITY): Payer: Managed Care, Other (non HMO)

## 2012-12-26 ENCOUNTER — Other Ambulatory Visit (HOSPITAL_COMMUNITY): Payer: Managed Care, Other (non HMO)

## 2012-12-26 ENCOUNTER — Inpatient Hospital Stay (HOSPITAL_COMMUNITY): Payer: Managed Care, Other (non HMO)

## 2012-12-28 ENCOUNTER — Encounter (HOSPITAL_COMMUNITY): Payer: Managed Care, Other (non HMO)

## 2013-01-25 ENCOUNTER — Other Ambulatory Visit: Payer: Self-pay

## 2013-02-02 ENCOUNTER — Telehealth (HOSPITAL_COMMUNITY): Payer: Self-pay

## 2013-02-02 NOTE — Telephone Encounter (Signed)
Message left for Martin Maldonado to call clinic with update and to let us know how he's doing.

## 2013-02-12 ENCOUNTER — Ambulatory Visit (INDEPENDENT_AMBULATORY_CARE_PROVIDER_SITE_OTHER): Payer: Medicare Other | Admitting: Physician Assistant

## 2013-02-12 ENCOUNTER — Encounter: Payer: Self-pay | Admitting: Physician Assistant

## 2013-02-12 VITALS — BP 205/104 | HR 56 | Ht 73.0 in | Wt 202.0 lb

## 2013-02-12 DIAGNOSIS — E785 Hyperlipidemia, unspecified: Secondary | ICD-10-CM

## 2013-02-12 DIAGNOSIS — I25810 Atherosclerosis of coronary artery bypass graft(s) without angina pectoris: Secondary | ICD-10-CM

## 2013-02-12 DIAGNOSIS — I10 Essential (primary) hypertension: Secondary | ICD-10-CM

## 2013-02-12 MED ORDER — AMLODIPINE BESYLATE 5 MG PO TABS: 5.0000 mg | ORAL_TABLET | Freq: Every day | ORAL | Status: DC

## 2013-02-12 NOTE — Progress Notes (Signed)
HPI:  This is a 57 year old male patient of Dr. Dietrich Pates who has history of coronary artery disease status post emergency CABG x2 in 2011. He also has hypertension hyperlipidemia. He is treated for metastatic rectal cancer. Most recent 2-D echo in 02/28/12 showed normal LV function ejection fraction 55% with mild LVH  Patient's main complaint today is high blood pressure and headaches. He says his blood pressures have always been high but his lisinopril was increased to 40 mg daily last week by his primary care because of elevated blood pressures. The patient says the only thing that relieves his headaches is BC powder. He does admit to eating a lot of salt. He eats out a lot and likes his country ham,sausage and biscuits.  He says he has a pinpoint area in his left chest that burns on occasion but it's been going on for over a year and is unchanged. He denies any exertional chest tightness, pressure, dyspnea, dyspnea on exertion, dizziness, or presyncope. He continues to get chemotherapy on a weekly basis but did take this week off with the holiday.   Allergies -- Erbitux [Cetuximab] -- Anaphylaxis  Current Outpatient Prescriptions on File Prior to Visit: aspirin 81 MG tablet, Take 81 mg by mouth daily.  , Disp: , Rfl:  clindamycin (CLINDAGEL) 1 % gel, Apply topically 2 (two) times daily., Disp: 30 g, Rfl: 4 dexamethasone (DECADRON) 4 MG tablet, Take 2 tablets PO starting the day after chemo for 2 days. Take with food. Take the day after the 87fu pump has been removed x 2 days, Disp: 30 tablet, Rfl: 1 hydrocortisone 1 % ointment, Apply topically 2 (two) times daily., Disp: 30 g, Rfl: 1 lisinopril (PRINIVIL,ZESTRIL) 20 MG tablet, Take 1 tablet (20 mg total) by mouth daily., Disp: 30 tablet, Rfl: 12 LORazepam (ATIVAN) 1 MG tablet, take 1 tablet every 4 hours if needed, Disp: 30 tablet, Rfl: 2 metoprolol (LOPRESSOR) 50 MG tablet, Take 1 tablet (50 mg total) by mouth 2 (two) times daily., Disp: 60 tablet,  Rfl: 11 multivitamin (THERAGRAN) per tablet, Take 1 tablet by mouth daily.  , Disp: , Rfl:  ondansetron (ZOFRAN) 8 MG tablet, take 1 tablet by mouth every 8 hours if needed for nausea, Disp: 30 tablet, Rfl: 2 potassium chloride SA (K-DUR,KLOR-CON) 20 MEQ tablet, Take 1 tablet (20 mEq total) by mouth daily., Disp: 30 tablet, Rfl: 0 promethazine (PHENERGAN) 25 MG tablet, Take 1 tablet (25 mg total) by mouth every 6 (six) hours as needed for nausea., Disp: 30 tablet, Rfl: 6 VIAGRA 50 MG tablet, take 1 tablet by mouth once daily if needed, Disp: 20 tablet, Rfl: 3  Current Facility-Administered Medications on File Prior to Visit: sodium chloride 0.9 % injection 10 mL, 10 mL, Intravenous, PRN, Ellouise Newer, PA-C, 10 mL at 09/19/12 1037    Past Medical History:   Hypertension                                                 Rectal cancer                                   2008           Comment:Adenocarcinoma; AP resection in 2008;  recurrence with lung metastases in 2011   Arteriosclerotic cardiovascular disease (ASCVD) 2011           Comment:CABG surgery in 2011   Hyperlipidemia                                               Tobacco abuse, in remission                     09/02/2009      Comment:50-pack-year consumption discontinued in 2011   Splenomegaly                                    01/23/2009      Comment:Additional GI history includes small bowel               obstruction, requiring laparotomy and lysis of               adhesions in 2009, remote peptic ulcer disease               and postoperative perirectal infection   ALCOHOL ABUSE, HX OF                            01/23/2009   Past Surgical History:   IRRIGATION AND DEBRIDEMENT SEBACEOUS CYST                     LAPAROSCOPIC LYSIS INTESTINAL ADHESIONS          2009           Comment:With incidental appendectomy   CORONARY ARTERY BYPASS GRAFT                     2011           Comment:2 vessel   ABDOMINOPERINEAL  PROCTOCOLECTOMY                 2008         PORTACATH PLACEMENT                              2011        Review of patient's family history indicates:   Cancer                         Mother                   Cancer                         Maternal Grandmother     Social History   Marital Status: Legally Separated   Spouse Name:                      Years of Education:                 Number of children:             Occupational History   None on file  Social History Main Topics   Smoking Status: Former Smoker  Packs/Day: 1.00  Years: 13        Quit date: 08/29/2009   Smokeless Status: Never Used                       Alcohol Use: No                Comment: History of excessive alcohol use-remote   Drug Use: No             Sexual Activity: Not on file        Other Topics            Concern   None on file  Social History Narrative   None on file    ROS: See history of present illness otherwise negative  PHYSICAL EXAM: Well-nournished, in no acute distress. Neck: No JVD, HJR, Bruit, or thyroid enlargement  Lungs: No tachypnea, clear without wheezing, rales, or rhonchi  Cardiovascular: RRR, PMI not displaced, heart sounds normal, no murmurs, gallops, bruit, thrill, or heave.  Abdomen: BS normal. Soft without organomegaly, masses, lesions or tenderness.  Extremities: without cyanosis, clubbing or edema. Good distal pulses bilateral  SKin: Warm, no lesions or rashes   Musculoskeletal: No deformities  Neuro: no focal signs  BP 205/104  Pulse 56  Ht 6\' 1"  (1.854 m)  Wt 202 lb (91.627 kg)  BMI 26.66 kg/m2     EKG: Sinus bradycardia 55 beats per minute with nonspecific ST-T wave abnormality laterally which is new  2-D echo 02/28/12: Study Conclusions  - Left ventricle: The cavity size was normal. Wall thickness   was increased in a pattern of mild LVH. LV false tendon   noted incidentally from region of posteromedialwall to   septum. The  estimated ejection fraction was 55%. Although   no diagnostic regional wall motion abnormality was   identified, this possibility cannot be completely excluded   on the basis of this study. The study is not technically   sufficient to allow evaluation of LV diastolic function. - Mitral valve: Trivial regurgitation. - Left atrium: The atrium was mildly dilated. - Tricuspid valve: Trivial regurgitation. - Pericardium, extracardiac: There was no pericardial   effusion.

## 2013-02-12 NOTE — Assessment & Plan Note (Signed)
Patient's blood pressure is extremely elevated. We'll add Norvasc 5 mg once daily. I had a long discussion with him about low sodium diet. We are giving him a 2 g sodium diet to follow. I think this will help bring his blood pressure down if he complies. He will come back next week for blood pressure check. I believe he'll need higher dose of Norvasc and possibly the addition of clonidine.

## 2013-02-12 NOTE — Patient Instructions (Signed)
Your physician recommends that you schedule a follow-up appointment in: 1 month with Dr Wyline Mood BP check next week  Your physician has recommended you make the following change in your medication:  1. Start Norvasc 5 mg daily  Start a low sodium diet.

## 2013-02-12 NOTE — Assessment & Plan Note (Signed)
Check fasting lipid panel 

## 2013-02-12 NOTE — Assessment & Plan Note (Addendum)
Patient had emergency CABG x2 in 2011. He has some atypical pinpoint burning in his chest on occasion that he's had for over a year. EKG does show some nonspecific changes the patient has no cardiac symptoms. Blood pressure has also been quite high. Will continue to monitor.

## 2013-02-19 ENCOUNTER — Ambulatory Visit (INDEPENDENT_AMBULATORY_CARE_PROVIDER_SITE_OTHER): Payer: Medicare Other | Admitting: *Deleted

## 2013-02-19 VITALS — BP 189/94 | HR 68

## 2013-02-19 DIAGNOSIS — I10 Essential (primary) hypertension: Secondary | ICD-10-CM

## 2013-02-19 LAB — LIPID PANEL: LDL Cholesterol: 186 mg/dL — ABNORMAL HIGH (ref 0–99)

## 2013-02-19 NOTE — Progress Notes (Signed)
Pt present for Blood pressure check. Pt advised that BP is still around 190s/90s . Pt did state that he was blowing leaves one day all day long and his BP was 140s/80s and two hours later went back up. BP today is 189/94 with heart rate of 68.  Pt took meds 45 min before coming into office. Please advise.

## 2013-02-19 NOTE — Patient Instructions (Signed)
Your physician recommends that you schedule a follow-up appointment in: TO BE DETERMINED   

## 2013-02-21 ENCOUNTER — Telehealth: Payer: Self-pay | Admitting: *Deleted

## 2013-02-21 NOTE — Telephone Encounter (Signed)
Sent another phone note stating that norvasc is making pt very tiered. Please advise.

## 2013-02-21 NOTE — Telephone Encounter (Signed)
Message copied by Thompson Grayer on Wed Feb 21, 2013  5:20 PM ------      Message from: Dyann Kief      Created: Wed Feb 21, 2013  3:05 PM                   ----- Message -----         From: Thompson Grayer, LPN         Sent: 02/19/2013  10:11 AM           To: Dyann Kief, PA-C             ------

## 2013-02-21 NOTE — Progress Notes (Signed)
Increase norvasc 10mg  daily. 2 gm sodium diet. bp check next week

## 2013-02-26 ENCOUNTER — Telehealth: Payer: Self-pay | Admitting: Adult Health

## 2013-02-26 MED ORDER — METOPROLOL TARTRATE 50 MG PO TABS: 50.0000 mg | ORAL_TABLET | Freq: Two times a day (BID) | ORAL | Status: DC

## 2013-02-26 NOTE — Telephone Encounter (Signed)
rx sent to pharmacy by e-script  

## 2013-02-26 NOTE — Telephone Encounter (Signed)
Received fax refill request  Rx # [id number] Medication:  Metoprolol Tartrate 50 mg tab Qty 60 Sig:  Take one tablet by mouth 2 times daily Physician:  Lyman Bishop

## 2013-02-28 MED ORDER — EZETIMIBE 10 MG PO TABS: 10.0000 mg | ORAL_TABLET | Freq: Every day | ORAL | Status: DC

## 2013-02-28 NOTE — Addendum Note (Signed)
Addended by: Thompson Grayer on: 02/28/2013 10:43 AM   Modules accepted: Orders

## 2013-03-01 ENCOUNTER — Ambulatory Visit (INDEPENDENT_AMBULATORY_CARE_PROVIDER_SITE_OTHER): Payer: Medicare Other | Admitting: *Deleted

## 2013-03-01 VITALS — BP 176/105 | HR 80

## 2013-03-01 DIAGNOSIS — I10 Essential (primary) hypertension: Secondary | ICD-10-CM

## 2013-03-01 MED ORDER — AMLODIPINE BESYLATE 10 MG PO TABS: 5.0000 mg | ORAL_TABLET | Freq: Every day | ORAL | Status: DC

## 2013-03-01 NOTE — Progress Notes (Signed)
Pt present for BP check. Pt BP is still high at 176/105 pulse is 80. Pt has had high BP for a while. Pt has been taking chemo for 8 years and has not took it for the past two months. Pt has been having symptoms of nausea, sweating, left arm hurting , then his head hurts for about a month now.. Pt takes BC powder about 2-3 times a day. Dr. Purvis Sheffield was in office during BP visit. Dr Purvis Sheffield changed his Norvasc from 5 mg to 10 mg. Please let me know if you want to change anything. Pt see Dr Wyline Mood next month. If you want to see him sooner please let me know.

## 2013-03-01 NOTE — Patient Instructions (Signed)
Your physician recommends that you schedule a follow-up appointment in: To Be determined  Your physician has recommended you make the following change in your medication:  1. Start Norvasc 10 mg daily

## 2013-03-02 ENCOUNTER — Telehealth: Payer: Self-pay | Admitting: Cardiology

## 2013-03-02 ENCOUNTER — Emergency Department (HOSPITAL_COMMUNITY)
Admission: EM | Admit: 2013-03-02 | Discharge: 2013-03-02 | Disposition: A | Discharge status: Home / Self Care | Payer: Medicare Other | Attending: Emergency Medicine | Admitting: Emergency Medicine

## 2013-03-02 ENCOUNTER — Emergency Department (HOSPITAL_COMMUNITY): Payer: Medicare Other

## 2013-03-02 ENCOUNTER — Encounter (HOSPITAL_COMMUNITY): Payer: Self-pay | Admitting: Emergency Medicine

## 2013-03-02 DIAGNOSIS — Z8719 Personal history of other diseases of the digestive system: Secondary | ICD-10-CM | POA: Insufficient documentation

## 2013-03-02 DIAGNOSIS — R51 Headache: Secondary | ICD-10-CM | POA: Insufficient documentation

## 2013-03-02 DIAGNOSIS — Z87891 Personal history of nicotine dependence: Secondary | ICD-10-CM | POA: Insufficient documentation

## 2013-03-02 DIAGNOSIS — IMO0002 Reserved for concepts with insufficient information to code with codable children: Secondary | ICD-10-CM | POA: Insufficient documentation

## 2013-03-02 DIAGNOSIS — Z951 Presence of aortocoronary bypass graft: Secondary | ICD-10-CM | POA: Insufficient documentation

## 2013-03-02 DIAGNOSIS — Z85528 Personal history of other malignant neoplasm of kidney: Secondary | ICD-10-CM | POA: Insufficient documentation

## 2013-03-02 DIAGNOSIS — Z8639 Personal history of other endocrine, nutritional and metabolic disease: Secondary | ICD-10-CM | POA: Insufficient documentation

## 2013-03-02 DIAGNOSIS — Z933 Colostomy status: Secondary | ICD-10-CM | POA: Insufficient documentation

## 2013-03-02 DIAGNOSIS — Z79899 Other long term (current) drug therapy: Secondary | ICD-10-CM | POA: Insufficient documentation

## 2013-03-02 DIAGNOSIS — Z862 Personal history of diseases of the blood and blood-forming organs and certain disorders involving the immune mechanism: Secondary | ICD-10-CM | POA: Insufficient documentation

## 2013-03-02 DIAGNOSIS — I10 Essential (primary) hypertension: Secondary | ICD-10-CM | POA: Insufficient documentation

## 2013-03-02 DIAGNOSIS — Z7982 Long term (current) use of aspirin: Secondary | ICD-10-CM | POA: Insufficient documentation

## 2013-03-02 LAB — SEDIMENTATION RATE: Sed Rate: 32 mm/hr — ABNORMAL HIGH (ref 0–16)

## 2013-03-02 LAB — COMPREHENSIVE METABOLIC PANEL
ALT: 10 U/L (ref 0–53)
Albumin: 3.7 g/dL (ref 3.5–5.2)
Alkaline Phosphatase: 97 U/L (ref 39–117)
BUN: 9 mg/dL (ref 6–23)
CO2: 24 mEq/L (ref 19–32)
Chloride: 99 mEq/L (ref 96–112)
Potassium: 3 mEq/L — ABNORMAL LOW (ref 3.5–5.1)
Sodium: 137 mEq/L (ref 135–145)
Total Bilirubin: 0.2 mg/dL — ABNORMAL LOW (ref 0.3–1.2)
Total Protein: 7.7 g/dL (ref 6.0–8.3)

## 2013-03-02 LAB — URINE MICROSCOPIC-ADD ON

## 2013-03-02 LAB — CBC WITH DIFFERENTIAL/PLATELET
Basophils Relative: 1 % (ref 0–1)
Eosinophils Absolute: 0.4 10*3/uL (ref 0.0–0.7)
HCT: 44.6 % (ref 39.0–52.0)
Hemoglobin: 15 g/dL (ref 13.0–17.0)
Lymphocytes Relative: 20 % (ref 12–46)
Lymphs Abs: 1.6 10*3/uL (ref 0.7–4.0)
MCHC: 33.6 g/dL (ref 30.0–36.0)
Monocytes Relative: 6 % (ref 3–12)
Neutro Abs: 5.6 10*3/uL (ref 1.7–7.7)
Neutrophils Relative %: 69 % (ref 43–77)
RBC: 5.14 MIL/uL (ref 4.22–5.81)
WBC: 8.1 10*3/uL (ref 4.0–10.5)

## 2013-03-02 LAB — TROPONIN I
Troponin I: <0.3 ng/mL (ref <0.30)
Troponin I: <0.3 ng/mL (ref <0.30)

## 2013-03-02 LAB — URINALYSIS, ROUTINE W REFLEX MICROSCOPIC
Bilirubin Urine: NEGATIVE
Glucose, UA: NEGATIVE mg/dL
Ketones, ur: NEGATIVE mg/dL
pH: 6 (ref 5.0–8.0)

## 2013-03-02 MED ORDER — MORPHINE SULFATE 4 MG/ML IJ SOLN: 4.0000 mg | Freq: Once | INTRAMUSCULAR | Status: DC

## 2013-03-02 MED ORDER — TETRACAINE HCL 0.5 % OP SOLN
2.0000 [drp] | Freq: Once | OPHTHALMIC | Status: AC
  Administered 2013-03-02: 2 [drp] via OPHTHALMIC
  Filled 2013-03-02: qty 2

## 2013-03-02 MED ORDER — ONDANSETRON HCL 4 MG/2ML IJ SOLN
4.0000 mg | Freq: Once | INTRAMUSCULAR | Status: AC
  Administered 2013-03-02: 4 mg via INTRAVENOUS

## 2013-03-02 MED ORDER — IOHEXOL 350 MG/ML SOLN
100.0000 mL | Freq: Once | INTRAVENOUS | Status: AC | PRN
  Administered 2013-03-02: 100 mL via INTRAVENOUS

## 2013-03-02 MED ORDER — METOPROLOL TARTRATE 1 MG/ML IV SOLN
5.0000 mg | Freq: Once | INTRAVENOUS | Status: AC
  Administered 2013-03-02: 5 mg via INTRAVENOUS
  Filled 2013-03-02: qty 5

## 2013-03-02 MED ORDER — POTASSIUM CHLORIDE ER 10 MEQ PO TBCR: 10.0000 meq | EXTENDED_RELEASE_TABLET | Freq: Every day | ORAL | Status: DC

## 2013-03-02 MED ORDER — PROMETHAZINE HCL 25 MG/ML IJ SOLN
25.0000 mg | Freq: Once | INTRAMUSCULAR | Status: AC
  Administered 2013-03-02: 25 mg via INTRAVENOUS
  Filled 2013-03-02: qty 1

## 2013-03-02 MED ORDER — ONDANSETRON HCL 4 MG/2ML IJ SOLN
4.0000 mg | Freq: Once | INTRAMUSCULAR | Status: DC
  Filled 2013-03-02: qty 2

## 2013-03-02 MED ORDER — DIPHENHYDRAMINE HCL 50 MG/ML IJ SOLN
25.0000 mg | Freq: Once | INTRAMUSCULAR | Status: AC
  Administered 2013-03-02: 25 mg via INTRAVENOUS
  Filled 2013-03-02: qty 1

## 2013-03-02 MED ORDER — POTASSIUM CHLORIDE CRYS ER 20 MEQ PO TBCR
40.0000 meq | EXTENDED_RELEASE_TABLET | Freq: Once | ORAL | Status: AC
  Administered 2013-03-02: 40 meq via ORAL
  Filled 2013-03-02: qty 2

## 2013-03-02 MED ORDER — METOCLOPRAMIDE HCL 5 MG/ML IJ SOLN
10.0000 mg | Freq: Once | INTRAMUSCULAR | Status: AC
  Administered 2013-03-02: 10 mg via INTRAVENOUS
  Filled 2013-03-02: qty 2

## 2013-03-02 MED ORDER — KETOROLAC TROMETHAMINE 30 MG/ML IJ SOLN
30.0000 mg | Freq: Once | INTRAMUSCULAR | Status: AC
  Administered 2013-03-02: 30 mg via INTRAVENOUS
  Filled 2013-03-02: qty 1

## 2013-03-02 NOTE — Telephone Encounter (Signed)
I received a phone call from Dr. Manus Gunning at the [facility] ED regarding Martin Maldonado. Patient presented with significantly elevated blood pressure, headache and atypical neck and thoracic discomfort which has been an ongoing issue for the last few months. I reviewed his records. He is a former patient Dr. [name] and has had followup with Ms. Martin Maldonado and also Ms. [name]. Just had a blood pressure check in the office yesterday, results reviewed with Dr. [name] who recommended an increase in Norvasc from 5 mg 10 mg daily. Patient was given dose of IV Lopressor in the ER with improvement in blood pressure and symptoms. He will likely be discharged home today. I note that the patient is scheduled to see Dr. [name] in January.. I have asked the office staff to make a closer followup visit for next week with Dr. [name] to ensure that the blood pressure trend continues to improve. Also increasing Lopressor to 50 mg 3 times a day, continue current doses of lisinopril and Norvasc.

## 2013-03-02 NOTE — ED Provider Notes (Signed)
CSN: [id number]     Arrival date & time 03/02/13  1235 History  This chart was scribed for Martin Octave, MD by Ellin Mayhew, ED Scribe and Bennett Scrape, ED Scribe. This patient was seen in room APA07/APA07 and the patient's care was started at 12:55PM.  Chief Complaint  Patient presents with  . Hypertension   The history is provided by the patient and a relative. No language interpreter was used.   HPI Comments: Martin Maldonado is a 57 y.o. male who presents to the Emergency Department with hypertension He had his BP checked yesterday and was referred to the ED after a high reading. BP is currently 175/115. Patient reports BP typically runs in 200/100 range. He has followed up with his PCP and cardiologist. His amlodipine was doubled from 5 to 10 mg and his lisinopril was doubled from 20 to 40 mg two weeks ago. Patient denies any missed doses. Patient has also had HAs, which he feels are related to the HTN, that have been present for 2 months. It is localized to the left temple and radiates to the neck and L shoulder. Hot showers temporarily relieves the L shoulder pain. He has associated nausea and vomiting. He states the HA occur 3 times per day and mostly at night. He reports intermittent blurry vision. The pain is present upon waking. He does not currently have a HA. He does have dizziness and light headedness. His relative states that the patient has been having cold sweats that precede the HAs. He denies any Viagra use. Patient has had surgery recently. He does not have history of MI and does not have any stents. He has not had any numbness or SOB, back pain.   Patient has also had a constant,  burning CP that does not radiate that began two motnhs ago. He says he has a pinpoint area in his left chest that burns on occasion but it's been going on for over a year and is unchanged. He denies any exertional chest tightness, pressure, dyspnea, dyspnea on exertion, dizziness, or presyncope.   Patient states that when he burps, it "tastes like burned toast". He denies having CP with exertion.  Patient has a history of colon cancer that has spread tot the lungs and has had chemotherapy; however, has been off of chemotherapy for two months. He denies receiving any radiation therapy for his cancer currently.   Dr. Sherryll Burger is his PCP.  Past Medical History  Diagnosis Date  . Hypertension   . Rectal cancer 2008    Adenocarcinoma; AP resection in 2008; recurrence with lung metastases in 2011  . Arteriosclerotic cardiovascular disease (ASCVD) 2011    CABG surgery in 2011  . Hyperlipidemia   . 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 Splenomegaly 01/23/2009    Additional GI history includes small bowel obstruction, requiring laparotomy and lysis of adhesions in 2009, remote peptic ulcer disease and postoperative perirectal infection  . ALCOHOL ABUSE, HX OF 01/23/2009   Past Surgical History  Procedure Laterality Date  . Irrigation and debridement sebaceous cyst    . Laparoscopic lysis intestinal adhesions  2009    With incidental appendectomy  . Coronary artery bypass graft  2011    2 vessel  . Abdominoperineal proctocolectomy  2008  . Portacath placement  2011  . Colostomy     Family History  Problem Relation Age of Onset  . Cancer Mother   . Cancer Maternal Grandmother  History  Substance Use Topics  . Smoking status: Former Smoker -- 1.00 packs/day for 50 years    Quit date: 08/29/2009  . Smokeless tobacco: Never Used  . Alcohol Use: No     Comment: History of excessive alcohol use-remote    Review of Systems  A complete 10 system review of systems was obtained and all systems are negative except as noted in the HPI and PMH.   Allergies  Erbitux  Home Medications   Current Outpatient Rx  Name  Route  Sig  Dispense  Refill  . amLODipine (NORVASC) 10 MG tablet   Oral   Take 0.5 tablets (5 mg total) by mouth  daily.   30 tablet   6   . aspirin 81 MG tablet   Oral   Take 81 mg by mouth daily.           . Aspirin-Salicylamide-Caffeine (BC HEADACHE PO)   Oral   Take 2 packets by mouth 4 (four) times daily as needed (headache).         . lisinopril (PRINIVIL,ZESTRIL) 20 MG tablet   Oral   Take 40 mg by mouth daily.         . metoprolol (LOPRESSOR) 50 MG tablet   Oral   Take 1 tablet (50 mg total) by mouth 2 (two) times daily.   60 tablet   6   . multivitamin (THERAGRAN) per tablet   Oral   Take 1 tablet by mouth daily.           . promethazine (PHENERGAN) 25 MG tablet   Oral   Take 1 tablet (25 mg total) by mouth every 6 (six) hours as needed for nausea.   30 tablet   6   . VIAGRA 50 MG tablet      take 1 tablet by mouth once daily if needed   20 tablet   3   . dexamethasone (DECADRON) 4 MG tablet      Take 2 tablets PO starting the day after chemo for 2 days. Take with food. Take the day after the 53fu pump has been removed x 2 days   30 tablet   1   . ezetimibe (ZETIA) 10 MG tablet   Oral   Take 1 tablet (10 mg total) by mouth daily.   30 tablet   6   . potassium chloride (K-DUR) 10 MEQ tablet   Oral   Take 1 tablet (10 mEq total) by mouth daily.   5 tablet   0    Triage Vitals: BP 175/115  Pulse 90  Temp(Src) 98 F (36.7 C) (Oral)  Resp 20  Ht 6\' 1"  (1.854 m)  Wt 196 lb (88.905 kg)  BMI 25.86 kg/m2  SpO2 98%  Physical Exam  Nursing note and vitals reviewed. Constitutional: He is oriented to person, place, and time. He appears well-developed and well-nourished. No distress.  HENT:  Head: Normocephalic and atraumatic.  No temple artery tenderness.  Eyes: EOM are normal. Pupils are equal, round, and reactive to light.  IOP OS 14 IOP OD 15  Neck: Normal range of motion. Neck supple.  No meningismus  Cardiovascular: Normal rate, regular rhythm and normal heart sounds.   Pulmonary/Chest: Effort normal. No respiratory distress.  R chest port   Abdominal: Soft. There is no tenderness.  LLQ colostomy  Musculoskeletal: Normal range of motion.  Intact peripheral pulses, no peripheral edema  Neurological: He is alert and oriented to person, place, and time.  No cranial nerve deficit.  CN 2-12 intact, no ataxia on finger to nose, no nystagmus, 5/5 strength throughout, no pronator drift, Romberg negative, normal gait.  Skin: Skin is warm and dry.  Psychiatric: He has a normal mood and affect. His behavior is normal.    ED Course  Procedures (including critical care time)  DIAGNOSTIC STUDIES: Oxygen Saturation is 98on room air, normal by my interpretation.    COORDINATION OF CARE: 1:04 PM-Treatment plan discussed with patient and patient agrees.  2:17 PM-Cardiology appointment scheduled for December the 17th at 9:20AM with [facility]. Advises to increase metoprolol to 3 times daily.  3:21 PM-Pt rechecked and reports a stabbing HA similar to what he has been experiencing. He denies a sudden onset and denies blurry vision. Informed pt of consult with Cards and plan to order another cardiac test. Pt is agreeable.  Labs Review Labs Reviewed  COMPREHENSIVE METABOLIC PANEL - Abnormal; Notable for the following:    Potassium 3.0 (*)    Glucose, Bld 103 (*)    Total Bilirubin 0.2 (*)    GFR calc non Af Amer 69 (*)    GFR calc Af Amer 80 (*)    All other components within normal limits  URINALYSIS, ROUTINE W REFLEX MICROSCOPIC - Abnormal; Notable for the following:    Protein, ur 30 (*)    All other components within normal limits  SEDIMENTATION RATE - Abnormal; Notable for the following:    Sed Rate 32 (*)    All other components within normal limits  CBC WITH DIFFERENTIAL  TROPONIN I  TROPONIN I  URINE MICROSCOPIC-ADD ON   Imaging Review Ct Head Wo Contrast  03/02/2013   CLINICAL DATA:  Headache and dizziness ; hypertension ; history of colon carcinoma  EXAM: CT HEAD WITHOUT CONTRAST  TECHNIQUE: Contiguous axial images were  obtained from the base of the skull through the vertex without intravenous contrast. Study was obtained within 24 hr of patient's arrival at the emergency department.  COMPARISON:  None.  FINDINGS: Ventricles are normal in size and configuration. There is no mass, hemorrhage, extra-axial fluid collection, or midline shift. Gray-white compartments are normal. There is no demonstrable acute infarct. Bony calvarium appears intact. The mastoid air cells are clear.  IMPRESSION: Study within normal limits.   Electronically Signed   By: Bretta Bang M.D.   On: 03/02/2013 14:36   Ct Angio Chest Aorta W/cm &/or Wo/cm  03/02/2013   CLINICAL DATA:  Chest pain, hypertension  EXAM: CT ANGIOGRAPHY CHEST WITH CONTRAST  TECHNIQUE: Multidetector CT imaging of the chest was performed using the standard protocol during bolus administration of intravenous contrast. Multiplanar CT image reconstructions including MIPs were obtained to evaluate the vascular anatomy.  CONTRAST:  OMNIPAQUE IOHEXOL 350 MG/ML SOLN  COMPARISON:  10/17/2012, 07/14/2012  FINDINGS: The thoracic inlet is unremarkable.  There is no evidence of filling defects within the main, lobar, and segmental pulmonary arteries. Multiple pulmonary nodules and subpleural masses are identified within the right left hemithoraces. When compared to the previous study there is increase disease ball. Representative large simple mass identified in the medial basal segment left lower lobe image 67 series 6 measuring 4.5 by 3.4 cm previous measurements are 4.3 by 3.1 cm. Edit subpleural mass identified within the medial basal segment left lower lobe image 59 series 6 measuring 4.2 x 3.4 cm previously measuring 3.4 x 2.8 cm. There does not appear to be evidence of focal infiltrates. Mediastinal and hilar adenopathy is appreciated which  is also increased in bulk. A prevascular lymph node identified image 40 series 5 measuring 1.7 cm in short axis. There is increased bulk in  the right and left hilar adenopathy.  Visualized upper abdominal viscera demonstrate vague low attenuating area along the posterior lateral dome of the liver image 101 series 5 measuring 9 mm.  There is no evidence of a thoracic aortic aneurysm nor dissection.  Review of the MIP images confirms the above findings.  IMPRESSION: 1. No CT evidence of pulmonary arterial embolic disease. 2. Increased bulk of the metastatic disease within the lungs and mediastinum. 3. Stable low attenuating nodule within the liver.   Electronically Signed   By: Salome Holmes M.D.   On: 03/02/2013 14:45    EKG Interpretation    Date/Time:  Friday March 02 2013 15:02:56 EST Ventricular Rate:  74 PR Interval:  154 QRS Duration: 86 QT Interval:  392 QTC Calculation: 435 R Axis:   79 Text Interpretation:  Normal sinus rhythm Normal ECG When compared with ECG of 02-Mar-2013 12:47, No significant change was found No significant change was found Confirmed by Manus Gunning  MD, Tarren Sabree ([id number]) on 03/02/2013 3:14:52 PM            MDM   1. Hypertension   2. Headache    Two-month history of blood pressure elevation, headaches and nausea. Recent medication changes by his PCP and cardiologist. Also complains of burning in the chest that has been constant for several months. No exertional chest pain, shortness of breath or diaphoresis. Headaches are L sided and gradual in onset.  Denies thunderclap onset or vision change.  Chest pain is atypical for ACS. Is not similar to his previous angina type pain. Denies any pain exertion. He has been constant and daily for months.  Labs appear to be stable. No evidence of endorgan damage. Patient given 5 mg of metoprolol and blood pressure improved to 176/90. Discussed with cardiology Dr. Diona Browner. He reviewed patient's records and previous office visit. Recommend increase metoprolol to 50 mg 3 times a day. He will arrange close followup this week. Next step would be to add  hydralazine. He agrees that the burning in patient's chest is atypical for ACS or angina as been ongoing for the past one year. EKG is unchanged. Troponin is negative.  Dr. Diona Browner has scheduled patient for followup on December 17 at 9:20 AM. Family and patient informed. We'll obtain a second troponin.  Second troponin negative. Patient developed gradual onset headache while in ED. Improved with migraine cocktail. CT head negative. BP 170s/90s. IOP normal.  No temporal artery tenderness, ESR <50.  Doubt SAH, meningitis, temporal arteritis.  BP 179/84  Pulse 69  Temp(Src) 98.5 F (36.9 C) (Oral)  Resp 20  Ht 6\' 1"  (1.854 m)  Wt 196 lb (88.905 kg)  BMI 25.86 kg/m2  SpO2 99%   I personally performed the services described in this documentation, which was scribed in my presence. The recorded information has been reviewed and is accurate.   Martin Octave, MD 03/02/13 [phone]

## 2013-03-02 NOTE — ED Notes (Signed)
Called lab to check on SEd rate, lab states will be one more hour.

## 2013-03-02 NOTE — ED Notes (Signed)
Metoprolol given over 3 min after 1345 vitals recorded.

## 2013-03-02 NOTE — ED Notes (Signed)
Pt alert & oriented x4, stable gait. Patient given discharge instructions, paperwork & prescription(s). Patient  instructed to stop at the registration desk to finish any additional paperwork. Patient verbalized understanding. Pt left department w/ no further questions. 

## 2013-03-02 NOTE — ED Notes (Signed)
Appt with Cardiology for 03-07-13 at 0920 given by Terri.  Nurse and EDP informed.

## 2013-03-02 NOTE — ED Notes (Signed)
Pain lt side of chest, lt neck and shoulder    Headache, nausea.  ,  Has had  This pain for several mos.  Pt has a portacath and being treated for colon cancer.  Has been being treated for colon cancer 8 years. And has cancer in his lungs.   No chemo for 2 mos.

## 2013-03-07 ENCOUNTER — Encounter: Payer: Self-pay | Admitting: Cardiology

## 2013-03-07 ENCOUNTER — Ambulatory Visit (INDEPENDENT_AMBULATORY_CARE_PROVIDER_SITE_OTHER): Payer: Medicare Other | Admitting: Cardiology

## 2013-03-07 VITALS — BP 184/98 | HR 75 | Ht 73.0 in | Wt 195.0 lb

## 2013-03-07 DIAGNOSIS — Z8719 Personal history of other diseases of the digestive system: Secondary | ICD-10-CM

## 2013-03-07 DIAGNOSIS — G893 Neoplasm related pain (acute) (chronic): Secondary | ICD-10-CM

## 2013-03-07 DIAGNOSIS — R161 Splenomegaly, not elsewhere classified: Secondary | ICD-10-CM

## 2013-03-07 DIAGNOSIS — J4489 Other specified chronic obstructive pulmonary disease: Secondary | ICD-10-CM

## 2013-03-07 DIAGNOSIS — J449 Chronic obstructive pulmonary disease, unspecified: Secondary | ICD-10-CM

## 2013-03-07 DIAGNOSIS — E785 Hyperlipidemia, unspecified: Secondary | ICD-10-CM

## 2013-03-07 DIAGNOSIS — R51 Headache: Secondary | ICD-10-CM

## 2013-03-07 DIAGNOSIS — C20 Malignant neoplasm of rectum: Secondary | ICD-10-CM

## 2013-03-07 DIAGNOSIS — I25810 Atherosclerosis of coronary artery bypass graft(s) without angina pectoris: Secondary | ICD-10-CM

## 2013-03-07 DIAGNOSIS — R519 Headache, unspecified: Secondary | ICD-10-CM

## 2013-03-07 DIAGNOSIS — I10 Essential (primary) hypertension: Secondary | ICD-10-CM

## 2013-03-07 MED ORDER — PANTOPRAZOLE SODIUM 20 MG PO TBEC: 20.0000 mg | DELAYED_RELEASE_TABLET | Freq: Every day | ORAL | Status: DC

## 2013-03-07 MED ORDER — TRIAMTERENE-HCTZ 37.5-25 MG PO TABS: 1.0000 | ORAL_TABLET | Freq: Every day | ORAL | Status: DC

## 2013-03-07 NOTE — Patient Instructions (Addendum)
Your physician recommends that you schedule a follow-up appointment in: 2-3 WEEKS  Your physician recommends that you return for lab work in: TODAY (SLIPS GIVEN FOR TSH,RENIN,BMET,SERUM/METANEPHARONES) IN ORDER TO HAVE THESE LABS YOU NEED TO BE FASTING AFTER MIDNIGHT, NOTHING TO EAT OF DRINK EXCEPT FOR SIPS OF WATER TO DRINK WITH YOUR MEDICATIONS, REFRAIN FROM USING CAFFEINE, TOBACCO,ALCOHOL OR TEA FOR FOR 24 HOURS PRIOR TO LAB DRAW AS WELL AS AVOID HEAVY EXERCISE FOR 24 HOURS  WE WILL CALL YOU WITH YOUR TEST RESULTS/INSTRUCTIONS/NEXT STEPS ONCE RECEIVED BY THE PROVIDER    Your physician has recommended you make the following change in your medication:   1) START PROTON IX 20MG  ONCE DAILY 2) START MAXZIDE 37.5/25MG  ONCE DAILY   You have been referred to NEUROLOGY A staff member from our office will alert you the with appointment date and time, once available

## 2013-03-07 NOTE — Progress Notes (Signed)
Clinical Summary Martin Maldonado is a 57 y.o.male former patient of Dr Dietrich Pates, this is our first visit together. He was seen for the following medical problems.  1. CAD - prior CABG in 2011 2 vessel (LIMA to LAD and SVG to OM in the setting of unstable angina. LVEF 55% by LV gram 02/2012 -decribes small area of burning chest pain in mid to left chest, nothing makes better or worst. No other associated symptoms.  Can last several hours, no associated symptoms. Can occur at rest or with exertion.    2. HTN -recent ER visit for HTN and headache, bp found to be 175/115 - recently lisinopril was increased to 40mg  daily and norvasc increased to 10 mg daily. At recent ER visit metoprolol was increased to 50mg  tid.   Intermittent episodes of predominately left sided headache,  blurry vision, headaches - in ER CT head negative, CTA chest with no dissection but increased bulk of metastatic disease. EKG without ischemia and negative troponin.  - headaches started 1 month ago. Denies any significant prior history of headaches. Occur 2-3 times a day. Gets sweaty, nauseous, with dry heaving and light headedness. Taking heavy amounts of BC powder to help with symptoms.  - Denies EtoH. Told he snores at night, no apneic episodes. Tired often during the day times though reports it more related to  medicaitons.   3. Rectal CA - followed by oncology, metastatic with lung mets - not on chemo currently    Past Medical History  Diagnosis Date  . Hypertension   . Rectal cancer 2008    Adenocarcinoma; AP resection in 2008; recurrence with lung metastases in 2011  . Arteriosclerotic cardiovascular disease (ASCVD) 2011    CABG surgery in 2011  . Hyperlipidemia   . 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 Splenomegaly 01/23/2009    Additional GI history includes small bowel obstruction, requiring laparotomy and lysis of adhesions in 2009, remote peptic ulcer  disease and postoperative perirectal infection  . ALCOHOL ABUSE, HX OF 01/23/2009     Allergies  Allergen Reactions  . Erbitux [Cetuximab] Anaphylaxis     Current Outpatient Prescriptions  Medication Sig Dispense Refill  . amLODipine (NORVASC) 10 MG tablet Take 0.5 tablets (5 mg total) by mouth daily.  30 tablet  6  . aspirin 81 MG tablet Take 81 mg by mouth daily.        . Aspirin-Salicylamide-Caffeine (BC HEADACHE PO) Take 2 packets by mouth 4 (four) times daily as needed (headache).      Marland Kitchen dexamethasone (DECADRON) 4 MG tablet Take 2 tablets PO starting the day after chemo for 2 days. Take with food. Take the day after the 25fu pump has been removed x 2 days  30 tablet  1  . ezetimibe (ZETIA) 10 MG tablet Take 1 tablet (10 mg total) by mouth daily.  30 tablet  6  . lisinopril (PRINIVIL,ZESTRIL) 20 MG tablet Take 40 mg by mouth daily.      . metoprolol (LOPRESSOR) 50 MG tablet Take 1 tablet (50 mg total) by mouth 2 (two) times daily.  60 tablet  6  . multivitamin (THERAGRAN) per tablet Take 1 tablet by mouth daily.        . potassium chloride (K-DUR) 10 MEQ tablet Take 1 tablet (10 mEq total) by mouth daily.  5 tablet  0  . promethazine (PHENERGAN) 25 MG tablet Take 1 tablet (25 mg total) by  mouth every 6 (six) hours as needed for nausea.  30 tablet  6  . VIAGRA 50 MG tablet take 1 tablet by mouth once daily if needed  20 tablet  3   No current facility-administered medications for this visit.   Facility-Administered Medications Ordered in Other Visits  Medication Dose Route Frequency Provider Last Rate Last Dose  . sodium chloride 0.9 % injection 10 mL  10 mL Intravenous PRN Ellouise Newer, PA-C   10 mL at 09/19/12 1037     Past Surgical History  Procedure Laterality Date  . Irrigation and debridement sebaceous cyst    . Laparoscopic lysis intestinal adhesions  2009    With incidental appendectomy  . Coronary artery bypass graft  2011    2 vessel  . Abdominoperineal  proctocolectomy  2008  . Portacath placement  2011  . Colostomy       Allergies  Allergen Reactions  . Erbitux [Cetuximab] Anaphylaxis      Family History  Problem Relation Age of Onset  . Cancer Mother   . Cancer Maternal Grandmother      Social History Martin Maldonado reports that he quit smoking about 3 years ago. He has never used smokeless tobacco. Martin Maldonado reports that he does not drink alcohol.   Review of Systems CONSTITUTIONAL: No weight loss, fever, chills, weakness or fatigue.  HEENT: Eyes: No visual loss, blurred vision, double vision or yellow sclerae.No hearing loss, sneezing, congestion, runny nose or sore throat.  SKIN: No rash or itching.  CARDIOVASCULAR: per HPI RESPIRATORY: No shortness of breath, cough or sputum.  GASTROINTESTINAL: No anorexia, nausea, vomiting or diarrhea. No abdominal pain or blood.  GENITOURINARY: No burning on urination, no polyuria NEUROLOGICAL: headaches MUSCULOSKELETAL: No muscle, back pain, joint pain or stiffness.  LYMPHATICS: No enlarged nodes. No history of splenectomy.  PSYCHIATRIC: No history of depression or anxiety.  ENDOCRINOLOGIC: No reports of sweating, cold or heat intolerance. No polyuria or polydipsia.  Marland Kitchen   Physical Examination p 75 bp 194/98 Wt 195 lbs BMI 26 Gen: resting comfortably, no acute distress HEENT: no scleral icterus, pupils equal round and reactive, no palptable cervical adenopathy,  CV: RRR, no m/r/g, no JVD, no carotid bruits Resp: Clear to auscultation bilaterally GI: abdomen is soft, non-tender, non-distended, normal bowel sounds, no hepatosplenomegaly MSK: extremities are warm, no edema.  Skin: warm, no rash Neuro:  no focal deficits Psych: appropriate affect   Diagnostic Studies Cath 05/2009 RESULTS:  Hemodynamics:  LV 166/16, AO 145/80.  Coronaries:  On engaging the left main, there was initial damping of the pressure waveform.  All shots were taken as flush injections with adequate  opacification.  He had an ostial long 99% left main stenosis. LAD wrapped the apex.  There was proximal 25% stenosis.  There were mid luminal irregularities.  There were small diagonals.  The circumflex in the AV groove had luminal irregularities.  There was a small OM1, which was normal.  Posterolateral was moderate size and normal.  Right coronary artery:  The right coronary artery was dominant vessel.  There was some proximal 25% stenosis.  It was otherwise normal throughout its course.  The PDA was moderate size and normal.  Left ventriculogram: Left ventriculogram was obtained in the RAO projection.  The EF was 65% with normal wall motion.  CONCLUSION:  The patient has critical left main stenosis.  He has got well-preserved ejection fraction.  PLAN:  The patient will be taken to the OR today by  Dr. Dorris Fetch.  Echo 02/2012 LVEF 55%, cannot evaluate diastolic function,   03/02/13 Na 137 K 3.0 Cr 1.16 BUN 9  Assessment and Plan  1. CAD/Chest pain - atypical chest pain, burning like sensation in mid chest. Potential GI etiology, he is taking a lot of NSAIDs for his headaches. - start PPI  2.Headaches/HTN - episodic severe HTN resistant to medications combined with intermittent headaches. 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 Will order plasma metanephrines and catecholamine - patient hypertensive and hypokalemic, worrisome for possible hyperaldo. Will check renin/aldo levels - in setting of severe HTN also check TSH. Pending this initial workup he may need a renal artery ultrasound and potentially a sleep study - will add maxide for blood pressure, continue K replacement and check BMET this week.  - refer to neurology for headaches  F/u 2-3 weeks      Antoine Poche, M.D., F.A.C.C.

## 2013-03-20 ENCOUNTER — Telehealth: Payer: Self-pay | Admitting: *Deleted

## 2013-03-20 NOTE — Telephone Encounter (Signed)
Thank you for update, I see that he has not had his labs done either. Please continue to try and contact him.  Dina Rich MD

## 2013-03-20 NOTE — Telephone Encounter (Signed)
FYI: Received the following notation from [facility] Neurology rep:  Messages left for the pt to call for an appt on 03/07/13, 03/12/13, and 03/19/13. To date pt has not called our office for an appt. We will be glad to sch if the pt does contact us for an appt. Referral currently marked as unable to contact. Thank you-Sherri   This nurse left a message on pt home number to get an update per pt not available, nurse HD LPN made aware verbally per this nurse will be out of the office until 03-26-2013

## 2013-03-26 ENCOUNTER — Ambulatory Visit: Payer: Medicare Other | Admitting: Cardiology

## 2013-03-26 NOTE — Telephone Encounter (Signed)
.  left message to have patient return my call.  

## 2013-03-28 NOTE — Telephone Encounter (Signed)
.  left message to have patient return my call.  

## 2013-04-03 ENCOUNTER — Encounter: Payer: Self-pay | Admitting: *Deleted

## 2013-04-03 NOTE — Telephone Encounter (Signed)
Several attempts made to contact pt via phone, letter mailed to pt to advise need to call neurology and or our office to schedule apt

## 2013-04-03 NOTE — Telephone Encounter (Signed)
Thank you for working to contact him   Carlyle Dolly MD

## 2013-04-06 ENCOUNTER — Encounter: Payer: Self-pay | Admitting: *Deleted

## 2013-04-06 ENCOUNTER — Other Ambulatory Visit: Payer: Self-pay | Admitting: *Deleted

## 2013-04-06 DIAGNOSIS — I25810 Atherosclerosis of coronary artery bypass graft(s) without angina pectoris: Secondary | ICD-10-CM

## 2013-04-06 DIAGNOSIS — I10 Essential (primary) hypertension: Secondary | ICD-10-CM

## 2013-04-09 ENCOUNTER — Encounter: Payer: Medicare Other | Admitting: Cardiology

## 2013-04-09 ENCOUNTER — Encounter: Payer: Self-pay | Admitting: Cardiology

## 2013-04-09 NOTE — Progress Notes (Signed)
Clinical Summary Martin Maldonado is a 58 y.o.male seen today for follow up of the following medical problems.   1. CAD  - prior CABG in 2011 2 vessel (LIMA to LAD and SVG to OM in the setting of unstable angina. LVEF 55% by LV gram 02/2012  -decribes small area of burning chest pain in mid to left chest, nothing makes better or worst. No other associated symptoms. Can last several hours, no associated symptoms. Can occur at rest or with exertion.   2. HTN  -recent ER visit for HTN and headache, bp found to be 175/115  - recently lisinopril was increased to 40mg  daily and norvasc increased to 10 mg daily. At recent ER visit metoprolol was increased to 50mg  tid. Intermittent episodes of predominately left sided headache, blurry vision, headaches  - in ER CT head negative, CTA chest with no dissection but increased bulk of metastatic disease. EKG without ischemia and negative troponin.  - headaches started 1 month ago. Denies any significant prior history of headaches. Occur 2-3 times a day. Gets sweaty, nauseous, with dry heaving and light headedness. Taking heavy amounts of BC powder to help with symptoms.  - Denies EtoH. Told he snores at night, no apneic episodes. Tired often during the day times though reports it more related to medicaitons.   - has not gone for blood work, has not seen neurology  3. Rectal CA  - followed by oncology, metastatic with lung mets  - not on chemo currently    Past Medical History  Diagnosis Date  . Hypertension   . Rectal cancer 2008    Adenocarcinoma; AP resection in 2008; recurrence with lung metastases in 2011  . Arteriosclerotic cardiovascular disease (ASCVD) 2011    CABG surgery in 2011  . Hyperlipidemia   . 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 Splenomegaly 01/23/2009    Additional GI history includes small bowel obstruction, requiring laparotomy and lysis of adhesions in 2009, remote peptic  ulcer disease and postoperative perirectal infection  . ALCOHOL ABUSE, HX OF 01/23/2009     Allergies  Allergen Reactions  . Erbitux [Cetuximab] Anaphylaxis     Current Outpatient Prescriptions  Medication Sig Dispense Refill  . amLODipine (NORVASC) 10 MG tablet Take 0.5 tablets (5 mg total) by mouth daily.  30 tablet  6  . aspirin 81 MG tablet Take 81 mg by mouth daily.        . Aspirin-Salicylamide-Caffeine (BC HEADACHE PO) Take 2 packets by mouth 4 (four) times daily as needed (headache).      Marland Kitchen dexamethasone (DECADRON) 4 MG tablet Take 2 tablets PO starting the day after chemo for 2 days. Take with food. Take the day after the 73fu pump has been removed x 2 days  30 tablet  1  . ezetimibe (ZETIA) 10 MG tablet Take 1 tablet (10 mg total) by mouth daily.  30 tablet  6  . lisinopril (PRINIVIL,ZESTRIL) 20 MG tablet Take 40 mg by mouth daily.      . metoprolol (LOPRESSOR) 50 MG tablet Take 50 mg by mouth 3 (three) times daily.      . multivitamin (THERAGRAN) per tablet Take 1 tablet by mouth daily.        . pantoprazole (PROTONIX) 20 MG tablet Take 1 tablet (20 mg total) by mouth daily.  90 tablet  3  . potassium chloride (K-DUR) 10 MEQ tablet Take 1 tablet (10 mEq total)  by mouth daily.  5 tablet  0  . promethazine (PHENERGAN) 25 MG tablet Take 1 tablet (25 mg total) by mouth every 6 (six) hours as needed for nausea.  30 tablet  6  . triamterene-hydrochlorothiazide (MAXZIDE-25) 37.5-25 MG per tablet Take 1 tablet by mouth daily.  90 tablet  3  . VIAGRA 50 MG tablet take 1 tablet by mouth once daily if needed  20 tablet  3   No current facility-administered medications for this visit.   Facility-Administered Medications Ordered in Other Visits  Medication Dose Route Frequency Provider Last Rate Last Dose  . sodium chloride 0.9 % injection 10 mL  10 mL Intravenous PRN Baird Cancer, PA-C   10 mL at 09/19/12 1037     Past Surgical History  Procedure Laterality Date  . Irrigation  and debridement sebaceous cyst    . Laparoscopic lysis intestinal adhesions  2009    With incidental appendectomy  . Coronary artery bypass graft  2011    2 vessel  . Abdominoperineal proctocolectomy  2008  . Portacath placement  2011  . Colostomy       Allergies  Allergen Reactions  . Erbitux [Cetuximab] Anaphylaxis      Family History  Problem Relation Age of Onset  . Cancer Mother   . Cancer Maternal Grandmother      Social History Martin Maldonado reports that he quit smoking about 3 years ago. He has never used smokeless tobacco. Martin Maldonado reports that he does not drink alcohol.   Review of Systems CONSTITUTIONAL: No weight loss, fever, chills, weakness or fatigue.  HEENT: Eyes: No visual loss, blurred vision, double vision or yellow sclerae.No hearing loss, sneezing, congestion, runny nose or sore throat.  SKIN: No rash or itching.  CARDIOVASCULAR:  RESPIRATORY: No shortness of breath, cough or sputum.  GASTROINTESTINAL: No anorexia, nausea, vomiting or diarrhea. No abdominal pain or blood.  GENITOURINARY: No burning on urination, no polyuria NEUROLOGICAL: No headache, dizziness, syncope, paralysis, ataxia, numbness or tingling in the extremities. No change in bowel or bladder control.  MUSCULOSKELETAL: No muscle, back pain, joint pain or stiffness.  LYMPHATICS: No enlarged nodes. No history of splenectomy.  PSYCHIATRIC: No history of depression or anxiety.  ENDOCRINOLOGIC: No reports of sweating, cold or heat intolerance. No polyuria or polydipsia.  Marland Kitchen   Physical Examination There were no vitals filed for this visit. There were no vitals filed for this visit.  Gen: resting comfortably, no acute distress HEENT: no scleral icterus, pupils equal round and reactive, no palptable cervical adenopathy,  CV Resp: Clear to auscultation bilaterally GI: abdomen is soft, non-tender, non-distended, normal bowel sounds, no hepatosplenomegaly MSK: extremities are warm, no edema.    Skin: warm, no rash Neuro:  no focal deficits Psych: appropriate affect   Diagnostic Studies 02/2012 LVEF 55%, cannot evaluate diastolic function,   56/38/75  Na 137 K 3.0 Cr 1.16 BUN 9     Assessment and Plan  1. CAD/Chest pain  - atypical chest pain, burning like sensation in mid chest. Potential GI etiology, he is taking a lot of NSAIDs for his headaches.  - start PPI   2.Headaches/HTN  - episodic severe HTN resistant to medications combined with intermittent headaches. 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 Will order plasma metanephrines and catecholamine  - patient hypertensive and hypokalemic, worrisome for possible hyperaldo. Will check renin/aldo levels  - in setting of severe HTN also check TSH. Pending this initial workup he may need a renal artery ultrasound  and potentially a sleep study  - will add maxide for blood pressure, continue K replacement and check BMET this week.  - refer to neurology for headaches  F/u 2-3 weeks       Arnoldo Lenis, M.D., F.A.C.C.

## 2013-06-17 NOTE — Progress Notes (Signed)
Martin Maldonado is seen as a work-in today as a scheduled acute appointment.  Last year, 2014, the patient lost his insurance which precluded him from receiving chemotherapy due to financial contraints.  However, our financial counselor recruited financial assistance, but the patient declined chemotherapy at that time.  He reported that he will schedule a follow-up appointment once he received his insurance in the fall of 2014.  He kept in touch with the clinic until the end of 2014, when he stopped checking in with [place].  He refused appointments and chemotherapy.  He now has [employer].  He notes that he has been seeing his cardiologist, and unfortunately, he had a "reaction" to one of the BP medications.  He reports that he was switched to a "fluid pill" and that corrected his HTN and his reaction ceased.  However, he expected that his declining condition was secondary to his BP medication reaction and he was waiting to feel better from that standpoint before he came back to the [facility].  Unfortunately, over the past months, he has not clinically improved and fortunately, his sister brought him to our clinic today.  This is the first we have seen the patient since 11/14/2012  The patient's sister called our clinic at the end of last week, requesting an acute appointment to see Martin Maldonado.   Presently there is a lot of symptoms going on with Martin Maldonado related to progression of his disease:  1. Nausea and vomiting-  He does not have any antiemetics at home presently.  He is suffering with the nausea and as a result, his appetite is diminished severely. This will be addressed with a combination of anti-emetics including Zofran (8 mg PO q8hr PRN), Compazine (10 mg PO q6 hr PRN), and Ativan (0.5 mg PO q6 hr PRN).  2. Decreased appetite- this is secondary to cancer progression in addition to nausea and vomiting.  Will not treat at this time.  Megace increases the risk of VTEs and with Martin Maldonado's recent decline in  performance status, I would fear VTE.  Additional options include Dexamethasone and Marinol.  3. Left shoulder and temporal (head) pain- these two pains correlate with each other, meaning when one hurts, the other hurts.  They do not occur exclusively.  Exam is benign.  Therefore, this is likely referred pain from his liver which has metastatic disease.   4. Sputum production- it is clear and unlikely infectious.  However, I will treat with Z-Pak given his extent of lung disease on CT scan in December 2014 that was performed in the ED.  5. Hoarseness of voice- secondary to malignancy  6. Dyspnea on exertion secondary to metastatic disease to lungs and deconditioning.  Will provide an albuterol inhaler.    The patient is seen during the office visit in pain of the left shoulder.  I have ordered PO morphine liquid 5 mg immediately.  This seemed to help with his discomfort.  Of note, he is opoid naive and this was kept in mind when he was prescribe PO pain medication as an outpatient.  The patient has 2 options and these were discussed in detail:  1. Transition care to Comfort measures and focus on symptom management.  This option is absolutely reasonable given his Stage IV metastatic rectal cancer and the incurability of this disease.  2. Restaging scans in preparation for future chemotherapy.  We would need restaging scans with either CT CAP + head with contrast or PET scan.  Martin Maldonado is concerned about  drinking the prep for his scans given his nausea.  [facility] is not too interested in going to [facility] for a PET scan.  These are our two options regarding restaging studies.  From this we can make treatment recommendations:   1. Intense systemic chemotherapy -?FOLFIRI + Avastin?   2. Palliative chemotherapy- ?Avastin + Xeloda or 5FU CI.   We discussed these options and he wishes to have some time to consider these options.    I personally reviewed and went over laboratory results with the patient.   The results are noted within this dictation.  I personally reviewed and went over radiographic studies with the patient.  The results are noted within this dictation.    Chart reviewed   BP 110/69  Pulse 95  Temp(Src) 97.5 F (36.4 C) (Oral)  Resp 20  Wt 180 lb 3.2 oz (81.738 kg)  SpO2 97% Gen: Thinner, acute distress related to pain, uncomfortable, hoarse voice, ill-appearing.  I was able to make him smile and laugh during our visit. HEENT: Atraumatic Neck: Trachea midline Lungs: B/L expiratory wheezing with diminished breath sounds in the right lower lobe. Cardiac: RRR Abd: deferred Extremities: No erythema, heat, or pain Skin: Warm and dry Back: No abnormalities noted Neuro: No focal deficits.  Performance status is a 2-3 (ECOG scale)   Assessment: 1. Progression of metastatic rectal cancer  2. Nausea + Vomiting 3. Left shoulder pain- referred pain from liver involvment 4. Left temporal (head) pain- referred pain 5. Decreased appetite 6. Voice hoarseness 7. Dyspnea on exertion 8. Declining performance status   Plan: 1. PO liquid morphine in the clinic stat.  5 mg 2. Chest xray on his way out of the office 3. Symptom management prescriptions:  A. Nausea/Vomiting   1. Zofran 8 mg every 8 hours PRN nausea/vomiting   2. Compazine 10 mg every 6 hours PRN nausea/vomiting   3. Ativan 0.5 mg PO every 6 hours PRN nausea/vomiting  B. Left shoulder and temporal pain   1. Oxycodone 5- 10 mg every 4 hours PRN pain  C. Dyspnea on exertion   1. Albuterol 2 puff PRN  D. Cough with clear sputum production   1. Z-Pak as directed 4. Treatment options discussed:  1. Comfort care  2. Restaging in preparation for systemic chemotherapy   A. FOLFIRI + Avastin versus Avastin + Xeloda or 5FU CI 5. He will consider his options and update [place] according to his goals of care. 6. Return in 1 week for follow-up  Patient and plan discussed with Dr. Farrel Gobble and he is in agreement  with the aforementioned.   Martin Maldonado 06/19/2013

## 2013-06-19 ENCOUNTER — Encounter (HOSPITAL_COMMUNITY): Payer: Medicare Other | Attending: Oncology | Admitting: Oncology

## 2013-06-19 ENCOUNTER — Ambulatory Visit (HOSPITAL_COMMUNITY)
Admission: RE | Admit: 2013-06-19 | Discharge: 2013-06-19 | Disposition: A | Discharge status: Home / Self Care | Payer: Medicare Other | Source: Ambulatory Visit | Attending: Oncology | Admitting: Oncology

## 2013-06-19 VITALS — BP 110/69 | HR 95 | Temp 97.5°F | Resp 20 | Wt 180.2 lb

## 2013-06-19 DIAGNOSIS — C7800 Secondary malignant neoplasm of unspecified lung: Secondary | ICD-10-CM | POA: Insufficient documentation

## 2013-06-19 DIAGNOSIS — R630 Anorexia: Secondary | ICD-10-CM | POA: Insufficient documentation

## 2013-06-19 DIAGNOSIS — R51 Headache: Secondary | ICD-10-CM

## 2013-06-19 DIAGNOSIS — M25512 Pain in left shoulder: Secondary | ICD-10-CM

## 2013-06-19 DIAGNOSIS — R05 Cough: Secondary | ICD-10-CM | POA: Insufficient documentation

## 2013-06-19 DIAGNOSIS — R490 Dysphonia: Secondary | ICD-10-CM

## 2013-06-19 DIAGNOSIS — R0602 Shortness of breath: Secondary | ICD-10-CM | POA: Insufficient documentation

## 2013-06-19 DIAGNOSIS — M25519 Pain in unspecified shoulder: Secondary | ICD-10-CM

## 2013-06-19 DIAGNOSIS — R519 Headache, unspecified: Secondary | ICD-10-CM

## 2013-06-19 DIAGNOSIS — R059 Cough, unspecified: Secondary | ICD-10-CM | POA: Insufficient documentation

## 2013-06-19 DIAGNOSIS — R112 Nausea with vomiting, unspecified: Secondary | ICD-10-CM | POA: Insufficient documentation

## 2013-06-19 DIAGNOSIS — R599 Enlarged lymph nodes, unspecified: Secondary | ICD-10-CM | POA: Insufficient documentation

## 2013-06-19 DIAGNOSIS — I10 Essential (primary) hypertension: Secondary | ICD-10-CM | POA: Insufficient documentation

## 2013-06-19 DIAGNOSIS — R5381 Other malaise: Secondary | ICD-10-CM

## 2013-06-19 DIAGNOSIS — C787 Secondary malignant neoplasm of liver and intrahepatic bile duct: Secondary | ICD-10-CM | POA: Insufficient documentation

## 2013-06-19 DIAGNOSIS — R0609 Other forms of dyspnea: Secondary | ICD-10-CM

## 2013-06-19 DIAGNOSIS — C20 Malignant neoplasm of rectum: Secondary | ICD-10-CM | POA: Insufficient documentation

## 2013-06-19 DIAGNOSIS — R0600 Dyspnea, unspecified: Secondary | ICD-10-CM

## 2013-06-19 LAB — CBC WITH DIFFERENTIAL/PLATELET
BASOS ABS: 0.1 10*3/uL (ref 0.0–0.1)
BASOS PCT: 1 % (ref 0–1)
Eosinophils Absolute: 0.4 10*3/uL (ref 0.0–0.7)
Eosinophils Relative: 4 % (ref 0–5)
HCT: 37.7 % — ABNORMAL LOW (ref 39.0–52.0)
Hemoglobin: 12.5 g/dL — ABNORMAL LOW (ref 13.0–17.0)
Lymphocytes Relative: 10 % — ABNORMAL LOW (ref 12–46)
Lymphs Abs: 1 10*3/uL (ref 0.7–4.0)
MCH: 29.7 pg (ref 26.0–34.0)
MCHC: 33.2 g/dL (ref 30.0–36.0)
MCV: 89.5 fL (ref 78.0–100.0)
MONO ABS: 0.6 10*3/uL (ref 0.1–1.0)
Monocytes Relative: 6 % (ref 3–12)
NEUTROS PCT: 79 % — AB (ref 43–77)
Neutro Abs: 7.9 10*3/uL — ABNORMAL HIGH (ref 1.7–7.7)
Platelets: 307 10*3/uL (ref 150–400)
RBC: 4.21 MIL/uL — ABNORMAL LOW (ref 4.22–5.81)
RDW: 15.4 % (ref 11.5–15.5)
WBC: 9.9 10*3/uL (ref 4.0–10.5)

## 2013-06-19 LAB — COMPREHENSIVE METABOLIC PANEL
ALBUMIN: 3.6 g/dL (ref 3.5–5.2)
ALT: 18 U/L (ref 0–53)
AST: 31 U/L (ref 0–37)
Alkaline Phosphatase: 97 U/L (ref 39–117)
BILIRUBIN TOTAL: 0.2 mg/dL — AB (ref 0.3–1.2)
BUN: 20 mg/dL (ref 6–23)
CALCIUM: 9.8 mg/dL (ref 8.4–10.5)
CHLORIDE: 98 meq/L (ref 96–112)
CO2: 20 mEq/L (ref 19–32)
CREATININE: 1.64 mg/dL — AB (ref 0.50–1.35)
GFR calc Af Amer: 52 mL/min — ABNORMAL LOW (ref >90)
GFR, EST NON AFRICAN AMERICAN: 45 mL/min — AB (ref >90)
Glucose, Bld: 93 mg/dL (ref 70–99)
Potassium: 3.7 mEq/L (ref 3.7–5.3)
Sodium: 139 mEq/L (ref 137–147)
Total Protein: 7.6 g/dL (ref 6.0–8.3)

## 2013-06-19 MED ORDER — HEPARIN SOD (PORK) LOCK FLUSH 100 UNIT/ML IV SOLN
INTRAVENOUS | Status: AC
  Filled 2013-06-19: qty 5

## 2013-06-19 MED ORDER — AZITHROMYCIN 250 MG PO TABS: ORAL_TABLET | ORAL | Status: DC

## 2013-06-19 MED ORDER — PROCHLORPERAZINE MALEATE 10 MG PO TABS
10.0000 mg | ORAL_TABLET | Freq: Four times a day (QID) | ORAL | Status: DC | PRN
Start: 2013-06-19 — End: 2013-11-07

## 2013-06-19 MED ORDER — HEPARIN SOD (PORK) LOCK FLUSH 100 UNIT/ML IV SOLN
500.0000 [IU] | Freq: Once | INTRAVENOUS | Status: AC
  Administered 2013-06-19: 500 [IU] via INTRAVENOUS

## 2013-06-19 MED ORDER — ONDANSETRON HCL 8 MG PO TABS: 8.0000 mg | ORAL_TABLET | Freq: Three times a day (TID) | ORAL | Status: DC | PRN

## 2013-06-19 MED ORDER — ALBUTEROL SULFATE HFA 108 (90 BASE) MCG/ACT IN AERS: 2.0000 | INHALATION_SPRAY | Freq: Four times a day (QID) | RESPIRATORY_TRACT | Status: DC | PRN

## 2013-06-19 MED ORDER — SODIUM CHLORIDE 0.9 % IJ SOLN
10.0000 mL | INTRAMUSCULAR | Status: DC | PRN
  Administered 2013-06-19: 10 mL via INTRAVENOUS

## 2013-06-19 MED ORDER — OXYCODONE HCL 5 MG PO TABS: 5.0000 mg | ORAL_TABLET | ORAL | Status: DC | PRN

## 2013-06-19 MED ORDER — OXYCODONE HCL 5 MG/5ML PO SOLN
5.0000 mg | Freq: Once | ORAL | Status: AC
  Administered 2013-06-19: 5 mg via ORAL
  Filled 2013-06-19: qty 5

## 2013-06-19 MED ORDER — LORAZEPAM 0.5 MG PO TABS: 0.5000 mg | ORAL_TABLET | Freq: Three times a day (TID) | ORAL | Status: DC | PRN

## 2013-06-19 NOTE — Progress Notes (Signed)
Martin Maldonado presented for Portacath access and flush. Proper placement of portacath confirmed by CXR. Portacath located rt chest wall accessed with  H 20 needle. Good blood return present. Portacath flushed with 34ml NS and 500U/69ml Heparin and needle removed intact. Procedure without incident. Patient tolerated procedure well.

## 2013-06-19 NOTE — Patient Instructions (Addendum)
[facility] Discharge Instructions  RECOMMENDATIONS MADE BY THE CONSULTANT AND ANY TEST RESULTS WILL BE SENT TO YOUR REFERRING PHYSICIAN.  EXAM FINDINGS BY THE PHYSICIAN TODAY AND SIGNS OR SYMPTOMS TO REPORT TO CLINIC OR PRIMARY PHYSICIAN:   We are prescribing you an antibiotic.  We can give you 5FU (IV pump) and Avastin.   We will order CT scans or a PET scan if you want them. Call and talk to [facility] about it.   We are going to get your pain and nausea under control.  We want to see you in 1 week to see Tom.   You have a lot of prescriptions that we are giving you.  You had Oxycodone 5mg  in liquid form @ 12:20.   Port flushed today.        Thank you for choosing [facility] to provide your oncology and hematology care.  To afford each patient quality time with our providers, please arrive at least 15 minutes before your scheduled appointment time.  With your help, our goal is to use those 15 minutes to complete the necessary work-up to ensure our physicians have the information they need to help with your evaluation and healthcare recommendations.    Effective January 1st, 2014, we ask that you re-schedule your appointment with our physicians should you arrive 10 or more minutes late for your appointment.  We strive to give you quality time with our providers, and arriving late affects you and other patients whose appointments are after yours.    Again, thank you for choosing [facility]  Our hope is that these requests will decrease the amount of time that you wait before being seen by our physicians.       _____________________________________________________________  Should you have questions after your visit to [facility], please contact our office at [phone] between the hours of 8:30 a.m. and 5:00 p.m.  Voicemails left after 4:30 p.m. will not be returned until the following business day.  For prescription  refill requests, have your pharmacy contact our office with your prescription refill request.

## 2013-06-20 LAB — CEA: CEA: 63.1 ng/mL — ABNORMAL HIGH (ref 0.0–5.0)

## 2013-06-24 NOTE — Progress Notes (Signed)
[facility], [place] [address]  Rectal cancer - Plan: CT Abdomen Pelvis W Contrast, CT Chest W Contrast, CT Head W Wo Contrast, CBC with Differential, Comprehensive metabolic panel, CEA, Urinalysis, dipstick only  CURRENT THERAPY: Consideration of treatment options.  INTERVAL HISTORY: Martin Maldonado 58 y.o. male returns for  regular  visit for followup of Recurrent progressive metastatic rectal cancer to lungs (KRAS WILDTYPE). The patient lost his health insurance last year and he went missing from the clinic and follow-up as a result.  We reached out to him multiple times, but he reports his mobile device has not been working. He was last treated with 5FU+Leucovorin + Avastin on 12/02/2012 before he went missing.  Of note, he did react to Erbitux on 10/31/2012 and that was his last dose of that antibody.  The patient was seen as a work-in last week on 06/19/13 after failure to thrive.  His sister demanded that he come to the clinic.  At that time, past scans were reviewed and obvious progression of disease was noted.   On 06/19/2013, we focused on palliative management of a number of his issues related to his progression of disease.  I invite the reader to review dictation from that date.   I personally reviewed and went over laboratory results with the patient.  The results are noted within this dictation.  CEA is noted to be elevated at 63.1.  We had a long conversation about his options.  [name] is interested in chemotherapy and therefore, I will restage him with CT CAP and CT of head to complete the staging process.  I suspect this can be done the first of next week.    We discussed chemotherapy options.  Due to his tumor burden, if he wants chemotherapy, he would benefit from aggressive therapy to gain tumor control.  I have offered him FOLFOX + Avastin versus FOLFIRI + Avastin.  He has received both chemotherapy regimens in the past.  We discussed the side effects.  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  His left head pain has resolved.  His shoulder pain is improved but remains and this is referred pain from his liver mets.  He is invited to increase his pain medication at home. His nausea is improved and his appetite has slightly improved.  His weight is up 6 lbs.   Past Medical History  Diagnosis Date  . Hypertension   . Rectal cancer 2008    Adenocarcinoma; AP resection in 2008; recurrence with lung metastases in 2011  . Arteriosclerotic cardiovascular disease (ASCVD) 2011    CABG surgery in 2011  . Hyperlipidemia   . 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 Splenomegaly 01/23/2009    Additional GI history includes small bowel obstruction, requiring laparotomy and lysis of adhesions in 2009, remote peptic ulcer disease and postoperative perirectal infection  . ALCOHOL ABUSE, HX OF 01/23/2009    has HYPERLIPIDEMIA; Tobacco abuse, in remission; HYPERTENSION; COPD; SPLENOMEGALY; SMALL BOWEL OBSTRUCTION, HX OF; Rectal cancer; and CAD (coronary artery disease) of artery bypass graft on his problem list.     is allergic to erbitux.  Martin Maldonado does not currently have medications on file.  Past Surgical History  Procedure Laterality Date  . Irrigation and debridement sebaceous cyst    . Laparoscopic lysis intestinal adhesions  2009    With incidental appendectomy  .  Coronary artery bypass graft  2011    2 vessel  . Abdominoperineal proctocolectomy  2008  . Portacath placement  2011  . Colostomy      Denies any headaches, dizziness, double vision, fevers, chills, night sweats, nausea, vomiting, diarrhea, constipation, chest pain, heart palpitations, shortness of breath, blood in stool, black tarry stool, urinary pain, urinary burning, urinary frequency, hematuria.   PHYSICAL EXAMINATION  ECOG PERFORMANCE STATUS: 1 - Symptomatic but  completely ambulatory  Filed Vitals:   06/26/13 1400  BP: 145/80  Pulse: 107  Temp: 98.3 F (36.8 C)  Resp: 18    GENERAL:alert, comfortable, cooperative, ill looking and smiling SKIN: skin color, texture, turgor are normal, no rashes or significant lesions HEAD: Normocephalic, No masses, lesions, tenderness or abnormalities EYES: normal, PERRLA, EOMI EARS: External ears normal OROPHARYNX:mucous membranes are moist  NECK: supple, trachea midline LYMPH:  not examined BREAST:not examined LUNGS: not examined HEART: not examined ABDOMEN:not examined BACK: Back symmetric, no curvature. EXTREMITIES:no skin discoloration, no cyanosis  NEURO: alert & oriented x 3 with fluent speech, no focal motor/sensory deficits, gait normal   LABORATORY DATA: CBC    Component Value Date/Time   WBC 9.9 06/19/2013 1224   RBC 4.21* 06/19/2013 1224   HGB 12.5* 06/19/2013 1224   HCT 37.7* 06/19/2013 1224   PLT 307 06/19/2013 1224   MCV 89.5 06/19/2013 1224   MCH 29.7 06/19/2013 1224   MCHC 33.2 06/19/2013 1224   RDW 15.4 06/19/2013 1224   LYMPHSABS 1.0 06/19/2013 1224   MONOABS 0.6 06/19/2013 1224   EOSABS 0.4 06/19/2013 1224   BASOSABS 0.1 06/19/2013 1224      Chemistry      Component Value Date/Time   NA 139 06/19/2013 1224   K 3.7 06/19/2013 1224   CL 98 06/19/2013 1224   CO2 20 06/19/2013 1224   BUN 20 06/19/2013 1224   CREATININE 1.64* 06/19/2013 1224      Component Value Date/Time   CALCIUM 9.8 06/19/2013 1224   ALKPHOS 97 06/19/2013 1224   AST 31 06/19/2013 1224   ALT 18 06/19/2013 1224   BILITOT 0.2* 06/19/2013 1224     Lab Results  Component Value Date   CEA 63.1* 06/19/2013      RADIOGRAPHIC STUDIES:  06/19/2013  CLINICAL DATA: Cough and congestion and hoarseness and shortness of  breath for 3 weeks; known metastatic rectal malignancy.  EXAM:  CHEST 2 VIEW  COMPARISON: CT ANGIO CHEST AORTA W/CM & WO/CM dated 03/02/2013  FINDINGS:  Since the previous study there has been  interval increase in the  size of the multiple bilateral pulmonary parenchymal masses. The  lungs are well-expanded. There is no pleural effusion or  pneumothorax. The cardiac silhouette is normal in size. The  pulmonary vascularity is not engorged. The patient has undergone  previous CABG. Hilar and mediastinal lymphadenopathy is present. The  observed portions of the bony thorax exhibit no acute abnormalities.  IMPRESSION:  There has been interval growth in the size of the bilateral  metastatic pulmonary parenchymal masses. There is persistent hilar  and mediastinal lymphadenopathy. There is no evidence of pneumonia  nor CHF.  Electronically Signed  By: David Martinique  On: 06/19/2013 14:36     ASSESSMENT:  1.  Recurrent progressive metastatic rectal cancer to lungs (KRAS WILDTYPE).  2. Nausea + Vomiting, improved 3. Left shoulder pain- referred pain from liver involvement, improved 4. Left temporal (head) pain- resolved 5. Decreased appetite, improved 6. Voice hoarseness  7. Dyspnea on  exertion  8. Declining performance status  Patient Active Problem List   Diagnosis Date Noted  . CAD (coronary artery disease) of artery bypass graft 02/12/2013  . Rectal cancer 09/22/2010  . HYPERLIPIDEMIA 11/06/2009  . Tobacco abuse, in remission 09/02/2009  . COPD 01/23/2009  . SPLENOMEGALY 01/23/2009  . SMALL BOWEL OBSTRUCTION, HX OF 01/23/2009  . HYPERTENSION 01/22/2009     PLAN:  1. I personally reviewed and went over laboratory results with the patient.  The results are noted within this dictation. 2. I personally reviewed and went over radiographic studies with the patient.  The results are noted within this dictation.   3. Intervention options:   A. Comfort care- this is reasonable given extent of disease, diagnosis, and prognosis  B. Chemotherapeutic intervention- this would require restaging scans CT CAP versus PET scan.  Depending on extent of disease, chemotherapy regimens  could be recommended.  4. He has opted for chemotherpay:  A. Will set-up CT CAP with contrast and CT of head w and wo contrast for beginning of next week  B. Will start FOLFIRI + Avastin next Tuesday  C. Chemotherapy teaching will take place 5. FOLFIRI + Avastin chemotherapy treatment plan developed 6. Antibody plan of Avastin 5 mg/kg every 2 weeks developed 7. Chemotherapy teaching in the near future 8. Return in 2-3 weeks for follow-up   THERAPY PLAN:  He has opted for chemotherapy and therefore we will pursue FOLFIRI + Avastin next week following restaging CT scans.  Scan will not limit our ability to give chemotherapy and can be performed after starting chemotherapy if not approved through insurance in a timely manner.  All questions were answered. The patient knows to call the clinic with any problems, questions or concerns. We can certainly see the patient much sooner if necessary.  Patient and plan discussed with Dr. Farrel Gobble and he is in agreement with the aforementioned.   Nikoletta Varma 06/26/2013

## 2013-06-26 ENCOUNTER — Encounter (HOSPITAL_COMMUNITY): Payer: Medicare Other | Attending: Oncology | Admitting: Oncology

## 2013-06-26 VITALS — BP 145/80 | HR 107 | Temp 98.3°F | Resp 18 | Wt 186.5 lb

## 2013-06-26 DIAGNOSIS — C20 Malignant neoplasm of rectum: Secondary | ICD-10-CM

## 2013-06-26 DIAGNOSIS — I10 Essential (primary) hypertension: Secondary | ICD-10-CM

## 2013-06-26 DIAGNOSIS — R112 Nausea with vomiting, unspecified: Secondary | ICD-10-CM | POA: Insufficient documentation

## 2013-06-26 DIAGNOSIS — I2510 Atherosclerotic heart disease of native coronary artery without angina pectoris: Secondary | ICD-10-CM

## 2013-06-26 DIAGNOSIS — C7931 Secondary malignant neoplasm of brain: Secondary | ICD-10-CM | POA: Insufficient documentation

## 2013-06-26 DIAGNOSIS — C7949 Secondary malignant neoplasm of other parts of nervous system: Secondary | ICD-10-CM

## 2013-06-26 DIAGNOSIS — J449 Chronic obstructive pulmonary disease, unspecified: Secondary | ICD-10-CM

## 2013-06-26 DIAGNOSIS — C7800 Secondary malignant neoplasm of unspecified lung: Secondary | ICD-10-CM

## 2013-06-26 NOTE — Patient Instructions (Signed)
[facility] Discharge Instructions  RECOMMENDATIONS MADE BY THE CONSULTANT AND ANY TEST RESULTS WILL BE SENT TO YOUR REFERRING PHYSICIAN.  EXAM FINDINGS BY THE PHYSICIAN TODAY AND SIGNS OR SYMPTOMS TO REPORT TO CLINIC OR PRIMARY PHYSICIAN: Exam and findings as discussed by Meriel Flavors.  MEDICATIONS PRESCRIBED:  Continue as prescribed.  INSTRUCTIONS/FOLLOW-UP: CT scans as soon as possible. Start chemotherapy next Tue 07/03/13. MD appointment again in 4 weeks.  Thank you for choosing [facility] to provide your oncology and hematology care.  To afford each patient quality time with our providers, please arrive at least 15 minutes before your scheduled appointment time.  With your help, our goal is to use those 15 minutes to complete the necessary work-up to ensure our physicians have the information they need to help with your evaluation and healthcare recommendations.    Effective January 1st, 2014, we ask that you re-schedule your appointment with our physicians should you arrive 10 or more minutes late for your appointment.  We strive to give you quality time with our providers, and arriving late affects you and other patients whose appointments are after yours.    Again, thank you for choosing [facility].  Our hope is that these requests will decrease the amount of time that you wait before being seen by our physicians.       _____________________________________________________________  Should you have questions after your visit to [facility], please contact our office at [phone] between the hours of 8:30 a.m. and 5:00 p.m.  Voicemails left after 4:30 p.m. will not be returned until the following business day.  For prescription refill requests, have your pharmacy contact our office with your prescription refill request.

## 2013-06-29 NOTE — Patient Instructions (Addendum)
[place]   CHEMOTHERAPY INSTRUCTIONS  Irinotecan is a chemotherapy drug primarily used to treat colon and rectal cancer. This drug like other chemo can cause lowering of blood counts, increased risk of infection, bleeding or bruising more easily, fatigue, hair loss, mouth sores, stomach cramps, diarrhea, and nausea/ vomiting. The main symptom that we look for with this drug is stomach cramps and diarrhea. If you develop diarrhea we want you to take 2 Imodium after the first loose stool then take 1 Imodium every 2 hrs until you go 12 hrs without diarrhea. It is very important that you drink 6-8 glasses of fluids daily while going thru chemo especially if you are having diarrhea. We do not want you to get dehydrated. If the Imodium does not stop diarrhea within 12 hrs please call us at [phone]. If this occurs on the weekend report to the ED.  Leucovorin - this is a medication that is not chemo but given with chemo. This med "rescues" the healthy cells before we administer the drug 5FU. This makes the 5FU work better.   5FU: bone marrow suppression (low white blood cells - wbcs fight infection, low red blood cells - rbcs make up your blood, low platelets - this is what makes your blood clot, nausea/vomiting, diarrhea, mouth sores, hair loss, dry skin, ocular toxicities (increased tear production, sensitivity to light). You must wear sunscreen/sunglasses. Cover your skin when out in sunlight. You will get burned very easily.    Avastin - this treatment is called an anti-angiogenic agent. Avastin is thought to work by causing the blood vessels to shrink away from the tumor, blocking the supply of nutrients/oxygen that the tumor needs to survive. It may also interfere with the growth of new blood vessels, causing the tumor to starve. Side Effects: Nosebleeds, high blood pressure, protein in the urine, weakness, diarrhea.  SELF CARE ACTIVITIES WHILE ON  CHEMOTHERAPY:  Increase your fluid intake 48 hours prior to treatment and drink at least 2 quarts per day after treatment., No alcohol intake., No aspirin or other medications unless approved by your oncologist., Eat foods that are light and easy to digest., Eat foods at cold or room temperature., No fried, fatty, or spicy foods immediately before or after treatment., Have teeth cleaned professionally before starting treatment. Keep dentures and partial plates clean., Use soft toothbrush and do not use mouthwashes that contain alcohol. Biotene is a good mouthwash that is available at most pharmacies or may be ordered by calling [phone]., Use warm salt water gargles (1 teaspoon salt per 1 quart warm water) before and after meals and at bedtime. Or you may rinse with 2 tablespoons of three -percent hydrogen peroxide mixed in eight ounces of water., Always use sunscreen with SPF (Sun Protection Factor) of 30 or higher., Use your nausea medication as directed to prevent nausea., Use your stool softener or laxative as directed to prevent constipation. and Use your anti-diarrheal medication as directed to stop diarrhea.  Please wash your hands for at least 30 seconds using warm soapy water. Handwashing is the #1 way to prevent the spread of germs. Stay away from sick people or people who are getting over a cold. If you develop respiratory systems such as green/yellow mucus production or productive cough or persistent cough let us know and we will see if you need an antibiotic. It is a good idea to keep a pair of gloves on when going into grocery stores/[employer] to decrease your  risk of coming into contact with germs on the carts, etc. Carry alcohol hand gel with you at all times and use it frequently if out in public. All foods need to be cooked thoroughly. No raw foods. No medium or undercooked meats, eggs. If your food is cooked medium well, it does not need to be hot pink or saturated with bloody liquid at  all. Vegetables and fruits need to be washed/rinsed under the faucet with a dish detergent before being consumed. You can eat raw fruits and vegetables unless we tell you otherwise but it would be best if you cooked them or bought frozen. Do not eat off of salad bars or hot bars unless you really trust the cleanliness of the restaurant. If you need dental work, please let us know before you go for your appointment so that we can coordinate the best possible time for you in regards to your chemo regimen. You need to also let your dentist know that you are actively taking chemo. We may need to do labs prior to your dental appointment. We also want your bowels moving at least every other day. If this is not happening, we need to know so that we can get you on a bowel regimen to help you go.    MEDICATIONS: You have been given prescriptions for the following medications:  Zofran 8mg . Take 1 tablet three times a day as needed for nausea/vomiting.   Over-the-Counter Meds:  Colace - this is a stool softener. Take 100mg  capsule 2-6 times a day as needed. If you have to take more than 6 capsules of Colace a day call the Cancer Center.  Senna - this is a mild laxative used to treat mild constipation. May take 2 tabs by mouth daily or up to twice a day as needed for mild constipation.  Milk of Magnesia - this is a laxative used to treat moderate to severe constipation. May take 2-4 tablespoons every 8 hours as needed. May increase to 8 tablespoons x 1 dose and if no bowel movement call the Cancer Center.  Imodium - this is for diarrhea. Take 2 tabs after 1st loose stool and then 1 tab after each loose stool until you go a total of 12 hours without a loose stool. Call Cancer Center if loose stools continue.  SYMPTOMS TO REPORT AS SOON AS POSSIBLE AFTER TREATMENT:  FEVER GREATER THAN 100.5 F  CHILLS WITH OR WITHOUT FEVER  NAUSEA AND VOMITING THAT IS NOT CONTROLLED WITH YOUR NAUSEA MEDICATION  UNUSUAL  SHORTNESS OF BREATH  UNUSUAL BRUISING OR BLEEDING  TENDERNESS IN MOUTH AND THROAT WITH OR WITHOUT PRESENCE OF ULCERS  URINARY PROBLEMS  BOWEL PROBLEMS  UNUSUAL RASH    Wear comfortable clothing and clothing appropriate for easy access to any Portacath or PICC line. Let us know if there is anything that we can do to make your therapy better!      I have been informed and understand all of the instructions given to me and have received a copy. I have been instructed to call the clinic [phone] or my family physician as soon as possible for continued medical care, if indicated. I do not have any more questions at this time but understand that I may call the Cancer Center or the Patient Navigator at [phone] during office hours should I have questions or need assistance in obtaining follow-up care.      _________________________________________      _______________  __________ Signature of Patient or Authorized Representative        Date                            Time      _________________________________________ Nurse's Signature      Leucovorin injection What is this medicine? LEUCOVORIN (loo koe VOR in) is used to prevent or treat the harmful effects of some medicines. This medicine is used to treat anemia caused by a low amount of folic acid in the body. It is also used with 5-fluorouracil (5-FU) to treat colon cancer. This medicine may be used for other purposes; ask your health care provider or pharmacist if you have questions. What should I tell my health care provider before I take this medicine? They need to know if you have any of these conditions: -anemia from low levels of vitamin B-12 in the blood -an unusual or allergic reaction to leucovorin, folic acid, other medicines, foods, dyes, or preservatives -pregnant or trying to get pregnant -breast-feeding How should I use this medicine? This medicine is for injection into a muscle or into a  vein. It is given by a health care professional in a hospital or clinic setting. Talk to your pediatrician regarding the use of this medicine in children. Special care may be needed. Overdosage: If you think you have taken too much of this medicine contact a poison control center or emergency room at once. NOTE: This medicine is only for you. Do not share this medicine with others. What if I miss a dose? This does not apply. What may interact with this medicine? -capecitabine -fluorouracil -phenobarbital -phenytoin -primidone -trimethoprim-sulfamethoxazole This list may not describe all possible interactions. Give your health care provider a list of all the medicines, herbs, non-prescription drugs, or dietary supplements you use. Also tell them if you smoke, drink alcohol, or use illegal drugs. Some items may interact with your medicine. What should I watch for while using this medicine? Your condition will be monitored carefully while you are receiving this medicine. This medicine may increase the side effects of 5-fluorouracil, 5-FU. Tell your doctor or health care professional if you have diarrhea or mouth sores that do not get better or that get worse. What side effects may I notice from receiving this medicine? Side effects that you should report to your doctor or health care professional as soon as possible: -allergic reactions like skin rash, itching or hives, swelling of the face, lips, or tongue -breathing problems -fever, infection -mouth sores -unusual bleeding or bruising -unusually weak or tired Side effects that usually do not require medical attention (report to your doctor or health care professional if they continue or are bothersome): -constipation or diarrhea -loss of appetite -nausea, vomiting This list may not describe all possible side effects. Call your doctor for medical advice about side effects. You may report side effects to FDA at 1-800-FDA-1088. Where should  I keep my medicine? This drug is given in a hospital or clinic and will not be stored at home. NOTE: This sheet is a summary. It may not cover all possible information. If you have questions about this medicine, talk to your doctor, pharmacist, or health care provider.  2014, Elsevier/Gold Standard. (2007-09-12 16:50:29) Fluorouracil, 5-FU injection What is this medicine? FLUOROURACIL, 5-FU (flure oh YOOR a sil) is a chemotherapy drug. It slows the growth of cancer cells. This medicine is used to treat many types of cancer like breast  cancer, colon or rectal cancer, pancreatic cancer, and stomach cancer. This medicine may be used for other purposes; ask your health care provider or pharmacist if you have questions. COMMON BRAND NAME(S): Adrucil What should I tell my health care provider before I take this medicine? They need to know if you have any of these conditions: -blood disorders -dihydropyrimidine dehydrogenase (DPD) deficiency -infection (especially a virus infection such as chickenpox, cold sores, or herpes) -kidney disease -liver disease -malnourished, poor nutrition -recent or ongoing radiation therapy -an unusual or allergic reaction to fluorouracil, other chemotherapy, other medicines, foods, dyes, or preservatives -pregnant or trying to get pregnant -breast-feeding How should I use this medicine? This drug is given as an infusion or injection into a vein. It is administered in a hospital or clinic by a specially trained health care professional. Talk to your pediatrician regarding the use of this medicine in children. Special care may be needed. Overdosage: If you think you have taken too much of this medicine contact a poison control center or emergency room at once. NOTE: This medicine is only for you. Do not share this medicine with others. What if I miss a dose? It is important not to miss your dose. Call your doctor or health care professional if you are unable to keep  an appointment. What may interact with this medicine? -allopurinol -cimetidine -dapsone -digoxin -hydroxyurea -leucovorin -levamisole -medicines for seizures like ethotoin, fosphenytoin, phenytoin -medicines to increase blood counts like filgrastim, pegfilgrastim, sargramostim -medicines that treat or prevent blood clots like warfarin, enoxaparin, and dalteparin -methotrexate -metronidazole -pyrimethamine -some other chemotherapy drugs like busulfan, cisplatin, estramustine, vinblastine -trimethoprim -trimetrexate -vaccines Talk to your doctor or health care professional before taking any of these medicines: -acetaminophen -aspirin -ibuprofen -ketoprofen -naproxen This list may not describe all possible interactions. Give your health care provider a list of all the medicines, herbs, non-prescription drugs, or dietary supplements you use. Also tell them if you smoke, drink alcohol, or use illegal drugs. Some items may interact with your medicine. What should I watch for while using this medicine? Visit your doctor for checks on your progress. This drug may make you feel generally unwell. This is not uncommon, as chemotherapy can affect healthy cells as well as cancer cells. Report any side effects. Continue your course of treatment even though you feel ill unless your doctor tells you to stop. In some cases, you may be given additional medicines to help with side effects. Follow all directions for their use. Call your doctor or health care professional for advice if you get a fever, chills or sore throat, or other symptoms of a cold or flu. Do not treat yourself. This drug decreases your body's ability to fight infections. Try to avoid being around people who are sick. This medicine may increase your risk to bruise or bleed. Call your doctor or health care professional if you notice any unusual bleeding. Be careful brushing and flossing your teeth or using a toothpick because you may  get an infection or bleed more easily. If you have any dental work done, tell your dentist you are receiving this medicine. Avoid taking products that contain aspirin, acetaminophen, ibuprofen, naproxen, or ketoprofen unless instructed by your doctor. These medicines may hide a fever. Do not become pregnant while taking this medicine. Women should inform their doctor if they wish to become pregnant or think they might be pregnant. There is a potential for serious side effects to an unborn child. Talk to your health care professional  or pharmacist for more information. Do not breast-feed an infant while taking this medicine. Men should inform their doctor if they wish to father a child. This medicine may lower sperm counts. Do not treat diarrhea with over the counter products. Contact your doctor if you have diarrhea that lasts more than 2 days or if it is severe and watery. This medicine can make you more sensitive to the sun. Keep out of the sun. If you cannot avoid being in the sun, wear protective clothing and use sunscreen. Do not use sun lamps or tanning beds/booths. What side effects may I notice from receiving this medicine? Side effects that you should report to your doctor or health care professional as soon as possible: -allergic reactions like skin rash, itching or hives, swelling of the face, lips, or tongue -low blood counts - this medicine may decrease the number of white blood cells, red blood cells and platelets. You may be at increased risk for infections and bleeding. -signs of infection - fever or chills, cough, sore throat, pain or difficulty passing urine -signs of decreased platelets or bleeding - bruising, pinpoint red spots on the skin, black, tarry stools, blood in the urine -signs of decreased red blood cells - unusually weak or tired, fainting spells, lightheadedness -breathing problems -changes in vision -chest pain -mouth sores -nausea and vomiting -pain, swelling,  redness at site where injected -pain, tingling, numbness in the hands or feet -redness, swelling, or sores on hands or feet -stomach pain -unusual bleeding Side effects that usually do not require medical attention (report to your doctor or health care professional if they continue or are bothersome): -changes in finger or toe nails -diarrhea -dry or itchy skin -hair loss -headache -loss of appetite -sensitivity of eyes to the light -stomach upset -unusually teary eyes This list may not describe all possible side effects. Call your doctor for medical advice about side effects. You may report side effects to FDA at 1-800-FDA-1088. Where should I keep my medicine? This drug is given in a hospital or clinic and will not be stored at home. NOTE: This sheet is a summary. It may not cover all possible information. If you have questions about this medicine, talk to your doctor, pharmacist, or health care provider.  2014, Elsevier/Gold Standard. (2007-07-12 13:53:16) Bevacizumab injection What is this medicine? BEVACIZUMAB (be va SIZ yoo mab) is a chemotherapy drug. It targets a protein found in many cancer cell types, and halts cancer growth. This drug treats many cancers including non-small cell lung cancer, and colon or rectal cancer. It is usually given with other chemotherapy drugs. This medicine may be used for other purposes; ask your health care provider or pharmacist if you have questions. COMMON BRAND NAME(S): Avastin What should I tell my health care provider before I take this medicine? They need to know if you have any of these conditions: -blood clots -heart disease, including heart failure, heart attack, or chest pain (angina) -high blood pressure -infection (especially a virus infection such as chickenpox, cold sores, or herpes) -kidney disease -lung disease -prior chemotherapy with doxorubicin, daunorubicin, epirubicin, or other anthracycline type chemotherapy  agents -recent or ongoing radiation therapy -recent surgery -stroke -an unusual or allergic reaction to bevacizumab, hamster proteins, mouse proteins, other medicines, foods, dyes, or preservatives -pregnant or trying to get pregnant -breast-feeding How should I use this medicine? This medicine is for infusion into a vein. It is given by a health care professional in a hospital or clinic setting. Talk  to your pediatrician regarding the use of this medicine in children. Special care may be needed. Overdosage: If you think you have taken too much of this medicine contact a poison control center or emergency room at once. NOTE: This medicine is only for you. Do not share this medicine with others. What if I miss a dose? It is important not to miss your dose. Call your doctor or health care professional if you are unable to keep an appointment. What may interact with this medicine? Interactions are not expected. This list may not describe all possible interactions. Give your health care provider a list of all the medicines, herbs, non-prescription drugs, or dietary supplements you use. Also tell them if you smoke, drink alcohol, or use illegal drugs. Some items may interact with your medicine. What should I watch for while using this medicine? Your condition will be monitored carefully while you are receiving this medicine. You will need important blood work and urine testing done while you are taking this medicine. During your treatment, let your health care professional know if you have any unusual symptoms, such as difficulty breathing. This medicine may rarely cause 'gastrointestinal perforation' (holes in the stomach, intestines or colon), a serious side effect requiring surgery to repair. This medicine should be started at least 28 days following major surgery and the site of the surgery should be totally healed. Check with your doctor before scheduling dental work or surgery while you are  receiving this treatment. Talk to your doctor if you have recently had surgery or if you have a wound that has not healed. Do not become pregnant while taking this medicine. Women should inform their doctor if they wish to become pregnant or think they might be pregnant. There is a potential for serious side effects to an unborn child. Talk to your health care professional or pharmacist for more information. Do not breast-feed an infant while taking this medicine. This medicine has caused ovarian failure in some women. This medicine may interfere with the ability to have a child. You should talk to your doctor or health care professional if you are concerned about your fertility. What side effects may I notice from receiving this medicine? Side effects that you should report to your doctor or health care professional as soon as possible: -allergic reactions like skin rash, itching or hives, swelling of the face, lips, or tongue -signs of infection - fever or chills, cough, sore throat, pain or trouble passing urine -signs of decreased platelets or bleeding - bruising, pinpoint red spots on the skin, black, tarry stools, nosebleeds, blood in the urine -breathing problems -changes in vision -chest pain -confusion -jaw pain, especially after dental work -mouth sores -seizures -severe abdominal pain -severe headache -sudden numbness or weakness of the face, arm or leg -swelling of legs or ankles -symptoms of a stroke: change in mental awareness, inability to talk or move one side of the body (especially in patients with lung cancer) -trouble passing urine or change in the amount of urine -trouble speaking or understanding -trouble walking, dizziness, loss of balance or coordination Side effects that usually do not require medical attention (report to your doctor or health care professional if they continue or are bothersome): -constipation -diarrhea -dry skin -headache -loss of  appetite -nausea, vomiting This list may not describe all possible side effects. Call your doctor for medical advice about side effects. You may report side effects to FDA at 1-800-FDA-1088. Where should I keep my medicine? This drug is  given in a hospital or clinic and will not be stored at home. NOTE: This sheet is a summary. It may not cover all possible information. If you have questions about this medicine, talk to your doctor, pharmacist, or health care provider.  2014, Elsevier/Gold Standard. (2010-02-06 16:25:37)

## 2013-07-02 ENCOUNTER — Encounter (HOSPITAL_COMMUNITY): Payer: Self-pay | Admitting: Hematology and Oncology

## 2013-07-02 ENCOUNTER — Ambulatory Visit (HOSPITAL_COMMUNITY): Payer: Medicare Other

## 2013-07-02 ENCOUNTER — Telehealth (HOSPITAL_COMMUNITY): Payer: Self-pay | Admitting: Oncology

## 2013-07-02 ENCOUNTER — Ambulatory Visit (HOSPITAL_COMMUNITY)
Admission: RE | Admit: 2013-07-02 | Discharge: 2013-07-02 | Disposition: A | Discharge status: Home / Self Care | Payer: Medicare Other | Source: Ambulatory Visit | Attending: Oncology | Admitting: Oncology

## 2013-07-02 ENCOUNTER — Other Ambulatory Visit (HOSPITAL_COMMUNITY): Payer: Self-pay | Admitting: Oncology

## 2013-07-02 ENCOUNTER — Other Ambulatory Visit (HOSPITAL_COMMUNITY): Payer: Medicare Other

## 2013-07-02 ENCOUNTER — Encounter (HOSPITAL_COMMUNITY): Payer: Self-pay | Admitting: Oncology

## 2013-07-02 DIAGNOSIS — R599 Enlarged lymph nodes, unspecified: Secondary | ICD-10-CM | POA: Insufficient documentation

## 2013-07-02 DIAGNOSIS — C20 Malignant neoplasm of rectum: Secondary | ICD-10-CM

## 2013-07-02 DIAGNOSIS — K7689 Other specified diseases of liver: Secondary | ICD-10-CM | POA: Insufficient documentation

## 2013-07-02 DIAGNOSIS — I10 Essential (primary) hypertension: Secondary | ICD-10-CM | POA: Insufficient documentation

## 2013-07-02 DIAGNOSIS — Z87891 Personal history of nicotine dependence: Secondary | ICD-10-CM | POA: Insufficient documentation

## 2013-07-02 DIAGNOSIS — C7800 Secondary malignant neoplasm of unspecified lung: Secondary | ICD-10-CM | POA: Insufficient documentation

## 2013-07-02 DIAGNOSIS — E785 Hyperlipidemia, unspecified: Secondary | ICD-10-CM | POA: Insufficient documentation

## 2013-07-02 DIAGNOSIS — K8020 Calculus of gallbladder without cholecystitis without obstruction: Secondary | ICD-10-CM | POA: Insufficient documentation

## 2013-07-02 DIAGNOSIS — C7931 Secondary malignant neoplasm of brain: Secondary | ICD-10-CM

## 2013-07-02 HISTORY — DX: Secondary malignant neoplasm of brain: C79.31

## 2013-07-02 MED ORDER — OXYCODONE HCL 5 MG PO TABS: 5.0000 mg | ORAL_TABLET | ORAL | Status: DC | PRN

## 2013-07-02 MED ORDER — MORPHINE SULFATE ER 30 MG PO TBCR: 30.0000 mg | EXTENDED_RELEASE_TABLET | Freq: Two times a day (BID) | ORAL | Status: DC

## 2013-07-02 MED ORDER — IOHEXOL 300 MG/ML  SOLN
80.0000 mL | Freq: Once | INTRAMUSCULAR | Status: AC | PRN
  Administered 2013-07-02: 80 mL via INTRAVENOUS

## 2013-07-02 MED ORDER — DEXAMETHASONE 4 MG PO TABS: 8.0000 mg | ORAL_TABLET | Freq: Two times a day (BID) | ORAL | Status: DC

## 2013-07-02 NOTE — Telephone Encounter (Signed)
I personally reviewed and went over radiographic studies with the patient's sister.  The results are noted within this dictation.    I have discussed the case with Dr. Sondra Come ([facility]).  I have escribed Dex 8 mg BID to his pharmacy and he can start this when he gets it.   I have sent a referral to our scheduler.  [facility] 07/02/2013

## 2013-07-03 ENCOUNTER — Encounter (HOSPITAL_COMMUNITY): Payer: Medicare Other

## 2013-07-03 ENCOUNTER — Encounter (HOSPITAL_BASED_OUTPATIENT_CLINIC_OR_DEPARTMENT_OTHER): Payer: Medicare Other

## 2013-07-03 VITALS — BP 171/91 | HR 95 | Temp 97.3°F | Resp 16 | Wt 185.8 lb

## 2013-07-03 DIAGNOSIS — C20 Malignant neoplasm of rectum: Secondary | ICD-10-CM

## 2013-07-03 DIAGNOSIS — Z5112 Encounter for antineoplastic immunotherapy: Secondary | ICD-10-CM

## 2013-07-03 DIAGNOSIS — C7800 Secondary malignant neoplasm of unspecified lung: Secondary | ICD-10-CM

## 2013-07-03 DIAGNOSIS — Z5111 Encounter for antineoplastic chemotherapy: Secondary | ICD-10-CM

## 2013-07-03 LAB — COMPREHENSIVE METABOLIC PANEL
ALK PHOS: 96 U/L (ref 39–117)
ALT: 9 U/L (ref 0–53)
AST: 14 U/L (ref 0–37)
Albumin: 2.9 g/dL — ABNORMAL LOW (ref 3.5–5.2)
BILIRUBIN TOTAL: 0.2 mg/dL — AB (ref 0.3–1.2)
BUN: 11 mg/dL (ref 6–23)
CHLORIDE: 97 meq/L (ref 96–112)
CO2: 26 mEq/L (ref 19–32)
Calcium: 9.3 mg/dL (ref 8.4–10.5)
Creatinine, Ser: 1.18 mg/dL (ref 0.50–1.35)
GFR calc non Af Amer: 67 mL/min — ABNORMAL LOW (ref >90)
GFR, EST AFRICAN AMERICAN: 78 mL/min — AB (ref >90)
GLUCOSE: 185 mg/dL — AB (ref 70–99)
POTASSIUM: 4 meq/L (ref 3.7–5.3)
Sodium: 138 mEq/L (ref 137–147)
Total Protein: 6.9 g/dL (ref 6.0–8.3)

## 2013-07-03 LAB — CBC WITH DIFFERENTIAL/PLATELET
Basophils Absolute: 0 10*3/uL (ref 0.0–0.1)
Basophils Relative: 0 % (ref 0–1)
Eosinophils Absolute: 0 10*3/uL (ref 0.0–0.7)
Eosinophils Relative: 0 % (ref 0–5)
HCT: 35.1 % — ABNORMAL LOW (ref 39.0–52.0)
HEMOGLOBIN: 11.7 g/dL — AB (ref 13.0–17.0)
Lymphocytes Relative: 8 % — ABNORMAL LOW (ref 12–46)
Lymphs Abs: 0.7 10*3/uL (ref 0.7–4.0)
MCH: 29.8 pg (ref 26.0–34.0)
MCHC: 33.3 g/dL (ref 30.0–36.0)
MCV: 89.5 fL (ref 78.0–100.0)
MONOS PCT: 6 % (ref 3–12)
Monocytes Absolute: 0.5 10*3/uL (ref 0.1–1.0)
NEUTROS ABS: 7.6 10*3/uL (ref 1.7–7.7)
NEUTROS PCT: 86 % — AB (ref 43–77)
Platelets: 462 10*3/uL — ABNORMAL HIGH (ref 150–400)
RBC: 3.92 MIL/uL — ABNORMAL LOW (ref 4.22–5.81)
RDW: 14.1 % (ref 11.5–15.5)
WBC: 8.9 10*3/uL (ref 4.0–10.5)

## 2013-07-03 LAB — URINALYSIS, DIPSTICK ONLY
Bilirubin Urine: NEGATIVE
Glucose, UA: NEGATIVE mg/dL
Hgb urine dipstick: NEGATIVE
Ketones, ur: NEGATIVE mg/dL
LEUKOCYTES UA: NEGATIVE
Nitrite: NEGATIVE
PROTEIN: 30 mg/dL — AB
Specific Gravity, Urine: 1.025 (ref 1.005–1.030)
Urobilinogen, UA: 0.2 mg/dL (ref 0.0–1.0)
pH: 5.5 (ref 5.0–8.0)

## 2013-07-03 LAB — CEA: CEA: 39.5 ng/mL — AB (ref 0.0–5.0)

## 2013-07-03 MED ORDER — FAMOTIDINE IN NACL 20-0.9 MG/50ML-% IV SOLN
20.0000 mg | Freq: Once | INTRAVENOUS | Status: AC | PRN
  Administered 2013-07-03: 20 mg via INTRAVENOUS

## 2013-07-03 MED ORDER — SODIUM CHLORIDE 0.9 % IV SOLN
2400.0000 mg/m2 | INTRAVENOUS | Status: DC
  Administered 2013-07-03: 5000 mg via INTRAVENOUS
  Filled 2013-07-03: qty 100

## 2013-07-03 MED ORDER — SODIUM CHLORIDE 0.9 % IV SOLN
Freq: Once | INTRAVENOUS | Status: AC
  Administered 2013-07-03: 11:00:00 via INTRAVENOUS

## 2013-07-03 MED ORDER — SODIUM CHLORIDE 0.9 % IV SOLN: 16.0000 mg | Freq: Once | INTRAVENOUS | Status: DC

## 2013-07-03 MED ORDER — LEUCOVORIN CALCIUM INJECTION 350 MG
400.0000 mg/m2 | Freq: Once | INTRAVENOUS | Status: AC
  Administered 2013-07-03: 836 mg via INTRAVENOUS
  Filled 2013-07-03: qty 41.8

## 2013-07-03 MED ORDER — SODIUM CHLORIDE 0.9 % IV SOLN
Freq: Once | INTRAVENOUS | Status: AC
  Administered 2013-07-03: 16 mg via INTRAVENOUS
  Filled 2013-07-03: qty 8

## 2013-07-03 MED ORDER — LORAZEPAM 2 MG/ML IJ SOLN
INTRAMUSCULAR | Status: AC
  Filled 2013-07-03: qty 1

## 2013-07-03 MED ORDER — SODIUM CHLORIDE 0.9 % IV SOLN
5.0000 mg/kg | Freq: Once | INTRAVENOUS | Status: AC
  Administered 2013-07-03: 425 mg via INTRAVENOUS
  Filled 2013-07-03: qty 17

## 2013-07-03 MED ORDER — DEXAMETHASONE SODIUM PHOSPHATE 10 MG/ML IJ SOLN: 20.0000 mg | Freq: Once | INTRAMUSCULAR | Status: DC

## 2013-07-03 MED ORDER — LORAZEPAM 2 MG/ML IJ SOLN
1.0000 mg | INTRAMUSCULAR | Status: AC
  Administered 2013-07-03: 1 mg via INTRAVENOUS

## 2013-07-03 MED ORDER — FLUOROURACIL CHEMO INJECTION 2.5 GM/50ML
400.0000 mg/m2 | Freq: Once | INTRAVENOUS | Status: AC
  Administered 2013-07-03: 850 mg via INTRAVENOUS
  Filled 2013-07-03: qty 17

## 2013-07-03 MED ORDER — DEXTROSE 5 % IV SOLN
180.0000 mg/m2 | Freq: Once | INTRAVENOUS | Status: AC
  Administered 2013-07-03: 376 mg via INTRAVENOUS
  Filled 2013-07-03: qty 6.27

## 2013-07-03 MED ORDER — SODIUM CHLORIDE 0.9 % IJ SOLN
10.0000 mL | INTRAMUSCULAR | Status: DC | PRN
  Administered 2013-07-03: 10 mL

## 2013-07-03 MED ORDER — ATROPINE SULFATE 1 MG/ML IJ SOLN
0.5000 mg | Freq: Once | INTRAMUSCULAR | Status: AC | PRN
  Administered 2013-07-03: 0.5 mg via INTRAVENOUS
  Filled 2013-07-03: qty 1

## 2013-07-03 NOTE — Progress Notes (Signed)
Tolerated all chemo well. 

## 2013-07-03 NOTE — Progress Notes (Unsigned)
Chemo teaching done and consent signed for 5FU, Irinotecan, Leucovorin, and Avastin. Infusystem papers copied & put in folder/faxed/put in scan basket. Distress screening done.

## 2013-07-03 NOTE — Progress Notes (Signed)
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  He was last treated with 5FU+Leucovorin + Avastin on 12/02/2012 before he went missing. Of note, he did react to Erbitux on 10/31/2012 and that was his last dose of that antibody. The patient lost his health insurance last year and he went missing from the clinic and follow-up as a result.   Patient presented after his sister scheduled an acute appointment with Dr. Kefalas due to: decreased appetite, left shoulder and temporal pain, nausea/vomiting dyspnea on exertion and hoarseness. months ago with symptoms of:   Tobacco/Marijuana/Snuff/ETOH use: 50-pack-year consumption-08/29/2009, No alcohol, No illicit Drug Use.   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  He was last treated with 5FU+Leucovorin + Avastin on 12/02/2012 before he went missing. Of note, he did react to Erbitux on 10/31/2012 and that was his last dose of that antibody. The patient lost his health insurance last year and he went missing from the clinic and follow-up as a result.   Signs/Symptoms  Weight changes, if any: Gained weight from 185 to 191  Respiratory complaints: None  Pain issues, if any: No pain  SAFETY ISSUES:  Prior radiation? 8 years ago - to his Abdomen  Pacemaker/ICD? NO  Possible current pregnancy? NO  Is the patient on methotrexate? No  Current Complaints / other details: Chemotherapy continuous infusion  presently       

## 2013-07-04 ENCOUNTER — Encounter: Payer: Self-pay | Admitting: Radiation Oncology

## 2013-07-04 ENCOUNTER — Ambulatory Visit
Admission: RE | Admit: 2013-07-04 | Discharge: 2013-07-04 | Disposition: A | Discharge status: Home / Self Care | Payer: Medicare Other | Source: Ambulatory Visit | Attending: Radiation Oncology | Admitting: Radiation Oncology

## 2013-07-04 ENCOUNTER — Other Ambulatory Visit: Payer: Self-pay | Admitting: Radiation Therapy

## 2013-07-04 VITALS — BP 157/80 | HR 94 | Temp 97.8°F | Ht 73.0 in | Wt 191.0 lb

## 2013-07-04 DIAGNOSIS — C20 Malignant neoplasm of rectum: Secondary | ICD-10-CM | POA: Insufficient documentation

## 2013-07-04 DIAGNOSIS — C7931 Secondary malignant neoplasm of brain: Secondary | ICD-10-CM | POA: Insufficient documentation

## 2013-07-04 DIAGNOSIS — E785 Hyperlipidemia, unspecified: Secondary | ICD-10-CM | POA: Insufficient documentation

## 2013-07-04 DIAGNOSIS — C7800 Secondary malignant neoplasm of unspecified lung: Secondary | ICD-10-CM | POA: Insufficient documentation

## 2013-07-04 DIAGNOSIS — Z951 Presence of aortocoronary bypass graft: Secondary | ICD-10-CM | POA: Insufficient documentation

## 2013-07-04 DIAGNOSIS — R51 Headache: Secondary | ICD-10-CM | POA: Insufficient documentation

## 2013-07-04 DIAGNOSIS — I10 Essential (primary) hypertension: Secondary | ICD-10-CM | POA: Insufficient documentation

## 2013-07-04 DIAGNOSIS — C7949 Secondary malignant neoplasm of other parts of nervous system: Principal | ICD-10-CM

## 2013-07-04 DIAGNOSIS — Z510 Encounter for antineoplastic radiation therapy: Secondary | ICD-10-CM | POA: Insufficient documentation

## 2013-07-04 DIAGNOSIS — Z87891 Personal history of nicotine dependence: Secondary | ICD-10-CM | POA: Insufficient documentation

## 2013-07-04 DIAGNOSIS — Z79899 Other long term (current) drug therapy: Secondary | ICD-10-CM | POA: Insufficient documentation

## 2013-07-04 DIAGNOSIS — Z7982 Long term (current) use of aspirin: Secondary | ICD-10-CM | POA: Insufficient documentation

## 2013-07-04 DIAGNOSIS — I2510 Atherosclerotic heart disease of native coronary artery without angina pectoris: Secondary | ICD-10-CM | POA: Insufficient documentation

## 2013-07-04 DIAGNOSIS — M25519 Pain in unspecified shoulder: Secondary | ICD-10-CM | POA: Insufficient documentation

## 2013-07-04 NOTE — Addendum Note (Signed)
Encounter addended by: Jerriah Ines Mintz Tara Wich, RN on: 07/04/2013  4:36 PM<BR>     Documentation filed: Charges VN, Visit Diagnoses

## 2013-07-04 NOTE — Progress Notes (Signed)
Radiation Oncology         [phone] ________________________________  Initial outpatient Consultation  Name: Martin Maldonado MRN: [id number]  Date: 07/04/2013  DOB: May 06, 1955  OE:UMPN,TIRWER, MD  Farrel Gobble, MD   REFERRING PHYSICIAN: Farrel Gobble, MD  DIAGNOSIS: The encounter diagnosis was Brain metastasis.  HISTORY OF PRESENT ILLNESS::Martin Maldonado is a 58 y.o. male who  reports that he was diagnosed about 9 years ago with rectal cancer. He reports that he received surgery first followed by chemotherapy and radiation. He received his radiation in [place]. He reports that about 5 years ago he developed lung metastases. He has received systemic therapy since then. Most recently, he started FOLFIRI plus Avastin under the care of Dr. Bishop Limbo and Kirby Crigler for systemic progression detected on CT scan, concentrated in the chest. The CT of his head was also ordered earlier this month due to temporal headaches. I've reviewed all of these images. His left temporal headaches that are episodic, occurring about twice a day. He reports that they feel like a butcher knife in the side of his head. At the same time as his left-sided temple temporal headaches he has left shoulder pain as well. He has diaphoresis during the headaches. He has been treating him with BC powder but most recently has been prescribed steroids and narcotics which have helped. He says that the symptoms started around September 2014. The CT scan demonstrated a right cerebellar metastasis. He has not had an MRI yet. He does have some nausea. He has gained weight recently.   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     PAST SURGICAL HISTORY: Past Surgical History  Procedure Laterality Date  . Irrigation and debridement sebaceous cyst    . Laparoscopic lysis intestinal adhesions  2009    With incidental appendectomy  . Coronary artery bypass graft  2011    2 vessel  . Abdominoperineal proctocolectomy  2008  . Portacath placement  2011  . Colostomy      FAMILY HISTORY: family history includes Cancer in his maternal grandmother and mother.  SOCIAL HISTORY:  reports that he quit smoking about 3 years ago. He has never used smokeless tobacco. He reports that he does not drink alcohol or use illicit drugs.  ALLERGIES: Erbitux  MEDICATIONS:  Current Outpatient Prescriptions  Medication Sig Dispense Refill  . albuterol (PROVENTIL HFA;VENTOLIN HFA) 108 (90 BASE) MCG/ACT inhaler Inhale 2 puffs into the lungs every 6 (six) hours as needed for wheezing or shortness of breath.  1 Inhaler  2  . amLODipine (NORVASC) 10 MG tablet Take 0.5 tablets (5 mg total) by mouth daily.  30 tablet  6  . aspirin 81 MG tablet Take 81 mg by mouth daily.        . Aspirin-Salicylamide-Caffeine (BC HEADACHE PO) Take 2 packets by mouth 4 (four) times daily as needed (headache).      Marland Kitchen dexamethasone (DECADRON) 4 MG tablet Take 2 tablets (8 mg total) by mouth 2 (two) times daily with a meal.  40 tablet  1  . lisinopril (PRINIVIL,ZESTRIL) 20 MG tablet Take 40 mg by mouth daily.      Marland Kitchen LORazepam (ATIVAN) 0.5 MG tablet Take 1 tablet (0.5 mg total) by mouth every 8 (eight) hours as needed (  nausea/vomiting.  Take PO or SL.).  45 tablet  2  . metoprolol (LOPRESSOR) 50 MG tablet Take 50 mg by mouth 3 (three) times daily.      Marland Kitchen morphine (MS CONTIN) 30 MG 12 hr tablet Take 1 tablet (30 mg total) by mouth every 12 (twelve) hours.  60 tablet  0  . multivitamin (THERAGRAN) per tablet Take 1 tablet by mouth daily.        Marland Kitchen oxyCODONE (OXY IR/ROXICODONE) 5 MG immediate release tablet Take 1 tablet (5 mg total) by mouth every 4 (four) hours as  needed for severe pain.  100 tablet  0  . pantoprazole (PROTONIX) 20 MG tablet Take 1 tablet (20 mg total) by mouth daily.  90 tablet  3  . potassium chloride (K-DUR) 10 MEQ tablet Take 1 tablet (10 mEq total) by mouth daily.  5 tablet  0  . prochlorperazine (COMPAZINE) 10 MG tablet Take 1 tablet (10 mg total) by mouth every 6 (six) hours as needed for nausea, vomiting or refractory nausea / vomiting.  45 tablet  2  . triamterene-hydrochlorothiazide (MAXZIDE-25) 37.5-25 MG per tablet Take 1 tablet by mouth daily.  90 tablet  3   No current facility-administered medications for this encounter.   Facility-Administered Medications Ordered in Other Encounters  Medication Dose Route Frequency Provider Last Rate Last Dose  . sodium chloride 0.9 % injection 10 mL  10 mL Intravenous PRN Baird Cancer, PA-C   10 mL at 09/19/12 1037    REVIEW OF SYSTEMS: as above   PHYSICAL EXAM:  height is 6\' 1"  (1.854 m) and weight is 191 lb (86.637 kg). His temperature is 97.8 F (36.6 C). His blood pressure is 157/80 and his pulse is 94.   General: Alert and oriented, in no acute distress HEENT: Head is normocephalic. Pupils are equally round and reactive to light. Extraocular movements are intact. Oropharynx is clear. Scalp is palpated and there is no tenderness over the scalp in the left temporal region. No hardened vessels to suggest temporal arteritis, NO THRUSH. Heart: Regular in rate and rhythm with no murmurs, rubs, or gallops. Chest: Clear to auscultation bilaterally, with no rhonchi, wheezes, or rales. Abdomen: Soft, nontender, nondistended, with no rigidity or guarding. Extremities: No cyanosis or edema. Skin: No concerning lesions. Musculoskeletal: symmetric strength and muscle tone throughout. Neurologic: Cranial nerves II through XII are grossly intact. No obvious focalities. Speech is fluent. Rapidly alternating movements intact. Coordination is intact. Ambulatory Psychiatric: Judgment and  insight are intact. Affect is appropriate.    LABORATORY DATA:  Lab Results  Component Value Date   WBC 8.9 07/03/2013   HGB 11.7* 07/03/2013   HCT 35.1* 07/03/2013   MCV 89.5 07/03/2013   PLT 462* 07/03/2013   CMP     Component Value Date/Time   NA 138 07/03/2013 0956   K 4.0 07/03/2013 0956   CL 97 07/03/2013 0956   CO2 26 07/03/2013 0956   GLUCOSE 185* 07/03/2013 0956   BUN 11 07/03/2013 0956   CREATININE 1.18 07/03/2013 0956   CALCIUM 9.3 07/03/2013 0956   PROT 6.9 07/03/2013 0956   ALBUMIN 2.9* 07/03/2013 0956   AST 14 07/03/2013 0956   ALT 9 07/03/2013 0956   ALKPHOS 96 07/03/2013 0956   BILITOT 0.2* 07/03/2013 0956   GFRNONAA 67* 07/03/2013 0956   GFRAA 78* 07/03/2013 0956         RADIOGRAPHY: Dg Chest 2 View  06/19/2013   CLINICAL DATA:  Cough and  congestion and hoarseness and shortness of breath for 3 weeks; known metastatic rectal malignancy.  EXAM: CHEST  2 VIEW  COMPARISON:  CT ANGIO CHEST AORTA W/CM & WO/CM dated 03/02/2013  FINDINGS: Since the previous study there has been interval increase in the size of the multiple bilateral pulmonary parenchymal masses. The lungs are well-expanded. There is no pleural effusion or pneumothorax. The cardiac silhouette is normal in size. The pulmonary vascularity is not engorged. The patient has undergone previous CABG. Hilar and mediastinal lymphadenopathy is present. The observed portions of the bony thorax exhibit no acute abnormalities.  IMPRESSION: There has been interval growth in the size of the bilateral metastatic pulmonary parenchymal masses. There is persistent hilar and mediastinal lymphadenopathy. There is no evidence of pneumonia nor CHF.   Electronically Signed   By: David  Martinique   On: 06/19/2013 14:36   Ct Head W Wo Contrast  07/02/2013   CLINICAL DATA:  Rectal carcinoma with dizziness and blurred vision  EXAM: CT HEAD WITHOUT AND WITH CONTRAST  TECHNIQUE: Contiguous axial images were obtained from the base of the skull through the  vertex without and with intravenous contrast  CONTRAST:  80 mL Omnipaque 300 nonionic  COMPARISON:  Noncontrast study March 02, 2013  FINDINGS: The ventricles are normal in size and configuration. There is now vasogenic edema in the lateral right cerebellum. Within this area of vasogenic edema, there is a mass with irregular rim enhancement measuring 1.5 x 1.5 cm. No other mass is appreciable on this study. There is no hemorrhage, extra-axial fluid, or midline shift. Elsewhere, gray-white compartments appear normal. No acute infarct is apparent. Mass  IMPRESSION: Mass with surrounding vasogenic edema measuring 1.5 x 1.5 cm in the lateral right cerebellum. This mass shows irregular rim enhancement. This finding is consistent with neoplasia, and in the setting of known rectal carcinoma most likely represents a metastatic focus. No other lesion identified.   Electronically Signed   By: Lowella Grip M.D.   On: 07/02/2013 10:02   Ct Chest W Contrast  07/02/2013   CLINICAL DATA:  Restaging metastatic rectal carcinoma, history of hypertension, hyperlipidemia, former smoker, alcohol abuse  EXAM: CT CHEST, ABDOMEN, AND PELVIS WITH CONTRAST  TECHNIQUE: Multidetector CT imaging of the chest, abdomen and pelvis was performed following the standard protocol during bolus administration of intravenous contrast. Sagittal and coronal MPR images reconstructed from axial data set.  CONTRAST:  43mL OMNIPAQUE IOHEXOL 300 MG/ML SOLN IV. Dilute oral contrast.  COMPARISON:  CT chest 03/02/2013, CT chest abdomen and pelvis 10/17/2012  FINDINGS: CT CHEST FINDINGS  Vascular structures grossly patent on nondedicated exam.  Mediastinal and bilateral hilar adenopathy increased since previous study.  AP window lymph node 17 mm short axis image 25 unchanged.  Precarinal lymph node 20 mm short axis image 24, previously 7 mm.  Prevascular lymph node 16 mm short axis image 23, previously 6 mm.  Right hilar node at 24 mm image 29,  previously 12 mm.  Postsurgical atherosclerotic disease with postsurgical changes of CABG.  Pulmonary metastases increased in sizes and number since previous study.  Index lesion superior segment right lower lobe 5.3 x 4.0 cm image 35 previously 4.2 x 3.4 cm.  Index lesion left lower lobe 5.2 x 3.9 cm image 41, previously 4.5 x 3.4 cm.  Left pleural metastases with moderate-sized left pleural effusion increased since previous exam.  Minimal pericardial fluid and nodularity question metastatic disease.  No osseous metastases.  CT ABDOMEN AND PELVIS FINDINGS  9  mm nodule right lobe liver image 66 unchanged.  Tiny calcified granuloma within liver.  Tiny calcified gallstone.  Liver, spleen, pancreas, kidneys, and adrenal glands normal.  Lax anterior abdominal wall fascia with small supraumbilical hernia containing fat  Left lower quadrant descending colostomy post APR.  Scattered atherosclerotic calcifications.  Unremarkable bladder and prostate.  Stomach decompressed, unable to assess wall thickness.  Large and small bowel loops otherwise unremarkable.  Minimally enlarged left para-aortic lymph node 11 mm short axis image 75, new.  No mass, additional adenopathy, free fluid, free air, or acute osseous findings.  IMPRESSION: Progression of pulmonary metastatic disease and thoracic adenopathy since previous study.  New minimally enlarged left para-aortic lymph node.  Cholelithiasis.  Stable low-attenuation right lobe liver lesion.   Electronically Signed   By: Lavonia Dana M.D.   On: 07/02/2013 09:59   Ct Abdomen Pelvis W Contrast  07/02/2013   CLINICAL DATA:  Restaging metastatic rectal carcinoma, history of hypertension, hyperlipidemia, former smoker, alcohol abuse  EXAM: CT CHEST, ABDOMEN, AND PELVIS WITH CONTRAST  TECHNIQUE: Multidetector CT imaging of the chest, abdomen and pelvis was performed following the standard protocol during bolus administration of intravenous contrast. Sagittal and coronal MPR images  reconstructed from axial data set.  CONTRAST:  74mL OMNIPAQUE IOHEXOL 300 MG/ML SOLN IV. Dilute oral contrast.  COMPARISON:  CT chest 03/02/2013, CT chest abdomen and pelvis 10/17/2012  FINDINGS: CT CHEST FINDINGS  Vascular structures grossly patent on nondedicated exam.  Mediastinal and bilateral hilar adenopathy increased since previous study.  AP window lymph node 17 mm short axis image 25 unchanged.  Precarinal lymph node 20 mm short axis image 24, previously 7 mm.  Prevascular lymph node 16 mm short axis image 23, previously 6 mm.  Right hilar node at 24 mm image 29, previously 12 mm.  Postsurgical atherosclerotic disease with postsurgical changes of CABG.  Pulmonary metastases increased in sizes and number since previous study.  Index lesion superior segment right lower lobe 5.3 x 4.0 cm image 35 previously 4.2 x 3.4 cm.  Index lesion left lower lobe 5.2 x 3.9 cm image 41, previously 4.5 x 3.4 cm.  Left pleural metastases with moderate-sized left pleural effusion increased since previous exam.  Minimal pericardial fluid and nodularity question metastatic disease.  No osseous metastases.  CT ABDOMEN AND PELVIS FINDINGS  9 mm nodule right lobe liver image 66 unchanged.  Tiny calcified granuloma within liver.  Tiny calcified gallstone.  Liver, spleen, pancreas, kidneys, and adrenal glands normal.  Lax anterior abdominal wall fascia with small supraumbilical hernia containing fat  Left lower quadrant descending colostomy post APR.  Scattered atherosclerotic calcifications.  Unremarkable bladder and prostate.  Stomach decompressed, unable to assess wall thickness.  Large and small bowel loops otherwise unremarkable.  Minimally enlarged left para-aortic lymph node 11 mm short axis image 75, new.  No mass, additional adenopathy, free fluid, free air, or acute osseous findings.  IMPRESSION: Progression of pulmonary metastatic disease and thoracic adenopathy since previous study.  New minimally enlarged left  para-aortic lymph node.  Cholelithiasis.  Stable low-attenuation right lobe liver lesion.   Electronically Signed   By: Lavonia Dana M.D.   On: 07/02/2013 09:59      IMPRESSION/PLAN: This is a very pleasant 58 year old man with metastatic disease to the brain. He is here today with his sister.  I had a lengthy discussion with the patient after reviewing his CT images/results with them.  We will need a 3T MRI to  rule out/in other metastases.  If he demonstrates a limited # of metastases on this, he will likely be a good candidate for stereotactic radiosurgery; the alternative being whole brain RT.  We spoke about whole brain radiotherapy versus stereotactic radiosurgery to the brain. We spoke about the differing risks benefits and side effects of both of these treatments. During part of our discussion, we spoke about the hair loss, fatigue and cognitive effects that can result from whole brain radiotherapy.  Additionally, we spoke about side effects that can result from stereotactic radiosurgery. I explained that whole brain radiotherapy is more comprehensive and therefore can decrease the chance of recurrences elsewhere in the brain, while stereotactic radiosurgery only treats the areas of gross disease while sparing the rest of the brain parenchyma.  After lengthy discussion, the patient would like to proceed with stereotactic brain radiosurgery to their metastatic disease if his MRI shows limited brain disease.  If a good candidate for [facility], he will meet with neurosurgery in the near future to discuss this further; a neurosurgeon will participate in his case.  He would like to plan his MRI and treatment around chemotherapy infusions. We will arrange the MRI and simulation to take place on 07-16-13.  If he needs whole brain RT, we will move the simulation to take place in [place], [place], closer to his home. He is pleased with this plan. Consent to be signed once disposition is finalized. Until then he will  continue Dexamethasone.     __________________________________________   Eppie Gibson, MD

## 2013-07-05 ENCOUNTER — Encounter (HOSPITAL_BASED_OUTPATIENT_CLINIC_OR_DEPARTMENT_OTHER): Payer: Medicare Other

## 2013-07-05 VITALS — BP 151/77 | HR 88 | Temp 97.5°F | Resp 16

## 2013-07-05 DIAGNOSIS — C20 Malignant neoplasm of rectum: Secondary | ICD-10-CM

## 2013-07-05 MED ORDER — HEPARIN SOD (PORK) LOCK FLUSH 100 UNIT/ML IV SOLN
500.0000 [IU] | Freq: Once | INTRAVENOUS | Status: AC | PRN
  Administered 2013-07-05: 500 [IU]

## 2013-07-05 MED ORDER — HEPARIN SOD (PORK) LOCK FLUSH 100 UNIT/ML IV SOLN
INTRAVENOUS | Status: AC
  Filled 2013-07-05: qty 5

## 2013-07-05 MED ORDER — SODIUM CHLORIDE 0.9 % IJ SOLN
10.0000 mL | INTRAMUSCULAR | Status: DC | PRN
  Administered 2013-07-05: 10 mL

## 2013-07-05 NOTE — Progress Notes (Signed)
D/C continuous infusion pump.flushed port per protocol. De-accessed port. Patient reports 1 episode nausea yesterday relieved with anti-emetic. Reports appetite has been much better. Denies any episodes sweating. Denies any head pain yesterday, reports 1 episode head pain today relieved with medication. RTC as scheduled for next chemo. Report any issues/concerns to clinic as needed prior to appointment.

## 2013-07-11 NOTE — Addendum Note (Signed)
Encounter addended by: Deirdre Evener, RN on: 07/11/2013  1:17 PM<BR>     Documentation filed: Charges VN

## 2013-07-11 NOTE — Progress Notes (Signed)
No show

## 2013-07-12 ENCOUNTER — Ambulatory Visit (HOSPITAL_COMMUNITY): Payer: Medicare Other | Admitting: Oncology

## 2013-07-12 ENCOUNTER — Telehealth (HOSPITAL_COMMUNITY): Payer: Self-pay | Admitting: Oncology

## 2013-07-12 NOTE — Telephone Encounter (Signed)
Tyler will not be coming in today in 30 minutes for his appointment as he is not aware of the appointment (it is on his appointment list).   His sister called naming a number of complaints: 1. Constipation I have recommended daily dulcolax.  When constipated, use MiraLax or MOM to maintain bowel movements. 2. Nausea- this could be chemotherapy-induced, malignancy-induced, or constipation-induced.  I have recommend Zofran every 8 hours scheduled as Tevyn reports this works the best for him.  Karna Christmas is educated that Zofran can exacerbate constipation.  Be mindful of that. 3. Rib pain- I recommended Ibuprofen or Aleve for this pain.  He can additionally take opoid pain medication as prescribed for the pain.  He will return as scheduled.  Baird Cancer 07/12/2013

## 2013-07-12 NOTE — Progress Notes (Signed)
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  He was last treated with 5FU+Leucovorin + Avastin on 12/02/2012 before he went missing. Of note, he did react to Erbitux on 10/31/2012 and that was his last dose of that antibody. The patient lost his health insurance last year and he went missing from the clinic and follow-up as a result.   Patient presented after his sister scheduled an acute appointment with Dr. Sheldon Silvan due to: decreased appetite, left shoulder and temporal pain, nausea/vomiting dyspnea on exertion and hoarseness. months ago with symptoms of:   Tobacco/Marijuana/Snuff/ETOH use: 50-pack-year consumption-08/29/2009, No alcohol, No illicit Drug Use.   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  He was last treated with 5FU+Leucovorin + Avastin on 12/02/2012 before he went missing. Of note, he did react to Erbitux on 10/31/2012 and that was his last dose of that antibody. The patient lost his health insurance last year and he went missing from the clinic and follow-up as a result.   Signs/Symptoms  Weight changes, if any: Gained weight from 185 to 191  Respiratory complaints: None  Pain issues, if any: No pain  SAFETY ISSUES:  Prior radiation? 8 years ago - to his Abdomen  Pacemaker/ICD? NO  Possible current pregnancy? NO  Is the patient on methotrexate? No  Current Complaints / other details: Chemotherapy continuous infusion  presently

## 2013-07-13 ENCOUNTER — Telehealth: Payer: Self-pay | Admitting: *Deleted

## 2013-07-13 NOTE — Telephone Encounter (Signed)
Called to remind Martin Maldonado of his visit today and spoke with his sister Martin Maldonado, who is listed as a contact.  She reports that since his last chemo regimen which ended on last Thursday, that he has ben in bed with continued nausea.  She contacted, Dr. Daylene Katayama today and his note is added.  His sister called naming a number of complaints:  1. Constipation I have recommended daily dulcolax. When constipated, use MiraLax or MOM to maintain bowel movements.  2. Nausea- this could be chemotherapy-induced, malignancy-induced, or constipation-induced. I have recommend Zofran every 8 hours scheduled as [name] reports this works the best for him. Martin Maldonado is educated that Zofran can exacerbate constipation. Be mindful of that.  3. Rib pain- I recommended Ibuprofen or Aleve for this pain. He can additionally take opoid pain medication as prescribed for the pain.  He will return as scheduled.   His sister states that if he is not any better on Monday, that she will call.

## 2013-07-16 ENCOUNTER — Ambulatory Visit
Admission: RE | Admit: 2013-07-16 | Discharge: 2013-07-16 | Disposition: A | Discharge status: Home / Self Care | Payer: Medicare Other | Source: Ambulatory Visit | Attending: Radiation Oncology | Admitting: Radiation Oncology

## 2013-07-16 VITALS — BP 133/89 | HR 111 | Temp 98.5°F | Ht 73.0 in | Wt 171.5 lb

## 2013-07-16 DIAGNOSIS — C7949 Secondary malignant neoplasm of other parts of nervous system: Principal | ICD-10-CM

## 2013-07-16 DIAGNOSIS — C7931 Secondary malignant neoplasm of brain: Secondary | ICD-10-CM

## 2013-07-16 MED ORDER — GADOBENATE DIMEGLUMINE 529 MG/ML IV SOLN
17.0000 mL | Freq: Once | INTRAVENOUS | Status: AC | PRN
  Administered 2013-07-16: 17 mL via INTRAVENOUS

## 2013-07-16 NOTE — Progress Notes (Signed)
Martin Maldonado here for follow up new.  He denies pain.  He finished his chemotherapy infusing on Thursday and has had nausea and diarrhea.  He is taking ativan and compazine prn for the nausea and Imodium for the diarrhea.  He reports having 2-3 loose stools per day.  He is feeling very weak.  He is drinking a lot of fluids but not eating .  He has lost 20 lbs since 07/04/13.  Orthostatic vitals done: bp sitting 133/89, hr 111, bp standing 119/84 and hr 114.

## 2013-07-16 NOTE — Progress Notes (Signed)
  Radiation Oncology         [phone] ________________________________  Name: Martin Maldonado MRN: [id number]  Date: 07/16/2013  DOB: 1955/09/18  SIMULATION AND TREATMENT PLANNING NOTE OUTPATIENT  DIAGNOSIS:  Right Cerebellar Brain metastasis  NARRATIVE:  The patient was brought to the [facility] suite.  Identity was confirmed.  All relevant records and images related to the planned course of therapy were reviewed.  The patient freely provided informed written consent to proceed with treatment after reviewing the details related to the planned course of therapy. The consent form was witnessed and verified by the simulation staff. Intravenous access was established for contrast administration. Then, the patient was set-up in a stable reproducible supine position for radiation therapy.  A relocatable thermoplastic stereotactic head frame was fabricated for precise immobilization.  CT images were obtained. NO CONTRAST WAS GIVEN DUE TO RECENT NAUSEA/DIARRHEA. Patient will be seen by med/onc tomorrow for possible IV fluids. Surface markings were placed.  The CT images were loaded into the planning software and fused with the patient's targeting MRI scan.  Then the target and avoidance structures were contoured.  Treatment planning then occurred.  The radiation prescription was entered and confirmed.  I have requested 3D planning and dose calc's.  I have requested a DVH of the following structures: Brain stem, brain, left eye, right eye, lenses, optic chiasm, target volumes, uninvolved brain, and normal tissue.    PLAN:  The patient will receive 20 Gy in 1 fraction by SRS to the right cerebellar metastasis.  -----------------------------------  Eppie Gibson, MD

## 2013-07-16 NOTE — Progress Notes (Signed)
Radiation Oncology         [phone] ________________________________  Name: Martin Maldonado MRN: [id number]  Date: 07/16/2013  DOB: [birth year]  Follow-Up Visit Note  outpatient  CC: [name], MD  [name], MD  Diagnosis and Prior Radiotherapy:   Rectal Cancer, Stage IV, brain metastasis of right cerebellum  Narrative:  The patient returns today for routine follow-up.  He denies pain. He finished his chemotherapy infusing on Thursday and has had nausea and diarrhea. He is taking ativan and compazine prn for the nausea and Imodium for the diarrhea. He reports having 2-3 loose stools per day. He is feeling very weak. He is drinking a lot of fluids but not eating . He has lost 20 lbs since 07/04/13. Orthostatic vitals done: bp sitting 133/89, hr 111, bp standing 119/84 and hr 114.  He sees Dr Barnet Glasgow tomorrow. MRI of Brain today shows a single brain metastasis, no new lesions noted.     ALLERGIES:  is allergic to erbitux.  Meds: Current Outpatient Prescriptions  Medication Sig Dispense Refill  . albuterol (PROVENTIL HFA;VENTOLIN HFA) 108 (90 BASE) MCG/ACT inhaler Inhale 2 puffs into the lungs every 6 (six) hours as needed for wheezing or shortness of breath.  1 Inhaler  2  . amLODipine (NORVASC) 10 MG tablet Take 0.5 tablets (5 mg total) by mouth daily.  30 tablet  6  . lisinopril (PRINIVIL,ZESTRIL) 20 MG tablet Take 40 mg by mouth daily.      Marland Kitchen loperamide (IMODIUM) 2 MG capsule Take 2 mg by mouth as needed for diarrhea or loose stools.      Marland Kitchen LORazepam (ATIVAN) 0.5 MG tablet Take 1 tablet (0.5 mg total) by mouth every 8 (eight) hours as needed (nausea/vomiting.  Take PO or SL.).  45 tablet  2  . metoprolol (LOPRESSOR) 50 MG tablet Take 50 mg by mouth 3 (three) times daily.      . multivitamin (THERAGRAN) per tablet Take 1 tablet by mouth daily.        . prochlorperazine (COMPAZINE) 10 MG tablet Take 1 tablet (10 mg total) by mouth every 6 (six) hours as needed for nausea, vomiting  or refractory nausea / vomiting.  45 tablet  2  . aspirin 81 MG tablet Take 81 mg by mouth daily.        . Aspirin-Salicylamide-Caffeine (BC HEADACHE PO) Take 2 packets by mouth 4 (four) times daily as needed (headache).      Marland Kitchen dexamethasone (DECADRON) 4 MG tablet Take 2 tablets (8 mg total) by mouth 2 (two) times daily with a meal.  40 tablet  1  . morphine (MS CONTIN) 30 MG 12 hr tablet Take 1 tablet (30 mg total) by mouth every 12 (twelve) hours.  60 tablet  0  . oxyCODONE (OXY IR/ROXICODONE) 5 MG immediate release tablet Take 1 tablet (5 mg total) by mouth every 4 (four) hours as needed for severe pain.  100 tablet  0  . pantoprazole (PROTONIX) 20 MG tablet Take 1 tablet (20 mg total) by mouth daily.  90 tablet  3  . potassium chloride (K-DUR) 10 MEQ tablet Take 1 tablet (10 mEq total) by mouth daily.  5 tablet  0  . triamterene-hydrochlorothiazide (MAXZIDE-25) 37.5-25 MG per tablet Take 1 tablet by mouth daily.  90 tablet  3   No current facility-administered medications for this encounter.   Facility-Administered Medications Ordered in Other Encounters  Medication Dose Route Frequency Provider Last Rate Last Dose  . sodium  chloride 0.9 % injection 10 mL  10 mL Intravenous PRN Baird Cancer, PA-C   10 mL at 09/19/12 1037    Physical Findings: The patient is in no acute distress. Patient is alert and oriented.  height is 6\' 1"  (1.854 m) and weight is 171 lb 8 oz (77.792 kg). His temperature is 98.5 F (36.9 C). His blood pressure is 133/89 and his pulse is 111. His oxygen saturation is 100%. .   Tired appearing. Ambulatory. ECOG 1.   Lab Findings: Lab Results  Component Value Date   WBC 8.9 07/03/2013   HGB 11.7* 07/03/2013   HCT 35.1* 07/03/2013   MCV 89.5 07/03/2013   PLT 462* 07/03/2013    Radiographic Findings: Dg Chest 2 View  06/19/2013   CLINICAL DATA:  Cough and congestion and hoarseness and shortness of breath for 3 weeks; known metastatic rectal malignancy.  EXAM: CHEST   2 VIEW  COMPARISON:  CT ANGIO CHEST AORTA W/CM & WO/CM dated 03/02/2013  FINDINGS: Since the previous study there has been interval increase in the size of the multiple bilateral pulmonary parenchymal masses. The lungs are well-expanded. There is no pleural effusion or pneumothorax. The cardiac silhouette is normal in size. The pulmonary vascularity is not engorged. The patient has undergone previous CABG. Hilar and mediastinal lymphadenopathy is present. The observed portions of the bony thorax exhibit no acute abnormalities.  IMPRESSION: There has been interval growth in the size of the bilateral metastatic pulmonary parenchymal masses. There is persistent hilar and mediastinal lymphadenopathy. There is no evidence of pneumonia nor CHF.   Electronically Signed   By: David  Martinique   On: 06/19/2013 14:36   Ct Head W Wo Contrast  07/02/2013   CLINICAL DATA:  Rectal carcinoma with dizziness and blurred vision  EXAM: CT HEAD WITHOUT AND WITH CONTRAST  TECHNIQUE: Contiguous axial images were obtained from the base of the skull through the vertex without and with intravenous contrast  CONTRAST:  80 mL Omnipaque 300 nonionic  COMPARISON:  Noncontrast study March 02, 2013  FINDINGS: The ventricles are normal in size and configuration. There is now vasogenic edema in the lateral right cerebellum. Within this area of vasogenic edema, there is a mass with irregular rim enhancement measuring 1.5 x 1.5 cm. No other mass is appreciable on this study. There is no hemorrhage, extra-axial fluid, or midline shift. Elsewhere, gray-white compartments appear normal. No acute infarct is apparent. Mass  IMPRESSION: Mass with surrounding vasogenic edema measuring 1.5 x 1.5 cm in the lateral right cerebellum. This mass shows irregular rim enhancement. This finding is consistent with neoplasia, and in the setting of known rectal carcinoma most likely represents a metastatic focus. No other lesion identified.   Electronically Signed    By: Lowella Grip M.D.   On: 07/02/2013 10:02   Ct Chest W Contrast  07/02/2013   CLINICAL DATA:  Restaging metastatic rectal carcinoma, history of hypertension, hyperlipidemia, former smoker, alcohol abuse  EXAM: CT CHEST, ABDOMEN, AND PELVIS WITH CONTRAST  TECHNIQUE: Multidetector CT imaging of the chest, abdomen and pelvis was performed following the standard protocol during bolus administration of intravenous contrast. Sagittal and coronal MPR images reconstructed from axial data set.  CONTRAST:  4mL OMNIPAQUE IOHEXOL 300 MG/ML SOLN IV. Dilute oral contrast.  COMPARISON:  CT chest 03/02/2013, CT chest abdomen and pelvis 10/17/2012  FINDINGS: CT CHEST FINDINGS  Vascular structures grossly patent on nondedicated exam.  Mediastinal and bilateral hilar adenopathy increased since previous study.  AP  window lymph node 17 mm short axis image 25 unchanged.  Precarinal lymph node 20 mm short axis image 24, previously 7 mm.  Prevascular lymph node 16 mm short axis image 23, previously 6 mm.  Right hilar node at 24 mm image 29, previously 12 mm.  Postsurgical atherosclerotic disease with postsurgical changes of CABG.  Pulmonary metastases increased in sizes and number since previous study.  Index lesion superior segment right lower lobe 5.3 x 4.0 cm image 35 previously 4.2 x 3.4 cm.  Index lesion left lower lobe 5.2 x 3.9 cm image 41, previously 4.5 x 3.4 cm.  Left pleural metastases with moderate-sized left pleural effusion increased since previous exam.  Minimal pericardial fluid and nodularity question metastatic disease.  No osseous metastases.  CT ABDOMEN AND PELVIS FINDINGS  9 mm nodule right lobe liver image 66 unchanged.  Tiny calcified granuloma within liver.  Tiny calcified gallstone.  Liver, spleen, pancreas, kidneys, and adrenal glands normal.  Lax anterior abdominal wall fascia with small supraumbilical hernia containing fat  Left lower quadrant descending colostomy post APR.  Scattered atherosclerotic  calcifications.  Unremarkable bladder and prostate.  Stomach decompressed, unable to assess wall thickness.  Large and small bowel loops otherwise unremarkable.  Minimally enlarged left para-aortic lymph node 11 mm short axis image 75, new.  No mass, additional adenopathy, free fluid, free air, or acute osseous findings.  IMPRESSION: Progression of pulmonary metastatic disease and thoracic adenopathy since previous study.  New minimally enlarged left para-aortic lymph node.  Cholelithiasis.  Stable low-attenuation right lobe liver lesion.   Electronically Signed   By: Lavonia Dana M.D.   On: 07/02/2013 09:59   Mr Jeri Cos WP Contrast  07/16/2013   CLINICAL DATA:  Colon and renal cancer with metastatic disease to lung and brain lateral right cerebellar. Stereotactic radiation surgery planning exam.  EXAM: MRI HEAD WITHOUT AND WITH CONTRAST  TECHNIQUE: Multiplanar, multiecho pulse sequences of the brain and surrounding structures were obtained without and with intravenous contrast.  CONTRAST:  70mL MULTIHANCE GADOBENATE DIMEGLUMINE 529 MG/ML IV SOLN  COMPARISON:  07/02/2013 head CT.  No comparison brain MR.  FINDINGS: 1.2 x 1 x 0.9 cm enhancing lesion lateral aspect of the right cerebellum may contain blood breakdown products and represent hemorrhagic metastatic lesion from renal cell cancer. Minimal surrounding vasogenic edema.  No other parenchymal enhancing lesions. There are areas of artifact (most notable left internal auditory canal region and frontal lobe) but without other intracranial mass identified.  No acute infarct.  No intracranial hemorrhage separate from the right cerebellar lesion.  Mild small vessel disease type changes.  Atherosclerotic type changes right vertebral artery with narrowing. Remainder of major intracranial vascular structures are patent.  IMPRESSION: 1.2 x 1 x 0.9 cm hemorrhagic enhancing lesion lateral aspect of the right cerebellum may represent metastatic lesion from renal cell  cancer. Minimal surrounding vasogenic edema.  No other parenchymal enhancing lesions.  No acute infarct.  Mild small vessel disease type changes.  Atherosclerotic type changes right vertebral artery with narrowing.   Electronically Signed   By: Chauncey Cruel M.D.   On: 07/16/2013 12:18   Ct Abdomen Pelvis W Contrast  07/02/2013   CLINICAL DATA:  Restaging metastatic rectal carcinoma, history of hypertension, hyperlipidemia, former smoker, alcohol abuse  EXAM: CT CHEST, ABDOMEN, AND PELVIS WITH CONTRAST  TECHNIQUE: Multidetector CT imaging of the chest, abdomen and pelvis was performed following the standard protocol during bolus administration of intravenous contrast. Sagittal and coronal MPR images  reconstructed from axial data set.  CONTRAST:  85mL OMNIPAQUE IOHEXOL 300 MG/ML SOLN IV. Dilute oral contrast.  COMPARISON:  CT chest 03/02/2013, CT chest abdomen and pelvis 10/17/2012  FINDINGS: CT CHEST FINDINGS  Vascular structures grossly patent on nondedicated exam.  Mediastinal and bilateral hilar adenopathy increased since previous study.  AP window lymph node 17 mm short axis image 25 unchanged.  Precarinal lymph node 20 mm short axis image 24, previously 7 mm.  Prevascular lymph node 16 mm short axis image 23, previously 6 mm.  Right hilar node at 24 mm image 29, previously 12 mm.  Postsurgical atherosclerotic disease with postsurgical changes of CABG.  Pulmonary metastases increased in sizes and number since previous study.  Index lesion superior segment right lower lobe 5.3 x 4.0 cm image 35 previously 4.2 x 3.4 cm.  Index lesion left lower lobe 5.2 x 3.9 cm image 41, previously 4.5 x 3.4 cm.  Left pleural metastases with moderate-sized left pleural effusion increased since previous exam.  Minimal pericardial fluid and nodularity question metastatic disease.  No osseous metastases.  CT ABDOMEN AND PELVIS FINDINGS  9 mm nodule right lobe liver image 66 unchanged.  Tiny calcified granuloma within liver.  Tiny  calcified gallstone.  Liver, spleen, pancreas, kidneys, and adrenal glands normal.  Lax anterior abdominal wall fascia with small supraumbilical hernia containing fat  Left lower quadrant descending colostomy post APR.  Scattered atherosclerotic calcifications.  Unremarkable bladder and prostate.  Stomach decompressed, unable to assess wall thickness.  Large and small bowel loops otherwise unremarkable.  Minimally enlarged left para-aortic lymph node 11 mm short axis image 75, new.  No mass, additional adenopathy, free fluid, free air, or acute osseous findings.  IMPRESSION: Progression of pulmonary metastatic disease and thoracic adenopathy since previous study.  New minimally enlarged left para-aortic lymph node.  Cholelithiasis.  Stable low-attenuation right lobe liver lesion.   Electronically Signed   By: Lavonia Dana M.D.   On: 07/02/2013 09:59    Impression/Plan:  Now that the MRI confirms a single brain metastasis, he is a good candidate for stereotactic radiosurgery. Whole brain RT, which has been discussed, is something he would rather avoid or delay.  We discussed the option of surgery to the lesion followed by RT, but I suspect his neurosurgeon, whom he meets later this week, would not recommend surgery in light of his widespread visceral metastasis and the likelihood of excellent local control with SRS alone. He signed a consent form today, after discussing of potential acute effects (including fatigue/headache/discoordination) and late effects (radionecrosis / brain injury). I look forward to participating in his care.  We will therefore simulate him today for SRS to the right cerebellar metastasis.    I spent 15 minutes face to face with the patient and more than 50% of that time was spent in counseling and/or coordination of care. _____________________________________   Eppie Gibson, MD

## 2013-07-17 ENCOUNTER — Encounter (HOSPITAL_COMMUNITY): Payer: Self-pay

## 2013-07-17 ENCOUNTER — Other Ambulatory Visit (HOSPITAL_COMMUNITY): Payer: Self-pay | Admitting: *Deleted

## 2013-07-17 ENCOUNTER — Other Ambulatory Visit (HOSPITAL_COMMUNITY): Payer: Self-pay | Admitting: Oncology

## 2013-07-17 ENCOUNTER — Encounter (HOSPITAL_BASED_OUTPATIENT_CLINIC_OR_DEPARTMENT_OTHER): Payer: Medicare Other

## 2013-07-17 ENCOUNTER — Encounter: Payer: Self-pay | Admitting: Radiation Oncology

## 2013-07-17 VITALS — BP 139/89 | HR 79 | Temp 97.9°F | Resp 18 | Wt 169.8 lb

## 2013-07-17 DIAGNOSIS — E860 Dehydration: Secondary | ICD-10-CM

## 2013-07-17 DIAGNOSIS — C7931 Secondary malignant neoplasm of brain: Secondary | ICD-10-CM

## 2013-07-17 DIAGNOSIS — R197 Diarrhea, unspecified: Secondary | ICD-10-CM

## 2013-07-17 DIAGNOSIS — E876 Hypokalemia: Secondary | ICD-10-CM

## 2013-07-17 DIAGNOSIS — C20 Malignant neoplasm of rectum: Secondary | ICD-10-CM

## 2013-07-17 DIAGNOSIS — R112 Nausea with vomiting, unspecified: Secondary | ICD-10-CM

## 2013-07-17 DIAGNOSIS — K1230 Oral mucositis (ulcerative), unspecified: Secondary | ICD-10-CM

## 2013-07-17 DIAGNOSIS — C7800 Secondary malignant neoplasm of unspecified lung: Secondary | ICD-10-CM

## 2013-07-17 DIAGNOSIS — C7949 Secondary malignant neoplasm of other parts of nervous system: Secondary | ICD-10-CM

## 2013-07-17 DIAGNOSIS — K121 Other forms of stomatitis: Secondary | ICD-10-CM

## 2013-07-17 LAB — COMPREHENSIVE METABOLIC PANEL
ALBUMIN: 2.9 g/dL — AB (ref 3.5–5.2)
ALT: 10 U/L (ref 0–53)
AST: 13 U/L (ref 0–37)
Alkaline Phosphatase: 95 U/L (ref 39–117)
BILIRUBIN TOTAL: 0.4 mg/dL (ref 0.3–1.2)
BUN: 11 mg/dL (ref 6–23)
CHLORIDE: 87 meq/L — AB (ref 96–112)
CO2: 26 mEq/L (ref 19–32)
Calcium: 8.9 mg/dL (ref 8.4–10.5)
Creatinine, Ser: 1.22 mg/dL (ref 0.50–1.35)
GFR calc Af Amer: 75 mL/min — ABNORMAL LOW (ref >90)
GFR calc non Af Amer: 65 mL/min — ABNORMAL LOW (ref >90)
Glucose, Bld: 118 mg/dL — ABNORMAL HIGH (ref 70–99)
POTASSIUM: 3 meq/L — AB (ref 3.7–5.3)
SODIUM: 129 meq/L — AB (ref 137–147)
Total Protein: 6.5 g/dL (ref 6.0–8.3)

## 2013-07-17 LAB — CBC WITH DIFFERENTIAL/PLATELET
BASOS ABS: 0.1 10*3/uL (ref 0.0–0.1)
BASOS PCT: 1 % (ref 0–1)
Eosinophils Absolute: 0.1 10*3/uL (ref 0.0–0.7)
Eosinophils Relative: 2 % (ref 0–5)
HCT: 38 % — ABNORMAL LOW (ref 39.0–52.0)
Hemoglobin: 13.2 g/dL (ref 13.0–17.0)
Lymphocytes Relative: 20 % (ref 12–46)
Lymphs Abs: 1 10*3/uL (ref 0.7–4.0)
MCH: 30.1 pg (ref 26.0–34.0)
MCHC: 34.7 g/dL (ref 30.0–36.0)
MCV: 86.8 fL (ref 78.0–100.0)
Monocytes Absolute: 0.8 10*3/uL (ref 0.1–1.0)
Monocytes Relative: 15 % — ABNORMAL HIGH (ref 3–12)
NEUTROS ABS: 3.2 10*3/uL (ref 1.7–7.7)
NEUTROS PCT: 62 % (ref 43–77)
PLATELETS: 280 10*3/uL (ref 150–400)
RBC: 4.38 MIL/uL (ref 4.22–5.81)
RDW: 14 % (ref 11.5–15.5)
WBC: 5.1 10*3/uL (ref 4.0–10.5)

## 2013-07-17 LAB — URINALYSIS, DIPSTICK ONLY
BILIRUBIN URINE: NEGATIVE
Glucose, UA: NEGATIVE mg/dL
HGB URINE DIPSTICK: NEGATIVE
Ketones, ur: NEGATIVE mg/dL
Leukocytes, UA: NEGATIVE
Nitrite: NEGATIVE
Protein, ur: 30 mg/dL — AB
SPECIFIC GRAVITY, URINE: 1.02 (ref 1.005–1.030)
Urobilinogen, UA: 0.2 mg/dL (ref 0.0–1.0)
pH: 6 (ref 5.0–8.0)

## 2013-07-17 LAB — CEA: CEA: 33.9 ng/mL — ABNORMAL HIGH (ref 0.0–5.0)

## 2013-07-17 MED ORDER — DIPHENOXYLATE-ATROPINE 2.5-0.025 MG PO TABS: ORAL_TABLET | ORAL | Status: DC

## 2013-07-17 MED ORDER — OCTREOTIDE ACETATE 500 MCG/ML IJ SOLN
200.0000 ug | Freq: Once | INTRAMUSCULAR | Status: AC
  Administered 2013-07-17: 200 ug via SUBCUTANEOUS
  Filled 2013-07-17: qty 0.4

## 2013-07-17 MED ORDER — SODIUM CHLORIDE 0.9 % IV SOLN: 16.0000 mg | Freq: Once | INTRAVENOUS | Status: DC

## 2013-07-17 MED ORDER — SODIUM CHLORIDE 0.9 % IV SOLN
Freq: Once | INTRAVENOUS | Status: AC
  Administered 2013-07-17: 16 mg via INTRAVENOUS
  Filled 2013-07-17: qty 8

## 2013-07-17 MED ORDER — DEXAMETHASONE SODIUM PHOSPHATE 10 MG/ML IJ SOLN: 10.0000 mg | Freq: Once | INTRAMUSCULAR | Status: DC

## 2013-07-17 MED ORDER — HEPARIN SOD (PORK) LOCK FLUSH 100 UNIT/ML IV SOLN
500.0000 [IU] | Freq: Once | INTRAVENOUS | Status: AC
  Administered 2013-07-17: 500 [IU] via INTRAVENOUS
  Filled 2013-07-17: qty 5

## 2013-07-17 MED ORDER — LIDOCAINE VISCOUS 2 % MT SOLN: OROMUCOSAL | Status: DC

## 2013-07-17 MED ORDER — POTASSIUM CHLORIDE CRYS ER 20 MEQ PO TBCR: 20.0000 meq | EXTENDED_RELEASE_TABLET | Freq: Two times a day (BID) | ORAL | Status: DC

## 2013-07-17 MED ORDER — SODIUM CHLORIDE 0.9 % IV SOLN
Freq: Once | INTRAVENOUS | Status: AC
  Administered 2013-07-17: 10:00:00 via INTRAVENOUS

## 2013-07-17 MED ORDER — POTASSIUM CHLORIDE 20 MEQ/15ML (10%) PO LIQD
20.0000 meq | Freq: Every day | ORAL | Status: DC
  Administered 2013-07-17: 20 meq via ORAL
  Filled 2013-07-17 (×2): qty 30

## 2013-07-17 MED ORDER — DEXAMETHASONE 4 MG PO TABS: 8.0000 mg | ORAL_TABLET | Freq: Two times a day (BID) | ORAL | Status: DC

## 2013-07-17 NOTE — Progress Notes (Signed)
Martin Maldonado presents today for injection per MD orders. Sandostatin 200 mcg administered SQ in right Abdomen. Administration without incident. Patient tolerated well.

## 2013-07-17 NOTE — Patient Instructions (Signed)
[facility] Discharge Instructions  RECOMMENDATIONS MADE BY THE CONSULTANT AND ANY TEST RESULTS WILL BE SENT TO YOUR REFERRING PHYSICIAN.  EXAM FINDINGS BY THE PHYSICIAN TODAY AND SIGNS OR SYMPTOMS TO REPORT TO CLINIC OR PRIMARY PHYSICIAN: Exam and findings as discussed by Dr.Formanek.Marland Kitchen  MEDICATIONS PRESCRIBED:  Mucinex 2 x daily. Dexamethasone 8 mg 2 x daily. Lomotil as needed for diarrhea. Viscous lidocaine as needed for your mouth and throat. Potassium 20 meq 2 x daily. Pain medications as needed.  INSTRUCTIONS/FOLLOW-UP: Return to clinic in 1 week for MD appointment.  Thank you for choosing [facility] to provide your oncology and hematology care.  To afford each patient quality time with our providers, please arrive at least 15 minutes before your scheduled appointment time.  With your help, our goal is to use those 15 minutes to complete the necessary work-up to ensure our physicians have the information they need to help with your evaluation and healthcare recommendations.    Effective January 1st, 2014, we ask that you re-schedule your appointment with our physicians should you arrive 10 or more minutes late for your appointment.  We strive to give you quality time with our providers, and arriving late affects you and other patients whose appointments are after yours.    Again, thank you for choosing [facility].  Our hope is that these requests will decrease the amount of time that you wait before being seen by our physicians.       _____________________________________________________________  Should you have questions after your visit to [facility], please contact our office at [phone] between the hours of 8:30 a.m. and 5:00 p.m.  Voicemails left after 4:30 p.m. will not be returned until the following business day.  For prescription refill requests, have your pharmacy contact our office with your prescription  refill request.

## 2013-07-17 NOTE — Progress Notes (Signed)
[facility]  OFFICE PROGRESS NOTE  [name], MD [place] [address]  DIAGNOSIS: Rectal cancer - Plan: CBC with Differential, Comprehensive metabolic panel, CEA, Urinalysis, dipstick only  Nausea with vomiting - Plan: Miscellaneous test, Miscellaneous test  Brain metastasis - Plan: 0.9 %  sodium chloride infusion, potassium chloride 20 MEQ/15ML (10%) liquid 20 mEq, dexamethasone (DECADRON) 4 MG tablet, octreotide (SANDOSTATIN) injection 200 mcg  Chief Complaint  Patient presents with  . Persistent nausea and vomiting  . Severe diarrhea    CURRENT THERAPY: FOLFIRI plus Avastin, cycle 1 given on 07/05/2013.  INTERVAL HISTORY: Martin Maldonado 59 y.o. male returns for returns to continue with cycle 2 of FOLFIRI plus Avastin but was seen emergently because of severe constitutional symptoms.  He stopped dexamethasone one week ago. He also stopped opioids completely 2 days ago. He has had nausea without frank vomiting. Diarrhea has been profuse after taking a suppository 2 weeks ago to relieve severe constipation. He denies any headache but is dizzy when he tries to ambulate. He denies any fever, chills, but has had a pain in his mouth particularly around the gums. He denies any earache but has had excessive mucous involving the oral pharynx. He denies any hematemesis, melena, or hematochezia. The diarrhea is watery. He has tried Imodium at home without success using 2 every 4 hours.  MEDICAL HISTORY: Past Medical History  Diagnosis Date  . Hypertension   . Rectal cancer 2008    Adenocarcinoma; AP resection in 2008; recurrence with lung metastases in 2011  . Arteriosclerotic cardiovascular disease (ASCVD) 2011    CABG surgery in 2011  . Hyperlipidemia   . 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 Splenomegaly 01/23/2009    Additional GI history includes small bowel obstruction, requiring  laparotomy and lysis of adhesions in 2009, remote peptic ulcer disease and postoperative perirectal infection  . ALCOHOL ABUSE, HX OF 01/23/2009  . Brain metastasis 07/02/2013    INTERIM HISTORY: has HYPERLIPIDEMIA; Tobacco abuse, in remission; HYPERTENSION; COPD; SPLENOMEGALY; SMALL BOWEL OBSTRUCTION, HX OF; Rectal cancer; CAD (coronary artery disease) of artery bypass graft; and Brain metastasis on his problem list.   Rectal cancer    05/09/2006  Surgery  Rectum, abdomino-perineal resection demonstrating invasive adenocarcinoma of rectum, moderately differentiated, with invasion into the perirectal adipose tissue with positive margin in the perirectal soft tissue. 1/12 lymph nodes positive for disease    06/14/2006 - 09/28/2006  Chemotherapy  Oxaliplatin, Xeloda, and Avastin x 6 cycles    10/12/2006 - 10/26/2006  Chemotherapy  Avastin    06/10/2009  Cancer Staging  Lung biopsy positive for adenocarcinoma of colorectal origin    06/30/2009 - 01/06/2010  Chemotherapy  FOLFOX + Avastin x 13 cycles    08/10/2010 - 12/09/2010  Chemotherapy  FOLFIRI + Avastin x 8 cycles    05/04/2011 - 10/12/2011  Chemotherapy  Cetuximab + 5FU/Leucovorin x 12 cycles    01/24/2012 - 07/25/2012  Chemotherapy  Cetuximab + 5FU/Leucovorin x 13 cycles    10/31/2012  Adverse Reaction  Anaphylaxis to Cetuximab    10/31/2012 - 10/31/2012  Chemotherapy  Cetuximab    11/14/2012 - 12/12/2012  Chemotherapy  5FU/Leucovorin + Avastin    07/02/2013  Imaging  CT CAP and head- progression of disease with brain met. See brain met oncology history for details.    07/03/2013 -  Chemotherapy  FOLFIRI + Avastin  ALLERGIES:  is allergic to erbitux.  MEDICATIONS: has a current medication list which includes the following prescription(s): albuterol, amlodipine, aspirin, aspirin-salicylamide-caffeine, dexamethasone, diphenoxylate-atropine, lidocaine, lisinopril, loperamide, lorazepam, metoprolol, morphine, multivitamin, oxycodone, pantoprazole, potassium  chloride sa, prochlorperazine, and triamterene-hydrochlorothiazide, and the following Facility-Administered Medications: heparin lock flush, octreotide, potassium chloride, and sodium chloride.  SURGICAL HISTORY:  Past Surgical History  Procedure Laterality Date  . Irrigation and debridement sebaceous cyst    . Laparoscopic lysis intestinal adhesions  2009    With incidental appendectomy  . Coronary artery bypass graft  2011    2 vessel  . Abdominoperineal proctocolectomy  2008  . Portacath placement  2011  . Colostomy      FAMILY HISTORY: family history includes Cancer in his maternal grandmother and mother.  SOCIAL HISTORY:  reports that he quit smoking about 3 years ago. He has never used smokeless tobacco. He reports that he does not drink alcohol or use illicit drugs.  REVIEW OF SYSTEMS:  Other than that discussed above is noncontributory.  PHYSICAL EXAMINATION: ECOG PERFORMANCE STATUS: 3 - Symptomatic, >50% confined to bed  Blood pressure 112/78, pulse 105, temperature 97 F (36.1 C), resp. rate 20, weight 169 lb 12.8 oz (77.021 kg).  GENERAL:alert, no distress and comfortable SKIN: skin color, texture, turgor are normal, no rashes or significant lesions EYES: PERLA; Conjunctiva are pink and non-injected, sclera clear SINUSES: No redness or tenderness over maxillary or ethmoid sinuses OROPHARYNX: Mucositis noted along the gumline. No evidence of plaques.Marland Kitchen NECK: supple, thyroid normal size, non-tender, without nodularity. No masses CHEST: Increased AP diameter with no breast masses. LifePort in place. LYMPH:  no palpable lymphadenopathy in the cervical, axillary or inguinal LUNGS: clear to auscultation and percussion with normal breathing effort HEART: regular rate & rhythm and no murmurs. ABDOMEN:abdomen soft, non-tender and normal bowel sounds. No pedal splenomegaly, ascites, or CVA tenderness. MUSCULOSKELETAL:no cyanosis of digits and no clubbing. Range of motion normal.   NEURO: alert & oriented x 3 with fluent speech, no focal motor/sensory deficits   LABORATORY DATA: Infusion on 07/17/2013  Component Date Value Ref Range Status  . WBC 07/17/2013 5.1  4.0 - 10.5 K/uL Final  . RBC 07/17/2013 4.38  4.22 - 5.81 MIL/uL Final  . Hemoglobin 07/17/2013 13.2  13.0 - 17.0 g/dL Final  . HCT 07/17/2013 38.0* 39.0 - 52.0 % Final  . MCV 07/17/2013 86.8  78.0 - 100.0 fL Final  . MCH 07/17/2013 30.1  26.0 - 34.0 pg Final  . MCHC 07/17/2013 34.7  30.0 - 36.0 g/dL Final  . RDW 07/17/2013 14.0  11.5 - 15.5 % Final  . Platelets 07/17/2013 280  150 - 400 K/uL Final  . Neutrophils Relative % 07/17/2013 62  43 - 77 % Final  . Neutro Abs 07/17/2013 3.2  1.7 - 7.7 K/uL Final  . Lymphocytes Relative 07/17/2013 20  12 - 46 % Final  . Lymphs Abs 07/17/2013 1.0  0.7 - 4.0 K/uL Final  . Monocytes Relative 07/17/2013 15* 3 - 12 % Final  . Monocytes Absolute 07/17/2013 0.8  0.1 - 1.0 K/uL Final  . Eosinophils Relative 07/17/2013 2  0 - 5 % Final  . Eosinophils Absolute 07/17/2013 0.1  0.0 - 0.7 K/uL Final  . Basophils Relative 07/17/2013 1  0 - 1 % Final  . Basophils Absolute 07/17/2013 0.1  0.0 - 0.1 K/uL Final  . Sodium 07/17/2013 129* 137 - 147 mEq/L Final  . Potassium 07/17/2013 3.0* 3.7 - 5.3 mEq/L Final  .  Chloride 07/17/2013 87* 96 - 112 mEq/L Final  . CO2 07/17/2013 26  19 - 32 mEq/L Final  . Glucose, Bld 07/17/2013 118* 70 - 99 mg/dL Final  . BUN 07/17/2013 11  6 - 23 mg/dL Final  . Creatinine, Ser 07/17/2013 1.22  0.50 - 1.35 mg/dL Final  . Calcium 07/17/2013 8.9  8.4 - 10.5 mg/dL Final  . Total Protein 07/17/2013 6.5  6.0 - 8.3 g/dL Final  . Albumin 07/17/2013 2.9* 3.5 - 5.2 g/dL Final  . AST 07/17/2013 13  0 - 37 U/L Final  . ALT 07/17/2013 10  0 - 53 U/L Final  . Alkaline Phosphatase 07/17/2013 95  39 - 117 U/L Final  . Total Bilirubin 07/17/2013 0.4  0.3 - 1.2 mg/dL Final  . GFR calc non Af Amer 07/17/2013 65* >90 mL/min Final  . GFR calc Af Amer 07/17/2013  75* >90 mL/min Final   Comment: (NOTE)                          The eGFR has been calculated using the CKD EPI equation.                          This calculation has not been validated in all clinical situations.                          eGFR's persistently <90 mL/min signify possible Chronic Kidney                          Disease.  Marland Kitchen Specific Gravity, Urine 07/17/2013 1.020  1.005 - 1.030 Final  . pH 07/17/2013 6.0  5.0 - 8.0 Final  . Glucose, UA 07/17/2013 NEGATIVE  NEGATIVE mg/dL Final  . Hgb urine dipstick 07/17/2013 NEGATIVE  NEGATIVE Final  . Bilirubin Urine 07/17/2013 NEGATIVE  NEGATIVE Final  . Ketones, ur 07/17/2013 NEGATIVE  NEGATIVE mg/dL Final  . Protein, ur 07/17/2013 30* NEGATIVE mg/dL Final  . Urobilinogen, UA 07/17/2013 0.2  0.0 - 1.0 mg/dL Final  . Nitrite 07/17/2013 NEGATIVE  NEGATIVE Final  . Leukocytes, UA 07/17/2013 NEGATIVE  NEGATIVE Final  Infusion on 07/03/2013  Component Date Value Ref Range Status  . WBC 07/03/2013 8.9  4.0 - 10.5 K/uL Final  . RBC 07/03/2013 3.92* 4.22 - 5.81 MIL/uL Final  . Hemoglobin 07/03/2013 11.7* 13.0 - 17.0 g/dL Final  . HCT 07/03/2013 35.1* 39.0 - 52.0 % Final  . MCV 07/03/2013 89.5  78.0 - 100.0 fL Final  . MCH 07/03/2013 29.8  26.0 - 34.0 pg Final  . MCHC 07/03/2013 33.3  30.0 - 36.0 g/dL Final  . RDW 07/03/2013 14.1  11.5 - 15.5 % Final  . Platelets 07/03/2013 462* 150 - 400 K/uL Final  . Neutrophils Relative % 07/03/2013 86* 43 - 77 % Final  . Neutro Abs 07/03/2013 7.6  1.7 - 7.7 K/uL Final  . Lymphocytes Relative 07/03/2013 8* 12 - 46 % Final  . Lymphs Abs 07/03/2013 0.7  0.7 - 4.0 K/uL Final  . Monocytes Relative 07/03/2013 6  3 - 12 % Final  . Monocytes Absolute 07/03/2013 0.5  0.1 - 1.0 K/uL Final  . Eosinophils Relative 07/03/2013 0  0 - 5 % Final  . Eosinophils Absolute 07/03/2013 0.0  0.0 - 0.7 K/uL Final  . Basophils Relative 07/03/2013 0  0 - 1 %  Final  . Basophils Absolute 07/03/2013 0.0  0.0 - 0.1 K/uL Final  .  Sodium 07/03/2013 138  137 - 147 mEq/L Final  . Potassium 07/03/2013 4.0  3.7 - 5.3 mEq/L Final  . Chloride 07/03/2013 97  96 - 112 mEq/L Final  . CO2 07/03/2013 26  19 - 32 mEq/L Final  . Glucose, Bld 07/03/2013 185* 70 - 99 mg/dL Final  . BUN 36/85/9923 11  6 - 23 mg/dL Final  . Creatinine, Ser 07/03/2013 1.18  0.50 - 1.35 mg/dL Final  . Calcium 41/44/3601 9.3  8.4 - 10.5 mg/dL Final  . Total Protein 07/03/2013 6.9  6.0 - 8.3 g/dL Final  . Albumin 65/80/0634 2.9* 3.5 - 5.2 g/dL Final  . AST 94/94/4739 14  0 - 37 U/L Final  . ALT 07/03/2013 9  0 - 53 U/L Final  . Alkaline Phosphatase 07/03/2013 96  39 - 117 U/L Final  . Total Bilirubin 07/03/2013 0.2* 0.3 - 1.2 mg/dL Final  . GFR calc non Af Amer 07/03/2013 67* >90 mL/min Final  . GFR calc Af Amer 07/03/2013 78* >90 mL/min Final   Comment: (NOTE)                          The eGFR has been calculated using the CKD EPI equation.                          This calculation has not been validated in all clinical situations.                          eGFR's persistently <90 mL/min signify possible Chronic Kidney                          Disease.  . CEA 07/03/2013 39.5* 0.0 - 5.0 ng/mL Final   Performed at [facility]  . Specific Gravity, Urine 07/03/2013 1.025  1.005 - 1.030 Final  . pH 07/03/2013 5.5  5.0 - 8.0 Final  . Glucose, UA 07/03/2013 NEGATIVE  NEGATIVE mg/dL Final  . Hgb urine dipstick 07/03/2013 NEGATIVE  NEGATIVE Final  . Bilirubin Urine 07/03/2013 NEGATIVE  NEGATIVE Final  . Ketones, ur 07/03/2013 NEGATIVE  NEGATIVE mg/dL Final  . Protein, ur 58/44/1712 30* NEGATIVE mg/dL Final  . Urobilinogen, UA 07/03/2013 0.2  0.0 - 1.0 mg/dL Final  . Nitrite 78/71/8367 NEGATIVE  NEGATIVE Final  . Leukocytes, UA 07/03/2013 NEGATIVE  NEGATIVE Final  Office Visit on 06/19/2013  Component Date Value Ref Range Status  . WBC 06/19/2013 9.9  4.0 - 10.5 K/uL Final  . RBC 06/19/2013 4.21* 4.22 - 5.81 MIL/uL Final  . Hemoglobin  06/19/2013 12.5* 13.0 - 17.0 g/dL Final  . HCT 25/50/0164 37.7* 39.0 - 52.0 % Final  . MCV 06/19/2013 89.5  78.0 - 100.0 fL Final  . MCH 06/19/2013 29.7  26.0 - 34.0 pg Final  . MCHC 06/19/2013 33.2  30.0 - 36.0 g/dL Final  . RDW 29/05/7953 15.4  11.5 - 15.5 % Final  . Platelets 06/19/2013 307  150 - 400 K/uL Final  . Neutrophils Relative % 06/19/2013 79* 43 - 77 % Final  . Neutro Abs 06/19/2013 7.9* 1.7 - 7.7 K/uL Final  . Lymphocytes Relative 06/19/2013 10* 12 - 46 % Final  . Lymphs Abs 06/19/2013 1.0  0.7 - 4.0 K/uL Final  . Monocytes  Relative 06/19/2013 6  3 - 12 % Final  . Monocytes Absolute 06/19/2013 0.6  0.1 - 1.0 K/uL Final  . Eosinophils Relative 06/19/2013 4  0 - 5 % Final  . Eosinophils Absolute 06/19/2013 0.4  0.0 - 0.7 K/uL Final  . Basophils Relative 06/19/2013 1  0 - 1 % Final  . Basophils Absolute 06/19/2013 0.1  0.0 - 0.1 K/uL Final  . Sodium 06/19/2013 139  137 - 147 mEq/L Final  . Potassium 06/19/2013 3.7  3.7 - 5.3 mEq/L Final  . Chloride 06/19/2013 98  96 - 112 mEq/L Final  . CO2 06/19/2013 20  19 - 32 mEq/L Final  . Glucose, Bld 06/19/2013 93  70 - 99 mg/dL Final  . BUN 06/19/2013 20  6 - 23 mg/dL Final  . Creatinine, Ser 06/19/2013 1.64* 0.50 - 1.35 mg/dL Final  . Calcium 06/19/2013 9.8  8.4 - 10.5 mg/dL Final  . Total Protein 06/19/2013 7.6  6.0 - 8.3 g/dL Final  . Albumin 06/19/2013 3.6  3.5 - 5.2 g/dL Final  . AST 06/19/2013 31  0 - 37 U/L Final  . ALT 06/19/2013 18  0 - 53 U/L Final  . Alkaline Phosphatase 06/19/2013 97  39 - 117 U/L Final  . Total Bilirubin 06/19/2013 0.2* 0.3 - 1.2 mg/dL Final  . GFR calc non Af Amer 06/19/2013 45* >90 mL/min Final  . GFR calc Af Amer 06/19/2013 52* >90 mL/min Final   Comment: (NOTE)                          The eGFR has been calculated using the CKD EPI equation.                          This calculation has not been validated in all clinical situations.                          eGFR's persistently <90 mL/min signify  possible Chronic Kidney                          Disease.  . CEA 06/19/2013 63.1* 0.0 - 5.0 ng/mL Final   Performed at [place]: No new pathology. K-ras this wild-type the patient experienced a severe reaction to cetuximab. 2  Urinalysis    Component Value Date/Time   COLORURINE YELLOW 03/02/2013 1400   APPEARANCEUR CLEAR 03/02/2013 1400   LABSPEC 1.020 07/17/2013 0833   PHURINE 6.0 07/17/2013 [place] 07/17/2013 0833   HGBUR NEGATIVE 07/17/2013 0833   BILIRUBINUR NEGATIVE 07/17/2013 0833   KETONESUR NEGATIVE 07/17/2013 0833   PROTEINUR 30* 07/17/2013 0833   UROBILINOGEN 0.2 07/17/2013 0833   NITRITE NEGATIVE 07/17/2013 0833   LEUKOCYTESUR NEGATIVE 07/17/2013 0833    RADIOGRAPHIC STUDIES: Dg Chest 2 View  06/19/2013   CLINICAL DATA:  Cough and congestion and hoarseness and shortness of breath for 3 weeks; known metastatic rectal malignancy.  EXAM: CHEST  2 VIEW  COMPARISON:  CT ANGIO CHEST AORTA W/CM & WO/CM dated 03/02/2013  FINDINGS: Since the previous study there has been interval increase in the size of the multiple bilateral pulmonary parenchymal masses. The lungs are well-expanded. There is no pleural effusion or pneumothorax. The cardiac silhouette is normal in size. The pulmonary vascularity is not engorged. The patient has undergone previous CABG.  Hilar and mediastinal lymphadenopathy is present. The observed portions of the bony thorax exhibit no acute abnormalities.  IMPRESSION: There has been interval growth in the size of the bilateral metastatic pulmonary parenchymal masses. There is persistent hilar and mediastinal lymphadenopathy. There is no evidence of pneumonia nor CHF.   Electronically Signed   By: David  Martinique   On: 06/19/2013 14:36   Ct Head W Wo Contrast  07/02/2013   CLINICAL DATA:  Rectal carcinoma with dizziness and blurred vision  EXAM: CT HEAD WITHOUT AND WITH CONTRAST  TECHNIQUE: Contiguous axial images were obtained from the  base of the skull through the vertex without and with intravenous contrast  CONTRAST:  80 mL Omnipaque 300 nonionic  COMPARISON:  Noncontrast study March 02, 2013  FINDINGS: The ventricles are normal in size and configuration. There is now vasogenic edema in the lateral right cerebellum. Within this area of vasogenic edema, there is a mass with irregular rim enhancement measuring 1.5 x 1.5 cm. No other mass is appreciable on this study. There is no hemorrhage, extra-axial fluid, or midline shift. Elsewhere, gray-white compartments appear normal. No acute infarct is apparent. Mass  IMPRESSION: Mass with surrounding vasogenic edema measuring 1.5 x 1.5 cm in the lateral right cerebellum. This mass shows irregular rim enhancement. This finding is consistent with neoplasia, and in the setting of known rectal carcinoma most likely represents a metastatic focus. No other lesion identified.   Electronically Signed   By: Lowella Grip M.D.   On: 07/02/2013 10:02   Ct Chest W Contrast  07/02/2013   CLINICAL DATA:  Restaging metastatic rectal carcinoma, history of hypertension, hyperlipidemia, former smoker, alcohol abuse  EXAM: CT CHEST, ABDOMEN, AND PELVIS WITH CONTRAST  TECHNIQUE: Multidetector CT imaging of the chest, abdomen and pelvis was performed following the standard protocol during bolus administration of intravenous contrast. Sagittal and coronal MPR images reconstructed from axial data set.  CONTRAST:  19mL OMNIPAQUE IOHEXOL 300 MG/ML SOLN IV. Dilute oral contrast.  COMPARISON:  CT chest 03/02/2013, CT chest abdomen and pelvis 10/17/2012  FINDINGS: CT CHEST FINDINGS  Vascular structures grossly patent on nondedicated exam.  Mediastinal and bilateral hilar adenopathy increased since previous study.  AP window lymph node 17 mm short axis image 25 unchanged.  Precarinal lymph node 20 mm short axis image 24, previously 7 mm.  Prevascular lymph node 16 mm short axis image 23, previously 6 mm.  Right hilar  node at 24 mm image 29, previously 12 mm.  Postsurgical atherosclerotic disease with postsurgical changes of CABG.  Pulmonary metastases increased in sizes and number since previous study.  Index lesion superior segment right lower lobe 5.3 x 4.0 cm image 35 previously 4.2 x 3.4 cm.  Index lesion left lower lobe 5.2 x 3.9 cm image 41, previously 4.5 x 3.4 cm.  Left pleural metastases with moderate-sized left pleural effusion increased since previous exam.  Minimal pericardial fluid and nodularity question metastatic disease.  No osseous metastases.  CT ABDOMEN AND PELVIS FINDINGS  9 mm nodule right lobe liver image 66 unchanged.  Tiny calcified granuloma within liver.  Tiny calcified gallstone.  Liver, spleen, pancreas, kidneys, and adrenal glands normal.  Lax anterior abdominal wall fascia with small supraumbilical hernia containing fat  Left lower quadrant descending colostomy post APR.  Scattered atherosclerotic calcifications.  Unremarkable bladder and prostate.  Stomach decompressed, unable to assess wall thickness.  Large and small bowel loops otherwise unremarkable.  Minimally enlarged left para-aortic lymph node 11 mm short axis  image 75, new.  No mass, additional adenopathy, free fluid, free air, or acute osseous findings.  IMPRESSION: Progression of pulmonary metastatic disease and thoracic adenopathy since previous study.  New minimally enlarged left para-aortic lymph node.  Cholelithiasis.  Stable low-attenuation right lobe liver lesion.   Electronically Signed   By: Lavonia Dana M.D.   On: 07/02/2013 09:59   Mr Jeri Cos XA Contrast  07/16/2013   CLINICAL DATA:  Colon and renal cancer with metastatic disease to lung and brain lateral right cerebellar. Stereotactic radiation surgery planning exam.  EXAM: MRI HEAD WITHOUT AND WITH CONTRAST  TECHNIQUE: Multiplanar, multiecho pulse sequences of the brain and surrounding structures were obtained without and with intravenous contrast.  CONTRAST:  25mL  MULTIHANCE GADOBENATE DIMEGLUMINE 529 MG/ML IV SOLN  COMPARISON:  07/02/2013 head CT.  No comparison brain MR.  FINDINGS: 1.2 x 1 x 0.9 cm enhancing lesion lateral aspect of the right cerebellum may contain blood breakdown products and represent hemorrhagic metastatic lesion from renal cell cancer. Minimal surrounding vasogenic edema.  No other parenchymal enhancing lesions. There are areas of artifact (most notable left internal auditory canal region and frontal lobe) but without other intracranial mass identified.  No acute infarct.  No intracranial hemorrhage separate from the right cerebellar lesion.  Mild small vessel disease type changes.  Atherosclerotic type changes right vertebral artery with narrowing. Remainder of major intracranial vascular structures are patent.  IMPRESSION: 1.2 x 1 x 0.9 cm hemorrhagic enhancing lesion lateral aspect of the right cerebellum may represent metastatic lesion from renal cell cancer. Minimal surrounding vasogenic edema.  No other parenchymal enhancing lesions.  No acute infarct.  Mild small vessel disease type changes.  Atherosclerotic type changes right vertebral artery with narrowing.   Electronically Signed   By: Chauncey Cruel M.D.   On: 07/16/2013 12:18   Ct Abdomen Pelvis W Contrast  07/02/2013   CLINICAL DATA:  Restaging metastatic rectal carcinoma, history of hypertension, hyperlipidemia, former smoker, alcohol abuse  EXAM: CT CHEST, ABDOMEN, AND PELVIS WITH CONTRAST  TECHNIQUE: Multidetector CT imaging of the chest, abdomen and pelvis was performed following the standard protocol during bolus administration of intravenous contrast. Sagittal and coronal MPR images reconstructed from axial data set.  CONTRAST:  85mL OMNIPAQUE IOHEXOL 300 MG/ML SOLN IV. Dilute oral contrast.  COMPARISON:  CT chest 03/02/2013, CT chest abdomen and pelvis 10/17/2012  FINDINGS: CT CHEST FINDINGS  Vascular structures grossly patent on nondedicated exam.  Mediastinal and bilateral hilar  adenopathy increased since previous study.  AP window lymph node 17 mm short axis image 25 unchanged.  Precarinal lymph node 20 mm short axis image 24, previously 7 mm.  Prevascular lymph node 16 mm short axis image 23, previously 6 mm.  Right hilar node at 24 mm image 29, previously 12 mm.  Postsurgical atherosclerotic disease with postsurgical changes of CABG.  Pulmonary metastases increased in sizes and number since previous study.  Index lesion superior segment right lower lobe 5.3 x 4.0 cm image 35 previously 4.2 x 3.4 cm.  Index lesion left lower lobe 5.2 x 3.9 cm image 41, previously 4.5 x 3.4 cm.  Left pleural metastases with moderate-sized left pleural effusion increased since previous exam.  Minimal pericardial fluid and nodularity question metastatic disease.  No osseous metastases.  CT ABDOMEN AND PELVIS FINDINGS  9 mm nodule right lobe liver image 66 unchanged.  Tiny calcified granuloma within liver.  Tiny calcified gallstone.  Liver, spleen, pancreas, kidneys, and adrenal glands normal.  Lax anterior abdominal wall fascia with small supraumbilical hernia containing fat  Left lower quadrant descending colostomy post APR.  Scattered atherosclerotic calcifications.  Unremarkable bladder and prostate.  Stomach decompressed, unable to assess wall thickness.  Large and small bowel loops otherwise unremarkable.  Minimally enlarged left para-aortic lymph node 11 mm short axis image 75, new.  No mass, additional adenopathy, free fluid, free air, or acute osseous findings.  IMPRESSION: Progression of pulmonary metastatic disease and thoracic adenopathy since previous study.  New minimally enlarged left para-aortic lymph node.  Cholelithiasis.  Stable low-attenuation right lobe liver lesion.   Electronically Signed   By: Lavonia Dana M.D.   On: 07/02/2013 09:59    ASSESSMENT:  #1. 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 #2. 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 #3. Dehydration. #4. Oral mucositis with hypersecretion.   PLAN:  #1. IV saline plus potassium. #2. Somatostatin 200 mcg subcutaneously. #3. Dexamethasone 10 mg intravenously. #4. Prescription for Lomotil to take 2 every 2-4 hours to control diarrhea. #5. Mucinex twice a day. #6. Dexamethasone 8 mg twice a day. #7. Xylocaine Viscous 2 teaspoonfuls  mixed with equal volume of water and gargle and spit for mouth discomfort. May rub on the gums full-strength #8. Proceed with gamma knife treatment of single brain lesion on 07/23/2013 with return to this office on 07/24/2013 with CBC and chem profile.  #9. Will hold off on systemic chemotherapy until UGT1 status is determined.   All questions were answered. The patient knows to call the clinic with any problems, questions or concerns. We can certainly see the patient much sooner if necessary.   I spent 40 minutes counseling the patient face to face. The total time spent in the appointment was 55 minutes.    Farrel Gobble, MD 07/17/2013 10:56 AM  DISCLAIMER:  This note was dictated with voice recognition software.  Similar sounding words can inadvertently be transcribed inaccurately and may not be corrected upon review.

## 2013-07-19 ENCOUNTER — Encounter (HOSPITAL_COMMUNITY): Payer: Medicare Other

## 2013-07-19 ENCOUNTER — Ambulatory Visit (HOSPITAL_COMMUNITY): Payer: Medicare Other | Admitting: Oncology

## 2013-07-23 ENCOUNTER — Encounter: Payer: Self-pay | Admitting: Radiation Oncology

## 2013-07-23 ENCOUNTER — Encounter: Payer: Self-pay | Admitting: *Deleted

## 2013-07-23 ENCOUNTER — Ambulatory Visit
Admission: RE | Admit: 2013-07-23 | Discharge: 2013-07-23 | Disposition: A | Discharge status: Home / Self Care | Payer: Medicare Other | Source: Ambulatory Visit | Attending: Radiation Oncology | Admitting: Radiation Oncology

## 2013-07-23 VITALS — BP 133/86 | HR 81 | Temp 97.5°F | Resp 20

## 2013-07-23 DIAGNOSIS — Z923 Personal history of irradiation: Secondary | ICD-10-CM

## 2013-07-23 DIAGNOSIS — C7931 Secondary malignant neoplasm of brain: Secondary | ICD-10-CM

## 2013-07-23 HISTORY — DX: Personal history of irradiation: Z92.3

## 2013-07-23 NOTE — Op Note (Signed)
  Name: Martin Maldonado  MRN: [id number]  Date: 07/23/2013   DOB: [birth year]  Stereotactic Radiosurgery Operative Note  PRE-OPERATIVE DIAGNOSIS:  Solitary Brain Metastasis  POST-OPERATIVE DIAGNOSIS:  Solitary Brain Metastasis  PROCEDURE:  Stereotactic Radiosurgery  SURGEON:  Charlie Pitter, MD  NARRATIVE: The patient underwent a radiation treatment planning session in the radiation oncology simulation suite under the care of the radiation oncology physician and physicist.  I participated closely in the radiation treatment planning afterwards. The patient underwent planning CT which was fused to 3T high resolution MRI with 1 mm axial slices.  These images were fused on the planning system.  We contoured the gross target volumes and subsequently expanded this to yield the Planning Target Volume. I actively participated in the planning process.  I helped to define and review the target contours and also the contours of the optic pathway, eyes, brainstem and selected nearby organs at risk.  All the dose constraints for critical structures were reviewed and compared to AAPM Task Group 101.  The prescription dose conformity was reviewed.  I approved the plan electronically.    Accordingly, [name] was brought to the TrueBeam stereotactic radiation treatment linac and placed in the custom immobilization mask.  The patient was aligned according to the IR fiducial markers with BrainLab Exactrac, then orthogonal x-rays were used in ExacTrac with the 6DOF robotic table and the shifts were made to align the patient  [name] received stereotactic radiosurgery uneventfully.    The detailed description of the procedure is recorded in the radiation oncology procedure note.  I was present for the duration of the procedure.  DISPOSITION:  Following delivery, the patient was transported to nursing in stable condition and monitored for possible acute effects to be discharged to home in stable condition with  follow-up in one month.  Charlie Pitter, MD 07/23/2013 12:44 PM

## 2013-07-23 NOTE — Progress Notes (Signed)
  Radiation Oncology         [phone] ________________________________  Stereotactic Treatment Procedure Note  Name: AVIGDOR DOLLAR MRN: [id number]  Date: 07/23/2013  DOB: Jul 07, 1955  SPECIAL TREATMENT PROCEDURE OUTPATIENT  3D TREATMENT PLANNING AND DOSIMETRY:  The patient's radiation plan was reviewed and approved by neurosurgery and radiation oncology prior to treatment.  It showed 3-dimensional radiation distributions overlaid onto the planning CT/MRI image set.  The [facility] for the target structures as well as the organs at risk were reviewed. The documentation of the 3D plan and dosimetry are filed in the radiation oncology EMR.  NARRATIVE:  Martin Maldonado was brought to the TrueBeam stereotactic radiation treatment machine and placed supine on the CT couch. The head frame was applied, and the patient was set up for stereotactic radiosurgery.  Neurosurgery was present for the set-up and delivery  SIMULATION VERIFICATION:  In the couch zero-angle position, the patient underwent Exactrac imaging using the Brainlab system with orthogonal KV images.  These were carefully aligned and repeated to confirm treatment position for each of the isocenters.  The Exactrac snap film verification was repeated at each couch angle.  SPECIAL TREATMENT PROCEDURE: [name] received stereotactic radiosurgery to the following targets: Right cerebellar target was treated using 4 Dynamic Conformal Arcs to a prescription dose of 20 Gy.  ExacTrac Snap verification was performed for each couch angle.This constitutes a special treatment procedure due to the ablative dose delivered and the technical nature of treatment.  This highly technical modality of treatment ensures that the ablative dose is centered on the patient's tumor while sparing normal tissues from excessive dose and risk of detrimental effects.  STEREOTACTIC TREATMENT MANAGEMENT:  Following delivery, the patient was transported to nursing in stable  condition and monitored for possible acute effects.  Vital signs were recorded BP 133/86  Pulse 81  Temp(Src) 97.5 F (36.4 C) (Oral)  Resp 20. The patient tolerated treatment without significant acute effects, and was discharged to home in stable condition.    PLAN: Follow-up in one month.  ________________________________   Eppie Gibson, MD

## 2013-07-23 NOTE — Progress Notes (Signed)
[facility] Psychosocial Distress Screening Clinical Social Work  Clinical Social Work was referred by distress screening protocol.  The patient scored a 5 on the Psychosocial Distress Thermometer which indicates moderate distress. Clinical Social Worker met with pt and family prior to appointment to assess for distress and other psychosocial needs. CSW introduced self and reviewed [place] resources, groups and supports to assist. Wife stated they "had been on this road for several years". Pt gets most treatment at [facility], so CSW discussed additional supports here to assist. Pt reports adequate coping currently. CSW provided pt and family with CSW contact info and support team info. They agree to follow up as needed.   ONCBCN DISTRESS SCREENING 07/04/2013  Screening Type Initial Screening  Elta Gu[place] the number that describes how much distress you have been experiencing in the past week 5  Physical Problem type Pain;Nausea/vomiting;Loss of appetitie;Talking  Physician notified of physical symptoms Yes  Referral to clinical psychology   Referral to clinical social work Yes  Referral to dietition   Referral to financial advocate   Referral to support programs   Referral to palliative care   Other    Clinical Social Worker follow up needed: no  Loren Racer, [place]. [place] for [place] Wednesday, Thursday and Friday Phone: [phone] Fax: [phone]

## 2013-07-23 NOTE — Progress Notes (Signed)
Pt resting quietly in recliner, room 1. Two family members at his side. Pt denies HA, pain, nausea, dizziness, blurred vision. VS stable. Pt drinking water. Pt understands he is to stay x 1 hour, see Dr Isidore Moos and receive FU card before he leaves today.  2:10 pm Pt denies HA, pain, nausea, dizziness, blurred vision. VS stable. He has FU card. Dr Isidore Moos in to see pt and d/c home, ambulatory w/family members.

## 2013-07-24 ENCOUNTER — Encounter (HOSPITAL_COMMUNITY): Payer: Medicare Other | Attending: Oncology

## 2013-07-24 ENCOUNTER — Encounter (HOSPITAL_BASED_OUTPATIENT_CLINIC_OR_DEPARTMENT_OTHER): Payer: Medicare Other

## 2013-07-24 ENCOUNTER — Encounter (HOSPITAL_COMMUNITY): Payer: Self-pay

## 2013-07-24 ENCOUNTER — Other Ambulatory Visit (HOSPITAL_COMMUNITY): Payer: Self-pay | Admitting: Hematology and Oncology

## 2013-07-24 VITALS — BP 136/78 | HR 84 | Temp 98.1°F | Resp 18 | Wt 165.4 lb

## 2013-07-24 DIAGNOSIS — R490 Dysphonia: Secondary | ICD-10-CM

## 2013-07-24 DIAGNOSIS — C7931 Secondary malignant neoplasm of brain: Secondary | ICD-10-CM | POA: Insufficient documentation

## 2013-07-24 DIAGNOSIS — C20 Malignant neoplasm of rectum: Secondary | ICD-10-CM

## 2013-07-24 DIAGNOSIS — C7949 Secondary malignant neoplasm of other parts of nervous system: Secondary | ICD-10-CM

## 2013-07-24 DIAGNOSIS — C7800 Secondary malignant neoplasm of unspecified lung: Secondary | ICD-10-CM

## 2013-07-24 DIAGNOSIS — J3800 Paralysis of vocal cords and larynx, unspecified: Secondary | ICD-10-CM

## 2013-07-24 DIAGNOSIS — J449 Chronic obstructive pulmonary disease, unspecified: Secondary | ICD-10-CM

## 2013-07-24 DIAGNOSIS — C189 Malignant neoplasm of colon, unspecified: Secondary | ICD-10-CM | POA: Insufficient documentation

## 2013-07-24 DIAGNOSIS — Z9889 Other specified postprocedural states: Secondary | ICD-10-CM | POA: Insufficient documentation

## 2013-07-24 DIAGNOSIS — C787 Secondary malignant neoplasm of liver and intrahepatic bile duct: Secondary | ICD-10-CM

## 2013-07-24 DIAGNOSIS — Z5111 Encounter for antineoplastic chemotherapy: Secondary | ICD-10-CM

## 2013-07-24 DIAGNOSIS — Z5112 Encounter for antineoplastic immunotherapy: Secondary | ICD-10-CM

## 2013-07-24 LAB — COMPREHENSIVE METABOLIC PANEL
ALBUMIN: 3.1 g/dL — AB (ref 3.5–5.2)
ALT: 15 U/L (ref 0–53)
AST: 15 U/L (ref 0–37)
Alkaline Phosphatase: 92 U/L (ref 39–117)
BUN: 30 mg/dL — ABNORMAL HIGH (ref 6–23)
CALCIUM: 8.8 mg/dL (ref 8.4–10.5)
CO2: 24 mEq/L (ref 19–32)
Chloride: 96 mEq/L (ref 96–112)
Creatinine, Ser: 1.34 mg/dL (ref 0.50–1.35)
GFR calc non Af Amer: 58 mL/min — ABNORMAL LOW (ref >90)
GFR, EST AFRICAN AMERICAN: 67 mL/min — AB (ref >90)
GLUCOSE: 100 mg/dL — AB (ref 70–99)
Potassium: 4.5 mEq/L (ref 3.7–5.3)
SODIUM: 132 meq/L — AB (ref 137–147)
Total Bilirubin: 0.2 mg/dL — ABNORMAL LOW (ref 0.3–1.2)
Total Protein: 6.3 g/dL (ref 6.0–8.3)

## 2013-07-24 LAB — CBC WITH DIFFERENTIAL/PLATELET
Basophils Absolute: 0 10*3/uL (ref 0.0–0.1)
Basophils Relative: 0 % (ref 0–1)
EOS ABS: 0 10*3/uL (ref 0.0–0.7)
Eosinophils Relative: 0 % (ref 0–5)
HCT: 36.5 % — ABNORMAL LOW (ref 39.0–52.0)
HEMOGLOBIN: 12.6 g/dL — AB (ref 13.0–17.0)
Lymphocytes Relative: 8 % — ABNORMAL LOW (ref 12–46)
Lymphs Abs: 1 10*3/uL (ref 0.7–4.0)
MCH: 30.5 pg (ref 26.0–34.0)
MCHC: 34.5 g/dL (ref 30.0–36.0)
MCV: 88.4 fL (ref 78.0–100.0)
Monocytes Absolute: 0.7 10*3/uL (ref 0.1–1.0)
Monocytes Relative: 6 % (ref 3–12)
NEUTROS PCT: 86 % — AB (ref 43–77)
Neutro Abs: 9.9 10*3/uL — ABNORMAL HIGH (ref 1.7–7.7)
Platelets: 252 10*3/uL (ref 150–400)
RBC: 4.13 MIL/uL — ABNORMAL LOW (ref 4.22–5.81)
RDW: 15.3 % (ref 11.5–15.5)
WBC: 11.6 10*3/uL — ABNORMAL HIGH (ref 4.0–10.5)

## 2013-07-24 LAB — URINALYSIS, DIPSTICK ONLY
Bilirubin Urine: NEGATIVE
GLUCOSE, UA: NEGATIVE mg/dL
Hgb urine dipstick: NEGATIVE
Ketones, ur: NEGATIVE mg/dL
LEUKOCYTES UA: NEGATIVE
Nitrite: NEGATIVE
PH: 5.5 (ref 5.0–8.0)
Specific Gravity, Urine: >1.03 — ABNORMAL HIGH (ref 1.005–1.030)
Urobilinogen, UA: 0.2 mg/dL (ref 0.0–1.0)

## 2013-07-24 LAB — CEA: CEA: 41.7 ng/mL — ABNORMAL HIGH (ref 0.0–5.0)

## 2013-07-24 MED ORDER — LEUCOVORIN CALCIUM INJECTION 350 MG
400.0000 mg/m2 | Freq: Once | INTRAVENOUS | Status: AC
  Administered 2013-07-24: 836 mg via INTRAVENOUS
  Filled 2013-07-24: qty 41.8

## 2013-07-24 MED ORDER — DEXTROSE 5 % IV SOLN
20.0000 mg | Freq: Once | INTRAVENOUS | Status: AC
  Administered 2013-07-24: 20 mg via INTRAVENOUS
  Filled 2013-07-24: qty 4

## 2013-07-24 MED ORDER — SODIUM CHLORIDE 0.9 % IV SOLN: 5.0000 mg/kg | Freq: Once | INTRAVENOUS | Status: DC

## 2013-07-24 MED ORDER — FLUOROURACIL CHEMO INJECTION 2.5 GM/50ML
400.0000 mg/m2 | Freq: Once | INTRAVENOUS | Status: AC
  Administered 2013-07-24: 850 mg via INTRAVENOUS
  Filled 2013-07-24: qty 17

## 2013-07-24 MED ORDER — ONDANSETRON HCL 40 MG/20ML IJ SOLN
Freq: Once | INTRAMUSCULAR | Status: AC
  Administered 2013-07-24: 16 mg via INTRAVENOUS
  Filled 2013-07-24: qty 8

## 2013-07-24 MED ORDER — SODIUM CHLORIDE 0.9 % IV SOLN
Freq: Once | INTRAVENOUS | Status: AC
  Administered 2013-07-24: 10:00:00 via INTRAVENOUS

## 2013-07-24 MED ORDER — METOCLOPRAMIDE HCL 5 MG PO TABS: ORAL_TABLET | ORAL | Status: DC

## 2013-07-24 MED ORDER — SODIUM CHLORIDE 0.9 % IV SOLN: 16.0000 mg | Freq: Once | INTRAVENOUS | Status: DC

## 2013-07-24 MED ORDER — SODIUM CHLORIDE 0.9 % IV SOLN
2400.0000 mg/m2 | INTRAVENOUS | Status: DC
  Administered 2013-07-24: 5000 mg via INTRAVENOUS
  Filled 2013-07-24: qty 100

## 2013-07-24 MED ORDER — SODIUM CHLORIDE 0.9 % IV SOLN
5.0000 mg/kg | Freq: Once | INTRAVENOUS | Status: AC
  Administered 2013-07-24: 375 mg via INTRAVENOUS
  Filled 2013-07-24: qty 15

## 2013-07-24 MED ORDER — DEXAMETHASONE SODIUM PHOSPHATE 10 MG/ML IJ SOLN
20.0000 mg | Freq: Once | INTRAMUSCULAR | Status: DC
Start: 2013-07-24 — End: 2013-07-24

## 2013-07-24 MED ORDER — SODIUM CHLORIDE 0.9 % IJ SOLN
10.0000 mL | INTRAMUSCULAR | Status: DC | PRN
  Administered 2013-07-24: 10 mL

## 2013-07-24 NOTE — Patient Instructions (Addendum)
[facility] Discharge Instructions  RECOMMENDATIONS MADE BY THE CONSULTANT AND ANY TEST RESULTS WILL BE SENT TO YOUR REFERRING PHYSICIAN.  EXAM FINDINGS BY THE PHYSICIAN TODAY AND SIGNS OR SYMPTOMS TO REPORT TO CLINIC OR PRIMARY PHYSICIAN: Exam and findings as discussed by Dr. Barnet Glasgow.  Plans are to treat you today with Avastin, 5FU and Leucovorin.  Report uncontrolled nausea, vomiting, diarrhea, fevers, etc.  MEDICATIONS PRESCRIBED:  Decrease your decadron (dexamethasone) to 8 mg each morning Reglan 10 mg before meals and at bedtime Do not take any BC Powders. Stop the potassium  INSTRUCTIONS/FOLLOW-UP: Follow-up next week with blood work and office visit.  Thank you for choosing [facility] to provide your oncology and hematology care.  To afford each patient quality time with our providers, please arrive at least 15 minutes before your scheduled appointment time.  With your help, our goal is to use those 15 minutes to complete the necessary work-up to ensure our physicians have the information they need to help with your evaluation and healthcare recommendations.    Effective January 1st, 2014, we ask that you re-schedule your appointment with our physicians should you arrive 10 or more minutes late for your appointment.  We strive to give you quality time with our providers, and arriving late affects you and other patients whose appointments are after yours.    Again, thank you for choosing [facility].  Our hope is that these requests will decrease the amount of time that you wait before being seen by our physicians.       _____________________________________________________________  Should you have questions after your visit to [facility], please contact our office at [phone] between the hours of 8:30 a.m. and 5:00 p.m.  Voicemails left after 4:30 p.m. will not be returned until the following business day.  For  prescription refill requests, have your pharmacy contact our office with your prescription refill request.

## 2013-07-24 NOTE — Progress Notes (Signed)
[facility]  OFFICE PROGRESS NOTE  [name], MD [place] [id number]  DIAGNOSIS: No diagnosis found.  Chief Complaint  Patient presents with  . : Liver, lung, and brain metastases    CURRENT THERAPY: FOLFIRI plus Avastin cycle 1 on 07/05/2013 resulting in severe diarrhea.  INTERVAL HISTORY: Martin Maldonado 58 y.o. male returns for followup 2 weeks after initiation of chemotherapy with FOLFIRI plus Avastin on 07/05/2013 resulting in severe diarrhea. He also received stereotactic radiotherapy to a right cerebellar lesion receiving 20 gray on 07/23/2013. He received intravenous fluid resuscitation one week ago in addition to intravenous dexamethasone and octreotide 200 mcg. He was also placed back on dexamethasone 8 mg twice a day.  Diarrhea subsided almost the same date he was here in one week ago. He's had intermittent nausea with food appearing to watch in his stomach and not move. As a result he has not taken any calories orally. He's had no bowel movements since last week. He denies any headache. Peripheral paresthesias still exists with inability to feel the floor. He is able to manipulate with his fingers. He denies any fever, night sweats, and this has persistent hoarseness without soreness. Denies any dysuria, urinary hesitancy, with only mild headache but with difficult to be sleeping.  MEDICAL HISTORY: Past Medical History  Diagnosis Date  . Hypertension   . Rectal cancer 2008    Adenocarcinoma; AP resection in 2008; recurrence with lung metastases in 2011  . Arteriosclerotic cardiovascular disease (ASCVD) 2011    CABG surgery in 2011  . Hyperlipidemia   . 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 Splenomegaly 01/23/2009    Additional GI history includes small bowel obstruction, requiring laparotomy and lysis of adhesions in 2009, remote peptic ulcer disease and postoperative  perirectal infection  . ALCOHOL ABUSE, HX OF 01/23/2009  . Brain metastasis 07/02/2013    INTERIM HISTORY: has HYPERLIPIDEMIA; Tobacco abuse, in remission; HYPERTENSION; COPD; SPLENOMEGALY; SMALL BOWEL OBSTRUCTION, HX OF; Rectal cancer; CAD (coronary artery disease) of artery bypass graft; and Brain metastasis on his problem list.   Rectal cancer    05/09/2006  Surgery  Rectum, abdomino-perineal resection demonstrating invasive adenocarcinoma of rectum, moderately differentiated, with invasion into the perirectal adipose tissue with positive margin in the perirectal soft tissue. 1/12 lymph nodes positive for disease    06/14/2006 - 09/28/2006  Chemotherapy  Oxaliplatin, Xeloda, and Avastin x 6 cycles    10/12/2006 - 10/26/2006  Chemotherapy  Avastin    06/10/2009  Cancer Staging  Lung biopsy positive for adenocarcinoma of colorectal origin    06/30/2009 - 01/06/2010  Chemotherapy  FOLFOX + Avastin x 13 cycles    08/10/2010 - 12/09/2010  Chemotherapy  FOLFIRI + Avastin x 8 cycles    05/04/2011 - 10/12/2011  Chemotherapy  Cetuximab + 5FU/Leucovorin x 12 cycles    01/24/2012 - 07/25/2012  Chemotherapy  Cetuximab + 5FU/Leucovorin x 13 cycles    10/31/2012  Adverse Reaction  Anaphylaxis to Cetuximab    10/31/2012 - 10/31/2012  Chemotherapy  Cetuximab    11/14/2012 - 12/12/2012  Chemotherapy  5FU/Leucovorin + Avastin    07/02/2013  Imaging  CT CAP and head- progression of disease with brain met. See brain met oncology history for details.    07/03/2013 -  Chemotherapy  FOLFIRI + Avastin      ALLERGIES:  is allergic to erbitux.  MEDICATIONS:  has a current medication list which includes the following prescription(s): albuterol, aspirin, dexamethasone, lidocaine, lorazepam, multivitamin, potassium chloride sa, prochlorperazine, amlodipine, aspirin-salicylamide-caffeine, diphenoxylate-atropine, lisinopril, loperamide, metoprolol, morphine, oxycodone, pantoprazole, and triamterene-hydrochlorothiazide, and the following  Facility-Administered Medications: bevacizumab (AVASTIN) 375 mg in sodium chloride 0.9 % 100 mL chemo infusion, fluorouracil (ADRUCIL) 5,000 mg in sodium chloride 0.9 % 150 mL chemo infusion, fluorouracil, leucovorin 836 mg in dextrose 5 % 250 mL infusion, metoCLOPramide (REGLAN) 20 mg in dextrose 5 % 50 mL IVPB, ondansetron (ZOFRAN) 16 mg, dexamethasone (DECADRON) 20 mg in sodium chloride 0.9 % 50 mL IVPB, sodium chloride, and sodium chloride.  SURGICAL HISTORY:  Past Surgical History  Procedure Laterality Date  . Irrigation and debridement sebaceous cyst    . Laparoscopic lysis intestinal adhesions  2009    With incidental appendectomy  . Coronary artery bypass graft  2011    2 vessel  . Abdominoperineal proctocolectomy  2008  . Portacath placement  2011  . Colostomy      FAMILY HISTORY: family history includes Cancer in his maternal grandmother and mother.  SOCIAL HISTORY:  reports that he quit smoking about 3 years ago. He has never used smokeless tobacco. He reports that he does not drink alcohol or use illicit drugs.  REVIEW OF SYSTEMS:  Other than that discussed above is noncontributory.  PHYSICAL EXAMINATION: ECOG PERFORMANCE STATUS: 1 - Symptomatic but completely ambulatory  There were no vitals taken for this visit.  GENERAL:alert, no distress and comfortable SKIN: skin color, texture, turgor are normal, no rashes or significant lesions EYES: PERLA; Conjunctiva are pink and non-injected, sclera clear SINUSES: No redness or tenderness over maxillary or ethmoid sinuses. Nontender. OROPHARYNX:no exudate, no erythema on lips, buccal mucosa, or tongue. NECK: supple, thyroid normal size, non-tender, without nodularity. No masses CHEST: Increased AP diameter with light port in place. LYMPH:  no palpable lymphadenopathy in the cervical, axillary or inguinal LUNGS: clear to auscultation and percussion with normal breathing effort HEART: regular rate & rhythm and no  murmurs. ABDOMEN:abdomen soft, non-tender and normal bowel sounds. Colostomy in place with normal active bowel sounds and no ascites. MUSCULOSKELETAL:no cyanosis of digits and no clubbing. Range of motion normal.  NEURO: alert & oriented x 3 with fluent speech, no focal motor/sensory deficits. Decreased deep tendon reflexes both lower extremities.   LABORATORY DATA: Infusion on 07/24/2013  Component Date Value Ref Range Status  . WBC 07/24/2013 11.6* 4.0 - 10.5 K/uL Final  . RBC 07/24/2013 4.13* 4.22 - 5.81 MIL/uL Final  . Hemoglobin 07/24/2013 12.6* 13.0 - 17.0 g/dL Final  . HCT 07/24/2013 36.5* 39.0 - 52.0 % Final  . MCV 07/24/2013 88.4  78.0 - 100.0 fL Final  . MCH 07/24/2013 30.5  26.0 - 34.0 pg Final  . MCHC 07/24/2013 34.5  30.0 - 36.0 g/dL Final  . RDW 07/24/2013 15.3  11.5 - 15.5 % Final  . Platelets 07/24/2013 252  150 - 400 K/uL Final  . Neutrophils Relative % 07/24/2013 86* 43 - 77 % Final  . Neutro Abs 07/24/2013 9.9* 1.7 - 7.7 K/uL Final  . Lymphocytes Relative 07/24/2013 8* 12 - 46 % Final  . Lymphs Abs 07/24/2013 1.0  0.7 - 4.0 K/uL Final  . Monocytes Relative 07/24/2013 6  3 - 12 % Final  . Monocytes Absolute 07/24/2013 0.7  0.1 - 1.0 K/uL Final  . Eosinophils Relative 07/24/2013 0  0 - 5 % Final  . Eosinophils Absolute 07/24/2013 0.0  0.0 - 0.7 K/uL Final  .  Basophils Relative 07/24/2013 0  0 - 1 % Final  . Basophils Absolute 07/24/2013 0.0  0.0 - 0.1 K/uL Final  . Sodium 07/24/2013 132* 137 - 147 mEq/L Final  . Potassium 07/24/2013 4.5  3.7 - 5.3 mEq/L Final  . Chloride 07/24/2013 96  96 - 112 mEq/L Final  . CO2 07/24/2013 24  19 - 32 mEq/L Final  . Glucose, Bld 07/24/2013 100* 70 - 99 mg/dL Final  . BUN 07/24/2013 30* 6 - 23 mg/dL Final  . Creatinine, Ser 07/24/2013 1.34  0.50 - 1.35 mg/dL Final  . Calcium 07/24/2013 8.8  8.4 - 10.5 mg/dL Final  . Total Protein 07/24/2013 6.3  6.0 - 8.3 g/dL Final  . Albumin 07/24/2013 3.1* 3.5 - 5.2 g/dL Final  . AST  07/24/2013 15  0 - 37 U/L Final  . ALT 07/24/2013 15  0 - 53 U/L Final  . Alkaline Phosphatase 07/24/2013 92  39 - 117 U/L Final  . Total Bilirubin 07/24/2013 0.2* 0.3 - 1.2 mg/dL Final  . GFR calc non Af Amer 07/24/2013 58* >90 mL/min Final  . GFR calc Af Amer 07/24/2013 67* >90 mL/min Final   Comment: (NOTE)                          The eGFR has been calculated using the CKD EPI equation.                          This calculation has not been validated in all clinical situations.                          eGFR's persistently <90 mL/min signify possible Chronic Kidney                          Disease.  Marland Kitchen Specific Gravity, Urine 07/24/2013 >1.030* 1.005 - 1.030 Final  . pH 07/24/2013 5.5  5.0 - 8.0 Final  . Glucose, UA 07/24/2013 NEGATIVE  NEGATIVE mg/dL Final  . Hgb urine dipstick 07/24/2013 NEGATIVE  NEGATIVE Final  . Bilirubin Urine 07/24/2013 NEGATIVE  NEGATIVE Final  . Ketones, ur 07/24/2013 NEGATIVE  NEGATIVE mg/dL Final  . Protein, ur 07/24/2013 TRACE* NEGATIVE mg/dL Final  . Urobilinogen, UA 07/24/2013 0.2  0.0 - 1.0 mg/dL Final  . Nitrite 07/24/2013 NEGATIVE  NEGATIVE Final  . Leukocytes, UA 07/24/2013 NEGATIVE  NEGATIVE Final  Infusion on 07/17/2013  Component Date Value Ref Range Status  . WBC 07/17/2013 5.1  4.0 - 10.5 K/uL Final  . RBC 07/17/2013 4.38  4.22 - 5.81 MIL/uL Final  . Hemoglobin 07/17/2013 13.2  13.0 - 17.0 g/dL Final  . HCT 07/17/2013 38.0* 39.0 - 52.0 % Final  . MCV 07/17/2013 86.8  78.0 - 100.0 fL Final  . MCH 07/17/2013 30.1  26.0 - 34.0 pg Final  . MCHC 07/17/2013 34.7  30.0 - 36.0 g/dL Final  . RDW 07/17/2013 14.0  11.5 - 15.5 % Final  . Platelets 07/17/2013 280  150 - 400 K/uL Final  . Neutrophils Relative % 07/17/2013 62  43 - 77 % Final  . Neutro Abs 07/17/2013 3.2  1.7 - 7.7 K/uL Final  . Lymphocytes Relative 07/17/2013 20  12 - 46 % Final  . Lymphs Abs 07/17/2013 1.0  0.7 - 4.0 K/uL Final  . Monocytes Relative 07/17/2013 15* 3 - 12 % Final  .  Monocytes Absolute 07/17/2013 0.8  0.1 - 1.0 K/uL Final  . Eosinophils Relative 07/17/2013 2  0 - 5 % Final  . Eosinophils Absolute 07/17/2013 0.1  0.0 - 0.7 K/uL Final  . Basophils Relative 07/17/2013 1  0 - 1 % Final  . Basophils Absolute 07/17/2013 0.1  0.0 - 0.1 K/uL Final  . Sodium 07/17/2013 129* 137 - 147 mEq/L Final  . Potassium 07/17/2013 3.0* 3.7 - 5.3 mEq/L Final  . Chloride 07/17/2013 87* 96 - 112 mEq/L Final  . CO2 07/17/2013 26  19 - 32 mEq/L Final  . Glucose, Bld 07/17/2013 118* 70 - 99 mg/dL Final  . BUN 07/17/2013 11  6 - 23 mg/dL Final  . Creatinine, Ser 07/17/2013 1.22  0.50 - 1.35 mg/dL Final  . Calcium 07/17/2013 8.9  8.4 - 10.5 mg/dL Final  . Total Protein 07/17/2013 6.5  6.0 - 8.3 g/dL Final  . Albumin 07/17/2013 2.9* 3.5 - 5.2 g/dL Final  . AST 07/17/2013 13  0 - 37 U/L Final  . ALT 07/17/2013 10  0 - 53 U/L Final  . Alkaline Phosphatase 07/17/2013 95  39 - 117 U/L Final  . Total Bilirubin 07/17/2013 0.4  0.3 - 1.2 mg/dL Final  . GFR calc non Af Amer 07/17/2013 65* >90 mL/min Final  . GFR calc Af Amer 07/17/2013 75* >90 mL/min Final   Comment: (NOTE)                          The eGFR has been calculated using the CKD EPI equation.                          This calculation has not been validated in all clinical situations.                          eGFR's persistently <90 mL/min signify possible Chronic Kidney                          Disease.  . CEA 07/17/2013 33.9* 0.0 - 5.0 ng/mL Final   Performed at [facility]  . Specific Gravity, Urine 07/17/2013 1.020  1.005 - 1.030 Final  . pH 07/17/2013 6.0  5.0 - 8.0 Final  . Glucose, UA 07/17/2013 NEGATIVE  NEGATIVE mg/dL Final  . Hgb urine dipstick 07/17/2013 NEGATIVE  NEGATIVE Final  . Bilirubin Urine 07/17/2013 NEGATIVE  NEGATIVE Final  . Ketones, ur 07/17/2013 NEGATIVE  NEGATIVE mg/dL Final  . Protein, ur 07/17/2013 30* NEGATIVE mg/dL Final  . Urobilinogen, UA 07/17/2013 0.2  0.0 - 1.0 mg/dL Final  .  Nitrite 07/17/2013 NEGATIVE  NEGATIVE Final  . Leukocytes, UA 07/17/2013 NEGATIVE  NEGATIVE Final  Infusion on 07/03/2013  Component Date Value Ref Range Status  . WBC 07/03/2013 8.9  4.0 - 10.5 K/uL Final  . RBC 07/03/2013 3.92* 4.22 - 5.81 MIL/uL Final  . Hemoglobin 07/03/2013 11.7* 13.0 - 17.0 g/dL Final  . HCT 07/03/2013 35.1* 39.0 - 52.0 % Final  . MCV 07/03/2013 89.5  78.0 - 100.0 fL Final  . MCH 07/03/2013 29.8  26.0 - 34.0 pg Final  . MCHC 07/03/2013 33.3  30.0 - 36.0 g/dL Final  . RDW 07/03/2013 14.1  11.5 - 15.5 % Final  . Platelets 07/03/2013 462* 150 - 400 K/uL Final  . Neutrophils Relative % 07/03/2013 86* 43 -  77 % Final  . Neutro Abs 07/03/2013 7.6  1.7 - 7.7 K/uL Final  . Lymphocytes Relative 07/03/2013 8* 12 - 46 % Final  . Lymphs Abs 07/03/2013 0.7  0.7 - 4.0 K/uL Final  . Monocytes Relative 07/03/2013 6  3 - 12 % Final  . Monocytes Absolute 07/03/2013 0.5  0.1 - 1.0 K/uL Final  . Eosinophils Relative 07/03/2013 0  0 - 5 % Final  . Eosinophils Absolute 07/03/2013 0.0  0.0 - 0.7 K/uL Final  . Basophils Relative 07/03/2013 0  0 - 1 % Final  . Basophils Absolute 07/03/2013 0.0  0.0 - 0.1 K/uL Final  . Sodium 07/03/2013 138  137 - 147 mEq/L Final  . Potassium 07/03/2013 4.0  3.7 - 5.3 mEq/L Final  . Chloride 07/03/2013 97  96 - 112 mEq/L Final  . CO2 07/03/2013 26  19 - 32 mEq/L Final  . Glucose, Bld 07/03/2013 185* 70 - 99 mg/dL Final  . BUN 07/03/2013 11  6 - 23 mg/dL Final  . Creatinine, Ser 07/03/2013 1.18  0.50 - 1.35 mg/dL Final  . Calcium 07/03/2013 9.3  8.4 - 10.5 mg/dL Final  . Total Protein 07/03/2013 6.9  6.0 - 8.3 g/dL Final  . Albumin 07/03/2013 2.9* 3.5 - 5.2 g/dL Final  . AST 07/03/2013 14  0 - 37 U/L Final  . ALT 07/03/2013 9  0 - 53 U/L Final  . Alkaline Phosphatase 07/03/2013 96  39 - 117 U/L Final  . Total Bilirubin 07/03/2013 0.2* 0.3 - 1.2 mg/dL Final  . GFR calc non Af Amer 07/03/2013 67* >90 mL/min Final  . GFR calc Af Amer 07/03/2013 78* >90  mL/min Final   Comment: (NOTE)                          The eGFR has been calculated using the CKD EPI equation.                          This calculation has not been validated in all clinical situations.                          eGFR's persistently <90 mL/min signify possible Chronic Kidney                          Disease.  . CEA 07/03/2013 39.5* 0.0 - 5.0 ng/mL Final   Performed at [facility]  . Specific Gravity, Urine 07/03/2013 1.025  1.005 - 1.030 Final  . pH 07/03/2013 5.5  5.0 - 8.0 Final  . Glucose, UA 07/03/2013 NEGATIVE  NEGATIVE mg/dL Final  . Hgb urine dipstick 07/03/2013 NEGATIVE  NEGATIVE Final  . Bilirubin Urine 07/03/2013 NEGATIVE  NEGATIVE Final  . Ketones, ur 07/03/2013 NEGATIVE  NEGATIVE mg/dL Final  . Protein, ur 07/03/2013 30* NEGATIVE mg/dL Final  . Urobilinogen, UA 07/03/2013 0.2  0.0 - 1.0 mg/dL Final  . Nitrite 07/03/2013 NEGATIVE  NEGATIVE Final  . Leukocytes, UA 07/03/2013 NEGATIVE  NEGATIVE Final    PATHOLOGY: K-ras wild-type, anaphylaxis with cetuximab  Urinalysis    Component Value Date/Time   COLORURINE YELLOW 03/02/2013 1400   APPEARANCEUR CLEAR 03/02/2013 1400   LABSPEC >1.030* 07/24/2013 0826   PHURINE 5.5 07/24/2013 0826   GLUCOSEU NEGATIVE 07/24/2013 0826   HGBUR NEGATIVE 07/24/2013 0826   BILIRUBINUR NEGATIVE 07/24/2013 6644  KETONESUR NEGATIVE 07/24/2013 0826   PROTEINUR TRACE* 07/24/2013 0826   UROBILINOGEN 0.2 07/24/2013 0826   NITRITE NEGATIVE 07/24/2013 0826   LEUKOCYTESUR NEGATIVE 07/24/2013 0826    RADIOGRAPHIC STUDIES: Ct Head W Wo Contrast  07/02/2013   CLINICAL DATA:  Rectal carcinoma with dizziness and blurred vision  EXAM: CT HEAD WITHOUT AND WITH CONTRAST  TECHNIQUE: Contiguous axial images were obtained from the base of the skull through the vertex without and with intravenous contrast  CONTRAST:  80 mL Omnipaque 300 nonionic  COMPARISON:  Noncontrast study March 02, 2013  FINDINGS: The ventricles are normal in size and  configuration. There is now vasogenic edema in the lateral right cerebellum. Within this area of vasogenic edema, there is a mass with irregular rim enhancement measuring 1.5 x 1.5 cm. No other mass is appreciable on this study. There is no hemorrhage, extra-axial fluid, or midline shift. Elsewhere, gray-white compartments appear normal. No acute infarct is apparent. Mass  IMPRESSION: Mass with surrounding vasogenic edema measuring 1.5 x 1.5 cm in the lateral right cerebellum. This mass shows irregular rim enhancement. This finding is consistent with neoplasia, and in the setting of known rectal carcinoma most likely represents a metastatic focus. No other lesion identified.   Electronically Signed   By: Lowella Grip M.D.   On: 07/02/2013 10:02   Ct Chest W Contrast  07/02/2013   CLINICAL DATA:  Restaging metastatic rectal carcinoma, history of hypertension, hyperlipidemia, former smoker, alcohol abuse  EXAM: CT CHEST, ABDOMEN, AND PELVIS WITH CONTRAST  TECHNIQUE: Multidetector CT imaging of the chest, abdomen and pelvis was performed following the standard protocol during bolus administration of intravenous contrast. Sagittal and coronal MPR images reconstructed from axial data set.  CONTRAST:  44m OMNIPAQUE IOHEXOL 300 MG/ML SOLN IV. Dilute oral contrast.  COMPARISON:  CT chest 03/02/2013, CT chest abdomen and pelvis 10/17/2012  FINDINGS: CT CHEST FINDINGS  Vascular structures grossly patent on nondedicated exam.  Mediastinal and bilateral hilar adenopathy increased since previous study.  AP window lymph node 17 mm short axis image 25 unchanged.  Precarinal lymph node 20 mm short axis image 24, previously 7 mm.  Prevascular lymph node 16 mm short axis image 23, previously 6 mm.  Right hilar node at 24 mm image 29, previously 12 mm.  Postsurgical atherosclerotic disease with postsurgical changes of CABG.  Pulmonary metastases increased in sizes and number since previous study.  Index lesion superior  segment right lower lobe 5.3 x 4.0 cm image 35 previously 4.2 x 3.4 cm.  Index lesion left lower lobe 5.2 x 3.9 cm image 41, previously 4.5 x 3.4 cm.  Left pleural metastases with moderate-sized left pleural effusion increased since previous exam.  Minimal pericardial fluid and nodularity question metastatic disease.  No osseous metastases.  CT ABDOMEN AND PELVIS FINDINGS  9 mm nodule right lobe liver image 66 unchanged.  Tiny calcified granuloma within liver.  Tiny calcified gallstone.  Liver, spleen, pancreas, kidneys, and adrenal glands normal.  Lax anterior abdominal wall fascia with small supraumbilical hernia containing fat  Left lower quadrant descending colostomy post APR.  Scattered atherosclerotic calcifications.  Unremarkable bladder and prostate.  Stomach decompressed, unable to assess wall thickness.  Large and small bowel loops otherwise unremarkable.  Minimally enlarged left para-aortic lymph node 11 mm short axis image 75, new.  No mass, additional adenopathy, free fluid, free air, or acute osseous findings.  IMPRESSION: Progression of pulmonary metastatic disease and thoracic adenopathy since previous study.  New minimally enlarged  left para-aortic lymph node.  Cholelithiasis.  Stable low-attenuation right lobe liver lesion.   Electronically Signed   By: Ulyses Southward M.D.   On: 07/02/2013 09:59   Mr Laqueta Jean [facility] Contrast  07/19/2013   ADDENDUM REPORT: 07/19/2013 08:57  ADDENDUM: Patient's clinical data should read:  Rectal carcinoma.   Electronically Signed   By: Bridgett Larsson M.D.   On: 07/19/2013 08:57   07/19/2013   CLINICAL DATA:  Colon and renal cancer with metastatic disease to lung and brain lateral right cerebellar. Stereotactic radiation surgery planning exam.  EXAM: MRI HEAD WITHOUT AND WITH CONTRAST  TECHNIQUE: Multiplanar, multiecho pulse sequences of the brain and surrounding structures were obtained without and with intravenous contrast.  CONTRAST:  46mL MULTIHANCE GADOBENATE  DIMEGLUMINE 529 MG/ML IV SOLN  COMPARISON:  07/02/2013 head CT.  No comparison brain MR.  FINDINGS: 1.2 x 1 x 0.9 cm enhancing lesion lateral aspect of the right cerebellum may contain blood breakdown products and represent hemorrhagic metastatic lesion from renal cell cancer. Minimal surrounding vasogenic edema.  No other parenchymal enhancing lesions. There are areas of artifact (most notable left internal auditory canal region and frontal lobe) but without other intracranial mass identified.  No acute infarct.  No intracranial hemorrhage separate from the right cerebellar lesion.  Mild small vessel disease type changes.  Atherosclerotic type changes right vertebral artery with narrowing. Remainder of major intracranial vascular structures are patent.  IMPRESSION: 1.2 x 1 x 0.9 cm hemorrhagic enhancing lesion lateral aspect of the right cerebellum may represent metastatic lesion from renal cell cancer. Minimal surrounding vasogenic edema.  No other parenchymal enhancing lesions.  No acute infarct.  Mild small vessel disease type changes.  Atherosclerotic type changes right vertebral artery with narrowing.  Electronically Signed: By: Bridgett Larsson M.D. On: 07/16/2013 12:18   Ct Abdomen Pelvis W Contrast  07/02/2013   CLINICAL DATA:  Restaging metastatic rectal carcinoma, history of hypertension, hyperlipidemia, former smoker, alcohol abuse  EXAM: CT CHEST, ABDOMEN, AND PELVIS WITH CONTRAST  TECHNIQUE: Multidetector CT imaging of the chest, abdomen and pelvis was performed following the standard protocol during bolus administration of intravenous contrast. Sagittal and coronal MPR images reconstructed from axial data set.  CONTRAST:  12mL OMNIPAQUE IOHEXOL 300 MG/ML SOLN IV. Dilute oral contrast.  COMPARISON:  CT chest 03/02/2013, CT chest abdomen and pelvis 10/17/2012  FINDINGS: CT CHEST FINDINGS  Vascular structures grossly patent on nondedicated exam.  Mediastinal and bilateral hilar adenopathy increased since  previous study.  AP window lymph node 17 mm short axis image 25 unchanged.  Precarinal lymph node 20 mm short axis image 24, previously 7 mm.  Prevascular lymph node 16 mm short axis image 23, previously 6 mm.  Right hilar node at 24 mm image 29, previously 12 mm.  Postsurgical atherosclerotic disease with postsurgical changes of CABG.  Pulmonary metastases increased in sizes and number since previous study.  Index lesion superior segment right lower lobe 5.3 x 4.0 cm image 35 previously 4.2 x 3.4 cm.  Index lesion left lower lobe 5.2 x 3.9 cm image 41, previously 4.5 x 3.4 cm.  Left pleural metastases with moderate-sized left pleural effusion increased since previous exam.  Minimal pericardial fluid and nodularity question metastatic disease.  No osseous metastases.  CT ABDOMEN AND PELVIS FINDINGS  9 mm nodule right lobe liver image 66 unchanged.  Tiny calcified granuloma within liver.  Tiny calcified gallstone.  Liver, spleen, pancreas, kidneys, and adrenal glands normal.  Lax anterior abdominal  wall fascia with small supraumbilical hernia containing fat  Left lower quadrant descending colostomy post APR.  Scattered atherosclerotic calcifications.  Unremarkable bladder and prostate.  Stomach decompressed, unable to assess wall thickness.  Large and small bowel loops otherwise unremarkable.  Minimally enlarged left para-aortic lymph node 11 mm short axis image 75, new.  No mass, additional adenopathy, free fluid, free air, or acute osseous findings.  IMPRESSION: Progression of pulmonary metastatic disease and thoracic adenopathy since previous study.  New minimally enlarged left para-aortic lymph node.  Cholelithiasis.  Stable low-attenuation right lobe liver lesion.   Electronically Signed   By: Lavonia Dana M.D.   On: 07/02/2013 09:59    ASSESSMENT:  #1. 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 #2. Stage IV  rectal cancer with liver, lung, and brain metastases, status post stereotactic brain radiotherapy receiving 20 gray yesterday. #3. Chronic obstructive pulmonary disease #4. 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   PLAN:  #1. Discontinue irinotecan for now until [place]e mutation analysis is back. #2. Proceed with chemotherapy utilizing leucovorin and 5-FU with Avastin. Utilizing Reglan as antinauseant in addition to Zofran and dexamethasone. #3. Decrease dexamethasone 20 mg each morning in order to help with sleeplessness. #4. Reglan 10 mg 4 times a day before meals and at bedtime. #5. Use Tylenol instead of BC powders. #6. TC potassium chloride #7. Followup in one week with CBC and chem profile.    All questions were answered. The patient knows to call the clinic with any problems, questions or concerns. We can certainly see the patient much sooner if necessary.   I spent 40 minutes counseling the patient face to face. The total time spent in the appointment was 55 minutes.    Farrel Gobble, MD 07/24/2013 9:37 AM  DISCLAIMER:  This note was dictated with voice recognition software.  Similar sounding words can inadvertently be transcribed inaccurately and may not be corrected upon review.

## 2013-07-25 LAB — MISCELLANEOUS TEST

## 2013-07-25 NOTE — Progress Notes (Signed)
  Radiation Oncology         [phone] ________________________________  Name: RAEFORD BRANDENBURG MRN: [id number]  Date: 07/23/2013  DOB: [birth year]  End of Treatment Note  Diagnosis:   Right cerebellar Brain Metastasis, Rectal Cancer   Indication for treatment:  Palliative     Radiation treatment dates:   07/23/2013  Site/Beams/dose/energy:   Right cerebellar target was treated using 4 Dynamic Conformal Arcs to a prescription dose of 20 Gy with SRS.  6 FFF MV photons used.  Narrative: The patient tolerated radiation treatment relatively well.   Plan: The patient has completed radiation treatment. The patient will return to radiation oncology clinic for routine followup in one month. I advised them to call or return sooner if they have any questions or concerns related to their recovery or treatment.  -----------------------------------  Eppie Gibson, MD

## 2013-07-26 ENCOUNTER — Encounter (HOSPITAL_BASED_OUTPATIENT_CLINIC_OR_DEPARTMENT_OTHER): Payer: Medicare Other

## 2013-07-26 ENCOUNTER — Other Ambulatory Visit (HOSPITAL_COMMUNITY): Payer: Self-pay | Admitting: Oncology

## 2013-07-26 DIAGNOSIS — C20 Malignant neoplasm of rectum: Secondary | ICD-10-CM

## 2013-07-26 DIAGNOSIS — Z452 Encounter for adjustment and management of vascular access device: Secondary | ICD-10-CM

## 2013-07-26 DIAGNOSIS — B370 Candidal stomatitis: Secondary | ICD-10-CM

## 2013-07-26 MED ORDER — HEPARIN SOD (PORK) LOCK FLUSH 100 UNIT/ML IV SOLN
500.0000 [IU] | Freq: Once | INTRAVENOUS | Status: AC
  Administered 2013-07-26: 500 [IU] via INTRAVENOUS

## 2013-07-26 MED ORDER — HEPARIN SOD (PORK) LOCK FLUSH 100 UNIT/ML IV SOLN
INTRAVENOUS | Status: AC
  Filled 2013-07-26: qty 5

## 2013-07-26 MED ORDER — FLUCONAZOLE 100 MG PO TABS: 100.0000 mg | ORAL_TABLET | Freq: Every day | ORAL | Status: DC

## 2013-07-26 MED ORDER — FIRST-DUKES MOUTHWASH MT SUSP: 5.0000 mL | Freq: Four times a day (QID) | OROMUCOSAL | Status: DC | PRN

## 2013-07-26 MED ORDER — SODIUM CHLORIDE 0.9 % IJ SOLN
10.0000 mL | INTRAMUSCULAR | Status: DC | PRN
  Administered 2013-07-26: 10 mL via INTRAVENOUS

## 2013-07-26 NOTE — Progress Notes (Signed)
Martin Maldonado presented for discontinuation of ambulatory pump and portacath flush. Ambulatory pump discontinued  Portacath located right chest wall was previously accessed with  H 20 needle. Good blood return present. Portacath flushed with 60ml NS and 500U/91ml Heparin and needle removed intact. Procedure without incident. Patient tolerated procedure well.

## 2013-07-30 ENCOUNTER — Telehealth: Payer: Self-pay | Admitting: Dietician

## 2013-07-30 NOTE — Telephone Encounter (Signed)
Brief Outpatient Oncology Nutrition Note  Patient has been identified to be at risk on malnutrition screen.  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  Patient of Dr. Manuella Ghazi.  Called patient to to weight loss and noted problems with diarrhea in chart.  Patient is not available.  Message left with contact information for the [facility] number.  Antonieta Iba, RD, LDN

## 2013-07-31 ENCOUNTER — Inpatient Hospital Stay (HOSPITAL_COMMUNITY): Payer: Medicare Other

## 2013-08-02 ENCOUNTER — Encounter (HOSPITAL_COMMUNITY): Payer: Medicare Other

## 2013-08-02 ENCOUNTER — Encounter (HOSPITAL_BASED_OUTPATIENT_CLINIC_OR_DEPARTMENT_OTHER): Payer: Medicare Other

## 2013-08-02 ENCOUNTER — Encounter (HOSPITAL_COMMUNITY): Payer: Self-pay

## 2013-08-02 VITALS — BP 116/77 | HR 108 | Temp 97.9°F | Resp 18 | Wt 153.9 lb

## 2013-08-02 DIAGNOSIS — C7800 Secondary malignant neoplasm of unspecified lung: Secondary | ICD-10-CM

## 2013-08-02 DIAGNOSIS — R599 Enlarged lymph nodes, unspecified: Secondary | ICD-10-CM

## 2013-08-02 DIAGNOSIS — K209 Esophagitis, unspecified without bleeding: Secondary | ICD-10-CM

## 2013-08-02 DIAGNOSIS — C20 Malignant neoplasm of rectum: Secondary | ICD-10-CM

## 2013-08-02 DIAGNOSIS — C7931 Secondary malignant neoplasm of brain: Secondary | ICD-10-CM

## 2013-08-02 DIAGNOSIS — K8020 Calculus of gallbladder without cholecystitis without obstruction: Secondary | ICD-10-CM

## 2013-08-02 DIAGNOSIS — J449 Chronic obstructive pulmonary disease, unspecified: Secondary | ICD-10-CM

## 2013-08-02 DIAGNOSIS — E860 Dehydration: Secondary | ICD-10-CM

## 2013-08-02 DIAGNOSIS — C7949 Secondary malignant neoplasm of other parts of nervous system: Secondary | ICD-10-CM

## 2013-08-02 LAB — CBC WITH DIFFERENTIAL/PLATELET
BASOS PCT: 0 % (ref 0–1)
Basophils Absolute: 0 10*3/uL (ref 0.0–0.1)
EOS PCT: 0 % (ref 0–5)
Eosinophils Absolute: 0 10*3/uL (ref 0.0–0.7)
HEMATOCRIT: 41.3 % (ref 39.0–52.0)
HEMOGLOBIN: 13.9 g/dL (ref 13.0–17.0)
LYMPHS PCT: 7 % — AB (ref 12–46)
Lymphs Abs: 0.8 10*3/uL (ref 0.7–4.0)
MCH: 30 pg (ref 26.0–34.0)
MCHC: 33.7 g/dL (ref 30.0–36.0)
MCV: 89 fL (ref 78.0–100.0)
MONO ABS: 0.6 10*3/uL (ref 0.1–1.0)
MONOS PCT: 5 % (ref 3–12)
NEUTROS ABS: 10.6 10*3/uL — AB (ref 1.7–7.7)
Neutrophils Relative %: 88 % — ABNORMAL HIGH (ref 43–77)
Platelets: 248 10*3/uL (ref 150–400)
RBC: 4.64 MIL/uL (ref 4.22–5.81)
RDW: 15.8 % — ABNORMAL HIGH (ref 11.5–15.5)
WBC: 12.1 10*3/uL — ABNORMAL HIGH (ref 4.0–10.5)

## 2013-08-02 LAB — COMPREHENSIVE METABOLIC PANEL
ALT: 18 U/L (ref 0–53)
AST: 16 U/L (ref 0–37)
Albumin: 3.7 g/dL (ref 3.5–5.2)
Alkaline Phosphatase: 89 U/L (ref 39–117)
BUN: 37 mg/dL — AB (ref 6–23)
CALCIUM: 9.4 mg/dL (ref 8.4–10.5)
CO2: 24 meq/L (ref 19–32)
CREATININE: 1.38 mg/dL — AB (ref 0.50–1.35)
Chloride: 100 mEq/L (ref 96–112)
GFR, EST AFRICAN AMERICAN: 65 mL/min — AB (ref >90)
GFR, EST NON AFRICAN AMERICAN: 56 mL/min — AB (ref >90)
Glucose, Bld: 122 mg/dL — ABNORMAL HIGH (ref 70–99)
Potassium: 4.7 mEq/L (ref 3.7–5.3)
Sodium: 140 mEq/L (ref 137–147)
Total Bilirubin: 0.5 mg/dL (ref 0.3–1.2)
Total Protein: 7.1 g/dL (ref 6.0–8.3)

## 2013-08-02 MED ORDER — KCL IN DEXTROSE-NACL 20-5-0.45 MEQ/L-%-% IV SOLN
INTRAVENOUS | Status: DC
  Filled 2013-08-02 (×4): qty 1000

## 2013-08-02 MED ORDER — DEXTROSE 5 % IV SOLN
10.0000 mg/kg | Freq: Once | INTRAVENOUS | Status: DC
  Filled 2013-08-02: qty 14

## 2013-08-02 NOTE — Progress Notes (Signed)
[facility]  OFFICE PROGRESS NOTE  [name], MD [place] [id number]  DIAGNOSIS: Dehydration  Chief Complaint  Patient presents with  . Colon cancer metastatic to liver, lung, and brain    CURRENT THERAPY: Leucovorin/5-FU/Avastin after one cycle of FOLFIRI resulted in severe diarrhea. SBRT to brain metastasis in the cerebellum on 07/23/2013 (20 gray). UGT1 mutation analysis normal.  INTERVAL HISTORY: [facility] 58 y.o. male returns for followup after receiving initial chemotherapy last week for stage IV colon cancer with liver, abdominal lymph node, lung, and brain metastases. Irinotecan was omitted because of previous severe diarrhea associated with therapy given 3 weeks before that.  His have intense esophageal pain with resolution of his oral pain. He denies any diarrhea. He is unable to keep down liquids and solids for the last 3 days and has had no fever, night sweats, headache, or seizure activity. He has barely been able to swallow his pills. He continues on lidocaine rinse along with nystatin and Diflucan. He does feel dizzy when assuming an erect position. He denies any cough, wheezing, or as previously noted intense night sweats.  MEDICAL HISTORY: Past Medical History  Diagnosis Date  . Hypertension   . Rectal cancer 2008    Adenocarcinoma; AP resection in 2008; recurrence with lung metastases in 2011  . Arteriosclerotic cardiovascular disease (ASCVD) 2011    CABG surgery in 2011  . Hyperlipidemia   . 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 Splenomegaly 01/23/2009    Additional GI history includes small bowel obstruction, requiring laparotomy and lysis of adhesions in 2009, remote peptic ulcer disease and postoperative perirectal infection  . ALCOHOL ABUSE, HX OF 01/23/2009  . Brain metastasis 07/02/2013    INTERIM HISTORY: has HYPERLIPIDEMIA; Tobacco abuse, in  remission; HYPERTENSION; COPD; SPLENOMEGALY; SMALL BOWEL OBSTRUCTION, HX OF; Rectal cancer; CAD (coronary artery disease) of artery bypass graft; and Brain metastasis on his problem list.    ALLERGIES:  is allergic to erbitux.  MEDICATIONS: has a current medication list which includes the following prescription(s): albuterol, aspirin, dexamethasone, first-dukes mouthwash, fluconazole, lidocaine, lorazepam, metoclopramide, multivitamin, oxycodone, prochlorperazine, amlodipine, aspirin-salicylamide-caffeine, diphenoxylate-atropine, lisinopril, loperamide, metoprolol, morphine, pantoprazole, potassium chloride sa, and triamterene-hydrochlorothiazide, and the following Facility-Administered Medications: acyclovir (ZOVIRAX) 700 mg in dextrose 5 % 100 mL IVPB, dextrose 5 % and 0.45 % nacl with kcl 20 meq/l, and sodium chloride.  SURGICAL HISTORY:  Past Surgical History  Procedure Laterality Date  . Irrigation and debridement sebaceous cyst    . Laparoscopic lysis intestinal adhesions  2009    With incidental appendectomy  . Coronary artery bypass graft  2011    2 vessel  . Abdominoperineal proctocolectomy  2008  . Portacath placement  2011  . Colostomy      FAMILY HISTORY: family history includes Cancer in his maternal grandmother and mother.  SOCIAL HISTORY:  reports that he quit smoking about 3 years ago. He has never used smokeless tobacco. He reports that he does not drink alcohol or use illicit drugs.  REVIEW OF SYSTEMS:  Other than that discussed above is noncontributory.  PHYSICAL EXAMINATION: ECOG PERFORMANCE STATUS: 3 - Symptomatic, >50% confined to bed  Blood pressure 116/77, pulse 108, temperature 97.9 F (36.6 C), temperature source Oral, resp. rate 18, weight 153 lb 14.4 oz (69.809 kg).  GENERAL:alert, no distress and comfortable SKIN: skin color, texture, turgor are normal, no rashes or  significant lesions EYES: PERLA; Conjunctiva are pink and non-injected, sclera  clear SINUSES: No redness or tenderness over maxillary or ethmoid sinuses OROPHARYNX:no exudate, no erythema on lips, buccal mucosa, or tongue. No evidence of blasts or vesicles in the oral cavity or oropharynx. NECK: supple, thyroid normal size, non-tender, without nodularity. No masses CHEST: Increased AP diameter with light port in place. LYMPH:  no palpable lymphadenopathy in the cervical, axillary or inguinal LUNGS: clear to auscultation and percussion with normal breathing effort HEART: regular rate & rhythm and no murmurs. ABDOMEN:abdomen soft, non-tender and normal bowel sounds. No distention, CVA tenderness, or free fluid wave. Colostomy in place without stomal bleeding or erythema. MUSCULOSKELETAL:no cyanosis of digits and no clubbing. Range of motion normal.  NEURO: alert & oriented x 3 with fluent speech, no focal motor/sensory deficits. No evidence of asterixis.   LABORATORY DATA: Appointment on 08/02/2013  Component Date Value Ref Range Status  . WBC 08/02/2013 12.1* 4.0 - 10.5 K/uL Final  . RBC 08/02/2013 4.64  4.22 - 5.81 MIL/uL Final  . Hemoglobin 08/02/2013 13.9  13.0 - 17.0 g/dL Final  . HCT 08/02/2013 41.3  39.0 - 52.0 % Final  . MCV 08/02/2013 89.0  78.0 - 100.0 fL Final  . MCH 08/02/2013 30.0  26.0 - 34.0 pg Final  . MCHC 08/02/2013 33.7  30.0 - 36.0 g/dL Final  . RDW 08/02/2013 15.8* 11.5 - 15.5 % Final  . Platelets 08/02/2013 248  150 - 400 K/uL Final  . Neutrophils Relative % 08/02/2013 88* 43 - 77 % Final  . Neutro Abs 08/02/2013 10.6* 1.7 - 7.7 K/uL Final  . Lymphocytes Relative 08/02/2013 7* 12 - 46 % Final  . Lymphs Abs 08/02/2013 0.8  0.7 - 4.0 K/uL Final  . Monocytes Relative 08/02/2013 5  3 - 12 % Final  . Monocytes Absolute 08/02/2013 0.6  0.1 - 1.0 K/uL Final  . Eosinophils Relative 08/02/2013 0  0 - 5 % Final  . Eosinophils Absolute 08/02/2013 0.0  0.0 - 0.7 K/uL Final  . Basophils Relative 08/02/2013 0  0 - 1 % Final  . Basophils Absolute  08/02/2013 0.0  0.0 - 0.1 K/uL Final  Infusion on 07/24/2013  Component Date Value Ref Range Status  . WBC 07/24/2013 11.6* 4.0 - 10.5 K/uL Final  . RBC 07/24/2013 4.13* 4.22 - 5.81 MIL/uL Final  . Hemoglobin 07/24/2013 12.6* 13.0 - 17.0 g/dL Final  . HCT 07/24/2013 36.5* 39.0 - 52.0 % Final  . MCV 07/24/2013 88.4  78.0 - 100.0 fL Final  . MCH 07/24/2013 30.5  26.0 - 34.0 pg Final  . MCHC 07/24/2013 34.5  30.0 - 36.0 g/dL Final  . RDW 07/24/2013 15.3  11.5 - 15.5 % Final  . Platelets 07/24/2013 252  150 - 400 K/uL Final  . Neutrophils Relative % 07/24/2013 86* 43 - 77 % Final  . Neutro Abs 07/24/2013 9.9* 1.7 - 7.7 K/uL Final  . Lymphocytes Relative 07/24/2013 8* 12 - 46 % Final  . Lymphs Abs 07/24/2013 1.0  0.7 - 4.0 K/uL Final  . Monocytes Relative 07/24/2013 6  3 - 12 % Final  . Monocytes Absolute 07/24/2013 0.7  0.1 - 1.0 K/uL Final  . Eosinophils Relative 07/24/2013 0  0 - 5 % Final  . Eosinophils Absolute 07/24/2013 0.0  0.0 - 0.7 K/uL Final  . Basophils Relative 07/24/2013 0  0 - 1 % Final  . Basophils Absolute 07/24/2013 0.0  0.0 - 0.1 K/uL Final  .  Sodium 07/24/2013 132* 137 - 147 mEq/L Final  . Potassium 07/24/2013 4.5  3.7 - 5.3 mEq/L Final  . Chloride 07/24/2013 96  96 - 112 mEq/L Final  . CO2 07/24/2013 24  19 - 32 mEq/L Final  . Glucose, Bld 07/24/2013 100* 70 - 99 mg/dL Final  . BUN 07/24/2013 30* 6 - 23 mg/dL Final  . Creatinine, Ser 07/24/2013 1.34  0.50 - 1.35 mg/dL Final  . Calcium 07/24/2013 8.8  8.4 - 10.5 mg/dL Final  . Total Protein 07/24/2013 6.3  6.0 - 8.3 g/dL Final  . Albumin 07/24/2013 3.1* 3.5 - 5.2 g/dL Final  . AST 07/24/2013 15  0 - 37 U/L Final  . ALT 07/24/2013 15  0 - 53 U/L Final  . Alkaline Phosphatase 07/24/2013 92  39 - 117 U/L Final  . Total Bilirubin 07/24/2013 0.2* 0.3 - 1.2 mg/dL Final  . GFR calc non Af Amer 07/24/2013 58* >90 mL/min Final  . GFR calc Af Amer 07/24/2013 67* >90 mL/min Final   Comment: (NOTE)                           The eGFR has been calculated using the CKD EPI equation.                          This calculation has not been validated in all clinical situations.                          eGFR's persistently <90 mL/min signify possible Chronic Kidney                          Disease.  . CEA 07/24/2013 41.7* 0.0 - 5.0 ng/mL Final   Performed at [facility]  . Specific Gravity, Urine 07/24/2013 >1.030* 1.005 - 1.030 Final  . pH 07/24/2013 5.5  5.0 - 8.0 Final  . Glucose, UA 07/24/2013 NEGATIVE  NEGATIVE mg/dL Final  . Hgb urine dipstick 07/24/2013 NEGATIVE  NEGATIVE Final  . Bilirubin Urine 07/24/2013 NEGATIVE  NEGATIVE Final  . Ketones, ur 07/24/2013 NEGATIVE  NEGATIVE mg/dL Final  . Protein, ur 07/24/2013 TRACE* NEGATIVE mg/dL Final  . Urobilinogen, UA 07/24/2013 0.2  0.0 - 1.0 mg/dL Final  . Nitrite 07/24/2013 NEGATIVE  NEGATIVE Final  . Leukocytes, UA 07/24/2013 NEGATIVE  NEGATIVE Final  Infusion on 07/17/2013  Component Date Value Ref Range Status  . WBC 07/17/2013 5.1  4.0 - 10.5 K/uL Final  . RBC 07/17/2013 4.38  4.22 - 5.81 MIL/uL Final  . Hemoglobin 07/17/2013 13.2  13.0 - 17.0 g/dL Final  . HCT 07/17/2013 38.0* 39.0 - 52.0 % Final  . MCV 07/17/2013 86.8  78.0 - 100.0 fL Final  . MCH 07/17/2013 30.1  26.0 - 34.0 pg Final  . MCHC 07/17/2013 34.7  30.0 - 36.0 g/dL Final  . RDW 07/17/2013 14.0  11.5 - 15.5 % Final  . Platelets 07/17/2013 280  150 - 400 K/uL Final  . Neutrophils Relative % 07/17/2013 62  43 - 77 % Final  . Neutro Abs 07/17/2013 3.2  1.7 - 7.7 K/uL Final  . Lymphocytes Relative 07/17/2013 20  12 - 46 % Final  . Lymphs Abs 07/17/2013 1.0  0.7 - 4.0 K/uL Final  . Monocytes Relative 07/17/2013 15* 3 - 12 % Final  . Monocytes Absolute 07/17/2013 0.8  0.1 - 1.0 K/uL Final  . Eosinophils Relative 07/17/2013 2  0 - 5 % Final  . Eosinophils Absolute 07/17/2013 0.1  0.0 - 0.7 K/uL Final  . Basophils Relative 07/17/2013 1  0 - 1 % Final  . Basophils Absolute 07/17/2013 0.1   0.0 - 0.1 K/uL Final  . Sodium 07/17/2013 129* 137 - 147 mEq/L Final  . Potassium 07/17/2013 3.0* 3.7 - 5.3 mEq/L Final  . Chloride 07/17/2013 87* 96 - 112 mEq/L Final  . CO2 07/17/2013 26  19 - 32 mEq/L Final  . Glucose, Bld 07/17/2013 118* 70 - 99 mg/dL Final  . BUN 07/17/2013 11  6 - 23 mg/dL Final  . Creatinine, Ser 07/17/2013 1.22  0.50 - 1.35 mg/dL Final  . Calcium 07/17/2013 8.9  8.4 - 10.5 mg/dL Final  . Total Protein 07/17/2013 6.5  6.0 - 8.3 g/dL Final  . Albumin 07/17/2013 2.9* 3.5 - 5.2 g/dL Final  . AST 07/17/2013 13  0 - 37 U/L Final  . ALT 07/17/2013 10  0 - 53 U/L Final  . Alkaline Phosphatase 07/17/2013 95  39 - 117 U/L Final  . Total Bilirubin 07/17/2013 0.4  0.3 - 1.2 mg/dL Final  . GFR calc non Af Amer 07/17/2013 65* >90 mL/min Final  . GFR calc Af Amer 07/17/2013 75* >90 mL/min Final   Comment: (NOTE)                          The eGFR has been calculated using the CKD EPI equation.                          This calculation has not been validated in all clinical situations.                          eGFR's persistently <90 mL/min signify possible Chronic Kidney                          Disease.  . CEA 07/17/2013 33.9* 0.0 - 5.0 ng/mL Final   Performed at [facility]  . Specific Gravity, Urine 07/17/2013 1.020  1.005 - 1.030 Final  . pH 07/17/2013 6.0  5.0 - 8.0 Final  . Glucose, UA 07/17/2013 NEGATIVE  NEGATIVE mg/dL Final  . Hgb urine dipstick 07/17/2013 NEGATIVE  NEGATIVE Final  . Bilirubin Urine 07/17/2013 NEGATIVE  NEGATIVE Final  . Ketones, ur 07/17/2013 NEGATIVE  NEGATIVE mg/dL Final  . Protein, ur 07/17/2013 30* NEGATIVE mg/dL Final  . Urobilinogen, UA 07/17/2013 0.2  0.0 - 1.0 mg/dL Final  . Nitrite 07/17/2013 NEGATIVE  NEGATIVE Final  . Leukocytes, UA 07/17/2013 NEGATIVE  NEGATIVE Final  . Miscellaneous Test 07/17/2013 TEST [id number]   Final  . Miscellaneous Test Results 07/17/2013 SEE BELOW   Final   Comment: (NOTE)                           TEST                          ======================================================================                          UDP Glucuronosyltransferase 1A1 (UGT1A1) Genotyping  [facility] test code [id number]                          UGT1A1 Genotyping Specimen Whole Blood                          UGT1A1 Genotyping [place] 1 (TA)6 or *1                          UGT1A1 Genotyping [place] 2 (TA)6 or *1                          UGT1A1 Genotyping Interpretation         SEE NOTE                          --------------------------------         --------                          Indications for ordering:                          - Determine sensitivity to irinotecan or related compounds.                          - Confirm a diagnosis of Gilbert Syndrome.                          Homozygous UGT1A1 (TA)6: Two copies of the UGT1A1 *1 (TA)6 were                          detected. This is associated with normal UGT1A1 enzyme levels.                          This genotype has not been associated with Gilberts syndrome                          (benign familial hyperbilirubinemia).                          This result has been reviewed and approved by Anabel Halon, M.D., Ph.D.                          BACKGROUND INFORMATION: UDP Glucuronosyltransferase 1A1 (UGT1A1)                          Genotyping                          CHARACTERISTICS: UGT1A1 is responsible for the clearance of                          drugs (e.g., irinotecan) and endobiotic compounds (e.g.,  bilirubin). Irinotecan's major active and toxic metabolite                          (SN-38) is inactivated by the UGT1A1 enzyme and then eliminated                          via the bile. UGT1A1 gene mutations cause accumulation of SN-38,                          which may lead to irinotecan-related toxicities (neutropenia,                          diarrhea).                           CAUSE: Variations in TA repeat number in the TATAAA element of                          the 5'UGT1A1-promoter affects transcription efficiency. The                          common number of repeats is six [(TA)6, *1 allele], while seven                          repeats [(TA)7, *28 allele] is associated with reduced                          transcription activity. Homozygosity for the (TA)7 allele is                          also associated with Rosanna Duwane Syndrome (benign familial                          hyperbilirubinemia).                          ALLELES TESTED: *36 allele, (TA)5; *1 allele, (TA)6; *28                          alelle, (TA)7 and *37 allele, (TA)8.                          CLINICAL SENSTIVITY/SPECIFICITY: Risk of irinotecan toxicity by                          genotype (Br J Cancer (2004) 36:629-47).                          6/6 (*1/*1): diarrhea 17 percent; neutropenia 15 percent                          6/7 (*1/*28): diarrhea 33 percent; neutropenia 27 percent                          7/7 (*28/*28): diarrhea 70 percent; neutropenia 40 percent  ALLELIC FREQUENCY:                          *1(TA)6: Caucasians 0.61, Asians 0.84, African Americans 0.47                          *28(TA)7: Caucasians 0.39, Asians 0.16, African Americans 0.43                          METHODOLOGY: Polymerase chain reaction followed by size analysis                          using capillary electrophoresis.                          ANALYTICAL SENSTIVITY: Greater than 99 percent.                          LIMITATIONS: Variations in the UGT1A1 gene, other than those                          targeted, will not be detected. Clinical significance of the                          rare *36, (TA)5 and *37, (TA)8 alleles in predicting irinotecan                          toxicities is not well established. Genetic and non-genetic                          factors other than UGT1A1,  may contribute to irinotecan toxicity                          and efficacy. Diagnostic errors can occur due to rare sequence                          variations.                          Test developed and characteristics determined by [facility] ([place]). 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  Urinalysis    Component Value Date/Time   COLORURINE YELLOW 03/02/2013 1400   APPEARANCEUR CLEAR 03/02/2013 1400   LABSPEC >1.030* 07/24/2013 0826   PHURINE 5.5 07/24/2013 0826   GLUCOSEU NEGATIVE 07/24/2013 0826   HGBUR NEGATIVE 07/24/2013 0826   BILIRUBINUR NEGATIVE 07/24/2013 0826   KETONESUR NEGATIVE 07/24/2013 0826   PROTEINUR TRACE* 07/24/2013 0826   UROBILINOGEN 0.2 07/24/2013 0826   NITRITE NEGATIVE 07/24/2013 0826   LEUKOCYTESUR NEGATIVE 07/24/2013 0826    RADIOGRAPHIC STUDIES: Mr Jeri Cos OY Contrast  08-01-2013   ADDENDUM REPORT: Aug 01, 2013 08:57  ADDENDUM: Patient's clinical data should read:  Rectal carcinoma.   Electronically Signed   By: Chauncey Cruel M.D.   On: Aug 01, 2013 08:57   08/01/2013   CLINICAL DATA:  Colon and renal cancer with metastatic disease to lung and brain lateral right cerebellar. Stereotactic radiation surgery planning exam.  EXAM: MRI HEAD WITHOUT AND WITH CONTRAST  TECHNIQUE: Multiplanar, multiecho pulse sequences of the brain and surrounding structures were obtained without and with intravenous contrast.  CONTRAST:  68mL MULTIHANCE GADOBENATE DIMEGLUMINE 529 MG/ML IV SOLN  COMPARISON:  07/02/2013 head CT.  No comparison brain MR.  FINDINGS: 1.2 x 1 x 0.9 cm enhancing lesion lateral aspect of the right cerebellum may contain blood breakdown products and represent hemorrhagic  metastatic lesion from renal cell cancer. Minimal surrounding vasogenic edema.  No other parenchymal enhancing lesions. There are areas of artifact (most notable left internal auditory canal region and frontal lobe) but without other intracranial mass identified.  No acute infarct.  No intracranial hemorrhage separate from the right cerebellar lesion.  Mild small vessel disease type changes.  Atherosclerotic type changes right vertebral artery with narrowing. Remainder of major intracranial vascular structures are patent.  IMPRESSION: 1.2 x 1 x 0.9 cm hemorrhagic enhancing lesion lateral aspect of the right cerebellum may represent metastatic lesion from renal cell cancer. Minimal surrounding vasogenic edema.  No other parenchymal enhancing lesions.  No acute infarct.  Mild small vessel disease type changes.  Atherosclerotic type changes right vertebral artery with narrowing.  Electronically Signed: By: Chauncey Cruel M.D. On: 07/16/2013 12:18    ASSESSMENT:  #1. Severe esophagitis with resolution of oral mucositis, possibly due to viral esophagitis secondary to previous chemotherapy. #2. Stage IV rectal cancer with liver, abdominal lymph node, lung, and brain metastases, status post SBRT to a singular cerebellar lesion 20 gray on 07/23/2013. #3. Chronic obstructive pulmonary disease. #4. Cholelithiasis, asymptomatic.   PLAN:  #1. Decrease dexamethasone to 4 mg daily. #2. IV fluids on 5/15 and 08/06/2013 along with intravenous acyclovir. #3. Continue Diflucan and oxycodone orally. Continued use lidocaine mouthwash low with nystatin.  #4. Patient is due for treatment again on 08/07/2013 which will probably be held. Dosage of leucovorin/5-FU will also be decreased. Apparently the patient's long battle with his malignancy has made him less tolerant of treatments that he tolerated extremely well in the past.   All questions were answered. The patient knows to call the clinic with any problems, questions or  concerns. We can certainly see the patient much sooner if necessary.   I spent 25 minutes counseling the patient face to face. The total time spent in the appointment was 30 minutes.    Farrel Gobble, MD 08/02/2013 4:01 PM  DISCLAIMER:  This note was dictated with voice recognition software.  Similar sounding words can inadvertently be transcribed inaccurately and may not be  corrected upon review.

## 2013-08-02 NOTE — Patient Instructions (Addendum)
[facility] Discharge Instructions  RECOMMENDATIONS MADE BY THE CONSULTANT AND ANY TEST RESULTS WILL BE SENT TO YOUR REFERRING PHYSICIAN.  EXAM FINDINGS BY THE PHYSICIAN TODAY AND SIGNS OR SYMPTOMS TO REPORT TO CLINIC OR PRIMARY PHYSICIAN: Exam and findings as discussed by Dr. Barnet Glasgow.  Will bring you back for fluids on Friday and Monday.  Could have viral type infection causing the throat discomfort & we will give you an anti-viral intravenously tomorrow.   Can also be related to the 5 FU infusion and MD will make adjustments to your therapy. In the future, if you are having problems, call us early so we can try to do some things to help you feel better.  MEDICATIONS PRESCRIBED:  Decrease dexamethasone (decadron) to 1 tablet daily Use oxycodone for pain as needed Continue the diflucan  INSTRUCTIONS/FOLLOW-UP: Follow-up tomorrow and Monday for IV fluids and office visit.  Thank you for choosing [facility] to provide your oncology and hematology care.  To afford each patient quality time with our providers, please arrive at least 15 minutes before your scheduled appointment time.  With your help, our goal is to use those 15 minutes to complete the necessary work-up to ensure our physicians have the information they need to help with your evaluation and healthcare recommendations.    Effective January 1st, 2014, we ask that you re-schedule your appointment with our physicians should you arrive 10 or more minutes late for your appointment.  We strive to give you quality time with our providers, and arriving late affects you and other patients whose appointments are after yours.    Again, thank you for choosing [facility].  Our hope is that these requests will decrease the amount of time that you wait before being seen by our physicians.       _____________________________________________________________  Should you have questions after your visit  to [facility], please contact our office at [phone] between the hours of 8:30 a.m. and 5:00 p.m.  Voicemails left after 4:30 p.m. will not be returned until the following business day.  For prescription refill requests, have your pharmacy contact our office with your prescription refill request.

## 2013-08-03 ENCOUNTER — Other Ambulatory Visit (HOSPITAL_COMMUNITY): Payer: Self-pay | Admitting: Hematology and Oncology

## 2013-08-03 ENCOUNTER — Encounter (HOSPITAL_BASED_OUTPATIENT_CLINIC_OR_DEPARTMENT_OTHER): Payer: Medicare Other

## 2013-08-03 VITALS — BP 138/89 | HR 71 | Temp 97.6°F | Resp 18

## 2013-08-03 DIAGNOSIS — C7800 Secondary malignant neoplasm of unspecified lung: Secondary | ICD-10-CM

## 2013-08-03 DIAGNOSIS — C7931 Secondary malignant neoplasm of brain: Secondary | ICD-10-CM

## 2013-08-03 DIAGNOSIS — C20 Malignant neoplasm of rectum: Secondary | ICD-10-CM

## 2013-08-03 DIAGNOSIS — K209 Esophagitis, unspecified without bleeding: Secondary | ICD-10-CM

## 2013-08-03 DIAGNOSIS — C7949 Secondary malignant neoplasm of other parts of nervous system: Secondary | ICD-10-CM

## 2013-08-03 DIAGNOSIS — Z95828 Presence of other vascular implants and grafts: Secondary | ICD-10-CM

## 2013-08-03 MED ORDER — SUCRALFATE 1 GM/10ML PO SUSP
1.0000 g | Freq: Three times a day (TID) | ORAL | Status: AC
  Filled 2013-08-03 (×5): qty 10

## 2013-08-03 MED ORDER — REGORAFENIB 40 MG PO TABS: ORAL_TABLET | ORAL | Status: DC

## 2013-08-03 MED ORDER — DEXTROSE-NACL 5-0.9 % IV SOLN
INTRAVENOUS | Status: DC
Start: 2013-08-03 — End: 2013-08-03
  Filled 2013-08-03 (×8): qty 1000

## 2013-08-03 MED ORDER — DEXTROSE 5 % IV SOLN
10.0000 mg/kg | Freq: Once | INTRAVENOUS | Status: AC
  Administered 2013-08-03: 700 mg via INTRAVENOUS
  Filled 2013-08-03: qty 14

## 2013-08-03 MED ORDER — SUCRALFATE 1 GM/10ML PO SUSP
1.0000 g | Freq: Three times a day (TID) | ORAL | Status: DC
  Administered 2013-08-03: 1 g via ORAL
  Filled 2013-08-03 (×5): qty 10

## 2013-08-03 MED ORDER — HEPARIN SOD (PORK) LOCK FLUSH 100 UNIT/ML IV SOLN
500.0000 [IU] | Freq: Once | INTRAVENOUS | Status: AC
  Administered 2013-08-03: 500 [IU] via INTRAVENOUS
  Filled 2013-08-03: qty 5

## 2013-08-03 MED ORDER — SODIUM CHLORIDE 0.9 % IJ SOLN
10.0000 mL | INTRAMUSCULAR | Status: DC | PRN
  Administered 2013-08-03: 10 mL via INTRAVENOUS

## 2013-08-03 MED ORDER — KCL IN DEXTROSE-NACL 20-5-0.9 MEQ/L-%-% IV SOLN
INTRAVENOUS | Status: AC
  Administered 2013-08-03: 10:00:00 via INTRAVENOUS
  Filled 2013-08-03: qty 1000

## 2013-08-03 MED ORDER — KCL IN DEXTROSE-NACL 20-5-0.45 MEQ/L-%-% IV SOLN
INTRAVENOUS | Status: DC
  Filled 2013-08-03 (×8): qty 1000

## 2013-08-03 NOTE — Progress Notes (Signed)
Patient states no relief of sore throat with Carafate today. Not taking any liquids in except a few sips. Returns Monday for fluids.

## 2013-08-06 ENCOUNTER — Encounter (HOSPITAL_COMMUNITY): Payer: Self-pay

## 2013-08-06 ENCOUNTER — Encounter (HOSPITAL_BASED_OUTPATIENT_CLINIC_OR_DEPARTMENT_OTHER): Payer: Medicare Other

## 2013-08-06 ENCOUNTER — Other Ambulatory Visit (HOSPITAL_COMMUNITY): Payer: Self-pay | Admitting: Hematology and Oncology

## 2013-08-06 VITALS — BP 117/75 | HR 107 | Temp 97.3°F | Resp 18 | Wt 148.3 lb

## 2013-08-06 DIAGNOSIS — C7800 Secondary malignant neoplasm of unspecified lung: Secondary | ICD-10-CM

## 2013-08-06 DIAGNOSIS — C189 Malignant neoplasm of colon, unspecified: Secondary | ICD-10-CM

## 2013-08-06 DIAGNOSIS — K209 Esophagitis, unspecified without bleeding: Secondary | ICD-10-CM

## 2013-08-06 DIAGNOSIS — C787 Secondary malignant neoplasm of liver and intrahepatic bile duct: Secondary | ICD-10-CM

## 2013-08-06 DIAGNOSIS — C20 Malignant neoplasm of rectum: Secondary | ICD-10-CM

## 2013-08-06 DIAGNOSIS — C7949 Secondary malignant neoplasm of other parts of nervous system: Secondary | ICD-10-CM

## 2013-08-06 DIAGNOSIS — J449 Chronic obstructive pulmonary disease, unspecified: Secondary | ICD-10-CM

## 2013-08-06 DIAGNOSIS — C7931 Secondary malignant neoplasm of brain: Secondary | ICD-10-CM

## 2013-08-06 DIAGNOSIS — K8020 Calculus of gallbladder without cholecystitis without obstruction: Secondary | ICD-10-CM

## 2013-08-06 LAB — COMPREHENSIVE METABOLIC PANEL
ALT: 19 U/L (ref 0–53)
AST: 18 U/L (ref 0–37)
Albumin: 3.2 g/dL — ABNORMAL LOW (ref 3.5–5.2)
Alkaline Phosphatase: 88 U/L (ref 39–117)
BILIRUBIN TOTAL: 0.8 mg/dL (ref 0.3–1.2)
BUN: 34 mg/dL — AB (ref 6–23)
CO2: 21 meq/L (ref 19–32)
Calcium: 8.8 mg/dL (ref 8.4–10.5)
Chloride: 99 mEq/L (ref 96–112)
Creatinine, Ser: 1.25 mg/dL (ref 0.50–1.35)
GFR calc Af Amer: 73 mL/min — ABNORMAL LOW (ref >90)
GFR, EST NON AFRICAN AMERICAN: 63 mL/min — AB (ref >90)
GLUCOSE: 218 mg/dL — AB (ref 70–99)
POTASSIUM: 4.6 meq/L (ref 3.7–5.3)
SODIUM: 134 meq/L — AB (ref 137–147)
Total Protein: 6.6 g/dL (ref 6.0–8.3)

## 2013-08-06 LAB — CBC WITH DIFFERENTIAL/PLATELET
Basophils Absolute: 0 10*3/uL (ref 0.0–0.1)
Basophils Relative: 0 % (ref 0–1)
Eosinophils Absolute: 0 10*3/uL (ref 0.0–0.7)
Eosinophils Relative: 0 % (ref 0–5)
HCT: 38.9 % — ABNORMAL LOW (ref 39.0–52.0)
HEMOGLOBIN: 13.1 g/dL (ref 13.0–17.0)
LYMPHS ABS: 0.5 10*3/uL — AB (ref 0.7–4.0)
LYMPHS PCT: 7 % — AB (ref 12–46)
MCH: 30.2 pg (ref 26.0–34.0)
MCHC: 33.7 g/dL (ref 30.0–36.0)
MCV: 89.6 fL (ref 78.0–100.0)
Monocytes Absolute: 0.3 10*3/uL (ref 0.1–1.0)
Monocytes Relative: 5 % (ref 3–12)
NEUTROS ABS: 5.4 10*3/uL (ref 1.7–7.7)
NEUTROS PCT: 88 % — AB (ref 43–77)
Platelets: 165 10*3/uL (ref 150–400)
RBC: 4.34 MIL/uL (ref 4.22–5.81)
RDW: 16.7 % — ABNORMAL HIGH (ref 11.5–15.5)
WBC: 6.1 10*3/uL (ref 4.0–10.5)

## 2013-08-06 LAB — MAGNESIUM: MAGNESIUM: 2.4 mg/dL (ref 1.5–2.5)

## 2013-08-06 MED ORDER — DEXTROSE 5 % IV SOLN
700.0000 mg | Freq: Once | INTRAVENOUS | Status: AC
  Administered 2013-08-06: 700 mg via INTRAVENOUS
  Filled 2013-08-06: qty 14

## 2013-08-06 MED ORDER — POTASSIUM CHLORIDE 20 MEQ/15ML (10%) PO SOLN
20.0000 meq | Freq: Every day | ORAL | Status: DC
  Filled 2013-08-06 (×2): qty 15

## 2013-08-06 MED ORDER — SODIUM CHLORIDE 0.9 % IJ SOLN
10.0000 mL | INTRAMUSCULAR | Status: DC | PRN
  Administered 2013-08-06: 10 mL via INTRAVENOUS

## 2013-08-06 MED ORDER — HEPARIN SOD (PORK) LOCK FLUSH 100 UNIT/ML IV SOLN
500.0000 [IU] | Freq: Once | INTRAVENOUS | Status: AC
  Administered 2013-08-06: 500 [IU] via INTRAVENOUS

## 2013-08-06 MED ORDER — POTASSIUM CHLORIDE 2 MEQ/ML IV SOLN
INTRAVENOUS | Status: AC
  Filled 2013-08-06: qty 1000

## 2013-08-06 MED ORDER — HEPARIN SOD (PORK) LOCK FLUSH 100 UNIT/ML IV SOLN
INTRAVENOUS | Status: AC
  Filled 2013-08-06: qty 5

## 2013-08-06 MED ORDER — DEXTROSE-NACL 5-0.9 % IV SOLN
INTRAVENOUS | Status: AC
  Filled 2013-08-06 (×8): qty 1000

## 2013-08-06 MED ORDER — KCL IN DEXTROSE-NACL 20-5-0.9 MEQ/L-%-% IV SOLN
INTRAVENOUS | Status: AC
  Administered 2013-08-06: 12:00:00 via INTRAVENOUS
  Filled 2013-08-06: qty 1000

## 2013-08-06 NOTE — Patient Instructions (Signed)
[facility] Discharge Instructions  RECOMMENDATIONS MADE BY THE CONSULTANT AND ANY TEST RESULTS WILL BE SENT TO YOUR REFERRING PHYSICIAN.  EXAM FINDINGS BY THE PHYSICIAN TODAY AND SIGNS OR SYMPTOMS TO REPORT TO CLINIC OR PRIMARY PHYSICIAN: Exam and findings as discussed by Dr. Barnet Glasgow.  Will stop the IV chemotherapy and will start you on Stivarga after you are able to eat and drink better.  Will give you IV fluids today and see you back on 08/09/13. Report uncontrolled nausea, vomiting or other problems.  MEDICATIONS PRESCRIBED:  none  INSTRUCTIONS/FOLLOW-UP: Follow-up on 08/09/13.  Thank you for choosing [facility] to provide your oncology and hematology care.  To afford each patient quality time with our providers, please arrive at least 15 minutes before your scheduled appointment time.  With your help, our goal is to use those 15 minutes to complete the necessary work-up to ensure our physicians have the information they need to help with your evaluation and healthcare recommendations.    Effective January 1st, 2014, we ask that you re-schedule your appointment with our physicians should you arrive 10 or more minutes late for your appointment.  We strive to give you quality time with our providers, and arriving late affects you and other patients whose appointments are after yours.    Again, thank you for choosing [facility].  Our hope is that these requests will decrease the amount of time that you wait before being seen by our physicians.       _____________________________________________________________  Should you have questions after your visit to [facility], please contact our office at [phone] between the hours of 8:30 a.m. and 5:00 p.m.  Voicemails left after 4:30 p.m. will not be returned until the following business day.  For prescription refill requests, have your pharmacy contact our office with your  prescription refill request.

## 2013-08-06 NOTE — Progress Notes (Signed)
[facility]  OFFICE PROGRESS NOTE  [name], [place] [id number]  DIAGNOSIS: Colon cancer - Plan: CBC with Differential, Comprehensive metabolic panel, Magnesium  Chief Complaint  Patient presents with  . Mucositis and esophagitis  . For colon cancer with liver, lung, and brain metastases    CURRENT THERAPY: One cycle of FOLFIRI/Avastin resulting in severe diarrhea, second cycle without irinotecan producing severe mucositis, currently receiving IV fluids and acyclovir. Carafate file unsuccessful.   INTERVAL HISTORY: Martin Maldonado 58 y.o. male returns for followup of severe esophagitis after receiving leucovorin/5-FU plus Avastin for progressive colon cancer with lung, brain, and liver metastases. After taking Mylanta, he was able to the scrambled eggs and a grilled cheese sandwich. He is also tolerating oral fluids. He denies any PND, orthopnea, palpitations, drenching night sweats, peripheral paresthesias or worsening, diarrhea, skin rash, headache, or seizures.   MEDICAL HISTORY: Past Medical History  Diagnosis Date  . Hypertension   . Rectal cancer 2008    Adenocarcinoma; AP resection in 2008; recurrence with lung metastases in 2011  . Arteriosclerotic cardiovascular disease (ASCVD) 2011    CABG surgery in 2011  . Hyperlipidemia   . 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 Splenomegaly 01/23/2009    Additional GI history includes small bowel obstruction, requiring laparotomy and lysis of adhesions in 2009, remote peptic ulcer disease and postoperative perirectal infection  . ALCOHOL ABUSE, HX OF 01/23/2009  . Brain metastasis 07/02/2013    INTERIM HISTORY: has HYPERLIPIDEMIA; Tobacco abuse, in remission; HYPERTENSION; COPD; SPLENOMEGALY; SMALL BOWEL OBSTRUCTION, HX OF; Rectal cancer; CAD (coronary artery disease) of artery bypass graft; and Brain metastasis on his  problem list.    ALLERGIES:  is allergic to erbitux.  MEDICATIONS: has a current medication list which includes the following prescription(s): albuterol, aspirin, dexamethasone, first-dukes mouthwash, fluconazole, lidocaine, lorazepam, multivitamin, pantoprazole, prochlorperazine, amlodipine, aspirin-salicylamide-caffeine, diphenoxylate-atropine, lisinopril, loperamide, metoclopramide, metoprolol, morphine, oxycodone, potassium chloride sa, regorafenib, and triamterene-hydrochlorothiazide, and the following Facility-Administered Medications: acyclovir (ZOVIRAX) 700 mg in dextrose 5 % 100 mL IVPB, potassium chloride, sodium chloride, and sucralfate.  SURGICAL HISTORY:  Past Surgical History  Procedure Laterality Date  . Irrigation and debridement sebaceous cyst    . Laparoscopic lysis intestinal adhesions  2009    With incidental appendectomy  . Coronary artery bypass graft  2011    2 vessel  . Abdominoperineal proctocolectomy  2008  . Portacath placement  2011  . Colostomy      FAMILY HISTORY: family history includes Cancer in his maternal grandmother and mother.  SOCIAL HISTORY:  reports that he quit smoking about 3 years ago. He has never used smokeless tobacco. He reports that he does not drink alcohol or use illicit drugs.  REVIEW OF SYSTEMS:  Other than that discussed above is noncontributory.  PHYSICAL EXAMINATION: ECOG PERFORMANCE STATUS: 2 - Symptomatic, <50% confined to bed  Blood pressure 117/75, pulse 107, temperature 97.3 F (36.3 C), temperature source Oral, resp. rate 18, weight 148 lb 4.8 oz (67.268 kg).  GENERAL:alert, no distress and comfortable.  SKIN: skin color, texture, turgor are normal, no rashes or significant lesions EYES: PERLA; Conjunctiva are pink and non-injected, sclera clear SINUSES: No redness or tenderness over maxillary or ethmoid sinuses OROPHARYNX:no exudate, no erythema on lips, buccal mucosa, or tongue. NECK: supple, thyroid normal size,  non-tender, without nodularity. No masses CHEST: Increased AP diameter with light  port in place.  LYMPH:  no palpable lymphadenopathy in the cervical, axillary or inguinal LUNGS: clear to auscultation and percussion with normal breathing effort HEART: regular rate & rhythm and no murmurs. ABDOMEN:abdomen soft, non-tender and normal bowel sounds MUSCULOSKELETAL:no cyanosis of digits and no clubbing. Range of motion normal.  NEURO: alert & oriented x 3 with fluent speech, no focal motor/sensory deficits   LABORATORY DATA: Infusion on 08/06/2013  Component Date Value Ref Range Status  . WBC 08/06/2013 6.1  4.0 - 10.5 K/uL Final  . RBC 08/06/2013 4.34  4.22 - 5.81 MIL/uL Final  . Hemoglobin 08/06/2013 13.1  13.0 - 17.0 g/dL Final  . HCT 08/06/2013 38.9* 39.0 - 52.0 % Final  . MCV 08/06/2013 89.6  78.0 - 100.0 fL Final  . MCH 08/06/2013 30.2  26.0 - 34.0 pg Final  . MCHC 08/06/2013 33.7  30.0 - 36.0 g/dL Final  . RDW 08/06/2013 16.7* 11.5 - 15.5 % Final  . Platelets 08/06/2013 165  150 - 400 K/uL Final  . Neutrophils Relative % 08/06/2013 88* 43 - 77 % Final  . Neutro Abs 08/06/2013 5.4  1.7 - 7.7 K/uL Final  . Lymphocytes Relative 08/06/2013 7* 12 - 46 % Final  . Lymphs Abs 08/06/2013 0.5* 0.7 - 4.0 K/uL Final  . Monocytes Relative 08/06/2013 5  3 - 12 % Final  . Monocytes Absolute 08/06/2013 0.3  0.1 - 1.0 K/uL Final  . Eosinophils Relative 08/06/2013 0  0 - 5 % Final  . Eosinophils Absolute 08/06/2013 0.0  0.0 - 0.7 K/uL Final  . Basophils Relative 08/06/2013 0  0 - 1 % Final  . Basophils Absolute 08/06/2013 0.0  0.0 - 0.1 K/uL Final  Appointment on 08/02/2013  Component Date Value Ref Range Status  . WBC 08/02/2013 12.1* 4.0 - 10.5 K/uL Final  . RBC 08/02/2013 4.64  4.22 - 5.81 MIL/uL Final  . Hemoglobin 08/02/2013 13.9  13.0 - 17.0 g/dL Final  . HCT 08/02/2013 41.3  39.0 - 52.0 % Final  . MCV 08/02/2013 89.0  78.0 - 100.0 fL Final  . MCH 08/02/2013 30.0  26.0 - 34.0 pg Final    . MCHC 08/02/2013 33.7  30.0 - 36.0 g/dL Final  . RDW 08/02/2013 15.8* 11.5 - 15.5 % Final  . Platelets 08/02/2013 248  150 - 400 K/uL Final  . Neutrophils Relative % 08/02/2013 88* 43 - 77 % Final  . Neutro Abs 08/02/2013 10.6* 1.7 - 7.7 K/uL Final  . Lymphocytes Relative 08/02/2013 7* 12 - 46 % Final  . Lymphs Abs 08/02/2013 0.8  0.7 - 4.0 K/uL Final  . Monocytes Relative 08/02/2013 5  3 - 12 % Final  . Monocytes Absolute 08/02/2013 0.6  0.1 - 1.0 K/uL Final  . Eosinophils Relative 08/02/2013 0  0 - 5 % Final  . Eosinophils Absolute 08/02/2013 0.0  0.0 - 0.7 K/uL Final  . Basophils Relative 08/02/2013 0  0 - 1 % Final  . Basophils Absolute 08/02/2013 0.0  0.0 - 0.1 K/uL Final  . Sodium 08/02/2013 140  137 - 147 mEq/L Final  . Potassium 08/02/2013 4.7  3.7 - 5.3 mEq/L Final  . Chloride 08/02/2013 100  96 - 112 mEq/L Final  . CO2 08/02/2013 24  19 - 32 mEq/L Final  . Glucose, Bld 08/02/2013 122* 70 - 99 mg/dL Final  . BUN 08/02/2013 37* 6 - 23 mg/dL Final  . Creatinine, Ser 08/02/2013 1.38* 0.50 - 1.35 mg/dL Final  .  Calcium 08/02/2013 9.4  8.4 - 10.5 mg/dL Final  . Total Protein 08/02/2013 7.1  6.0 - 8.3 g/dL Final  . Albumin 08/02/2013 3.7  3.5 - 5.2 g/dL Final  . AST 08/02/2013 16  0 - 37 U/L Final  . ALT 08/02/2013 18  0 - 53 U/L Final  . Alkaline Phosphatase 08/02/2013 89  39 - 117 U/L Final  . Total Bilirubin 08/02/2013 0.5  0.3 - 1.2 mg/dL Final  . GFR calc non Af Amer 08/02/2013 56* >90 mL/min Final  . GFR calc Af Amer 08/02/2013 65* >90 mL/min Final   Comment: (NOTE)                          The eGFR has been calculated using the CKD EPI equation.                          This calculation has not been validated in all clinical situations.                          eGFR's persistently <90 mL/min signify possible Chronic Kidney                          Disease.  Infusion on 07/24/2013  Component Date Value Ref Range Status  . WBC 07/24/2013 11.6* 4.0 - 10.5 K/uL Final  .  RBC 07/24/2013 4.13* 4.22 - 5.81 MIL/uL Final  . Hemoglobin 07/24/2013 12.6* 13.0 - 17.0 g/dL Final  . HCT 07/24/2013 36.5* 39.0 - 52.0 % Final  . MCV 07/24/2013 88.4  78.0 - 100.0 fL Final  . MCH 07/24/2013 30.5  26.0 - 34.0 pg Final  . MCHC 07/24/2013 34.5  30.0 - 36.0 g/dL Final  . RDW 07/24/2013 15.3  11.5 - 15.5 % Final  . Platelets 07/24/2013 252  150 - 400 K/uL Final  . Neutrophils Relative % 07/24/2013 86* 43 - 77 % Final  . Neutro Abs 07/24/2013 9.9* 1.7 - 7.7 K/uL Final  . Lymphocytes Relative 07/24/2013 8* 12 - 46 % Final  . Lymphs Abs 07/24/2013 1.0  0.7 - 4.0 K/uL Final  . Monocytes Relative 07/24/2013 6  3 - 12 % Final  . Monocytes Absolute 07/24/2013 0.7  0.1 - 1.0 K/uL Final  . Eosinophils Relative 07/24/2013 0  0 - 5 % Final  . Eosinophils Absolute 07/24/2013 0.0  0.0 - 0.7 K/uL Final  . Basophils Relative 07/24/2013 0  0 - 1 % Final  . Basophils Absolute 07/24/2013 0.0  0.0 - 0.1 K/uL Final  . Sodium 07/24/2013 132* 137 - 147 mEq/L Final  . Potassium 07/24/2013 4.5  3.7 - 5.3 mEq/L Final  . Chloride 07/24/2013 96  96 - 112 mEq/L Final  . CO2 07/24/2013 24  19 - 32 mEq/L Final  . Glucose, Bld 07/24/2013 100* 70 - 99 mg/dL Final  . BUN 07/24/2013 30* 6 - 23 mg/dL Final  . Creatinine, Ser 07/24/2013 1.34  0.50 - 1.35 mg/dL Final  . Calcium 07/24/2013 8.8  8.4 - 10.5 mg/dL Final  . Total Protein 07/24/2013 6.3  6.0 - 8.3 g/dL Final  . Albumin 07/24/2013 3.1* 3.5 - 5.2 g/dL Final  . AST 07/24/2013 15  0 - 37 U/L Final  . ALT 07/24/2013 15  0 - 53 U/L Final  . Alkaline Phosphatase 07/24/2013 92  39 - 117  U/L Final  . Total Bilirubin 07/24/2013 0.2* 0.3 - 1.2 mg/dL Final  . GFR calc non Af Amer 07/24/2013 58* >90 mL/min Final  . GFR calc Af Amer 07/24/2013 67* >90 mL/min Final   Comment: (NOTE)                          The eGFR has been calculated using the CKD EPI equation.                          This calculation has not been validated in all clinical situations.                           eGFR's persistently <90 mL/min signify possible Chronic Kidney                          Disease.  . CEA 07/24/2013 41.7* 0.0 - 5.0 ng/mL Final   Performed at [facility]  . Specific Gravity, Urine 07/24/2013 >1.030* 1.005 - 1.030 Final  . pH 07/24/2013 5.5  5.0 - 8.0 Final  . Glucose, UA 07/24/2013 NEGATIVE  NEGATIVE mg/dL Final  . Hgb urine dipstick 07/24/2013 NEGATIVE  NEGATIVE Final  . Bilirubin Urine 07/24/2013 NEGATIVE  NEGATIVE Final  . Ketones, ur 07/24/2013 NEGATIVE  NEGATIVE mg/dL Final  . Protein, ur 07/24/2013 TRACE* NEGATIVE mg/dL Final  . Urobilinogen, UA 07/24/2013 0.2  0.0 - 1.0 mg/dL Final  . Nitrite 07/24/2013 NEGATIVE  NEGATIVE Final  . Leukocytes, UA 07/24/2013 NEGATIVE  NEGATIVE Final  Infusion on 07/17/2013  Component Date Value Ref Range Status  . WBC 07/17/2013 5.1  4.0 - 10.5 K/uL Final  . RBC 07/17/2013 4.38  4.22 - 5.81 MIL/uL Final  . Hemoglobin 07/17/2013 13.2  13.0 - 17.0 g/dL Final  . HCT 07/17/2013 38.0* 39.0 - 52.0 % Final  . MCV 07/17/2013 86.8  78.0 - 100.0 fL Final  . MCH 07/17/2013 30.1  26.0 - 34.0 pg Final  . MCHC 07/17/2013 34.7  30.0 - 36.0 g/dL Final  . RDW 07/17/2013 14.0  11.5 - 15.5 % Final  . Platelets 07/17/2013 280  150 - 400 K/uL Final  . Neutrophils Relative % 07/17/2013 62  43 - 77 % Final  . Neutro Abs 07/17/2013 3.2  1.7 - 7.7 K/uL Final  . Lymphocytes Relative 07/17/2013 20  12 - 46 % Final  . Lymphs Abs 07/17/2013 1.0  0.7 - 4.0 K/uL Final  . Monocytes Relative 07/17/2013 15* 3 - 12 % Final  . Monocytes Absolute 07/17/2013 0.8  0.1 - 1.0 K/uL Final  . Eosinophils Relative 07/17/2013 2  0 - 5 % Final  . Eosinophils Absolute 07/17/2013 0.1  0.0 - 0.7 K/uL Final  . Basophils Relative 07/17/2013 1  0 - 1 % Final  . Basophils Absolute 07/17/2013 0.1  0.0 - 0.1 K/uL Final  . Sodium 07/17/2013 129* 137 - 147 mEq/L Final  . Potassium 07/17/2013 3.0* 3.7 - 5.3 mEq/L Final  . Chloride 07/17/2013 87* 96 -  112 mEq/L Final  . CO2 07/17/2013 26  19 - 32 mEq/L Final  . Glucose, Bld 07/17/2013 118* 70 - 99 mg/dL Final  . BUN 07/17/2013 11  6 - 23 mg/dL Final  . Creatinine, Ser 07/17/2013 1.22  0.50 - 1.35 mg/dL Final  . Calcium 07/17/2013 8.9  8.4 - 10.5 mg/dL  Final  . Total Protein 07/17/2013 6.5  6.0 - 8.3 g/dL Final  . Albumin 07/17/2013 2.9* 3.5 - 5.2 g/dL Final  . AST 07/17/2013 13  0 - 37 U/L Final  . ALT 07/17/2013 10  0 - 53 U/L Final  . Alkaline Phosphatase 07/17/2013 95  39 - 117 U/L Final  . Total Bilirubin 07/17/2013 0.4  0.3 - 1.2 mg/dL Final  . GFR calc non Af Amer 07/17/2013 65* >90 mL/min Final  . GFR calc Af Amer 07/17/2013 75* >90 mL/min Final   Comment: (NOTE)                          The eGFR has been calculated using the CKD EPI equation.                          This calculation has not been validated in all clinical situations.                          eGFR's persistently <90 mL/min signify possible Chronic Kidney                          Disease.  . CEA 07/17/2013 33.9* 0.0 - 5.0 ng/mL Final   Performed at [facility]  . Specific Gravity, Urine 07/17/2013 1.020  1.005 - 1.030 Final  . pH 07/17/2013 6.0  5.0 - 8.0 Final  . Glucose, UA 07/17/2013 NEGATIVE  NEGATIVE mg/dL Final  . Hgb urine dipstick 07/17/2013 NEGATIVE  NEGATIVE Final  . Bilirubin Urine 07/17/2013 NEGATIVE  NEGATIVE Final  . Ketones, ur 07/17/2013 NEGATIVE  NEGATIVE mg/dL Final  . Protein, ur 07/17/2013 30* NEGATIVE mg/dL Final  . Urobilinogen, UA 07/17/2013 0.2  0.0 - 1.0 mg/dL Final  . Nitrite 07/17/2013 NEGATIVE  NEGATIVE Final  . Leukocytes, UA 07/17/2013 NEGATIVE  NEGATIVE Final  . Miscellaneous Test 07/17/2013 TEST [id number]   Final  . Miscellaneous Test Results 07/17/2013 SEE BELOW   Final   Comment: (NOTE)                          TEST                          ======================================================================                          UDP Glucuronosyltransferase  1A1 (UGT1A1) Genotyping                          [facility] test code [id number]                          UGT1A1 Genotyping Specimen Whole Blood                          UGT1A1 Genotyping [place] 1 (TA)6 or *1                          UGT1A1 Genotyping [place] 2 (TA)6 or *1                          UGT1A1 Genotyping Interpretation  SEE NOTE                          --------------------------------         --------                          Indications for ordering:                          - Determine sensitivity to irinotecan or related compounds.                          - Confirm a diagnosis of Gilbert Syndrome.                          Homozygous UGT1A1 (TA)6: Two copies of the UGT1A1 *1 (TA)6 were                          detected. This is associated with normal UGT1A1 enzyme levels.                          This genotype has not been associated with Gilberts syndrome                          (benign familial hyperbilirubinemia).                          This result has been reviewed and approved by Anabel Halon, M.D., Ph.D.                          BACKGROUND INFORMATION: UDP Glucuronosyltransferase 1A1 (UGT1A1)                          Genotyping                          CHARACTERISTICS: UGT1A1 is responsible for the clearance of                          drugs (e.g., irinotecan) and endobiotic compounds (e.g.,                          bilirubin). Irinotecan's major active and toxic metabolite                          (SN-38) is inactivated by the UGT1A1 enzyme and then eliminated                          via the bile. UGT1A1 gene mutations cause accumulation of SN-38,                          which may lead to irinotecan-related toxicities (neutropenia,  diarrhea).                          CAUSE: Variations in TA repeat number in the TATAAA element of                          the 5'UGT1A1-promoter affects transcription efficiency.  The                          common number of repeats is six [(TA)6, *1 allele], while seven                          repeats [(TA)7, *28 allele] is associated with reduced                          transcription activity. Homozygosity for the (TA)7 allele is                          also associated with Rosanna Denym Syndrome (benign familial                          hyperbilirubinemia).                          ALLELES TESTED: *36 allele, (TA)5; *1 allele, (TA)6; *28                          alelle, (TA)7 and *37 allele, (TA)8.                          CLINICAL SENSTIVITY/SPECIFICITY: Risk of irinotecan toxicity by                          genotype (Br J Cancer (2004) 17:001-74).                          6/6 (*1/*1): diarrhea 17 percent; neutropenia 15 percent                          6/7 (*1/*28): diarrhea 33 percent; neutropenia 27 percent                          7/7 (*28/*28): diarrhea 70 percent; neutropenia 40 percent                          ALLELIC FREQUENCY:                          *1(TA)6: Caucasians 0.61, Asians 0.84, African Americans 0.47                          *28(TA)7: Caucasians 0.39, Asians 0.16, African Americans 0.43                          METHODOLOGY: Polymerase chain reaction followed by size analysis  using capillary electrophoresis.                          ANALYTICAL SENSTIVITY: Greater than 99 percent.                          LIMITATIONS: Variations in the UGT1A1 gene, other than those                          targeted, will not be detected. Clinical significance of the                          rare *36, (TA)5 and *37, (TA)8 alleles in predicting irinotecan                          toxicities is not well established. Genetic and non-genetic                          factors other than UGT1A1, may contribute to irinotecan toxicity                          and efficacy. Diagnostic errors can occur due to rare sequence                           variations.                          Test developed and characteristics determined by [facility]. See Compliance Statement C: [email or web address]                          Performed at:    [place] [address]                          Performed at [facility]    PATHOLOGY:No new pathology. K-ras is wild-type.   Urinalysis    Component Value Date/Time   COLORURINE YELLOW 03/02/2013 1400   APPEARANCEUR CLEAR 03/02/2013 1400   LABSPEC >1.030* 07/24/2013 0826   PHURINE 5.5 07/24/2013 0826   GLUCOSEU NEGATIVE 07/24/2013 0826   HGBUR NEGATIVE 07/24/2013 0826   BILIRUBINUR NEGATIVE 07/24/2013 0826   KETONESUR NEGATIVE 07/24/2013 0826   PROTEINUR TRACE* 07/24/2013 0826   UROBILINOGEN 0.2 07/24/2013 0826   NITRITE NEGATIVE 07/24/2013 0826   LEUKOCYTESUR NEGATIVE 07/24/2013 0826    RADIOGRAPHIC STUDIES: Mr Jeri Cos UX Contrast  07/26/13   ADDENDUM REPORT: 2013/07/26 08:57  ADDENDUM: Patient's clinical data should read:  Rectal carcinoma.   Electronically  Signed   By: Chauncey Cruel M.D.   On: 07/19/2013 08:57   07/19/2013   CLINICAL DATA:  Colon and renal cancer with metastatic disease to lung and brain lateral right cerebellar. Stereotactic radiation surgery planning exam.  EXAM: MRI HEAD WITHOUT AND WITH CONTRAST  TECHNIQUE: Multiplanar, multiecho pulse sequences of the brain and surrounding structures were obtained without and with intravenous contrast.  CONTRAST:  79m MULTIHANCE GADOBENATE DIMEGLUMINE 529 MG/ML IV SOLN  COMPARISON:  07/02/2013 head CT.  No comparison brain MR.  FINDINGS: 1.2 x 1 x 0.9 cm enhancing lesion lateral aspect of the right cerebellum may contain blood breakdown products and represent hemorrhagic metastatic lesion from renal cell cancer. Minimal surrounding vasogenic edema.  No other parenchymal enhancing lesions. There are areas of artifact  (most notable left internal auditory canal region and frontal lobe) but without other intracranial mass identified.  No acute infarct.  No intracranial hemorrhage separate from the right cerebellar lesion.  Mild small vessel disease type changes.  Atherosclerotic type changes right vertebral artery with narrowing. Remainder of major intracranial vascular structures are patent.  IMPRESSION: 1.2 x 1 x 0.9 cm hemorrhagic enhancing lesion lateral aspect of the right cerebellum may represent metastatic lesion from renal cell cancer. Minimal surrounding vasogenic edema.  No other parenchymal enhancing lesions.  No acute infarct.  Mild small vessel disease type changes.  Atherosclerotic type changes right vertebral artery with narrowing.  Electronically Signed: By: SChauncey CruelM.D. On: 07/16/2013 12:18    ASSESSMENT:  #1. Severe esophagitis with resolution of oral mucositis, possibly due to viral esophagitis secondary to previous chemotherapy, improved on Mylanta and 1 dose of acyclovir. #2. Stage IV rectal cancer with liver, abdominal lymph node, lung, and brain metastases, status post SBRT to a singular cerebellar lesion 20 gray on 07/23/2013.  #3. Chronic obstructive pulmonary disease.  #4. Cholelithiasis, asymptomatic    PLAN:  #1. IV fluids with acyclovir today. #2. Discontinue leucovorin 5-FU with Avastin. #3. Continue progression of diet with followup on 08/09/2013 with CBC and chem profile, late in the afternoon because of a family commitment. #4. Chemotherapy with stivarga will be started after patient improves oral intake.    All questions were answered. The patient knows to call the clinic with any problems, questions or concerns. We can certainly see the patient much sooner if necessary.   I spent 25 minutes counseling the patient face to face. The total time spent in the appointment was 30 minutes.    GFarrel Gobble MD 08/06/2013 10:54 AM  DISCLAIMER:  This note was dictated with  voice recognition software.  Similar sounding words can inadvertently be transcribed inaccurately and may not be corrected upon review.

## 2013-08-07 ENCOUNTER — Inpatient Hospital Stay (HOSPITAL_COMMUNITY): Payer: Medicare Other

## 2013-08-09 ENCOUNTER — Encounter (HOSPITAL_COMMUNITY): Payer: Medicare Other

## 2013-08-09 ENCOUNTER — Encounter (HOSPITAL_BASED_OUTPATIENT_CLINIC_OR_DEPARTMENT_OTHER): Payer: Medicare Other

## 2013-08-09 ENCOUNTER — Encounter (HOSPITAL_COMMUNITY): Payer: Self-pay

## 2013-08-09 ENCOUNTER — Encounter: Payer: Self-pay | Admitting: Radiation Oncology

## 2013-08-09 VITALS — BP 111/77 | HR 88 | Temp 98.2°F | Resp 16 | Wt 154.3 lb

## 2013-08-09 DIAGNOSIS — K208 Other esophagitis without bleeding: Secondary | ICD-10-CM

## 2013-08-09 DIAGNOSIS — J449 Chronic obstructive pulmonary disease, unspecified: Secondary | ICD-10-CM

## 2013-08-09 DIAGNOSIS — C20 Malignant neoplasm of rectum: Secondary | ICD-10-CM

## 2013-08-09 DIAGNOSIS — C7931 Secondary malignant neoplasm of brain: Secondary | ICD-10-CM

## 2013-08-09 DIAGNOSIS — C7949 Secondary malignant neoplasm of other parts of nervous system: Secondary | ICD-10-CM

## 2013-08-09 DIAGNOSIS — C787 Secondary malignant neoplasm of liver and intrahepatic bile duct: Secondary | ICD-10-CM

## 2013-08-09 DIAGNOSIS — C189 Malignant neoplasm of colon, unspecified: Secondary | ICD-10-CM

## 2013-08-09 DIAGNOSIS — C7800 Secondary malignant neoplasm of unspecified lung: Secondary | ICD-10-CM

## 2013-08-09 DIAGNOSIS — C772 Secondary and unspecified malignant neoplasm of intra-abdominal lymph nodes: Secondary | ICD-10-CM

## 2013-08-09 LAB — CBC WITH DIFFERENTIAL/PLATELET
Basophils Absolute: 0 10*3/uL (ref 0.0–0.1)
Basophils Relative: 0 % (ref 0–1)
EOS ABS: 0 10*3/uL (ref 0.0–0.7)
Eosinophils Relative: 0 % (ref 0–5)
HCT: 38.4 % — ABNORMAL LOW (ref 39.0–52.0)
HEMOGLOBIN: 13.1 g/dL (ref 13.0–17.0)
LYMPHS ABS: 0.6 10*3/uL — AB (ref 0.7–4.0)
LYMPHS PCT: 12 % (ref 12–46)
MCH: 30.5 pg (ref 26.0–34.0)
MCHC: 34.1 g/dL (ref 30.0–36.0)
MCV: 89.5 fL (ref 78.0–100.0)
MONOS PCT: 13 % — AB (ref 3–12)
Monocytes Absolute: 0.7 10*3/uL (ref 0.1–1.0)
NEUTROS ABS: 4 10*3/uL (ref 1.7–7.7)
Neutrophils Relative %: 75 % (ref 43–77)
PLATELETS: 146 10*3/uL — AB (ref 150–400)
RBC: 4.29 MIL/uL (ref 4.22–5.81)
RDW: 17.2 % — ABNORMAL HIGH (ref 11.5–15.5)
WBC: 5.3 10*3/uL (ref 4.0–10.5)

## 2013-08-09 LAB — COMPREHENSIVE METABOLIC PANEL
ALBUMIN: 3.3 g/dL — AB (ref 3.5–5.2)
ALK PHOS: 92 U/L (ref 39–117)
ALT: 18 U/L (ref 0–53)
AST: 16 U/L (ref 0–37)
BILIRUBIN TOTAL: 0.4 mg/dL (ref 0.3–1.2)
BUN: 19 mg/dL (ref 6–23)
CHLORIDE: 97 meq/L (ref 96–112)
CO2: 25 mEq/L (ref 19–32)
Calcium: 9 mg/dL (ref 8.4–10.5)
Creatinine, Ser: 0.99 mg/dL (ref 0.50–1.35)
GFR calc Af Amer: >90 mL/min (ref >90)
GFR calc non Af Amer: 90 mL/min — ABNORMAL LOW (ref >90)
GLUCOSE: 94 mg/dL (ref 70–99)
POTASSIUM: 4.7 meq/L (ref 3.7–5.3)
SODIUM: 136 meq/L — AB (ref 137–147)
Total Protein: 6.7 g/dL (ref 6.0–8.3)

## 2013-08-09 NOTE — Patient Instructions (Signed)
[facility] Discharge Instructions  RECOMMENDATIONS MADE BY THE CONSULTANT AND ANY TEST RESULTS WILL BE SENT TO YOUR REFERRING PHYSICIAN.  EXAM FINDINGS BY THE PHYSICIAN TODAY AND SIGNS OR SYMPTOMS TO REPORT TO CLINIC OR PRIMARY PHYSICIAN: Exam and findings as discussed by Dr. Barnet Glasgow.  Will continue to hold your chemotherapy for 2 more weeks.  If there are any issues with your labs we will contact you.  Report any uncontrolled nausea, vomiting or other issues. If you receive the stivarga bring it with you on your next visit.  Do not start it until we see you in follow-up.  MEDICATIONS PRESCRIBED:  Use the compazine for the nausea  INSTRUCTIONS/FOLLOW-UP: Follow-up in 2 weeks with labs and office visit.  Thank you for choosing [facility] to provide your oncology and hematology care.  To afford each patient quality time with our providers, please arrive at least 15 minutes before your scheduled appointment time.  With your help, our goal is to use those 15 minutes to complete the necessary work-up to ensure our physicians have the information they need to help with your evaluation and healthcare recommendations.    Effective January 1st, 2014, we ask that you re-schedule your appointment with our physicians should you arrive 10 or more minutes late for your appointment.  We strive to give you quality time with our providers, and arriving late affects you and other patients whose appointments are after yours.    Again, thank you for choosing [facility].  Our hope is that these requests will decrease the amount of time that you wait before being seen by our physicians.       _____________________________________________________________  Should you have questions after your visit to [facility], please contact our office at [phone] between the hours of 8:30 a.m. and 5:00 p.m.  Voicemails left after 4:30 p.m. will not be returned  until the following business day.  For prescription refill requests, have your pharmacy contact our office with your prescription refill request.

## 2013-08-09 NOTE — Progress Notes (Signed)
[facility]  OFFICE PROGRESS NOTE  [name], MD [place] [id number]  DIAGNOSIS: Colon cancer - Plan: CBC with Differential, Comprehensive metabolic panel  Chief Complaint  Patient presents with  . Esophagitis  . 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  INTERVAL HISTORY: Martin Maldonado 58 y.o. male returns for followup after developing severe esophagitis from leucovorin/5-FU plus Avastin which was substituted for FOLFIRI plus Avastin because of severe diarrhea from the former. IV fluids were given along with acyclovir since Diflucan was not producing adequate control. Plan is to abandon 5-FU based therapy and to institute treatment with Stivarga after the patient's esophagitis resolves. He has gained 6 pounds in the last 3 days. He is able to eat without pain on swallowing. He's had slight nausea but no vomiting, diarrhea, melena, hematochezia, hematuria, or abdominal distention. He also denies any appreciation of food sticking when he swallows. He denies any fever, night sweats, headache, or focal weakness.  MEDICAL HISTORY: Past Medical History  Diagnosis Date  . Hypertension   . Rectal cancer 2008    Adenocarcinoma; AP resection in 2008; recurrence with lung metastases in 2011  . Arteriosclerotic cardiovascular disease (ASCVD) 2011    CABG surgery in 2011  . Hyperlipidemia   . 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 Splenomegaly 01/23/2009    Additional GI history includes small bowel obstruction, requiring laparotomy and lysis of adhesions in 2009, remote peptic ulcer disease and postoperative perirectal infection  . ALCOHOL ABUSE, HX OF 01/23/2009  . Brain metastasis 07/02/2013    INTERIM HISTORY: has HYPERLIPIDEMIA; Tobacco abuse, in remission; HYPERTENSION; COPD; SPLENOMEGALY;  SMALL BOWEL OBSTRUCTION, HX OF; Rectal cancer; CAD (coronary artery disease) of artery bypass graft; and Brain metastasis on his problem list.   Rectal cancer    05/09/2006  Surgery  Rectum, abdomino-perineal resection demonstrating invasive adenocarcinoma of rectum, moderately differentiated, with invasion into the perirectal adipose tissue with positive margin in the perirectal soft tissue. 1/12 lymph nodes positive for disease    06/14/2006 - 09/28/2006  Chemotherapy  Oxaliplatin, Xeloda, and Avastin x 6 cycles    10/12/2006 - 10/26/2006  Chemotherapy  Avastin    06/10/2009  Cancer Staging  Lung biopsy positive for adenocarcinoma of colorectal origin    06/30/2009 - 01/06/2010  Chemotherapy  FOLFOX + Avastin x 13 cycles    08/10/2010 - 12/09/2010  Chemotherapy  FOLFIRI + Avastin x 8 cycles    05/04/2011 - 10/12/2011  Chemotherapy  Cetuximab + 5FU/Leucovorin x 12 cycles    01/24/2012 - 07/25/2012  Chemotherapy  Cetuximab + 5FU/Leucovorin x 13 cycles    10/31/2012  Adverse Reaction  Anaphylaxis to Cetuximab    10/31/2012 - 10/31/2012  Chemotherapy  Cetuximab    11/14/2012 - 12/12/2012  Chemotherapy  5FU/Leucovorin + Avastin    07/02/2013  Imaging  CT CAP and head- progression of disease with brain met. See brain met oncology history for details.    07/03/2013 -  Chemotherapy  FOLFIRI + Avastin     ALLERGIES:  is allergic to erbitux.  MEDICATIONS: has a current medication list which includes the following prescription(s): albuterol, aspirin, dexamethasone, first-dukes mouthwash, fluconazole, lorazepam, multivitamin, pantoprazole, prochlorperazine, amlodipine, aspirin-salicylamide-caffeine, diphenoxylate-atropine, lidocaine, lisinopril, loperamide, metoclopramide, metoprolol, morphine, oxycodone, potassium chloride sa, regorafenib, and triamterene-hydrochlorothiazide, and the following Facility-Administered Medications: dextrose  5 % and 0.9% nacl, sodium chloride, and sucralfate.  SURGICAL HISTORY:  Past Surgical  History  Procedure Laterality Date  . Irrigation and debridement sebaceous cyst    . Laparoscopic lysis intestinal adhesions  2009    With incidental appendectomy  . Coronary artery bypass graft  2011    2 vessel  . Abdominoperineal proctocolectomy  2008  . Portacath placement  2011  . Colostomy      FAMILY HISTORY: family history includes Cancer in his maternal grandmother and mother.  SOCIAL HISTORY:  reports that he quit smoking about 3 years ago. He has never used smokeless tobacco. He reports that he does not drink alcohol or use illicit drugs.  REVIEW OF SYSTEMS:  Other than that discussed above is noncontributory.  PHYSICAL EXAMINATION: ECOG PERFORMANCE STATUS: 1 - Symptomatic but completely ambulatory  Blood pressure 111/77, pulse 88, temperature 98.2 F (36.8 C), resp. rate 16, weight 154 lb 4.8 oz (69.99 kg).  GENERAL:alert, no distress and comfortable SKIN: skin color, texture, turgor are normal, no rashes or significant lesions EYES: PERLA; Conjunctiva are pink and non-injected, sclera clear SINUSES: No redness or tenderness over maxillary or ethmoid sinuses OROPHARYNX:no exudate, no erythema on lips, buccal mucosa, or tongue. NECK: supple, thyroid normal size, non-tender, without nodularity. No masses CHEST: Increased AP diameter with light port in place. No breast masses. LYMPH:  no palpable lymphadenopathy in the cervical, axillary or inguinal LUNGS: clear to auscultation and percussion with normal breathing effort HEART: regular rate & rhythm and no murmurs. ABDOMEN:abdomen soft, non-tender and normal bowel sounds. No distention. Liver and spleen not enlarged. MUSCULOSKELETAL:no cyanosis of digits and no clubbing. Range of motion normal.  NEURO: alert & oriented x 3 with fluent speech, no focal motor/sensory deficits   LABORATORY DATA: Appointment on 08/09/2013  Component Date Value Ref Range Status  . WBC 08/09/2013 5.3  4.0 - 10.5 K/uL Final  . RBC  08/09/2013 4.29  4.22 - 5.81 MIL/uL Final  . Hemoglobin 08/09/2013 13.1  13.0 - 17.0 g/dL Final  . HCT 08/09/2013 38.4* 39.0 - 52.0 % Final  . MCV 08/09/2013 89.5  78.0 - 100.0 fL Final  . MCH 08/09/2013 30.5  26.0 - 34.0 pg Final  . MCHC 08/09/2013 34.1  30.0 - 36.0 g/dL Final  . RDW 08/09/2013 17.2* 11.5 - 15.5 % Final  . Platelets 08/09/2013 146* 150 - 400 K/uL Final  . Neutrophils Relative % 08/09/2013 75  43 - 77 % Final  . Neutro Abs 08/09/2013 4.0  1.7 - 7.7 K/uL Final  . Lymphocytes Relative 08/09/2013 12  12 - 46 % Final  . Lymphs Abs 08/09/2013 0.6* 0.7 - 4.0 K/uL Final  . Monocytes Relative 08/09/2013 13* 3 - 12 % Final  . Monocytes Absolute 08/09/2013 0.7  0.1 - 1.0 K/uL Final  . Eosinophils Relative 08/09/2013 0  0 - 5 % Final  . Eosinophils Absolute 08/09/2013 0.0  0.0 - 0.7 K/uL Final  . Basophils Relative 08/09/2013 0  0 - 1 % Final  . Basophils Absolute 08/09/2013 0.0  0.0 - 0.1 K/uL Final  . Sodium 08/09/2013 136* 137 - 147 mEq/L Final  . Potassium 08/09/2013 4.7  3.7 - 5.3 mEq/L Final  . Chloride 08/09/2013 97  96 - 112 mEq/L Final  . CO2 08/09/2013 25  19 - 32 mEq/L Final  . Glucose, Bld 08/09/2013 94  70 - 99 mg/dL Final  . BUN 08/09/2013 19  6 - 23 mg/dL Final  .  Creatinine, Ser 08/09/2013 0.99  0.50 - 1.35 mg/dL Final  . Calcium 08/09/2013 9.0  8.4 - 10.5 mg/dL Final  . Total Protein 08/09/2013 6.7  6.0 - 8.3 g/dL Final  . Albumin 08/09/2013 3.3* 3.5 - 5.2 g/dL Final  . AST 08/09/2013 16  0 - 37 U/L Final  . ALT 08/09/2013 18  0 - 53 U/L Final  . Alkaline Phosphatase 08/09/2013 92  39 - 117 U/L Final  . Total Bilirubin 08/09/2013 0.4  0.3 - 1.2 mg/dL Final  . GFR calc non Af Amer 08/09/2013 90* >90 mL/min Final  . GFR calc Af Amer 08/09/2013 >90  >90 mL/min Final   Comment: (NOTE)                          The eGFR has been calculated using the CKD EPI equation.                          This calculation has not been validated in all clinical situations.                           eGFR's persistently <90 mL/min signify possible Chronic Kidney                          Disease.  Infusion on 08/06/2013  Component Date Value Ref Range Status  . WBC 08/06/2013 6.1  4.0 - 10.5 K/uL Final  . RBC 08/06/2013 4.34  4.22 - 5.81 MIL/uL Final  . Hemoglobin 08/06/2013 13.1  13.0 - 17.0 g/dL Final  . HCT 08/06/2013 38.9* 39.0 - 52.0 % Final  . MCV 08/06/2013 89.6  78.0 - 100.0 fL Final  . MCH 08/06/2013 30.2  26.0 - 34.0 pg Final  . MCHC 08/06/2013 33.7  30.0 - 36.0 g/dL Final  . RDW 08/06/2013 16.7* 11.5 - 15.5 % Final  . Platelets 08/06/2013 165  150 - 400 K/uL Final  . Neutrophils Relative % 08/06/2013 88* 43 - 77 % Final  . Neutro Abs 08/06/2013 5.4  1.7 - 7.7 K/uL Final  . Lymphocytes Relative 08/06/2013 7* 12 - 46 % Final  . Lymphs Abs 08/06/2013 0.5* 0.7 - 4.0 K/uL Final  . Monocytes Relative 08/06/2013 5  3 - 12 % Final  . Monocytes Absolute 08/06/2013 0.3  0.1 - 1.0 K/uL Final  . Eosinophils Relative 08/06/2013 0  0 - 5 % Final  . Eosinophils Absolute 08/06/2013 0.0  0.0 - 0.7 K/uL Final  . Basophils Relative 08/06/2013 0  0 - 1 % Final  . Basophils Absolute 08/06/2013 0.0  0.0 - 0.1 K/uL Final  . Sodium 08/06/2013 134* 137 - 147 mEq/L Final  . Potassium 08/06/2013 4.6  3.7 - 5.3 mEq/L Final  . Chloride 08/06/2013 99  96 - 112 mEq/L Final  . CO2 08/06/2013 21  19 - 32 mEq/L Final  . Glucose, Bld 08/06/2013 218* 70 - 99 mg/dL Final  . BUN 08/06/2013 34* 6 - 23 mg/dL Final  . Creatinine, Ser 08/06/2013 1.25  0.50 - 1.35 mg/dL Final  . Calcium 08/06/2013 8.8  8.4 - 10.5 mg/dL Final  . Total Protein 08/06/2013 6.6  6.0 - 8.3 g/dL Final  . Albumin 08/06/2013 3.2* 3.5 - 5.2 g/dL Final  . AST 08/06/2013 18  0 - 37 U/L Final  . ALT 08/06/2013 19  0 -  53 U/L Final  . Alkaline Phosphatase 08/06/2013 88  39 - 117 U/L Final  . Total Bilirubin 08/06/2013 0.8  0.3 - 1.2 mg/dL Final  . GFR calc non Af Amer 08/06/2013 63* >90 mL/min Final  . GFR calc Af  Amer 08/06/2013 73* >90 mL/min Final   Comment: (NOTE)                          The eGFR has been calculated using the CKD EPI equation.                          This calculation has not been validated in all clinical situations.                          eGFR's persistently <90 mL/min signify possible Chronic Kidney                          Disease.  . Magnesium 08/06/2013 2.4  1.5 - 2.5 mg/dL Final  Appointment on 08/02/2013  Component Date Value Ref Range Status  . WBC 08/02/2013 12.1* 4.0 - 10.5 K/uL Final  . RBC 08/02/2013 4.64  4.22 - 5.81 MIL/uL Final  . Hemoglobin 08/02/2013 13.9  13.0 - 17.0 g/dL Final  . HCT 08/02/2013 41.3  39.0 - 52.0 % Final  . MCV 08/02/2013 89.0  78.0 - 100.0 fL Final  . MCH 08/02/2013 30.0  26.0 - 34.0 pg Final  . MCHC 08/02/2013 33.7  30.0 - 36.0 g/dL Final  . RDW 08/02/2013 15.8* 11.5 - 15.5 % Final  . Platelets 08/02/2013 248  150 - 400 K/uL Final  . Neutrophils Relative % 08/02/2013 88* 43 - 77 % Final  . Neutro Abs 08/02/2013 10.6* 1.7 - 7.7 K/uL Final  . Lymphocytes Relative 08/02/2013 7* 12 - 46 % Final  . Lymphs Abs 08/02/2013 0.8  0.7 - 4.0 K/uL Final  . Monocytes Relative 08/02/2013 5  3 - 12 % Final  . Monocytes Absolute 08/02/2013 0.6  0.1 - 1.0 K/uL Final  . Eosinophils Relative 08/02/2013 0  0 - 5 % Final  . Eosinophils Absolute 08/02/2013 0.0  0.0 - 0.7 K/uL Final  . Basophils Relative 08/02/2013 0  0 - 1 % Final  . Basophils Absolute 08/02/2013 0.0  0.0 - 0.1 K/uL Final  . Sodium 08/02/2013 140  137 - 147 mEq/L Final  . Potassium 08/02/2013 4.7  3.7 - 5.3 mEq/L Final  . Chloride 08/02/2013 100  96 - 112 mEq/L Final  . CO2 08/02/2013 24  19 - 32 mEq/L Final  . Glucose, Bld 08/02/2013 122* 70 - 99 mg/dL Final  . BUN 08/02/2013 37* 6 - 23 mg/dL Final  . Creatinine, Ser 08/02/2013 1.38* 0.50 - 1.35 mg/dL Final  . Calcium 08/02/2013 9.4  8.4 - 10.5 mg/dL Final  . Total Protein 08/02/2013 7.1  6.0 - 8.3 g/dL Final  . Albumin 08/02/2013  3.7  3.5 - 5.2 g/dL Final  . AST 08/02/2013 16  0 - 37 U/L Final  . ALT 08/02/2013 18  0 - 53 U/L Final  . Alkaline Phosphatase 08/02/2013 89  39 - 117 U/L Final  . Total Bilirubin 08/02/2013 0.5  0.3 - 1.2 mg/dL Final  . GFR calc non Af Amer 08/02/2013 56* >90 mL/min Final  . GFR calc Af Amer 08/02/2013 65* >90 mL/min  Final   Comment: (NOTE)                          The eGFR has been calculated using the CKD EPI equation.                          This calculation has not been validated in all clinical situations.                          eGFR's persistently <90 mL/min signify possible Chronic Kidney                          Disease.  Infusion on 07/24/2013  Component Date Value Ref Range Status  . WBC 07/24/2013 11.6* 4.0 - 10.5 K/uL Final  . RBC 07/24/2013 4.13* 4.22 - 5.81 MIL/uL Final  . Hemoglobin 07/24/2013 12.6* 13.0 - 17.0 g/dL Final  . HCT 07/24/2013 36.5* 39.0 - 52.0 % Final  . MCV 07/24/2013 88.4  78.0 - 100.0 fL Final  . MCH 07/24/2013 30.5  26.0 - 34.0 pg Final  . MCHC 07/24/2013 34.5  30.0 - 36.0 g/dL Final  . RDW 07/24/2013 15.3  11.5 - 15.5 % Final  . Platelets 07/24/2013 252  150 - 400 K/uL Final  . Neutrophils Relative % 07/24/2013 86* 43 - 77 % Final  . Neutro Abs 07/24/2013 9.9* 1.7 - 7.7 K/uL Final  . Lymphocytes Relative 07/24/2013 8* 12 - 46 % Final  . Lymphs Abs 07/24/2013 1.0  0.7 - 4.0 K/uL Final  . Monocytes Relative 07/24/2013 6  3 - 12 % Final  . Monocytes Absolute 07/24/2013 0.7  0.1 - 1.0 K/uL Final  . Eosinophils Relative 07/24/2013 0  0 - 5 % Final  . Eosinophils Absolute 07/24/2013 0.0  0.0 - 0.7 K/uL Final  . Basophils Relative 07/24/2013 0  0 - 1 % Final  . Basophils Absolute 07/24/2013 0.0  0.0 - 0.1 K/uL Final  . Sodium 07/24/2013 132* 137 - 147 mEq/L Final  . Potassium 07/24/2013 4.5  3.7 - 5.3 mEq/L Final  . Chloride 07/24/2013 96  96 - 112 mEq/L Final  . CO2 07/24/2013 24  19 - 32 mEq/L Final  . Glucose, Bld 07/24/2013 100* 70 - 99 mg/dL  Final  . BUN 07/24/2013 30* 6 - 23 mg/dL Final  . Creatinine, Ser 07/24/2013 1.34  0.50 - 1.35 mg/dL Final  . Calcium 07/24/2013 8.8  8.4 - 10.5 mg/dL Final  . Total Protein 07/24/2013 6.3  6.0 - 8.3 g/dL Final  . Albumin 07/24/2013 3.1* 3.5 - 5.2 g/dL Final  . AST 07/24/2013 15  0 - 37 U/L Final  . ALT 07/24/2013 15  0 - 53 U/L Final  . Alkaline Phosphatase 07/24/2013 92  39 - 117 U/L Final  . Total Bilirubin 07/24/2013 0.2* 0.3 - 1.2 mg/dL Final  . GFR calc non Af Amer 07/24/2013 58* >90 mL/min Final  . GFR calc Af Amer 07/24/2013 67* >90 mL/min Final   Comment: (NOTE)                          The eGFR has been calculated using the CKD EPI equation.  This calculation has not been validated in all clinical situations.                          eGFR's persistently <90 mL/min signify possible Chronic Kidney                          Disease.  . CEA 07/24/2013 41.7* 0.0 - 5.0 ng/mL Final   Performed at [facility]  . Specific Gravity, Urine 07/24/2013 >1.030* 1.005 - 1.030 Final  . pH 07/24/2013 5.5  5.0 - 8.0 Final  . Glucose, UA 07/24/2013 NEGATIVE  NEGATIVE mg/dL Final  . Hgb urine dipstick 07/24/2013 NEGATIVE  NEGATIVE Final  . Bilirubin Urine 07/24/2013 NEGATIVE  NEGATIVE Final  . Ketones, ur 07/24/2013 NEGATIVE  NEGATIVE mg/dL Final  . Protein, ur 07/24/2013 TRACE* NEGATIVE mg/dL Final  . Urobilinogen, UA 07/24/2013 0.2  0.0 - 1.0 mg/dL Final  . Nitrite 07/24/2013 NEGATIVE  NEGATIVE Final  . Leukocytes, UA 07/24/2013 NEGATIVE  NEGATIVE Final  Infusion on 07/17/2013  Component Date Value Ref Range Status  . WBC 07/17/2013 5.1  4.0 - 10.5 K/uL Final  . RBC 07/17/2013 4.38  4.22 - 5.81 MIL/uL Final  . Hemoglobin 07/17/2013 13.2  13.0 - 17.0 g/dL Final  . HCT 07/17/2013 38.0* 39.0 - 52.0 % Final  . MCV 07/17/2013 86.8  78.0 - 100.0 fL Final  . MCH 07/17/2013 30.1  26.0 - 34.0 pg Final  . MCHC 07/17/2013 34.7  30.0 - 36.0 g/dL Final  . RDW  07/17/2013 14.0  11.5 - 15.5 % Final  . Platelets 07/17/2013 280  150 - 400 K/uL Final  . Neutrophils Relative % 07/17/2013 62  43 - 77 % Final  . Neutro Abs 07/17/2013 3.2  1.7 - 7.7 K/uL Final  . Lymphocytes Relative 07/17/2013 20  12 - 46 % Final  . Lymphs Abs 07/17/2013 1.0  0.7 - 4.0 K/uL Final  . Monocytes Relative 07/17/2013 15* 3 - 12 % Final  . Monocytes Absolute 07/17/2013 0.8  0.1 - 1.0 K/uL Final  . Eosinophils Relative 07/17/2013 2  0 - 5 % Final  . Eosinophils Absolute 07/17/2013 0.1  0.0 - 0.7 K/uL Final  . Basophils Relative 07/17/2013 1  0 - 1 % Final  . Basophils Absolute 07/17/2013 0.1  0.0 - 0.1 K/uL Final  . Sodium 07/17/2013 129* 137 - 147 mEq/L Final  . Potassium 07/17/2013 3.0* 3.7 - 5.3 mEq/L Final  . Chloride 07/17/2013 87* 96 - 112 mEq/L Final  . CO2 07/17/2013 26  19 - 32 mEq/L Final  . Glucose, Bld 07/17/2013 118* 70 - 99 mg/dL Final  . BUN 07/17/2013 11  6 - 23 mg/dL Final  . Creatinine, Ser 07/17/2013 1.22  0.50 - 1.35 mg/dL Final  . Calcium 07/17/2013 8.9  8.4 - 10.5 mg/dL Final  . Total Protein 07/17/2013 6.5  6.0 - 8.3 g/dL Final  . Albumin 07/17/2013 2.9* 3.5 - 5.2 g/dL Final  . AST 07/17/2013 13  0 - 37 U/L Final  . ALT 07/17/2013 10  0 - 53 U/L Final  . Alkaline Phosphatase 07/17/2013 95  39 - 117 U/L Final  . Total Bilirubin 07/17/2013 0.4  0.3 - 1.2 mg/dL Final  . GFR calc non Af Amer 07/17/2013 65* >90 mL/min Final  . GFR calc Af Amer 07/17/2013 75* >90 mL/min Final   Comment: (NOTE)  The eGFR has been calculated using the CKD EPI equation.                          This calculation has not been validated in all clinical situations.                          eGFR's persistently <90 mL/min signify possible Chronic Kidney                          Disease.  . CEA 07/17/2013 33.9* 0.0 - 5.0 ng/mL Final   Performed at [facility]  . Specific Gravity, Urine 07/17/2013 1.020  1.005 - 1.030 Final  . pH 07/17/2013 6.0   5.0 - 8.0 Final  . Glucose, UA 07/17/2013 NEGATIVE  NEGATIVE mg/dL Final  . Hgb urine dipstick 07/17/2013 NEGATIVE  NEGATIVE Final  . Bilirubin Urine 07/17/2013 NEGATIVE  NEGATIVE Final  . Ketones, ur 07/17/2013 NEGATIVE  NEGATIVE mg/dL Final  . Protein, ur 07/17/2013 30* NEGATIVE mg/dL Final  . Urobilinogen, UA 07/17/2013 0.2  0.0 - 1.0 mg/dL Final  . Nitrite 07/17/2013 NEGATIVE  NEGATIVE Final  . Leukocytes, UA 07/17/2013 NEGATIVE  NEGATIVE Final  . Miscellaneous Test 07/17/2013 TEST [id number]   Final  . Miscellaneous Test Results 07/17/2013 SEE BELOW   Final   Comment: (NOTE)                          TEST                          ======================================================================                          UDP Glucuronosyltransferase 1A1 (UGT1A1) Genotyping                          [facility] test code [id number]                          UGT1A1 Genotyping Specimen Whole Blood                          UGT1A1 Genotyping [place] 1 (TA)6 or *1                          UGT1A1 Genotyping [place] 2 (TA)6 or *1                          UGT1A1 Genotyping Interpretation         SEE NOTE                          --------------------------------         --------                          Indications for ordering:                          - Determine sensitivity to irinotecan or related compounds.                          -  Confirm a diagnosis of Gilbert Syndrome.                          Homozygous UGT1A1 (TA)6: Two copies of the UGT1A1 *1 (TA)6 were                          detected. This is associated with normal UGT1A1 enzyme levels.                          This genotype has not been associated with Gilberts syndrome                          (benign familial hyperbilirubinemia).                          This result has been reviewed and approved by Anabel Halon, M.D., Ph.D.                          BACKGROUND INFORMATION: UDP Glucuronosyltransferase 1A1  (UGT1A1)                          Genotyping                          CHARACTERISTICS: UGT1A1 is responsible for the clearance of                          drugs (e.g., irinotecan) and endobiotic compounds (e.g.,                          bilirubin). Irinotecan's major active and toxic metabolite                          (SN-38) is inactivated by the UGT1A1 enzyme and then eliminated                          via the bile. UGT1A1 gene mutations cause accumulation of SN-38,                          which may lead to irinotecan-related toxicities (neutropenia,                          diarrhea).                          CAUSE: Variations in TA repeat number in the TATAAA element of                          the 5'UGT1A1-promoter affects transcription efficiency. The                          common number of repeats is six [(TA)6, *1 allele], while seven  repeats [(TA)7, *28 allele] is associated with reduced                          transcription activity. Homozygosity for the (TA)7 allele is                          also associated with Rosanna Obie Syndrome (benign familial                          hyperbilirubinemia).                          ALLELES TESTED: *36 allele, (TA)5; *1 allele, (TA)6; *28                          alelle, (TA)7 and *37 allele, (TA)8.                          CLINICAL SENSTIVITY/SPECIFICITY: Risk of irinotecan toxicity by                          genotype (Br J Cancer (2004) 17:616-07).                          6/6 (*1/*1): diarrhea 17 percent; neutropenia 15 percent                          6/7 (*1/*28): diarrhea 33 percent; neutropenia 27 percent                          7/7 (*28/*28): diarrhea 70 percent; neutropenia 40 percent                          ALLELIC FREQUENCY:                          *1(TA)6: Caucasians 0.61, Asians 0.84, African Americans 0.47                          *28(TA)7: Caucasians 0.39, Asians 0.16, African Americans 0.43                           METHODOLOGY: Polymerase chain reaction followed by size analysis                          using capillary electrophoresis.                          ANALYTICAL SENSTIVITY: Greater than 99 percent.                          LIMITATIONS: Variations in the UGT1A1 gene, other than those                          targeted, will not be detected. Clinical significance of the  rare *36, (TA)5 and *37, (TA)8 alleles in predicting irinotecan                          toxicities is not well established. Genetic and non-genetic                          factors other than UGT1A1, may contribute to irinotecan toxicity                          and efficacy. Diagnostic errors can occur due to rare sequence                          variations.                          Test developed and characteristics determined by [facility]. See Compliance Statement C: [email or web address]                          Performed at:    [place] [address]                          Performed at [place]: No new pathology. Tumor is K-ras wild-type  Urinalysis    Component Value Date/Time   COLORURINE YELLOW 03/02/2013 1400   APPEARANCEUR CLEAR 03/02/2013 1400   LABSPEC >1.030* 07/24/2013 0826   PHURINE 5.5 07/24/2013 0826   GLUCOSEU NEGATIVE 07/24/2013 0826   HGBUR NEGATIVE 07/24/2013 0826   BILIRUBINUR NEGATIVE 07/24/2013 0826   KETONESUR NEGATIVE 07/24/2013 0826   PROTEINUR TRACE* 07/24/2013 0826   UROBILINOGEN 0.2 07/24/2013 0826   NITRITE NEGATIVE 07/24/2013 0826   LEUKOCYTESUR NEGATIVE 07/24/2013 0826    RADIOGRAPHIC STUDIES: Mr Jeri Cos HG Contrast  08/16/2013   ADDENDUM REPORT: 08/16/2013 08:57  ADDENDUM: Patient's clinical data should read:  Rectal carcinoma.   Electronically Signed   By: Chauncey Cruel M.D.   On: 08/16/13 08:57     08-16-13   CLINICAL DATA:  Colon and renal cancer with metastatic disease to lung and brain lateral right cerebellar. Stereotactic radiation surgery planning exam.  EXAM: MRI HEAD WITHOUT AND WITH CONTRAST  TECHNIQUE: Multiplanar, multiecho pulse sequences of the brain and surrounding structures were obtained without and with intravenous contrast.  CONTRAST:  47mL MULTIHANCE GADOBENATE DIMEGLUMINE 529 MG/ML IV SOLN  COMPARISON:  07/02/2013 head CT.  No comparison brain MR.  FINDINGS: 1.2 x 1 x 0.9 cm enhancing lesion lateral aspect of the right cerebellum may contain blood breakdown products and represent hemorrhagic metastatic lesion from renal cell cancer. Minimal surrounding vasogenic edema.  No  other parenchymal enhancing lesions. There are areas of artifact (most notable left internal auditory canal region and frontal lobe) but without other intracranial mass identified.  No acute infarct.  No intracranial hemorrhage separate from the right cerebellar lesion.  Mild small vessel disease type changes.  Atherosclerotic type changes right vertebral artery with narrowing. Remainder of major intracranial vascular structures are patent.  IMPRESSION: 1.2 x 1 x 0.9 cm hemorrhagic enhancing lesion lateral aspect of the right cerebellum may represent metastatic lesion from renal cell cancer. Minimal surrounding vasogenic edema.  No other parenchymal enhancing lesions.  No acute infarct.  Mild small vessel disease type changes.  Atherosclerotic type changes right vertebral artery with narrowing.  Electronically Signed: By: Chauncey Cruel M.D. On: 07/16/2013 12:18    ASSESSMENT:  #1. Stage IV rectal cancer with liver, abdominal lymph node, lung, and brain metastases, status post SBRT to a singular cerebellar lesion receiving 20 gray on 07/23/2013. #2. Severe grade 4 esophagitis, vastly improved, gaining 6 pounds in the last 3 days. #3.Chronic obstructive pulmonary disease.  #4. Cholelithiasis,  asymptomatic    PLAN:  #1. Continue dexamethasone 4 mg daily. #2. Office visit 2 weeks with CBC and chem profile. Patient was told not to take any Stivarga should he receive it at home prior to his next visit but the premedication in with him at his next visit.   All questions were answered. The patient knows to call the clinic with any problems, questions or concerns. We can certainly see the patient much sooner if necessary.   I spent 25 minutes counseling the patient face to face. The total time spent in the appointment was 30 minutes.    Farrel Gobble, MD 08/09/2013 2:17 PM  DISCLAIMER:  This note was dictated with voice recognition software.  Similar sounding words can inadvertently be transcribed inaccurately and may not be corrected upon review.

## 2013-08-10 ENCOUNTER — Other Ambulatory Visit (HOSPITAL_COMMUNITY): Payer: Self-pay | Admitting: Hematology and Oncology

## 2013-08-10 MED ORDER — REGORAFENIB 40 MG PO TABS: ORAL_TABLET | ORAL | Status: DC

## 2013-08-17 ENCOUNTER — Encounter: Payer: Self-pay | Admitting: *Deleted

## 2013-08-17 NOTE — Progress Notes (Signed)
This encounter was created in error - please disregard.

## 2013-08-17 NOTE — Progress Notes (Signed)
Labs drawn

## 2013-08-20 NOTE — Progress Notes (Signed)
[facility], [place] [address]  Rectal cancer - Plan: regorafenib (STIVARGA) 40 MG tablet  Brain metastasis - Plan: regorafenib (STIVARGA) 40 MG tablet  CURRENT THERAPY: Stivarga 80 mg daily x 21 days and then a 7 day respite.  INTERVAL HISTORY: Martin Maldonado 58 y.o. male returns for  regular  visit for followup of Stage IV rectal cancer with liver, abdominal lymph node, lung, and brain metastases, status post SBRT to a singular cerebellar lesion receiving 20 gray on 07/23/2013.  S/P 1 cycle of FOLFIRI +Avastin with intolerance secondary to severe diarrhea requiring discontinuation.  Leucovorin/5-FU + Avastin x 1 cycles resulting in grade 4 esophagitis requiring discontinuation of all 5-FU based chemotherapies.  To start [place] when symptoms resolve in the near future.    Rectal cancer   05/09/2006 Surgery Rectum, abdomino-perineal resection demonstrating invasive adenocarcinoma of rectum, moderately differentiated, with invasion into the perirectal adipose tissue with positive margin in the perirectal soft tissue.  1/12 lymph nodes positive for disease   06/14/2006 - 09/28/2006 Chemotherapy Oxaliplatin, Xeloda, and Avastin x 6 cycles   10/12/2006 - 10/26/2006 Chemotherapy Avastin    06/10/2009 Cancer Staging Lung biopsy positive for adenocarcinoma of colorectal origin   06/30/2009 - 01/06/2010 Chemotherapy FOLFOX + Avastin x 13 cycles   08/10/2010 - 12/09/2010 Chemotherapy FOLFIRI + Avastin x 8 cycles   05/04/2011 - 10/12/2011 Chemotherapy Cetuximab + 5FU/Leucovorin x 12 cycles   01/24/2012 - 07/25/2012 Chemotherapy Cetuximab + 5FU/Leucovorin x 13 cycles   10/31/2012 Adverse Reaction Anaphylaxis to Cetuximab   10/31/2012 - 10/31/2012 Chemotherapy Cetuximab   11/14/2012 - 12/12/2012 Chemotherapy 5FU/Leucovorin + Avastin   07/02/2013 Imaging CT CAP and head- progression of disease with brain met.  See brain met oncology history for details.   07/03/2013 - 08/06/2013 Chemotherapy FOLFIRI +  Avastin.  Discontinued following cycle 1 due to severe diarrhea   07/23/2013 - 07/23/2013 Radiation Therapy Stereotactic 20Gy to right cerebellar tumor, Dr. Isidore Moos   08/07/2013 -  Chemotherapy Leucovorin/5-FU plus Avastin x1 cycle with severe grade 4 esophagitis causing discontinuation    Chemotherapy Stivarga 80 mg 21 days on and 7 day respite    Brain metastasis   07/02/2013 Initial Diagnosis CT head- Mass with surrounding vasogenic edema measuring 1.5 x 1.5 cm in the lateral right cerebellum. This mass shows irregular rim enhancement.    07/19/2013 Imaging MRI brain- 1.2 x 1 x 0.9 cm hemorrhagic enhancing lesion lateral aspect of the right cerebellum may represent metastatic lesion    07/23/2013 - 07/23/2013 Radiation Therapy SRS by Dr. Kaleen Maldonado is doing much better.  His QOL is significantly improved and his appetite is back.   He reports that his Stivarga medication will be delivered tomorrow and I have given him the liberty to start the medication when he desires within the next 3 weeks.  He will call us to let us know when he starts.  He will take the medication BID 3 weeks on and 1 week off.   He requests an excuse to not wear his seatbelt due to it interfering with his port-a-cath and colostomy bag.  This is reasonable and an excuse is written.  Additionally, he requests a handicap placard.  This was completed today.  He notes a right UE edema which is seen on exam.  It is not erythematous, warm or tender to deep palpation.  On exam, he has a healing right olecranon lesion from when he was laying in bed most  of the day in the past.  This is healing nicely.  He reports that it is getting much better (the edema).  He declined an [place] today to rule out DVT.  Oncologically, he denies any complaints and ROS questioning is negative.    Past Medical History  Diagnosis Date  . Hypertension   . Rectal cancer 2008    Adenocarcinoma; AP resection in 2008; recurrence with lung metastases in 2011  .  Arteriosclerotic cardiovascular disease (ASCVD) 2011    CABG surgery in 2011  . Hyperlipidemia   . 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 Splenomegaly 01/23/2009    Additional GI history includes small bowel obstruction, requiring laparotomy and lysis of adhesions in 2009, remote peptic ulcer disease and postoperative perirectal infection  . ALCOHOL ABUSE, HX OF 01/23/2009  . Brain metastasis 07/02/2013  . On antineoplastic chemotherapy     Leucovorin/5-FU plus Avastin  . S/P radiation therapy  07/23/2013    Right cerebellar    has HYPERLIPIDEMIA; Tobacco abuse, in remission; HYPERTENSION; COPD; SPLENOMEGALY; SMALL BOWEL OBSTRUCTION, HX OF; Rectal cancer; CAD (coronary artery disease) of artery bypass graft; and Brain metastasis on his problem list.     is allergic to erbitux.  Mr. [name] had no medications administered during this visit.  Past Surgical History  Procedure Laterality Date  . Irrigation and debridement sebaceous cyst    . Laparoscopic lysis intestinal adhesions  2009    With incidental appendectomy  . Coronary artery bypass graft  2011    2 vessel  . Abdominoperineal proctocolectomy  2008  . Portacath placement  2011  . Colostomy      Denies any headaches, dizziness, double vision, fevers, chills, night sweats, nausea, vomiting, diarrhea, constipation, chest pain, heart palpitations, shortness of breath, blood in stool, black tarry stool, urinary pain, urinary burning, urinary frequency, hematuria.   PHYSICAL EXAMINATION  ECOG PERFORMANCE STATUS: 1 - Symptomatic but completely ambulatory  Filed Vitals:   08/23/13 1200  BP: 148/92  Pulse: 93  Temp: 97.6 F (36.4 C)  Resp: 18    GENERAL:alert, no distress, comfortable, cooperative, smiling and hoarse voice SKIN: skin color, texture, turgor are normal, no rashes or significant lesions HEAD: Normocephalic, No masses, lesions, tenderness or abnormalities EYES:  normal, PERRLA, EOMI, Conjunctiva are pink and non-injected EARS: External ears normal OROPHARYNX:mucous membranes are moist  NECK: supple, trachea midline LYMPH:  not examined BREAST:not examined LUNGS: clear to auscultation and percussion HEART: regular rate & rhythm, no murmurs and no gallops ABDOMEN:abdomen soft, normal bowel sounds and colostomy producing stool appropriately. BACK: Back symmetric, no curvature. EXTREMITIES:less then 2 second capillary refill, no joint deformities, effusion, or inflammation, no skin discoloration, no clubbing, no cyanosis, positive findings:  edema right UE edema without increase warmth, erythema, or pain.  NEURO: alert & oriented x 3 with fluent speech, no focal motor/sensory deficits, gait normal    LABORATORY DATA: CBC    Component Value Date/Time   WBC 11.6* 08/23/2013 1200   RBC 4.48 08/23/2013 1200   HGB 13.9 08/23/2013 1200   HCT 41.5 08/23/2013 1200   PLT 169 08/23/2013 1200   MCV 92.6 08/23/2013 1200   MCH 31.0 08/23/2013 1200   MCHC 33.5 08/23/2013 1200   RDW 19.0* 08/23/2013 1200   LYMPHSABS 0.8 08/23/2013 1200   MONOABS 0.6 08/23/2013 1200   EOSABS 0.0 08/23/2013 1200   BASOSABS 0.0 08/23/2013 1200      Chemistry  Component Value Date/Time   NA 138 08/23/2013 1200   K 3.5* 08/23/2013 1200   CL 96 08/23/2013 1200   CO2 24 08/23/2013 1200   BUN 28* 08/23/2013 1200   CREATININE 0.88 08/23/2013 1200      Component Value Date/Time   CALCIUM 8.9 08/23/2013 1200   ALKPHOS 98 08/23/2013 1200   AST 26 08/23/2013 1200   ALT 27 08/23/2013 1200   BILITOT 0.4 08/23/2013 1200        ASSESSMENT:  1. Stage IV rectal cancer with liver, abdominal lymph node, lung, and brain metastases, status post SBRT to a singular cerebellar lesion receiving 20 gray on 07/23/2013.  S/P 1 cycle of FOLFIRI +Avastin with intolerance secondary to severe diarrhea requiring discontinuation.  Leucovorin/5-FU + Avastin x 1 cycles resulting in grade 4 esophagitis requiring discontinuation of all  5-FU based chemotherapies.  To start [place] when symptoms resolve. 2. Intolerance to Irinotecan causing severe diarrhea 3. Intolerance to 5-FU causing grade 4 esophagitis 4. Right UE edema, improving spontaneously 5. Healing right olecranon wound  Patient Active Problem List   Diagnosis Date Noted  . Brain metastasis 07/02/2013  . CAD (coronary artery disease) of artery bypass graft 02/12/2013  . Rectal cancer 09/22/2010  . HYPERLIPIDEMIA 11/06/2009  . Tobacco abuse, in remission 09/02/2009  . COPD 01/23/2009  . SPLENOMEGALY 01/23/2009  . SMALL BOWEL OBSTRUCTION, HX OF 01/23/2009  . HYPERTENSION 01/22/2009     PLAN:  1. I personally reviewed and went over laboratory results with the patient.  The results are noted within this dictation. 2. Start Stivarga within the next 3 weeks.  He will take 40 mg BID 3 weeks on and 1 week off.  He will call us and advise [place] when he starts the medication. 3. Follow-up with Dr. Isidore Moos ([place]) as scheduled 4. Excuse to not wear seat belt provided 5. Handicap placard form filled out for him. 20. Marylyn Ishihara will be delivered to his house tomorrow. 7. Return in 3 weeks for follow-up.   THERAPY PLAN:  He will begin Stivarga within the next three weeks (I am giving him the liberty to choose his own start date).  He will take 80 mg 3 weeks on and 1 week off.  We will plan on restaging him following 3 cycles of this therapy (sooner if clinically indicated).  I will defer brain management to Rad Onc.  All questions were answered. The patient knows to call the clinic with any problems, questions or concerns. We can certainly see the patient much sooner if necessary.  Patient and plan discussed with Dr. Farrel Gobble and he is in agreement with the aforementioned.   Baird Cancer 08/23/2013

## 2013-08-21 ENCOUNTER — Other Ambulatory Visit (HOSPITAL_COMMUNITY): Payer: Self-pay | Admitting: Hematology and Oncology

## 2013-08-21 MED ORDER — REGORAFENIB 40 MG PO TABS: ORAL_TABLET | ORAL | Status: DC

## 2013-08-23 ENCOUNTER — Encounter (HOSPITAL_BASED_OUTPATIENT_CLINIC_OR_DEPARTMENT_OTHER): Payer: Medicare Other

## 2013-08-23 ENCOUNTER — Encounter (HOSPITAL_COMMUNITY): Payer: Medicare Other | Attending: Oncology | Admitting: Oncology

## 2013-08-23 ENCOUNTER — Encounter (HOSPITAL_COMMUNITY): Payer: Self-pay | Admitting: Oncology

## 2013-08-23 ENCOUNTER — Other Ambulatory Visit (HOSPITAL_COMMUNITY): Payer: Self-pay | Admitting: Hematology and Oncology

## 2013-08-23 VITALS — BP 148/92 | HR 93 | Temp 97.6°F | Resp 18 | Wt 163.4 lb

## 2013-08-23 DIAGNOSIS — R609 Edema, unspecified: Secondary | ICD-10-CM

## 2013-08-23 DIAGNOSIS — C787 Secondary malignant neoplasm of liver and intrahepatic bile duct: Secondary | ICD-10-CM | POA: Insufficient documentation

## 2013-08-23 DIAGNOSIS — C20 Malignant neoplasm of rectum: Secondary | ICD-10-CM

## 2013-08-23 DIAGNOSIS — C7800 Secondary malignant neoplasm of unspecified lung: Secondary | ICD-10-CM | POA: Insufficient documentation

## 2013-08-23 DIAGNOSIS — C7949 Secondary malignant neoplasm of other parts of nervous system: Secondary | ICD-10-CM

## 2013-08-23 DIAGNOSIS — C7931 Secondary malignant neoplasm of brain: Secondary | ICD-10-CM | POA: Insufficient documentation

## 2013-08-23 DIAGNOSIS — C189 Malignant neoplasm of colon, unspecified: Secondary | ICD-10-CM

## 2013-08-23 DIAGNOSIS — C772 Secondary and unspecified malignant neoplasm of intra-abdominal lymph nodes: Secondary | ICD-10-CM

## 2013-08-23 LAB — COMPREHENSIVE METABOLIC PANEL
ALK PHOS: 98 U/L (ref 39–117)
ALT: 27 U/L (ref 0–53)
AST: 26 U/L (ref 0–37)
Albumin: 3.1 g/dL — ABNORMAL LOW (ref 3.5–5.2)
BILIRUBIN TOTAL: 0.4 mg/dL (ref 0.3–1.2)
BUN: 28 mg/dL — AB (ref 6–23)
CHLORIDE: 96 meq/L (ref 96–112)
CO2: 24 meq/L (ref 19–32)
Calcium: 8.9 mg/dL (ref 8.4–10.5)
Creatinine, Ser: 0.88 mg/dL (ref 0.50–1.35)
Glucose, Bld: 148 mg/dL — ABNORMAL HIGH (ref 70–99)
POTASSIUM: 3.5 meq/L — AB (ref 3.7–5.3)
Sodium: 138 mEq/L (ref 137–147)
Total Protein: 6.4 g/dL (ref 6.0–8.3)

## 2013-08-23 LAB — CBC WITH DIFFERENTIAL/PLATELET
Basophils Absolute: 0 10*3/uL (ref 0.0–0.1)
Basophils Relative: 0 % (ref 0–1)
EOS ABS: 0 10*3/uL (ref 0.0–0.7)
Eosinophils Relative: 0 % (ref 0–5)
HCT: 41.5 % (ref 39.0–52.0)
HEMOGLOBIN: 13.9 g/dL (ref 13.0–17.0)
Lymphocytes Relative: 7 % — ABNORMAL LOW (ref 12–46)
Lymphs Abs: 0.8 10*3/uL (ref 0.7–4.0)
MCH: 31 pg (ref 26.0–34.0)
MCHC: 33.5 g/dL (ref 30.0–36.0)
MCV: 92.6 fL (ref 78.0–100.0)
MONOS PCT: 5 % (ref 3–12)
Monocytes Absolute: 0.6 10*3/uL (ref 0.1–1.0)
NEUTROS ABS: 10.1 10*3/uL — AB (ref 1.7–7.7)
Neutrophils Relative %: 88 % — ABNORMAL HIGH (ref 43–77)
Platelets: 169 10*3/uL (ref 150–400)
RBC: 4.48 MIL/uL (ref 4.22–5.81)
RDW: 19 % — ABNORMAL HIGH (ref 11.5–15.5)
WBC: 11.6 10*3/uL — ABNORMAL HIGH (ref 4.0–10.5)

## 2013-08-23 NOTE — Patient Instructions (Signed)
[facility] Discharge Instructions  RECOMMENDATIONS MADE BY THE CONSULTANT AND ANY TEST RESULTS WILL BE SENT TO YOUR REFERRING PHYSICIAN.  We will see you in 3 weeks for follow up with the doctor. Please call for any questions of concerns.   Thank you for choosing [facility] to provide your oncology and hematology care.  To afford each patient quality time with our providers, please arrive at least 15 minutes before your scheduled appointment time.  With your help, our goal is to use those 15 minutes to complete the necessary work-up to ensure our physicians have the information they need to help with your evaluation and healthcare recommendations.    Effective January 1st, 2014, we ask that you re-schedule your appointment with our physicians should you arrive 10 or more minutes late for your appointment.  We strive to give you quality time with our providers, and arriving late affects you and other patients whose appointments are after yours.    Again, thank you for choosing [facility].  Our hope is that these requests will decrease the amount of time that you wait before being seen by our physicians.       _____________________________________________________________  Should you have questions after your visit to [facility], please contact our office at [phone] between the hours of 8:30 a.m. and 5:00 p.m.  Voicemails left after 4:30 p.m. will not be returned until the following business day.  For prescription refill requests, have your pharmacy contact our office with your prescription refill request.

## 2013-08-23 NOTE — Progress Notes (Signed)
Labs drawn for cbc/diff, cmp.

## 2013-08-24 ENCOUNTER — Ambulatory Visit
Admission: RE | Admit: 2013-08-24 | Discharge: 2013-08-24 | Disposition: A | Discharge status: Home / Self Care | Payer: Medicare Other | Source: Ambulatory Visit | Attending: Radiation Oncology | Admitting: Radiation Oncology

## 2013-08-24 ENCOUNTER — Encounter: Payer: Self-pay | Admitting: Radiation Oncology

## 2013-08-24 VITALS — BP 151/95 | HR 92 | Temp 98.4°F | Resp 20 | Wt 166.7 lb

## 2013-08-24 DIAGNOSIS — C7931 Secondary malignant neoplasm of brain: Secondary | ICD-10-CM

## 2013-08-24 HISTORY — DX: Other long term (current) drug therapy: Z79.899

## 2013-08-24 HISTORY — DX: Personal history of irradiation: Z92.3

## 2013-08-24 HISTORY — DX: Long term (current) use of other immunomodulators and immunosuppressants: Z79.69

## 2013-08-24 NOTE — Progress Notes (Signed)
Pt denies pain, HA, nausea, dizziness. He states he continues to have blurred vision and states "my head feels cloudy". He is unable to further describe.  Pt is weak, appetite returning after being treated for thrush. Pt to begin oral chemotherapy of Stivarga when he receives medication in mail. Pt takes Decadron 4 mg daily.

## 2013-08-25 ENCOUNTER — Encounter: Payer: Self-pay | Admitting: Radiation Oncology

## 2013-08-25 NOTE — Progress Notes (Signed)
Radiation Oncology         [phone] ________________________________  Name: Martin Maldonado MRN: [id number]  Date: 08/24/2013  DOB: [birth year]  Follow-Up Visit Note  Outpatient  CC: [name], MD  [name], MD  Diagnosis and Prior Radiotherapy:   Right cerebellar Brain Metastasis, Rectal Cancer  Indication for treatment: Palliative  Radiation treatment dates: 07/23/2013  Site/Beams/dose/energy: Right cerebellar target was treated using 4 Dynamic Conformal Arcs to a prescription dose of 20 Gy with SRS. 6 FFF MV photons used.  Narrative:  The patient returns today for routine follow-up.  His HA's are much better (left temporal region).  He was recently treated for thrush. Decadron has been tapered by med/onc to 4mg  daily.  No new neurologic issues.  Right arm became swollen, associated with cellulitis of right elbow from leaning on it while sleeping   / lounging.  This is now improving, and no doppler US has been recommended as of yet.                            ALLERGIES:  is allergic to erbitux.  Meds: Current Outpatient Prescriptions  Medication Sig Dispense Refill  . albuterol (PROVENTIL HFA;VENTOLIN HFA) 108 (90 BASE) MCG/ACT inhaler Inhale 2 puffs into the lungs every 6 (six) hours as needed for wheezing or shortness of breath.  1 Inhaler  2  . amLODipine (NORVASC) 10 MG tablet Take 0.5 tablets (5 mg total) by mouth daily.  30 tablet  6  . aspirin 81 MG tablet Take 81 mg by mouth daily.        . Aspirin-Salicylamide-Caffeine (BC HEADACHE PO) Take 2 packets by mouth 4 (four) times daily as needed (headache).      Marland Kitchen dexamethasone (DECADRON) 4 MG tablet Take 4 mg by mouth daily.      . Diphenhyd-Hydrocort-Nystatin (FIRST-DUKES MOUTHWASH) SUSP Use as directed 5 mLs in the mouth or throat 4 (four) times daily as needed.  300 mL  1  . diphenoxylate-atropine (LOMOTIL) 2.5-0.025 MG per tablet Take 2 tablets every 2-4 hours to control diarrhea  120 tablet  0  . lidocaine (XYLOCAINE) 2  % solution 1 teaspoonful rubbed on gums for discomfort. May mixed with 5 cc of water and gargle as needed and spit out.  100 mL  2  . lisinopril (PRINIVIL,ZESTRIL) 20 MG tablet Take 40 mg by mouth daily.      Marland Kitchen loperamide (IMODIUM) 2 MG capsule Take 2 mg by mouth as needed for diarrhea or loose stools.      Marland Kitchen LORazepam (ATIVAN) 0.5 MG tablet Take 1 tablet (0.5 mg total) by mouth every 8 (eight) hours as needed (nausea/vomiting.  Take PO or SL.).  45 tablet  2  . metoprolol (LOPRESSOR) 50 MG tablet Take 50 mg by mouth 3 (three) times daily.      . multivitamin (THERAGRAN) per tablet Take 1 tablet by mouth daily.        Marland Kitchen oxyCODONE (OXY IR/ROXICODONE) 5 MG immediate release tablet Take 1 tablet (5 mg total) by mouth every 4 (four) hours as needed for severe pain.  100 tablet  0  . pantoprazole (PROTONIX) 20 MG tablet Take 1 tablet (20 mg total) by mouth daily.  90 tablet  3  . prochlorperazine (COMPAZINE) 10 MG tablet Take 1 tablet (10 mg total) by mouth every 6 (six) hours as needed for nausea, vomiting or refractory nausea / vomiting.  45 tablet  2  .  triamterene-hydrochlorothiazide (MAXZIDE-25) 37.5-25 MG per tablet Take 1 tablet by mouth daily.  90 tablet  3  . regorafenib (STIVARGA) 40 MG tablet Take 2 tablets daily for 21 days , rest 7 days and repeat taking the medication with low fat meal. Caution: Chemotherapy.  56 tablet  1   No current facility-administered medications for this encounter.   Facility-Administered Medications Ordered in Other Encounters  Medication Dose Route Frequency Provider Last Rate Last Dose  . sodium chloride 0.9 % injection 10 mL  10 mL Intravenous PRN Baird Cancer, PA-C   10 mL at 09/19/12 1037  . sucralfate (CARAFATE) 1 GM/10ML suspension 1 g  1 g Oral TID WC & HS Farrel Gobble, MD        Physical Findings: The patient is in no acute distress. Patient is alert and oriented.  weight is 166 lb 11.2 oz (75.615 kg). His oral temperature is 98.4 F (36.9 C).  His blood pressure is 151/95 and his pulse is 92. His respiration is 20. Marland Kitchen   No oral thrush.  No neurologic focality. Walking independently.  Coordination, speech, strength, cranial nerves intact. Slight swelling of right arm with erythema of right elbow.    Lab Findings: Lab Results  Component Value Date   WBC 11.6* 08/23/2013   HGB 13.9 08/23/2013   HCT 41.5 08/23/2013   MCV 92.6 08/23/2013   PLT 169 08/23/2013    Radiographic Findings: No results found.  Impression/Plan:  Doing relatively well.  HA's improved.  Continue Decadron taper per medical oncology - appreciate their help in management of this.  I will defer to them on continued management of his right arm swelling, as it is improving per Mr [place]. He will transition to an oral agent for his systemic therapy - Stivarga.  I will see him back in about 2 mo with a f/u Brain MRI. He is pleased with this plan.   _____________________________________   Eppie Gibson, MD

## 2013-09-13 ENCOUNTER — Encounter (HOSPITAL_COMMUNITY): Payer: Self-pay

## 2013-09-13 ENCOUNTER — Encounter (HOSPITAL_BASED_OUTPATIENT_CLINIC_OR_DEPARTMENT_OTHER): Payer: Medicare Other

## 2013-09-13 VITALS — BP 148/92 | HR 81 | Temp 98.0°F | Resp 20 | Wt 159.8 lb

## 2013-09-13 DIAGNOSIS — C7800 Secondary malignant neoplasm of unspecified lung: Secondary | ICD-10-CM

## 2013-09-13 DIAGNOSIS — C7931 Secondary malignant neoplasm of brain: Secondary | ICD-10-CM

## 2013-09-13 DIAGNOSIS — C20 Malignant neoplasm of rectum: Secondary | ICD-10-CM

## 2013-09-13 DIAGNOSIS — C787 Secondary malignant neoplasm of liver and intrahepatic bile duct: Secondary | ICD-10-CM

## 2013-09-13 DIAGNOSIS — C7949 Secondary malignant neoplasm of other parts of nervous system: Secondary | ICD-10-CM

## 2013-09-13 LAB — COMPREHENSIVE METABOLIC PANEL
ALT: 32 U/L (ref 0–53)
AST: 29 U/L (ref 0–37)
Albumin: 3.1 g/dL — ABNORMAL LOW (ref 3.5–5.2)
Alkaline Phosphatase: 114 U/L (ref 39–117)
BILIRUBIN TOTAL: 0.5 mg/dL (ref 0.3–1.2)
BUN: 17 mg/dL (ref 6–23)
CALCIUM: 8.7 mg/dL (ref 8.4–10.5)
CO2: 24 mEq/L (ref 19–32)
Chloride: 100 mEq/L (ref 96–112)
Creatinine, Ser: 1.16 mg/dL (ref 0.50–1.35)
GFR calc Af Amer: 80 mL/min — ABNORMAL LOW (ref >90)
GFR calc non Af Amer: 69 mL/min — ABNORMAL LOW (ref >90)
Glucose, Bld: 102 mg/dL — ABNORMAL HIGH (ref 70–99)
Potassium: 3.2 mEq/L — ABNORMAL LOW (ref 3.7–5.3)
Sodium: 142 mEq/L (ref 137–147)
TOTAL PROTEIN: 6.7 g/dL (ref 6.0–8.3)

## 2013-09-13 LAB — CBC WITH DIFFERENTIAL/PLATELET
BASOS PCT: 0 % (ref 0–1)
Basophils Absolute: 0 10*3/uL (ref 0.0–0.1)
Eosinophils Absolute: 0.1 10*3/uL (ref 0.0–0.7)
Eosinophils Relative: 1 % (ref 0–5)
HCT: 45.2 % (ref 39.0–52.0)
HEMOGLOBIN: 15.5 g/dL (ref 13.0–17.0)
LYMPHS PCT: 16 % (ref 12–46)
Lymphs Abs: 2.1 10*3/uL (ref 0.7–4.0)
MCH: 31.9 pg (ref 26.0–34.0)
MCHC: 34.3 g/dL (ref 30.0–36.0)
MCV: 93 fL (ref 78.0–100.0)
Monocytes Absolute: 0.8 10*3/uL (ref 0.1–1.0)
Monocytes Relative: 6 % (ref 3–12)
NEUTROS PCT: 77 % (ref 43–77)
Neutro Abs: 10.3 10*3/uL — ABNORMAL HIGH (ref 1.7–7.7)
Platelets: 250 10*3/uL (ref 150–400)
RBC: 4.86 MIL/uL (ref 4.22–5.81)
RDW: 18 % — ABNORMAL HIGH (ref 11.5–15.5)
WBC: 13.3 10*3/uL — ABNORMAL HIGH (ref 4.0–10.5)

## 2013-09-13 MED ORDER — SODIUM CHLORIDE 0.9 % IJ SOLN
10.0000 mL | INTRAMUSCULAR | Status: DC | PRN
  Administered 2013-09-13: 10 mL via INTRAVENOUS

## 2013-09-13 MED ORDER — HEPARIN SOD (PORK) LOCK FLUSH 100 UNIT/ML IV SOLN
500.0000 [IU] | Freq: Once | INTRAVENOUS | Status: AC
  Administered 2013-09-13: 500 [IU] via INTRAVENOUS
  Filled 2013-09-13: qty 5

## 2013-09-13 NOTE — Patient Instructions (Signed)
[facility] Discharge Instructions  RECOMMENDATIONS MADE BY THE CONSULTANT AND ANY TEST RESULTS WILL BE SENT TO YOUR REFERRING PHYSICIAN.  EXAM FINDINGS BY THE PHYSICIAN TODAY AND SIGNS OR SYMPTOMS TO REPORT TO CLINIC OR PRIMARY PHYSICIAN: Exam and findings as discussed by Dr. Barnet Glasgow.  You seem to be doing better.  We will check your blood counts today and see how you are doing.  Report uncontrolled nausea, vomiting, fevers, etc.  MEDICATIONS PRESCRIBED:  Continue stivarga as ordered  INSTRUCTIONS/FOLLOW-UP: Follow-up in 5 weeks.  Thank you for choosing [facility] to provide your oncology and hematology care.  To afford each patient quality time with our providers, please arrive at least 15 minutes before your scheduled appointment time.  With your help, our goal is to use those 15 minutes to complete the necessary work-up to ensure our physicians have the information they need to help with your evaluation and healthcare recommendations.    Effective January 1st, 2014, we ask that you re-schedule your appointment with our physicians should you arrive 10 or more minutes late for your appointment.  We strive to give you quality time with our providers, and arriving late affects you and other patients whose appointments are after yours.    Again, thank you for choosing [facility].  Our hope is that these requests will decrease the amount of time that you wait before being seen by our physicians.       _____________________________________________________________  Should you have questions after your visit to [facility], please contact our office at [phone] between the hours of 8:30 a.m. and 4:30 p.m.  Voicemails left after 4:30 p.m. will not be returned until the following business day.  For prescription refill requests, have your pharmacy contact our office with your prescription refill request.     _______________________________________________________________  We hope that we have given you very good care.  You may receive a patient satisfaction survey in the mail, please complete it and return it as soon as possible.  We value your feedback!

## 2013-09-13 NOTE — Progress Notes (Signed)
Martin Maldonado presented for labwork. Labs per MD order drawn via Portacath located in the right chest wall accessed with  H 20 needle. Good blood return present. Procedure without incident.  Needle removed intact. Patient tolerated procedure well.

## 2013-09-13 NOTE — Progress Notes (Signed)
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 Rectal cancer with lung, liver, abdominal lymph node, and br    CURRENT THERAPY: Stivarga 40 mg twice a day for 3 weeks on and one-week off  INTERVAL HISTORY: Martin Maldonado 58 y.o. male returns for followup after initiation of stivarga salvage therapy for stage IV rectal cancer with liver, abdominal lymph node, lung, and brain metastases, status post SBRT to a single cerebellar lesion receiving 20 gray on 07/23/2013, status post 1 cycle of FOLFIRI plus Avastin with intolerance secondary to severe diarrhea with second cycle given utilizing only leucovorin/5-FU plus Avastin resulting in grade 4 esophagitis. At that point it was decided to discontinue all 5-FU based therapy and to treat systemically with Stivarga started on 08/30/2013. He is eating well but is concerned because he has lost some weight. He denies any lower 70 swelling or redness and continues on dexamethasone 4 mg daily. He has occasional headache with nonproductive cough. He does have nasal drip with no sore throat that is persistent. Denies any skin rash, diarrhea, lower extremity swelling or redness, chest pain, PND, orthopnea, palpitations. Hoarseness persists.  MEDICAL HISTORY: Past Medical History  Diagnosis Date  . Hypertension   . Rectal cancer 2008    Adenocarcinoma; AP resection in 2008; recurrence with lung metastases in 2011  . Arteriosclerotic cardiovascular disease (ASCVD) 2011    CABG surgery in 2011  . Hyperlipidemia   . Tobacco abuse, in remission  09/02/2009    50-pack-year consumption discontinued in 2011  . Splenomegaly 01/23/2009    Additional GI history includes small bowel obstruction, requiring laparotomy and lysis of adhesions in 2009, remote peptic ulcer disease and postoperative perirectal infection  . ALCOHOL ABUSE, HX OF 01/23/2009  . Brain metastasis 07/02/2013  . On antineoplastic chemotherapy     Leucovorin/5-FU plus Avastin  . S/P radiation therapy  07/23/2013    Right cerebellar    INTERIM HISTORY: has HYPERLIPIDEMIA; Tobacco abuse, in remission; HYPERTENSION; COPD; SPLENOMEGALY; SMALL BOWEL OBSTRUCTION, HX OF; Rectal cancer; CAD (coronary artery disease) of artery bypass graft; and Brain metastasis on his problem list.     Rectal cancer    05/09/2006  Surgery  Rectum, abdomino-perineal resection demonstrating invasive adenocarcinoma of rectum, moderately differentiated, with invasion into the perirectal adipose tissue with positive margin in the perirectal soft tissue. 1/12 lymph nodes positive for disease    06/14/2006 - 09/28/2006  Chemotherapy  Oxaliplatin, Xeloda, and Avastin x 6 cycles    10/12/2006 - 10/26/2006  Chemotherapy  Avastin    06/10/2009  Cancer Staging  Lung biopsy positive for adenocarcinoma of colorectal origin    06/30/2009 - 01/06/2010  Chemotherapy  FOLFOX + Avastin x 13 cycles    08/10/2010 - 12/09/2010  Chemotherapy  FOLFIRI + Avastin x 8 cycles    05/04/2011 - 10/12/2011  Chemotherapy  Cetuximab + 5FU/Leucovorin x 12 cycles    01/24/2012 - 07/25/2012  Chemotherapy  Cetuximab + 5FU/Leucovorin x 13 cycles    10/31/2012  Adverse Reaction  Anaphylaxis to Cetuximab    10/31/2012 - 10/31/2012  Chemotherapy  Cetuximab    11/14/2012 - 12/12/2012  Chemotherapy  5FU/Leucovorin + Avastin    07/02/2013  Imaging  CT CAP and head- progression of disease with brain met. See brain met oncology history for details.    07/03/2013 - 08/06/2013  Chemotherapy  FOLFIRI + Avastin. Discontinued following cycle 1 due to severe diarrhea     07/23/2013 - 07/23/2013  Radiation Therapy  Stereotactic 20Gy to right cerebellar tumor, Dr. Isidore Moos    08/07/2013 -  Chemotherapy  Leucovorin/5-FU plus Avastin x1 cycle with severe grade 4 esophagitis causing discontinuation     Chemotherapy  Stivarga 80 mg 21 days on and 7 day respite     Brain metastasis    07/02/2013  Initial Diagnosis  CT head- Mass with surrounding vasogenic edema measuring 1.5 x 1.5 cm in the lateral right cerebellum. This mass shows irregular rim enhancement.    07/19/2013  Imaging  MRI brain- 1.2 x 1 x 0.9 cm hemorrhagic enhancing lesion lateral aspect of the right cerebellum may represent metastatic lesion    07/23/2013 - 07/23/2013  Radiation Therapy  SRS by Dr. Isidore Moos    ALLERGIES:  is allergic to erbitux.  MEDICATIONS: has a current medication list which includes the following prescription(s): albuterol, amlodipine, aspirin, dexamethasone, lisinopril, lorazepam, metoprolol, multivitamin, oxycodone, regorafenib, aspirin-salicylamide-caffeine, first-dukes mouthwash, diphenoxylate-atropine, lidocaine, loperamide, pantoprazole, prochlorperazine, and triamterene-hydrochlorothiazide, and the following Facility-Administered Medications: sodium chloride, sodium chloride, and sucralfate.  SURGICAL HISTORY:  Past Surgical History  Procedure Laterality Date  . Irrigation and debridement sebaceous cyst    . Laparoscopic lysis intestinal adhesions  2009    With incidental appendectomy  . Coronary artery bypass graft  2011    2 vessel  . Abdominoperineal proctocolectomy  2008  . Portacath placement  2011  . Colostomy      FAMILY HISTORY: family history includes Cancer in his maternal grandmother and mother.  SOCIAL HISTORY:  reports that he quit smoking about 4 years ago. He has never used smokeless tobacco. He reports that he does not drink alcohol or use illicit drugs.  REVIEW OF SYSTEMS:  Other than that discussed above is noncontributory.  PHYSICAL EXAMINATION: ECOG  PERFORMANCE STATUS: 1 - Symptomatic but completely ambulatory  Blood pressure 148/92, pulse 81, temperature 98 F (36.7 C), temperature source Oral, resp. rate 20, weight 159 lb 12.8 oz (72.485 kg).  GENERAL:alert, no distress and comfortable SKIN: skin color, texture, turgor are normal, no rashes or significant lesions EYES: PERLA; Conjunctiva are pink and non-injected, sclera clear SINUSES: No redness or tenderness over maxillary or ethmoid sinuses OROPHARYNX:no exudate, no erythema on lips, buccal mucosa, or tongue. NECK: supple, thyroid normal size, non-tender, without nodularity. No masses CHEST: Increased AP diameter with light port in place. LYMPH:  no palpable lymphadenopathy in the cervical, axillary or inguinal LUNGS: clear to auscultation and percussion with normal breathing effort HEART: regular rate & rhythm and no murmurs. ABDOMEN:abdomen soft, non-tender and normal bowel sounds. Colostomy in place with no evidence of stomal purulence or hemorrhage. MUSCULOSKELETAL:no cyanosis of digits and no clubbing. Range of motion normal.  NEURO: alert & oriented x 3 with fluent speech, no focal motor/sensory deficits   LABORATORY DATA: Infusion on 08/23/2013  Component Date Value Ref Range Status  . WBC 08/23/2013 11.6* 4.0 - 10.5 K/uL Final  . RBC 08/23/2013 4.48  4.22 - 5.81 MIL/uL Final  . Hemoglobin 08/23/2013 13.9  13.0 - 17.0 g/dL Final  . HCT 08/23/2013 41.5  39.0 - 52.0 % Final  . MCV  08/23/2013 92.6  78.0 - 100.0 fL Final  . MCH 08/23/2013 31.0  26.0 - 34.0 pg Final  . MCHC 08/23/2013 33.5  30.0 - 36.0 g/dL Final  . RDW 08/23/2013 19.0* 11.5 - 15.5 % Final  . Platelets 08/23/2013 169  150 - 400 K/uL Final  . Neutrophils Relative % 08/23/2013 88* 43 - 77 % Final  . Neutro Abs 08/23/2013 10.1* 1.7 - 7.7 K/uL Final  . Lymphocytes Relative 08/23/2013 7* 12 - 46 % Final  . Lymphs Abs 08/23/2013 0.8  0.7 - 4.0 K/uL Final  . Monocytes Relative 08/23/2013 5  3 - 12 % Final  .  Monocytes Absolute 08/23/2013 0.6  0.1 - 1.0 K/uL Final  . Eosinophils Relative 08/23/2013 0  0 - 5 % Final  . Eosinophils Absolute 08/23/2013 0.0  0.0 - 0.7 K/uL Final  . Basophils Relative 08/23/2013 0  0 - 1 % Final  . Basophils Absolute 08/23/2013 0.0  0.0 - 0.1 K/uL Final  . Sodium 08/23/2013 138  137 - 147 mEq/L Final  . Potassium 08/23/2013 3.5* 3.7 - 5.3 mEq/L Final  . Chloride 08/23/2013 96  96 - 112 mEq/L Final  . CO2 08/23/2013 24  19 - 32 mEq/L Final  . Glucose, Bld 08/23/2013 148* 70 - 99 mg/dL Final  . BUN 08/23/2013 28* 6 - 23 mg/dL Final  . Creatinine, Ser 08/23/2013 0.88  0.50 - 1.35 mg/dL Final  . Calcium 08/23/2013 8.9  8.4 - 10.5 mg/dL Final  . Total Protein 08/23/2013 6.4  6.0 - 8.3 g/dL Final  . Albumin 08/23/2013 3.1* 3.5 - 5.2 g/dL Final  . AST 08/23/2013 26  0 - 37 U/L Final  . ALT 08/23/2013 27  0 - 53 U/L Final  . Alkaline Phosphatase 08/23/2013 98  39 - 117 U/L Final  . Total Bilirubin 08/23/2013 0.4  0.3 - 1.2 mg/dL Final  . GFR calc non Af Amer 08/23/2013 >90  >90 mL/min Final  . GFR calc Af Amer 08/23/2013 >90  >90 mL/min Final   Comment: (NOTE)                          The eGFR has been calculated using the CKD EPI equation.                          This calculation has not been validated in all clinical situations.                          eGFR's persistently <90 mL/min signify possible Chronic Kidney                          Disease.    PATHOLOGY: K-ras wild-type adenocarcinoma intolerant of the EGFR inhibitors  Urinalysis    Component Value Date/Time   COLORURINE YELLOW 03/02/2013 1400   APPEARANCEUR CLEAR 03/02/2013 1400   LABSPEC >1.030* 07/24/2013 0826   PHURINE 5.5 07/24/2013 0826   GLUCOSEU NEGATIVE 07/24/2013 0826   HGBUR NEGATIVE 07/24/2013 0826   BILIRUBINUR NEGATIVE 07/24/2013 0826   KETONESUR NEGATIVE 07/24/2013 0826   PROTEINUR TRACE* 07/24/2013 0826   UROBILINOGEN 0.2 07/24/2013 0826   NITRITE NEGATIVE 07/24/2013 0826   LEUKOCYTESUR NEGATIVE  07/24/2013 0826    RADIOGRAPHIC STUDIES:  CT Abdomen Pelvis W Contrast Status: Final result         PACS Images  Show images for CT Abdomen Pelvis W Contrast         Study Result    CLINICAL DATA: Restaging metastatic rectal carcinoma, history of  hypertension, hyperlipidemia, former smoker, alcohol abuse  EXAM:  CT CHEST, ABDOMEN, AND PELVIS WITH CONTRAST  TECHNIQUE:  Multidetector CT imaging of the chest, abdomen and pelvis was  performed following the standard protocol during bolus  administration of intravenous contrast. Sagittal and coronal MPR  images reconstructed from axial data set.  CONTRAST: 55mL OMNIPAQUE IOHEXOL 300 MG/ML SOLN IV. Dilute oral  contrast.  COMPARISON: CT chest 03/02/2013, CT chest abdomen and pelvis  10/17/2012  FINDINGS:  CT CHEST FINDINGS  Vascular structures grossly patent on nondedicated exam.  Mediastinal and bilateral hilar adenopathy increased since previous  study.  AP window lymph node 17 mm short axis image 25 unchanged.  Precarinal lymph node 20 mm short axis image 24, previously 7 mm.  Prevascular lymph node 16 mm short axis image 23, previously 6 mm.  Right hilar node at 24 mm image 29, previously 12 mm.  Postsurgical atherosclerotic disease with postsurgical changes of  CABG.  Pulmonary metastases increased in sizes and number since previous  study.  Index lesion superior segment right lower lobe 5.3 x 4.0 cm image 35  previously 4.2 x 3.4 cm.  Index lesion left lower lobe 5.2 x 3.9 cm image 41, previously 4.5 x  3.4 cm.  Left pleural metastases with moderate-sized left pleural effusion  increased since previous exam.  Minimal pericardial fluid and nodularity question metastatic  disease.  No osseous metastases.  CT ABDOMEN AND PELVIS FINDINGS  9 mm nodule right lobe liver image 66 unchanged.  Tiny calcified granuloma within liver.  Tiny calcified gallstone.  Liver, spleen, pancreas, kidneys, and adrenal glands  normal.  Lax anterior abdominal wall fascia with small supraumbilical hernia  containing fat  Left lower quadrant descending colostomy post APR.  Scattered atherosclerotic calcifications.  Unremarkable bladder and prostate.  Stomach decompressed, unable to assess wall thickness.  Large and small bowel loops otherwise unremarkable.  Minimally enlarged left para-aortic lymph node 11 mm short axis  image 75, new.  No mass, additional adenopathy, free fluid, free air, or acute  osseous findings.  IMPRESSION:  Progression of pulmonary metastatic disease and thoracic adenopathy  since previous study.  New minimally enlarged left para-aortic lymph node.  Cholelithiasis.  Stable low-attenuation right lobe liver lesion.  Electronically Signed  By: Lavonia Dana M.D.  On: 07/02/2013       CT Head W Wo Contrast Status: Final result         PACS Images    Show images for CT Head W Wo Contrast         Study Result    CLINICAL DATA: Rectal carcinoma with dizziness and blurred vision  EXAM:  CT HEAD WITHOUT AND WITH CONTRAST  TECHNIQUE:  Contiguous axial images were obtained from the base of the skull  through the vertex without and with intravenous contrast  CONTRAST: 80 mL Omnipaque 300 nonionic  COMPARISON: Noncontrast study March 02, 2013  FINDINGS:  The ventricles are normal in size and configuration. There is now  vasogenic edema in the lateral right cerebellum. Within this area of  vasogenic edema, there is a mass with irregular rim enhancement  measuring 1.5 x 1.5 cm. No other mass is appreciable on this study.  There is no hemorrhage, extra-axial fluid, or midline shift.  Elsewhere, gray-white compartments appear normal. No acute infarct  is  apparent. Mass  IMPRESSION:  Mass with surrounding vasogenic edema measuring 1.5 x 1.5 cm in the  lateral right cerebellum. This mass shows irregular rim enhancement.  This finding is consistent with neoplasia, and in the  setting of  known rectal carcinoma most likely represents a metastatic focus. No  other lesion identified.  Electronically Signed  By: Lowella Grip M.D.  On: 07/02/2013 10:02      ASSESSMENT:  1. Stage IV rectal cancer with liver, abdominal lymph node, lung, and brain metastases, status post SBRT to a singular cerebellar lesion receiving 20 gray on 07/23/2013. S/P 1 cycle of FOLFIRI +Avastin with intolerance secondary to severe diarrhea requiring discontinuation. Leucovorin/5-FU + Avastin x 1 cycles resulting in grade 4 esophagitis requiring discontinuation of all 5-FU based chemotherapies, tolerating stivarga well started 80 mg daily on 08/30/2013   2. Intolerance to Irinotecan causing severe diarrhea  3. Intolerance to 5-FU causing grade 4 esophagitis   PLAN:  #1. Complete 3 weeks of Stivarga, rest one week, and resume. #2. Followup in 5 weeks with CBC, chem profile, CEA   All questions were answered. The patient knows to call the clinic with any problems, questions or concerns. We can certainly see the patient much sooner if necessary.   I spent 25 minutes counseling the patient face to face. The total time spent in the appointment was 30 minutes.    Doroteo Bradford, MD 09/13/2013 1:42 PM  DISCLAIMER:  This note was dictated with voice recognition software.  Similar sounding words can inadvertently be transcribed inaccurately and may not be corrected upon review.

## 2013-09-14 LAB — CEA: CEA: 47.4 ng/mL — AB (ref 0.0–5.0)

## 2013-09-18 ENCOUNTER — Other Ambulatory Visit (HOSPITAL_COMMUNITY): Payer: Self-pay | Admitting: Oncology

## 2013-09-18 ENCOUNTER — Other Ambulatory Visit (HOSPITAL_COMMUNITY): Payer: Self-pay | Admitting: Hematology and Oncology

## 2013-09-18 DIAGNOSIS — C7931 Secondary malignant neoplasm of brain: Secondary | ICD-10-CM

## 2013-09-18 DIAGNOSIS — C20 Malignant neoplasm of rectum: Secondary | ICD-10-CM

## 2013-09-18 MED ORDER — LORAZEPAM 0.5 MG PO TABS: 0.5000 mg | ORAL_TABLET | Freq: Three times a day (TID) | ORAL | Status: AC | PRN

## 2013-09-18 MED ORDER — REGORAFENIB 40 MG PO TABS: ORAL_TABLET | ORAL | Status: DC

## 2013-09-25 ENCOUNTER — Other Ambulatory Visit: Payer: Self-pay | Admitting: Radiation Therapy

## 2013-09-25 DIAGNOSIS — C7949 Secondary malignant neoplasm of other parts of nervous system: Principal | ICD-10-CM

## 2013-09-25 DIAGNOSIS — C7931 Secondary malignant neoplasm of brain: Secondary | ICD-10-CM

## 2013-09-27 ENCOUNTER — Telehealth (HOSPITAL_COMMUNITY): Payer: Self-pay | Admitting: Hematology and Oncology

## 2013-09-27 NOTE — Telephone Encounter (Signed)
Pt was approved for assist from [facility] EXT [id number] 08/21/13-08/22/14

## 2013-10-11 ENCOUNTER — Ambulatory Visit (HOSPITAL_COMMUNITY): Payer: Medicare Other | Admitting: Oncology

## 2013-10-17 ENCOUNTER — Encounter: Payer: Self-pay | Admitting: Radiation Oncology

## 2013-10-18 ENCOUNTER — Encounter (HOSPITAL_COMMUNITY): Payer: Self-pay

## 2013-10-18 ENCOUNTER — Other Ambulatory Visit (HOSPITAL_COMMUNITY): Payer: Self-pay | Admitting: Oncology

## 2013-10-18 ENCOUNTER — Encounter (HOSPITAL_COMMUNITY): Payer: Medicare Other

## 2013-10-18 ENCOUNTER — Encounter (HOSPITAL_COMMUNITY): Payer: Medicare Other | Attending: Hematology and Oncology

## 2013-10-18 ENCOUNTER — Telehealth (HOSPITAL_COMMUNITY): Payer: Self-pay

## 2013-10-18 ENCOUNTER — Other Ambulatory Visit (HOSPITAL_COMMUNITY): Payer: Self-pay | Admitting: Hematology and Oncology

## 2013-10-18 VITALS — BP 138/97 | HR 115 | Temp 97.7°F | Resp 21 | Wt 159.7 lb

## 2013-10-18 DIAGNOSIS — C787 Secondary malignant neoplasm of liver and intrahepatic bile duct: Secondary | ICD-10-CM | POA: Diagnosis present

## 2013-10-18 DIAGNOSIS — R5383 Other fatigue: Secondary | ICD-10-CM | POA: Diagnosis present

## 2013-10-18 DIAGNOSIS — C7949 Secondary malignant neoplasm of other parts of nervous system: Secondary | ICD-10-CM

## 2013-10-18 DIAGNOSIS — C7800 Secondary malignant neoplasm of unspecified lung: Secondary | ICD-10-CM | POA: Diagnosis present

## 2013-10-18 DIAGNOSIS — C7931 Secondary malignant neoplasm of brain: Secondary | ICD-10-CM | POA: Diagnosis present

## 2013-10-18 DIAGNOSIS — C20 Malignant neoplasm of rectum: Secondary | ICD-10-CM | POA: Diagnosis not present

## 2013-10-18 DIAGNOSIS — R0602 Shortness of breath: Secondary | ICD-10-CM

## 2013-10-18 DIAGNOSIS — R5381 Other malaise: Secondary | ICD-10-CM | POA: Insufficient documentation

## 2013-10-18 LAB — CBC WITH DIFFERENTIAL/PLATELET
Basophils Absolute: 0 10*3/uL (ref 0.0–0.1)
Basophils Relative: 0 % (ref 0–1)
Eosinophils Absolute: 0.1 10*3/uL (ref 0.0–0.7)
Eosinophils Relative: 1 % (ref 0–5)
HEMATOCRIT: 45.3 % (ref 39.0–52.0)
HEMOGLOBIN: 15.3 g/dL (ref 13.0–17.0)
LYMPHS PCT: 8 % — AB (ref 12–46)
Lymphs Abs: 1.1 10*3/uL (ref 0.7–4.0)
MCH: 31.8 pg (ref 26.0–34.0)
MCHC: 33.8 g/dL (ref 30.0–36.0)
MCV: 94.2 fL (ref 78.0–100.0)
MONO ABS: 0.5 10*3/uL (ref 0.1–1.0)
MONOS PCT: 4 % (ref 3–12)
NEUTROS ABS: 11.8 10*3/uL — AB (ref 1.7–7.7)
NEUTROS PCT: 88 % — AB (ref 43–77)
Platelets: 185 10*3/uL (ref 150–400)
RBC: 4.81 MIL/uL (ref 4.22–5.81)
RDW: 16.4 % — ABNORMAL HIGH (ref 11.5–15.5)
WBC: 13.5 10*3/uL — AB (ref 4.0–10.5)

## 2013-10-18 LAB — COMPREHENSIVE METABOLIC PANEL
ALT: 30 U/L (ref 0–53)
ANION GAP: 14 (ref 5–15)
AST: 25 U/L (ref 0–37)
Albumin: 2.7 g/dL — ABNORMAL LOW (ref 3.5–5.2)
Alkaline Phosphatase: 123 U/L — ABNORMAL HIGH (ref 39–117)
BILIRUBIN TOTAL: 0.5 mg/dL (ref 0.3–1.2)
BUN: 19 mg/dL (ref 6–23)
CHLORIDE: 103 meq/L (ref 96–112)
CO2: 26 meq/L (ref 19–32)
Calcium: 8.8 mg/dL (ref 8.4–10.5)
Creatinine, Ser: 0.94 mg/dL (ref 0.50–1.35)
GFR calc Af Amer: >90 mL/min (ref >90)
GFR calc non Af Amer: >90 mL/min (ref >90)
Glucose, Bld: 88 mg/dL (ref 70–99)
Potassium: 3.3 mEq/L — ABNORMAL LOW (ref 3.7–5.3)
Sodium: 143 mEq/L (ref 137–147)
Total Protein: 6.4 g/dL (ref 6.0–8.3)

## 2013-10-18 LAB — TSH: TSH: 0.897 u[IU]/mL (ref 0.350–4.500)

## 2013-10-18 MED ORDER — SODIUM CHLORIDE 0.9 % IJ SOLN
10.0000 mL | INTRAMUSCULAR | Status: DC | PRN
  Administered 2013-10-18: 10 mL via INTRAVENOUS

## 2013-10-18 MED ORDER — ALBUTEROL SULFATE HFA 108 (90 BASE) MCG/ACT IN AERS: 2.0000 | INHALATION_SPRAY | Freq: Four times a day (QID) | RESPIRATORY_TRACT | Status: DC | PRN

## 2013-10-18 MED ORDER — HEPARIN SOD (PORK) LOCK FLUSH 100 UNIT/ML IV SOLN
500.0000 [IU] | Freq: Once | INTRAVENOUS | Status: AC
  Administered 2013-10-18: 500 [IU] via INTRAVENOUS
  Filled 2013-10-18: qty 5

## 2013-10-18 MED ORDER — IPRATROPIUM-ALBUTEROL 20-100 MCG/ACT IN AERS: 1.0000 | INHALATION_SPRAY | Freq: Four times a day (QID) | RESPIRATORY_TRACT | Status: AC

## 2013-10-18 MED ORDER — OXYCODONE HCL 5 MG PO TABS: ORAL_TABLET | ORAL | Status: DC

## 2013-10-18 NOTE — Telephone Encounter (Signed)
Requesting refills for Albuterol inhaler.

## 2013-10-18 NOTE — Patient Instructions (Signed)
[facility] Discharge Instructions  RECOMMENDATIONS MADE BY THE CONSULTANT AND ANY TEST RESULTS WILL BE SENT TO YOUR REFERRING PHYSICIAN.  EXAM FINDINGS BY THE PHYSICIAN TODAY AND SIGNS OR SYMPTOMS TO REPORT TO CLINIC OR PRIMARY PHYSICIAN: Exam and findings as discussed by Dr. Barnet Glasgow.  Stop the stivarga and we will switch it to 2 weeks on and 2 weeks off beginning 2 weeks from Saturday.  Will increase your decadron to 8 mg daily and will give you a different inhaler.  MEDICATIONS PRESCRIBED:  Oxycodone - take as directed Combivent inhaler - use as directed Increase decadron to 8 mg daily.  INSTRUCTIONS/FOLLOW-UP: Follow-up in 2 weeks.  Thank you for choosing [facility] to provide your oncology and hematology care.  To afford each patient quality time with our providers, please arrive at least 15 minutes before your scheduled appointment time.  With your help, our goal is to use those 15 minutes to complete the necessary work-up to ensure our physicians have the information they need to help with your evaluation and healthcare recommendations.    Effective January 1st, 2014, we ask that you re-schedule your appointment with our physicians should you arrive 10 or more minutes late for your appointment.  We strive to give you quality time with our providers, and arriving late affects you and other patients whose appointments are after yours.    Again, thank you for choosing [facility].  Our hope is that these requests will decrease the amount of time that you wait before being seen by our physicians.       _____________________________________________________________  Should you have questions after your visit to [facility], please contact our office at [phone] between the hours of 8:30 a.m. and 4:30 p.m.  Voicemails left after 4:30 p.m. will not be returned until the following business day.  For prescription refill requests,  have your pharmacy contact our office with your prescription refill request.    _______________________________________________________________  We hope that we have given you very good care.  You may receive a patient satisfaction survey in the mail, please complete it and return it as soon as possible.  We value your feedback!  _______________________________________________________________  Have you asked about our STAR program?  STAR stands for Survivorship Training and Rehabilitation, and this is a nationally recognized cancer care program that focuses on survivorship and rehabilitation.  Cancer and cancer treatments may cause problems, such as, pain, making you feel tired and keeping you from doing the things that you need or want to do. Cancer rehabilitation can help. Our goal is to reduce these troubling effects and help you have the best quality of life possible.  You may receive a survey from a nurse that asks questions about your current state of health.  Based on the survey results, all eligible patients will be referred to the [facility] program for an evaluation so we can better serve you!  A frequently asked questions sheet is available upon request.

## 2013-10-18 NOTE — Progress Notes (Signed)
[name] presented for labwork. Labs per MD order drawn via Portacath located in the right chest wall accessed with  H 20 needle. Good blood return present. Procedure without incident.  Needle removed intact. Patient tolerated procedure well.

## 2013-10-18 NOTE — Progress Notes (Signed)
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 - Plan: CBC with Differential, Comprehensive metabolic panel, CEA, TSH, Schedule Portacath Flush Appointment, heparin lock flush 100 unit/mL, sodium chloride 0.9 % injection 10 mL, CBC with Differential, Comprehensive metabolic panel, CEA, TSH  Liver metastases - Plan: CBC with Differential, Comprehensive metabolic panel, CEA, Schedule Portacath Flush Appointment, heparin lock flush 100 unit/mL, sodium chloride 0.9 % injection 10 mL, CBC with Differential, Comprehensive metabolic panel, CEA  Brain metastasis - Plan: CBC with Differential, Comprehensive metabolic panel, CEA, Schedule Portacath Flush Appointment, heparin lock flush 100 unit/mL, sodium chloride 0.9 % injection 10 mL, CBC with Differential, Comprehensive metabolic panel, CEA  Other fatigue - Plan: Schedule Portacath Flush Appointment, heparin lock flush 100 unit/mL, sodium chloride 0.9 % injection 10 mL  Chief Complaint  Patient presents with  . Rectal Cancer    mets to lung    CURRENT THERAPY: Stivarga 40 mg twice a day for 3 weeks on one week off.  INTERVAL HISTORY: KO BARDON 58 y.o. male returns for followup while on Stivarga salvage therapy at 40 mg twice a day for 3 weeks on and one week off every 28 days for stage IV rectal cancer with liver, abdominal lymph node, lung, and brain metastases, status post SBRT to a single cerebellar lesion receiving 20 gray on 07/23/2013, status post 1 cycle of FOLFIRI plus Avastin with intolerance secondary to severe diarrhea with second cycle given utilizing only leucovorin/5-FU plus Avastin resulting in grade 4 esophagitis. At that point it was decided to discontinue all 5-FU based therapy and to treat systemically with Stivarga which was started on 08/30/2013. He has not been seen since 09/11/2013. He is on day  19 of a cycle of stivarga at this time. He presents with a multiplicity of complaints including hot flashes, left shoulder pain, headache, abdominal discomfort, shortness of breath on exertion, dizziness, vertigo, with no symptomatology worsening over the past 2 weeks. He denies a diarrhea, melena, hematochezia, epistaxis, hemoptysis, chest pain, PND, orthopnea, skin rash, or seizures. He has been needing well.    MEDICAL HISTORY: Past Medical History  Diagnosis Date  . Hypertension   . Rectal cancer 2008    Adenocarcinoma; AP resection in 2008; recurrence with lung metastases in 2011  . Arteriosclerotic cardiovascular disease (ASCVD) 2011    CABG surgery in 2011  . Hyperlipidemia   . 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 Splenomegaly 01/23/2009    Additional GI history includes small bowel obstruction, requiring laparotomy and lysis of adhesions in 2009, remote peptic ulcer disease and postoperative perirectal infection  . ALCOHOL ABUSE, HX OF 01/23/2009  . Brain metastasis 07/02/2013  . Status post chemotherapy     Leucovorin/5-FU plus Avastin-discontinued due to Grade 4 Esohagitis  . S/P radiation therapy  07/23/2013    Right cerebellar  . On antineoplastic chemotherapy     Stivarga 80 mg 21 days on and 7 day respite      INTERIM HISTORY: has HYPERLIPIDEMIA; Tobacco abuse, in remission; HYPERTENSION; COPD; SPLENOMEGALY; SMALL BOWEL OBSTRUCTION, HX OF; Rectal cancer; CAD (coronary artery disease) of artery bypass graft; and Brain metastasis on his problem list.   Rectal cancer  05/09/2006  Surgery  Rectum, abdomino-perineal resection demonstrating invasive adenocarcinoma of rectum, moderately differentiated, with invasion into the perirectal adipose tissue  with positive margin in the perirectal soft tissue. 1/12 lymph nodes positive for disease  06/14/2006 - 09/28/2006  Chemotherapy  Oxaliplatin, Xeloda, and Avastin x 6 cycles  10/12/2006  - 10/26/2006  Chemotherapy  Avastin  06/10/2009  Cancer Staging  Lung biopsy positive for adenocarcinoma of colorectal origin  06/30/2009 - 01/06/2010  Chemotherapy  FOLFOX + Avastin x 13 cycles  08/10/2010 - 12/09/2010  Chemotherapy  FOLFIRI + Avastin x 8 cycles  05/04/2011 - 10/12/2011  Chemotherapy  Cetuximab + 5FU/Leucovorin x 12 cycles  01/24/2012 - 07/25/2012  Chemotherapy  Cetuximab + 5FU/Leucovorin x 13 cycles  10/31/2012  Adverse Reaction  Anaphylaxis to Cetuximab  10/31/2012 - 10/31/2012  Chemotherapy  Cetuximab  11/14/2012 - 12/12/2012  Chemotherapy  5FU/Leucovorin + Avastin  07/02/2013  Imaging  CT CAP and head- progression of disease with brain met. See brain met oncology history for details.  07/03/2013 - 08/06/2013  Chemotherapy  FOLFIRI + Avastin. Discontinued following cycle 1 due to severe diarrhea  07/23/2013 - 07/23/2013  Radiation Therapy  Stereotactic 20Gy to right cerebellar tumor, Dr. Basilio Cairo  08/07/2013 -  Chemotherapy  Leucovorin/5-FU plus Avastin x1 cycle with severe grade 4 esophagitis causing discontinuation  Chemotherapy  Stivarga 80 mg 21 days on and 7 day respite  Brain metastasis  07/02/2013  Initial Diagnosis  CT head- Mass with surrounding vasogenic edema measuring 1.5 x 1.5 cm in the lateral right cerebellum. This mass shows irregular rim enhancement.  07/19/2013  Imaging  MRI brain- 1.2 x 1 x 0.9 cm hemorrhagic enhancing lesion lateral aspect of the right cerebellum may represent metastatic lesion  07/23/2013 - 07/23/2013   ALLERGIES:  is allergic to erbitux.  MEDICATIONS: has a current medication list which includes the following prescription(s): albuterol, amlodipine, dexamethasone, lisinopril, lorazepam, metoprolol, multivitamin, regorafenib, aspirin, aspirin-salicylamide-caffeine, first-dukes mouthwash, diphenoxylate-atropine, fluconazole, ipratropium-albuterol, lidocaine, loperamide, morphine, oxycodone, pantoprazole, prochlorperazine, and  triamterene-hydrochlorothiazide, and the following Facility-Administered Medications: sodium chloride, sodium chloride, and sucralfate.  SURGICAL HISTORY:  Past Surgical History  Procedure Laterality Date  . Irrigation and debridement sebaceous cyst    . Laparoscopic lysis intestinal adhesions  2009    With incidental appendectomy  . Coronary artery bypass graft  2011    2 vessel  . Abdominoperineal proctocolectomy  2008  . Portacath placement  2011  . Colostomy      FAMILY HISTORY: family history includes Cancer in his maternal grandmother and mother.  SOCIAL HISTORY:  reports that he quit smoking about 4 years ago. He has never used smokeless tobacco. He reports that he does not drink alcohol or use illicit drugs.  REVIEW OF SYSTEMS:  Other than that discussed above is noncontributory.  PHYSICAL EXAMINATION: ECOG PERFORMANCE STATUS: 1 - Symptomatic but completely ambulatory  Blood pressure 138/97, pulse 115, temperature 97.7 F (36.5 C), temperature source Oral, resp. rate 21, weight 159 lb 11.2 oz (72.439 kg).  GENERAL:alert, no distress and comfortable. 4 voice. SKIN: skin color, texture, turgor are normal, no rashes or significant lesions. Diaphoretic. EYES: PERLA; Conjunctiva are pink and non-injected, sclera clear. No sinus tenderness. SINUSES: No redness or tenderness over maxillary or ethmoid sinuses. No evidence of otitis. OROPHARYNX:no exudate, no erythema on lips, buccal mucosa, or tongue. NECK: supple, thyroid normal size, non-tender, without nodularity. No masses. CHEST: Increased AP diameter with light port in place. LYMPH:  no palpable lymphadenopathy in the cervical, axillary or inguinal LUNGS: clear to auscultation and percussion with normal breathing effort HEART: regular rate & rhythm and no murmurs. ABDOMEN:abdomen soft, non-tender  and normal bowel sounds. Colostomy in place with no free fluid wave or shifting dullness. Liver and spleen not  enlarged. MUSCULOSKELETAL:no cyanosis of digits and no clubbing. Range of motion normal.  NEURO: alert & oriented x 3 with fluent speech, no focal motor/sensory deficits. No nystagmus.    LABORATORY DATA: Office Visit on 10/18/2013  Component Date Value Ref Range Status  . WBC 10/18/2013 13.5* 4.0 - 10.5 K/uL Final  . RBC 10/18/2013 4.81  4.22 - 5.81 MIL/uL Final  . Hemoglobin 10/18/2013 15.3  13.0 - 17.0 g/dL Final  . HCT 10/18/2013 45.3  39.0 - 52.0 % Final  . MCV 10/18/2013 94.2  78.0 - 100.0 fL Final  . MCH 10/18/2013 31.8  26.0 - 34.0 pg Final  . MCHC 10/18/2013 33.8  30.0 - 36.0 g/dL Final  . RDW 10/18/2013 16.4* 11.5 - 15.5 % Final  . Platelets 10/18/2013 185  150 - 400 K/uL Final  . Neutrophils Relative % 10/18/2013 88* 43 - 77 % Final  . Neutro Abs 10/18/2013 11.8* 1.7 - 7.7 K/uL Final  . Lymphocytes Relative 10/18/2013 8* 12 - 46 % Final  . Lymphs Abs 10/18/2013 1.1  0.7 - 4.0 K/uL Final  . Monocytes Relative 10/18/2013 4  3 - 12 % Final  . Monocytes Absolute 10/18/2013 0.5  0.1 - 1.0 K/uL Final  . Eosinophils Relative 10/18/2013 1  0 - 5 % Final  . Eosinophils Absolute 10/18/2013 0.1  0.0 - 0.7 K/uL Final  . Basophils Relative 10/18/2013 0  0 - 1 % Final  . Basophils Absolute 10/18/2013 0.0  0.0 - 0.1 K/uL Final  . Sodium 10/18/2013 143  137 - 147 mEq/L Final  . Potassium 10/18/2013 3.3* 3.7 - 5.3 mEq/L Final  . Chloride 10/18/2013 103  96 - 112 mEq/L Final  . CO2 10/18/2013 26  19 - 32 mEq/L Final  . Glucose, Bld 10/18/2013 88  70 - 99 mg/dL Final  . BUN 10/18/2013 19  6 - 23 mg/dL Final  . Creatinine, Ser 10/18/2013 0.94  0.50 - 1.35 mg/dL Final  . Calcium 10/18/2013 8.8  8.4 - 10.5 mg/dL Final  . Total Protein 10/18/2013 6.4  6.0 - 8.3 g/dL Final  . Albumin 10/18/2013 2.7* 3.5 - 5.2 g/dL Final  . AST 10/18/2013 25  0 - 37 U/L Final  . ALT 10/18/2013 30  0 - 53 U/L Final  . Alkaline Phosphatase 10/18/2013 123* 39 - 117 U/L Final  . Total Bilirubin 10/18/2013 0.5   0.3 - 1.2 mg/dL Final  . GFR calc non Af Amer 10/18/2013 >90  >90 mL/min Final  . GFR calc Af Amer 10/18/2013 >90  >90 mL/min Final   Comment: (NOTE)                          The eGFR has been calculated using the CKD EPI equation.                          This calculation has not been validated in all clinical situations.                          eGFR's persistently <90 mL/min signify possible Chronic Kidney                          Disease.  . Anion gap 10/18/2013 14  5 - 15 Final    PATHOLOGY:  K-ras wild-type adenocarcinoma of the rectum  Urinalysis    Component Value Date/Time   COLORURINE YELLOW 03/02/2013 1400   APPEARANCEUR CLEAR 03/02/2013 1400   LABSPEC >1.030* 07/24/2013 0826   PHURINE 5.5 07/24/2013 0826   GLUCOSEU NEGATIVE 07/24/2013 0826   HGBUR NEGATIVE 07/24/2013 0826   BILIRUBINUR NEGATIVE 07/24/2013 0826   KETONESUR NEGATIVE 07/24/2013 0826   PROTEINUR TRACE* 07/24/2013 0826   UROBILINOGEN 0.2 07/24/2013 0826   NITRITE NEGATIVE 07/24/2013 0826   LEUKOCYTESUR NEGATIVE 07/24/2013 0826    RADIOGRAPHIC STUDIES: Last CT scans done in April 2015  ASSESSMENT: #1.Stage IV rectal cancer with liver, abdominal lymph node, lung, and brain metastases, status post SBRT to a singular cerebellar lesion receiving 20 gray on 07/23/2013. S/P 1 cycle of FOLFIRI +Avastin with intolerance secondary to severe diarrhea requiring discontinuation. Leucovorin/5-FU + Avastin x 1 cycles resulting in grade 4 esophagitis requiring discontinuation of all 5-FU based chemotherapies, tolerating stivarga only fairly,  started 40 mg twice a day  on 08/30/2013. Multiplicity of complaints at this time probably related to increased intracranial pressure as well as stivarga toxicity.    PLAN:  #1. DC Tylenol. #2. Increase dexamethasone 8 mg each morning. #3. Oxycodone 5-10 mg every 4 hours to control pain. #4. Combivent inhaler 2 puffs up to every 6 hours as needed for shortness of breath or wheezing. Use prior to  exertion. #5. DC stivarga for remainder of this cycle and reinstitute therapy 2 weeks from 10/20/2013 with plans to take 40 mg twice a day for 2 weeks on and 2 weeks off instead of 3 weeks on one week off. #6. Followup in 2 weeks with CBC. Plan to repeat CT scans end of August 2015   All questions were answered. The patient knows to call the clinic with any problems, questions or concerns. We can certainly see the patient much sooner if necessary.   I spent 40 minutes counseling the patient face to face. The total time spent in the appointment was 55 minutes.    Doroteo Bradford, MD 10/18/2013 12:00 PM  DISCLAIMER:  This note was dictated with voice recognition software.  Similar sounding words can inadvertently be transcribed inaccurately and may not be corrected upon review.

## 2013-10-19 ENCOUNTER — Telehealth: Payer: Self-pay

## 2013-10-19 ENCOUNTER — Ambulatory Visit
Admission: RE | Admit: 2013-10-19 | Discharge: 2013-10-19 | Disposition: A | Discharge status: Home / Self Care | Payer: Medicare Other | Source: Ambulatory Visit | Attending: Radiation Oncology | Admitting: Radiation Oncology

## 2013-10-19 DIAGNOSIS — C7931 Secondary malignant neoplasm of brain: Secondary | ICD-10-CM

## 2013-10-19 DIAGNOSIS — C7949 Secondary malignant neoplasm of other parts of nervous system: Principal | ICD-10-CM

## 2013-10-19 LAB — CEA: CEA: 63.6 ng/mL — ABNORMAL HIGH (ref 0.0–5.0)

## 2013-10-19 MED ORDER — GADOBENATE DIMEGLUMINE 529 MG/ML IV SOLN
15.0000 mL | Freq: Once | INTRAVENOUS | Status: AC | PRN
  Administered 2013-10-19: 15 mL via INTRAVENOUS

## 2013-10-19 NOTE — Telephone Encounter (Signed)
Received report from [facility] in radiology on mri results with increased size of tumor and 2 new mets to right occipital and right frontal lobes.report printed from epic and given to [place].

## 2013-10-22 ENCOUNTER — Ambulatory Visit
Admission: RE | Admit: 2013-10-22 | Discharge: 2013-10-22 | Disposition: A | Discharge status: Home / Self Care | Payer: Medicare Other | Source: Ambulatory Visit | Attending: Radiation Oncology | Admitting: Radiation Oncology

## 2013-10-22 VITALS — BP 150/91 | HR 76 | Temp 98.1°F | Resp 22 | Wt 162.1 lb

## 2013-10-22 DIAGNOSIS — C7931 Secondary malignant neoplasm of brain: Secondary | ICD-10-CM

## 2013-10-22 HISTORY — DX: Personal history of antineoplastic chemotherapy: Z92.21

## 2013-10-22 NOTE — Progress Notes (Signed)
Radiation Oncology         [phone] ________________________________  Name: ISAIAH CIANCI MRN: [id number]  Date: 10/22/2013  DOB: [birth year]  Follow-Up Visit Note  Outpatient  CC: [name], MD  [name], MD  Diagnosis and Prior Radiotherapy:   Right cerebellar Brain Metastasis, Rectal Cancer  Indication for treatment: Palliative  Radiation treatment dates: 07/23/2013  Site/Beams/dose/energy: Right cerebellar target was treated using 4 Dynamic Conformal Arcs to a prescription dose of 20 Gy with SRS. 6 FFF MV photons used  Narrative:  The patient returns today for routine follow-up.  He has stable dizziness and blurred vision.  L temporal HAs persistent but are better than 3 months ago.  No nausea currently.  Last week started Dexamethasone 8 mg daily per med/onc due to suspicion of increased intracranial pressure - not sure if this is helping with his HAs or not.     Per med/onc:  DC stivarga for remainder of this cycle and reinstitute therapy 2 weeks from 10/20/2013 with plans to take 40 mg twice a day for 2 weeks on and 2 weeks off instead of 3 weeks on one week off              MRI results discussed today at tumor board - images reviewed with patient.  ALLERGIES:  is allergic to erbitux.  Meds: Current Outpatient Prescriptions  Medication Sig Dispense Refill  . albuterol (PROVENTIL HFA;VENTOLIN HFA) 108 (90 BASE) MCG/ACT inhaler Inhale 2 puffs into the lungs every 6 (six) hours as needed for wheezing or shortness of breath.  1 Inhaler  12  . amLODipine (NORVASC) 10 MG tablet Take 0.5 tablets (5 mg total) by mouth daily.  30 tablet  6  . aspirin 81 MG tablet Take 81 mg by mouth daily.        . Aspirin-Salicylamide-Caffeine (BC HEADACHE PO) Take 2 packets by mouth 4 (four) times daily as needed (headache).      Marland Kitchen dexamethasone (DECADRON) 4 MG tablet Take 4 mg by mouth 2 (two) times daily.       . Diphenhyd-Hydrocort-Nystatin (FIRST-DUKES MOUTHWASH) SUSP Use as directed 5 mLs in  the mouth or throat 4 (four) times daily as needed.  300 mL  1  . diphenoxylate-atropine (LOMOTIL) 2.5-0.025 MG per tablet Take 2 tablets every 2-4 hours to control diarrhea  120 tablet  0  . fluconazole (DIFLUCAN) 100 MG tablet take 1 tablet by mouth once daily  30 tablet  0  . Ipratropium-Albuterol (COMBIVENT) 20-100 MCG/ACT AERS respimat Inhale 1 puff into the lungs every 6 (six) hours.  4 g  6  . lidocaine (XYLOCAINE) 2 % solution 1 teaspoonful rubbed on gums for discomfort. May mixed with 5 cc of water and gargle as needed and spit out.  100 mL  2  . lisinopril (PRINIVIL,ZESTRIL) 20 MG tablet Take 40 mg by mouth daily.      Marland Kitchen loperamide (IMODIUM) 2 MG capsule Take 2 mg by mouth as needed for diarrhea or loose stools.      Marland Kitchen LORazepam (ATIVAN) 0.5 MG tablet Take 1 tablet (0.5 mg total) by mouth every 8 (eight) hours as needed (nausea/vomiting.  Take PO or SL.).  60 tablet  2  . metoprolol (LOPRESSOR) 50 MG tablet Take 50 mg by mouth 3 (three) times daily.      Marland Kitchen morphine (MS CONTIN) 30 MG 12 hr tablet Take 30 mg by mouth every 12 (twelve) hours.      . multivitamin (THERAGRAN)  per tablet Take 1 tablet by mouth daily.        Marland Kitchen oxyCODONE (OXY IR/ROXICODONE) 5 MG immediate release tablet Take 1 or 2 tablets every 4 hours to control pain  100 tablet  0  . pantoprazole (PROTONIX) 20 MG tablet Take 1 tablet (20 mg total) by mouth daily.  90 tablet  3  . prochlorperazine (COMPAZINE) 10 MG tablet Take 1 tablet (10 mg total) by mouth every 6 (six) hours as needed for nausea, vomiting or refractory nausea / vomiting.  45 tablet  2  . regorafenib (STIVARGA) 40 MG tablet Take 2 tablets daily for 21 days , rest 7 days and repeat taking the medication with low fat meal. Caution: Chemotherapy.  56 tablet  6  . triamterene-hydrochlorothiazide (MAXZIDE-25) 37.5-25 MG per tablet Take 1 tablet by mouth daily.  90 tablet  3   No current facility-administered medications for this encounter.   Facility-Administered  Medications Ordered in Other Encounters  Medication Dose Route Frequency Provider Last Rate Last Dose  . sodium chloride 0.9 % injection 10 mL  10 mL Intravenous PRN Baird Cancer, PA-C   10 mL at 09/19/12 1037  . sucralfate (CARAFATE) 1 GM/10ML suspension 1 g  1 g Oral TID WC & HS Farrel Gobble, MD        Physical Findings: The patient is in no acute distress. Patient is alert and oriented.  weight is 162 lb 1.6 oz (73.528 kg). His oral temperature is 98.1 F (36.7 C). His blood pressure is 150/91 and his pulse is 76. His respiration is 22 and oxygen saturation is 99%. .   No oral thrush.  No neurologic focalities. Ambulatory. Strength intact. coordination intact. Speech intact.   KPS = 80  100 - Normal; no complaints; no evidence of disease. 90   - Able to carry on normal activity; minor signs or symptoms of disease. 80   - Normal activity with effort; some signs or symptoms of disease. 67   - Cares for self; unable to carry on normal activity or to do active work. 60   - Requires occasional assistance, but is able to care for most of his personal needs. 50   - Requires considerable assistance and frequent medical care. 71   - Disabled; requires special care and assistance. 48   - Severely disabled; hospital admission is indicated although death not imminent. 46   - Very sick; hospital admission necessary; active supportive treatment necessary. 10   - Moribund; fatal processes progressing rapidly. 0     - Dead  Karnofsky DA, Abelmann [place], Craver LS and Burchenal [facility] [phone]) The use of the nitrogen mustards in the palliative treatment of carcinoma: with particular reference to bronchogenic carcinoma Cancer 1 [id number]   Lab Findings: Lab Results  Component Value Date   WBC 13.5* 10/18/2013   HGB 15.3 10/18/2013   HCT 45.3 10/18/2013   MCV 94.2 10/18/2013   PLT 185 10/18/2013    Radiographic Findings: Mr Jeri Cos EX Contrast  10/19/2013   CLINICAL DATA:  SRS restaging. Metastatic  rectal cancer. Radiation to right cerebellar lesion 07/23/2013.  EXAM: MRI HEAD WITHOUT AND WITH CONTRAST  TECHNIQUE: Multiplanar, multiecho pulse sequences of the brain and surrounding structures were obtained without and with intravenous contrast.  CONTRAST:  98mL MULTIHANCE GADOBENATE DIMEGLUMINE 529 MG/ML IV SOLN  COMPARISON:  07/16/2013  FINDINGS: There is a punctate focus of apparent restricted diffusion in the right putamen. There is no acute large territory infarct.  Right cerebellar lesion has increased in size, measuring 17 x 14 mm (series 10, image 42, previously 11 x 10 mm), but now only demonstrates peripheral enhancement with new central necrosis. There is minimal surrounding edema, slightly increased from prior. A small amount of intrinsic T1 hyperintensity in the region of the left cerebellopontine angle is unchanged, likely artifactual.  There is a new, 2 mm enhancing lesion in the right occipital lobe (series 10, image 75). There is a new 2 mm enhancing lesion in the right frontal lobe (series 10, image 111). There is no significant edema about these 2 new lesions. Patchy T2 hyperintensities in the subcortical and deep cerebral white matter and pons are nonspecific but may reflect post therapy changes and chronic small vessel ischemic disease. There is mild cerebral atrophy. There is no midline shift or extra-axial fluid collection. There is no acute intracranial hemorrhage.  Orbits are unremarkable. There is mild bilateral frontal and ethmoid sinus mucosal thickening. Mastoid air cells are clear. Narrowing of the distal right vertebral artery is similar to the prior study. Major intracranial vascular flow voids otherwise appear preserved.  IMPRESSION: 1. Mildly increased size of right cerebellar metastasis with new central necrosis, consistent with post radiation change. 2. Two new 2 mm metastases in the right occipital and right frontal lobes. 3. Punctate acute to subacute infarct in the right  putamen. These results will be called to the ordering clinician or representative by the Radiologist Assistant, and communication documented in the PACS or zVision Dashboard.   Electronically Signed   By: Logan Bores   On: 10/19/2013 10:33    Impression/Plan: Treatment effects appreciated in right cerebellar metastasis.  Two new punctate brain metastases.  I had a lengthy discussion with the patient.  We spoke about whole brain radiotherapy versus stereotactic radiosurgery to the brain. We spoke about the differing risks benefits and side effects of both of these treatments. During part of our discussion, we spoke about the cognitive side effects and fatigue that can result from whole brain radiotherapy and we spoke about radionecrosis that can result from stereotactic radiosurgery. I explained that whole brain radiotherapy is more comprehensive and therefore can decrease the chance of recurrences elsewhere in the brain while stereotactic radiosurgery only treats the areas of gross disease while sparing the rest of the brain parenchyma.  After lengthy discussion, the patient would like to proceed with stereotactic radiosurgery to the 2 new brain tumors. We will proceed with simulation in the near future to plan this. Consent has been signed and placed in his chart.  Advised him to divide the Decadron in two doses (4mg  BID) and to take w/ meals. He may be having headaches related to the treatment effects/swelling in the right cerebellar lesion, though it is hard to know for sure.  Since the Decadron hasn't helped him much symptomatically, I will try not to keep him on this for too long.   _____________________________________   Eppie Gibson, MD

## 2013-10-22 NOTE — Progress Notes (Signed)
He is currently in no pain. Pt complains of, Loss of Sleep, Fatigue, Generalized Weakness, Poor Appetite, Pain-Stabbing and Pain Occurs -Intermittently, over left shoulder.  Pt alert & oriented x 3 with fluent speech, raspy voice. Pt reports positive for double vision, floaters both eyes, PERRLA.Pt presenting appropriate quality, quantity and organization of sentences, faint. Pt reports sharp pain, unilateral in the left temporal area. Oral exam reveals a dry tongue with a small amount of pink exudate.   The patient eats a regular, healthy diet. Decadron? Yes.   4mg  bid

## 2013-10-23 ENCOUNTER — Encounter: Payer: Self-pay | Admitting: Radiation Oncology

## 2013-10-24 ENCOUNTER — Ambulatory Visit
Admission: RE | Admit: 2013-10-24 | Discharge: 2013-10-24 | Disposition: A | Discharge status: Home / Self Care | Payer: Medicare Other | Source: Ambulatory Visit | Attending: Radiation Oncology | Admitting: Radiation Oncology

## 2013-10-24 VITALS — BP 113/79 | HR 113 | Temp 98.2°F | Ht 73.0 in | Wt 159.7 lb

## 2013-10-24 DIAGNOSIS — C761 Malignant neoplasm of thorax: Secondary | ICD-10-CM | POA: Insufficient documentation

## 2013-10-24 DIAGNOSIS — Z510 Encounter for antineoplastic radiation therapy: Secondary | ICD-10-CM | POA: Insufficient documentation

## 2013-10-24 DIAGNOSIS — I82C19 Acute embolism and thrombosis of unspecified internal jugular vein: Secondary | ICD-10-CM | POA: Diagnosis not present

## 2013-10-24 DIAGNOSIS — C7949 Secondary malignant neoplasm of other parts of nervous system: Secondary | ICD-10-CM

## 2013-10-24 DIAGNOSIS — Z7901 Long term (current) use of anticoagulants: Secondary | ICD-10-CM | POA: Diagnosis not present

## 2013-10-24 DIAGNOSIS — C7931 Secondary malignant neoplasm of brain: Secondary | ICD-10-CM

## 2013-10-24 DIAGNOSIS — C20 Malignant neoplasm of rectum: Secondary | ICD-10-CM | POA: Insufficient documentation

## 2013-10-24 DIAGNOSIS — Z7982 Long term (current) use of aspirin: Secondary | ICD-10-CM | POA: Diagnosis not present

## 2013-10-24 MED ORDER — SODIUM CHLORIDE 0.9 % IJ SOLN
10.0000 mL | Freq: Once | INTRAMUSCULAR | Status: AC
  Administered 2013-10-24: 10 mL via INTRAVENOUS

## 2013-10-24 NOTE — Progress Notes (Addendum)
Nonie Hoyer here for IV start.  He denies having issues or allergies to IV contrast dye.   He has a right port-a-cath but he is not sure if it is a powerport.  Three bumps could not be palpated.  Started peripheral #22 guage IV in left ac.  Blood return noted.  Flushed with 10 cc normal saline.  Secured with tegaderm and tape.  Patient was escorted to CT SIM by Patric Dykes, RN.

## 2013-10-24 NOTE — Progress Notes (Signed)
  Radiation Oncology         [phone] ________________________________  Name: Martin Maldonado MRN: [id number]  Date: 10/24/2013  DOB: 03-29-1955  SIMULATION AND TREATMENT PLANNING NOTE  DIAGNOSIS:  Brain Metastases, Rectal Cancer   NARRATIVE:  The patient was brought to the [facility] suite.  Identity was confirmed.  All relevant records and images related to the planned course of therapy were reviewed.  The patient freely provided informed written consent to proceed with treatment after reviewing the details related to the planned course of therapy. The consent form was witnessed and verified by the simulation staff. Intravenous access was established for contrast administration. Then, the patient was set-up in a stable reproducible supine position for radiation therapy.  A relocatable thermoplastic stereotactic head frame was fabricated for precise immobilization.  CT images were obtained.  Surface markings were placed.  The CT images were loaded into the planning software and fused with the patient's targeting MRI scan.  Then the target and avoidance structures were contoured.  Treatment planning then occurred.  The radiation prescription was entered and confirmed.  I have requested 3D planning  I have requested a DVH of the following structures: Brain stem, brain, left eye, right eye, lenses, optic chiasm, target volumes, uninvolved brain, and normal tissue.    PLAN:  The patient will receive 20 Gy in 1 fraction to the two new 2 mm metastases in the right occipital and right frontal lobes with SRS technique   ________________________________   Eppie Gibson, MD

## 2013-10-26 DIAGNOSIS — Z510 Encounter for antineoplastic radiation therapy: Secondary | ICD-10-CM | POA: Diagnosis not present

## 2013-10-29 ENCOUNTER — Ambulatory Visit: Payer: Medicare Other | Admitting: Radiation Oncology

## 2013-10-29 DIAGNOSIS — Z510 Encounter for antineoplastic radiation therapy: Secondary | ICD-10-CM | POA: Diagnosis not present

## 2013-10-30 ENCOUNTER — Other Ambulatory Visit: Payer: Self-pay | Admitting: Radiation Therapy

## 2013-10-30 ENCOUNTER — Ambulatory Visit
Admission: RE | Admit: 2013-10-30 | Discharge: 2013-10-30 | Disposition: A | Discharge status: Home / Self Care | Payer: Medicare Other | Source: Ambulatory Visit | Attending: Radiation Oncology | Admitting: Radiation Oncology

## 2013-10-30 VITALS — BP 140/88 | HR 98 | Temp 98.2°F | Resp 16

## 2013-10-30 DIAGNOSIS — C7931 Secondary malignant neoplasm of brain: Secondary | ICD-10-CM

## 2013-10-30 DIAGNOSIS — C20 Malignant neoplasm of rectum: Secondary | ICD-10-CM

## 2013-10-30 DIAGNOSIS — Z510 Encounter for antineoplastic radiation therapy: Secondary | ICD-10-CM | POA: Diagnosis not present

## 2013-10-30 DIAGNOSIS — C7801 Secondary malignant neoplasm of right lung: Secondary | ICD-10-CM

## 2013-10-30 DIAGNOSIS — C7802 Secondary malignant neoplasm of left lung: Principal | ICD-10-CM

## 2013-10-30 NOTE — Op Note (Signed)
  Name: CAMDEN KNOTEK  MRN: [id number]  Date: 10/30/2013   DOB: Jun 13, 1955  Stereotactic Radiosurgery Operative Note  PRE-OPERATIVE DIAGNOSIS:  Multiple Brain Metastases  POST-OPERATIVE DIAGNOSIS:  Multiple Brain Metastases  PROCEDURE:  Stereotactic Radiosurgery  SURGEON:  Charlie Pitter, MD  NARRATIVE: The patient underwent a radiation treatment planning session in the radiation oncology simulation suite under the care of the radiation oncology physician and physicist.  I participated closely in the radiation treatment planning afterwards. The patient underwent planning CT which was fused to 3T high resolution MRI with 1 mm axial slices.  These images were fused on the planning system.  We contoured the gross target volumes and subsequently expanded this to yield the Planning Target Volume. I actively participated in the planning process.  I helped to define and review the target contours and also the contours of the optic pathway, eyes, brainstem and selected nearby organs at risk.  All the dose constraints for critical structures were reviewed and compared to AAPM Task Group 101.  The prescription dose conformity was reviewed.  I approved the plan electronically.    Accordingly, [name] was brought to the TrueBeam stereotactic radiation treatment linac and placed in the custom immobilization mask.  The patient was aligned according to the IR fiducial markers with BrainLab Exactrac, then orthogonal x-rays were used in ExacTrac with the 6DOF robotic table and the shifts were made to align the patient  [name] received stereotactic radiosurgery uneventfully.    Lesions treated:  2   Complex lesions treated:  0 (>3.5 cm, <52mm of optic path, or within the brainstem)   The detailed description of the procedure is recorded in the radiation oncology procedure note.  I was present for the duration of the procedure.  DISPOSITION:  Following delivery, the patient was transported to nursing in  stable condition and monitored for possible acute effects to be discharged to home in stable condition with follow-up in one month.  Charlie Pitter, MD 10/30/2013 3:44 PM

## 2013-10-30 NOTE — Progress Notes (Signed)
  Radiation Oncology         [phone] ________________________________  Stereotactic Treatment Procedure Note  Name: Martin Maldonado MRN: [id number]  Date: 10/30/2013  DOB: 02-20-1956  SPECIAL TREATMENT PROCEDURE Outpatient Brain Metastases  3D TREATMENT PLANNING AND DOSIMETRY:  The patient's radiation plan was reviewed and approved by neurosurgery and radiation oncology prior to treatment.  It showed 3-dimensional radiation distributions overlaid onto the planning CT/MRI image set.  The [facility] for the target structures as well as the organs at risk were reviewed. The documentation of the 3D plan and dosimetry are filed in the radiation oncology EMR.  NARRATIVE:  Martin Maldonado was brought to the TrueBeam stereotactic radiation treatment machine and placed supine on the CT couch. The head frame was applied, and the patient was set up for stereotactic radiosurgery.  Neurosurgery was present for the set-up and delivery  SIMULATION VERIFICATION:  In the couch zero-angle position, the patient underwent Exactrac imaging using the Brainlab system with orthogonal KV images.  These were carefully aligned and repeated to confirm treatment position for each of the isocenters.  The Exactrac snap film verification was repeated at each couch angle.  SPECIAL TREATMENT PROCEDURE: Martin Maldonado received stereotactic radiosurgery to the following targets: Right frontal 11mm target was treated using 3 Circular Arcs to a prescription dose of 20 Gy.  ExacTrac Snap verification was performed for each couch angle. Right occipital 38mm target was treated using 3 Circular Arcs to a prescription dose of 20 Gy.  ExacTrac Snap verification was performed for each couch angle.   This constitutes a special treatment procedure due to the ablative dose delivered and the technical nature of treatment.  This highly technical modality of treatment ensures that the ablative dose is centered on the patient's tumor while sparing normal  tissues from excessive dose and risk of detrimental effects.  STEREOTACTIC TREATMENT MANAGEMENT:  Following delivery, the patient was transported to nursing in stable condition and monitored for possible acute effects.  Vital signs were recorded BP 140/88  Pulse 98  Temp(Src) 98.2 F (36.8 C) (Oral)  Resp 16  SpO2 100%. The patient tolerated treatment without significant acute effects, and was discharged to home in stable condition.    PLAN: Moving up CT of chest to tomorrow AM as he is SOB and chest is cyanotic when he lies down.  Suspect progression of tumor, want to r/o SVC compression.  Follow-up in one month, sooner if needed.  ________________________________   Eppie Gibson, MD

## 2013-10-30 NOTE — Progress Notes (Signed)
Patient seen and release by Dr. Isidore Moos. Vitals WDL. Patient denies pain. Patient denies headache, dizziness, nausea, or diplopia. Patient understands to contact staff immediately with needs. Patient understands to rest for the next 24 hours. Patient wheeled out in wheelchair by son.

## 2013-10-30 NOTE — Progress Notes (Signed)
Received patient in exam 1 following SRS treatment. Patient accompanied by several family members. Flat affect noted. Denies headache, dizziness, diplopia or nausea. Reports taking decadron 4 mg bid. Will monitor patient until 5:25 pm. Call bell within reach and patient understands to call with needs.

## 2013-10-31 ENCOUNTER — Encounter (HOSPITAL_COMMUNITY): Payer: Self-pay

## 2013-10-31 ENCOUNTER — Encounter (HOSPITAL_COMMUNITY): Payer: Medicare Other | Attending: Hematology and Oncology

## 2013-10-31 ENCOUNTER — Encounter (HOSPITAL_BASED_OUTPATIENT_CLINIC_OR_DEPARTMENT_OTHER): Payer: Medicare Other

## 2013-10-31 ENCOUNTER — Ambulatory Visit
Admission: RE | Admit: 2013-10-31 | Discharge: 2013-10-31 | Disposition: A | Discharge status: Home / Self Care | Payer: Medicare Other | Source: Ambulatory Visit | Attending: Radiation Oncology | Admitting: Radiation Oncology

## 2013-10-31 ENCOUNTER — Encounter: Payer: Self-pay | Admitting: Radiation Oncology

## 2013-10-31 ENCOUNTER — Ambulatory Visit (HOSPITAL_COMMUNITY)
Admission: RE | Admit: 2013-10-31 | Discharge: 2013-10-31 | Disposition: A | Discharge status: Home / Self Care | Payer: Medicare Other | Source: Ambulatory Visit | Attending: Radiation Oncology | Admitting: Radiation Oncology

## 2013-10-31 VITALS — BP 144/100 | HR 98 | Temp 98.0°F | Resp 20

## 2013-10-31 DIAGNOSIS — I82C19 Acute embolism and thrombosis of unspecified internal jugular vein: Secondary | ICD-10-CM

## 2013-10-31 DIAGNOSIS — C7800 Secondary malignant neoplasm of unspecified lung: Secondary | ICD-10-CM | POA: Insufficient documentation

## 2013-10-31 DIAGNOSIS — C761 Malignant neoplasm of thorax: Secondary | ICD-10-CM

## 2013-10-31 DIAGNOSIS — I749 Embolism and thrombosis of unspecified artery: Secondary | ICD-10-CM | POA: Diagnosis present

## 2013-10-31 DIAGNOSIS — C189 Malignant neoplasm of colon, unspecified: Secondary | ICD-10-CM | POA: Diagnosis not present

## 2013-10-31 DIAGNOSIS — R5383 Other fatigue: Secondary | ICD-10-CM | POA: Diagnosis not present

## 2013-10-31 DIAGNOSIS — C20 Malignant neoplasm of rectum: Secondary | ICD-10-CM

## 2013-10-31 DIAGNOSIS — C787 Secondary malignant neoplasm of liver and intrahepatic bile duct: Secondary | ICD-10-CM

## 2013-10-31 DIAGNOSIS — R5381 Other malaise: Secondary | ICD-10-CM | POA: Insufficient documentation

## 2013-10-31 DIAGNOSIS — C7802 Secondary malignant neoplasm of left lung: Secondary | ICD-10-CM

## 2013-10-31 DIAGNOSIS — K8020 Calculus of gallbladder without cholecystitis without obstruction: Secondary | ICD-10-CM | POA: Insufficient documentation

## 2013-10-31 DIAGNOSIS — C7931 Secondary malignant neoplasm of brain: Secondary | ICD-10-CM

## 2013-10-31 DIAGNOSIS — I82C12 Acute embolism and thrombosis of left internal jugular vein: Secondary | ICD-10-CM

## 2013-10-31 DIAGNOSIS — Z510 Encounter for antineoplastic radiation therapy: Secondary | ICD-10-CM | POA: Diagnosis not present

## 2013-10-31 DIAGNOSIS — I8290 Acute embolism and thrombosis of unspecified vein: Secondary | ICD-10-CM

## 2013-10-31 DIAGNOSIS — R0602 Shortness of breath: Secondary | ICD-10-CM | POA: Insufficient documentation

## 2013-10-31 DIAGNOSIS — J90 Pleural effusion, not elsewhere classified: Secondary | ICD-10-CM | POA: Insufficient documentation

## 2013-10-31 DIAGNOSIS — C7801 Secondary malignant neoplasm of right lung: Secondary | ICD-10-CM

## 2013-10-31 LAB — PROTIME-INR
INR: 1.11 (ref 0.00–1.49)
PROTHROMBIN TIME: 14.3 s (ref 11.6–15.2)

## 2013-10-31 LAB — APTT: APTT: 30 s (ref 24–37)

## 2013-10-31 MED ORDER — DEXAMETHASONE 4 MG PO TABS: 4.0000 mg | ORAL_TABLET | Freq: Every day | ORAL | Status: DC

## 2013-10-31 MED ORDER — ENOXAPARIN SODIUM 100 MG/ML ~~LOC~~ SOLN: 100.0000 mg | Freq: Once | SUBCUTANEOUS | Status: DC

## 2013-10-31 MED ORDER — ENOXAPARIN SODIUM 100 MG/ML ~~LOC~~ SOLN
100.0000 mg | Freq: Once | SUBCUTANEOUS | Status: AC
Start: 2013-10-31 — End: 2013-10-31
  Administered 2013-10-31: 100 mg via SUBCUTANEOUS
  Filled 2013-10-31: qty 1

## 2013-10-31 MED ORDER — IOHEXOL 300 MG/ML  SOLN
80.0000 mL | Freq: Once | INTRAMUSCULAR | Status: AC | PRN
  Administered 2013-10-31: 80 mL via INTRAVENOUS

## 2013-10-31 MED ORDER — PROCHLORPERAZINE EDISYLATE 5 MG/ML IJ SOLN
10.0000 mg | Freq: Four times a day (QID) | INTRAMUSCULAR | Status: DC | PRN
  Filled 2013-10-31: qty 2

## 2013-10-31 MED ORDER — SODIUM CHLORIDE 0.9 % IV SOLN
INTRAVENOUS | Status: DC
  Administered 2013-10-31: 11:00:00 via INTRAVENOUS

## 2013-10-31 MED ORDER — RIVAROXABAN 15 MG PO TABS: 15.0000 mg | ORAL_TABLET | Freq: Two times a day (BID) | ORAL | Status: AC

## 2013-10-31 NOTE — Progress Notes (Signed)
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 Colon Cancer  . Mediastinal and lung metastases  . Jugular vein thrombosis    CURRENT THERAPY: SBRT 2 brain lesions, last given on 10/30/2013 at the time 2 new lesions were found at the completion of therapy to previously known lesions. Stivarga 2 weeks on and 2 weeks off.   INTERVAL HISTORY: Martin Maldonado 58 y.o. male returns for followup as a walk-in because of episode of cyanosis while lying down yesterday receiving SBRT 2 brain metastases from rectal cancer. CT scan was ordered and revealed evidence of bulky mediastinal adenopathy along with internal jugular vein thrombosis. He is having difficulty in swallowing as well as experiencing more hoarseness. He has episodes of cyanosis. He denies a worsening cough, abdominal pain and is not able to take in liquids or fluids. Upon arrival here today he was started on IV fluids and consultation was obtained with Dr. Isidore Moos who has made an appointment for him to be seen at 12:30 at [facility] for simulation and initiation of radiotherapy to the chest for palliation.   MEDICAL HISTORY: Past Medical History  Diagnosis Date  . Hypertension   . Rectal cancer 2008    Adenocarcinoma; AP resection in 2008; recurrence with lung metastases in 2011  . Arteriosclerotic cardiovascular disease (ASCVD) 2011    CABG surgery in 2011  . Hyperlipidemia   . 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 Splenomegaly 01/23/2009    Additional GI history includes small bowel obstruction, requiring laparotomy  and lysis of adhesions in 2009, remote peptic ulcer disease and postoperative perirectal infection  . ALCOHOL ABUSE, HX OF 01/23/2009  . Brain metastasis 07/02/2013  . Status post chemotherapy     Leucovorin/5-FU plus Avastin-discontinued due to Grade 4 Esohagitis  . S/P radiation therapy  07/23/2013    Right cerebellar  . On antineoplastic chemotherapy     Stivarga 80 mg 21 days on and 7 day respite      INTERIM HISTORY: has HYPERLIPIDEMIA; Tobacco abuse, in remission; HYPERTENSION; COPD; SPLENOMEGALY; SMALL BOWEL OBSTRUCTION, HX OF; Rectal cancer; CAD (coronary artery disease) of artery bypass graft; and Brain metastasis on his problem list.   Rectal cancer  05/09/2006  Surgery  Rectum, abdomino-perineal resection demonstrating invasive adenocarcinoma of rectum, moderately differentiated, with invasion into the perirectal adipose tissue with positive margin in the perirectal soft tissue. 1/12 lymph nodes positive for disease  06/14/2006 - 09/28/2006  Chemotherapy  Oxaliplatin, Xeloda, and Avastin x 6 cycles  10/12/2006 - 10/26/2006  Chemotherapy  Avastin  06/10/2009  Cancer Staging  Lung biopsy positive for adenocarcinoma of colorectal origin  06/30/2009 - 01/06/2010  Chemotherapy  FOLFOX + Avastin x 13 cycles  08/10/2010 - 12/09/2010  Chemotherapy  FOLFIRI + Avastin x 8 cycles  05/04/2011 - 10/12/2011  Chemotherapy  Cetuximab + 5FU/Leucovorin x 12 cycles  01/24/2012 - 07/25/2012  Chemotherapy  Cetuximab + 5FU/Leucovorin x 13 cycles  10/31/2012  Adverse Reaction  Anaphylaxis to Cetuximab  10/31/2012 - 10/31/2012  Chemotherapy  Cetuximab  11/14/2012 - 12/12/2012  Chemotherapy  5FU/Leucovorin + Avastin  07/02/2013  Imaging  CT CAP and head- progression of disease with brain met. See brain met oncology history for details.  07/03/2013 - 08/06/2013  Chemotherapy  FOLFIRI + Avastin. Discontinued following cycle 1 due to severe diarrhea  07/23/2013 - 07/23/2013  Radiation Therapy  Stereotactic 20Gy  to right cerebellar tumor, Dr. Basilio Cairo  08/07/2013 -  Chemotherapy  Leucovorin/5-FU plus Avastin x1 cycle with severe grade 4 esophagitis causing discontinuation  Chemotherapy j Stivarga 80 mg 21 days on and 7 day respite  Brain metastasis  07/02/2013  Initial Diagnosis  CT head- Mass with surrounding vasogenic edema measuring 1.5 x 1.5 cm in the lateral right cerebellum. This mass shows irregular rim enhancement.  07/19/2013  Imaging  MRI brain- 1.2 x 1 x 0.9 cm hemorrhagic enhancing lesion lateral aspect of the right cerebellum may represent metastatic lesion  07/23/2013 - 07/23/2013     ALLERGIES:  is allergic to erbitux.  MEDICATIONS: has a current medication list which includes the following prescription(s): albuterol, amlodipine, aspirin, aspirin-salicylamide-caffeine, dexamethasone, first-dukes mouthwash, diphenoxylate-atropine, fluconazole, ipratropium-albuterol, lidocaine, lisinopril, loperamide, lorazepam, metoprolol, morphine, multivitamin, oxycodone, pantoprazole, prochlorperazine, regorafenib, and triamterene-hydrochlorothiazide, and the following Facility-Administered Medications: sodium chloride, enoxaparin, sodium chloride, and sucralfate.  SURGICAL HISTORY:  Past Surgical History  Procedure Laterality Date  . Irrigation and debridement sebaceous cyst    . Laparoscopic lysis intestinal adhesions  2009    With incidental appendectomy  . Coronary artery bypass graft  2011    2 vessel  . Abdominoperineal proctocolectomy  2008  . Portacath placement  2011  . Colostomy      FAMILY HISTORY: family history includes Cancer in his maternal grandmother and mother.  SOCIAL HISTORY:  reports that he quit smoking about 4 years ago. He has never used smokeless tobacco. He reports that he does not drink alcohol or use illicit drugs.  REVIEW OF SYSTEMS:  Other than that discussed above is noncontributory.  PHYSICAL EXAMINATION: ECOG PERFORMANCE STATUS: 3 - Symptomatic, >50% confined  to bed  Blood pressure 144/100, pulse 98, temperature 98 F (36.7 C), temperature source Oral, resp. rate 20, weight 0 lb (0 kg), SpO2 96.00%.  GENERAL:alert, no distress and comfortable SKIN:  texture, turgor are normal, no rashes or significant lesions. Min cyanosis.  EYES: PERLA; Conjunctiva are pink and non-injected, sclera clear SINUSES: No redness or tenderness over maxillary or ethmoid sinuses OROPHARYNX:no exudate, no erythema on lips, buccal mucosa, or tongue. NECK: supple, thyroid normal size, non-tender, without nodularity. No masses. Positive venous distention and 45.  CHEST: Increased AP diameter with prominent chest veins.  LYMPH:  no palpable lymphadenopathy in the cervical, axillary or inguinal LUNGS: clear to auscultation and percussion with normal breathing effort HEART: regular rate & rhythm and no murmurs. ABDOMEN:abdomen soft, non-tender and normal bowel sounds. Colostomy in place with no evidence of purulence or bleeding.  MUSCULOSKELETAL:no cyanosis of digits and no clubbing. Range of motion normal.  NEURO: alert & oriented x 3 with fluent speech, no focal motor/sensory deficits   LABORATORY DATA: Infusion on 10/31/2013  Component Date Value Ref Range Status  . Prothrombin Time 10/31/2013 14.3  11.6 - 15.2 seconds Final  . INR 10/31/2013 1.11  0.00 - 1.49 Final  . aPTT 10/31/2013 30  24 - 37 seconds Final  Office Visit on 10/18/2013  Component Date Value Ref Range Status  . WBC 10/18/2013 13.5* 4.0 - 10.5 K/uL Final  . RBC 10/18/2013 4.81  4.22 - 5.81 MIL/uL Final  . Hemoglobin 10/18/2013 15.3  13.0 -  17.0 g/dL Final  . HCT 10/18/2013 45.3  39.0 - 52.0 % Final  . MCV 10/18/2013 94.2  78.0 - 100.0 fL Final  . MCH 10/18/2013 31.8  26.0 - 34.0 pg Final  . MCHC 10/18/2013 33.8  30.0 - 36.0 g/dL Final  . RDW 10/18/2013 16.4* 11.5 - 15.5 % Final  . Platelets 10/18/2013 185  150 - 400 K/uL Final  . Neutrophils Relative % 10/18/2013 88* 43 - 77 % Final  . Neutro  Abs 10/18/2013 11.8* 1.7 - 7.7 K/uL Final  . Lymphocytes Relative 10/18/2013 8* 12 - 46 % Final  . Lymphs Abs 10/18/2013 1.1  0.7 - 4.0 K/uL Final  . Monocytes Relative 10/18/2013 4  3 - 12 % Final  . Monocytes Absolute 10/18/2013 0.5  0.1 - 1.0 K/uL Final  . Eosinophils Relative 10/18/2013 1  0 - 5 % Final  . Eosinophils Absolute 10/18/2013 0.1  0.0 - 0.7 K/uL Final  . Basophils Relative 10/18/2013 0  0 - 1 % Final  . Basophils Absolute 10/18/2013 0.0  0.0 - 0.1 K/uL Final  . Sodium 10/18/2013 143  137 - 147 mEq/L Final  . Potassium 10/18/2013 3.3* 3.7 - 5.3 mEq/L Final  . Chloride 10/18/2013 103  96 - 112 mEq/L Final  . CO2 10/18/2013 26  19 - 32 mEq/L Final  . Glucose, Bld 10/18/2013 88  70 - 99 mg/dL Final  . BUN 10/18/2013 19  6 - 23 mg/dL Final  . Creatinine, Ser 10/18/2013 0.94  0.50 - 1.35 mg/dL Final  . Calcium 10/18/2013 8.8  8.4 - 10.5 mg/dL Final  . Total Protein 10/18/2013 6.4  6.0 - 8.3 g/dL Final  . Albumin 10/18/2013 2.7* 3.5 - 5.2 g/dL Final  . AST 10/18/2013 25  0 - 37 U/L Final  . ALT 10/18/2013 30  0 - 53 U/L Final  . Alkaline Phosphatase 10/18/2013 123* 39 - 117 U/L Final  . Total Bilirubin 10/18/2013 0.5  0.3 - 1.2 mg/dL Final  . GFR calc non Af Amer 10/18/2013 >90  >90 mL/min Final  . GFR calc Af Amer 10/18/2013 >90  >90 mL/min Final   Comment: (NOTE)                          The eGFR has been calculated using the CKD EPI equation.                          This calculation has not been validated in all clinical situations.                          eGFR's persistently <90 mL/min signify possible Chronic Kidney                          Disease.  . Anion gap 10/18/2013 14  5 - 15 Final  . CEA 10/18/2013 63.6* 0.0 - 5.0 ng/mL Final   Performed at [facility]  . TSH 10/18/2013 0.897  0.350 - 4.500 uIU/mL Final   Performed at [facility]: No new pathology. Tumor is K-ras wild-type.   Urinalysis    Component Value Date/Time    COLORURINE YELLOW 03/02/2013 1400   APPEARANCEUR CLEAR 03/02/2013 1400   LABSPEC >1.030* 07/24/2013 0826   PHURINE 5.5 07/24/2013 0826   GLUCOSEU NEGATIVE 07/24/2013 0938  HGBUR NEGATIVE 07/24/2013 0826   BILIRUBINUR NEGATIVE 07/24/2013 0826   KETONESUR NEGATIVE 07/24/2013 0826   PROTEINUR TRACE* 07/24/2013 0826   UROBILINOGEN 0.2 07/24/2013 0826   NITRITE NEGATIVE 07/24/2013 0826   LEUKOCYTESUR NEGATIVE 07/24/2013 0826    RADIOGRAPHIC STUDIES: Ct Chest W Contrast  10/31/2013   CLINICAL DATA:  Shortness of breath and weakness. Colon cancer. Question SVC syndrome. Chest and neck turns purple when patient lays down.  EXAM: CT CHEST WITH CONTRAST  TECHNIQUE: Multidetector CT imaging of the chest was performed during intravenous contrast administration.  CONTRAST:  77mL OMNIPAQUE IOHEXOL 300 MG/ML  SOLN  COMPARISON:  07/02/2013.  FINDINGS: Well-circumscribed low attenuation is seen in the right internal jugular vein. Low SVC appears narrowed (series 2, image 25).  Left supraclavicular adenopathy measures up to 1.9 cm in short axis, stable. There are new left supraclavicular lymph nodes as well. Mediastinal adenopathy measures up to 3.1 cm in the lower right paratracheal station (previously 1.9 cm). Bulky bi hilar adenopathy measures up to 3.0 cm on the right (previously 2.4 cm). Associated narrowing of the left main pulmonary artery and left upper lobe bronchus. Bulky subcarinal adenopathy and enlarged lymph nodes along the course of the descending thoracic aorta are seen as well. Heart size normal. No pericardial effusion.  There are new and enlarging nodules and masses in the lungs bilaterally. Index mass in the right lower lobe measures 4.2 x 5.9 cm (previously 4.0 x 5.3 cm). Moderate to large left pleural effusion is stable. Associated pleural implants are again noted. Airway is otherwise unremarkable.  Incidental imaging of the upper abdomen shows new and enlarging lesions in the liver. Index left hepatic lobe lesion  measures 2.2 cm (previously 1.0 cm). Small stone is seen in the gallbladder. Right adrenal gland is unremarkable. Left adrenal nodule measures 1.2 x 1.3 cm, new. Visualized portions of the kidneys and spleen are unremarkable. New intermediate density lesions are seen along the surface of the pancreas, measuring up to 1.6 x 3.9 cm. Visualized portions of the stomach and bowel are grossly unremarkable. Upper abdominal lymph nodes measure up to 11 mm in the gastrohepatic ligament. No definite worrisome lytic or sclerotic lesions.  IMPRESSION: 1. Well-circumscribed low attenuation within the right internal jugular vein is highly suspicious for thrombus. Critical Value/emergent results were called by telephone at the time of interpretation on 10/31/2013 at 10:22 am to [facility], RN , who verbally acknowledged these results. 2. Marked progression of metastatic disease as evidenced by enlarging bulky left supraclavicular/mediastinal/bi hilar adenopathy, enlarging/new pulmonary parenchymal metastases, enlarging hepatic metastases, new left adrenal metastasis and new pancreatic metastases. 3. Moderate to large left pleural effusion with pleural implants, as before. 4. Marked narrowing of the left main pulmonary artery and left upper lobe bronchus due to extrinsic compression by bulky adenopathy. 5. Cholelithiasis.   Electronically Signed   By: Lorin Picket M.D.   On: 10/31/2013 10:22   Mr Jeri Cos AO Contrast  10/19/2013   CLINICAL DATA:  SRS restaging. Metastatic rectal cancer. Radiation to right cerebellar lesion 07/23/2013.  EXAM: MRI HEAD WITHOUT AND WITH CONTRAST  TECHNIQUE: Multiplanar, multiecho pulse sequences of the brain and surrounding structures were obtained without and with intravenous contrast.  CONTRAST:  22mL MULTIHANCE GADOBENATE DIMEGLUMINE 529 MG/ML IV SOLN  COMPARISON:  07/16/2013  FINDINGS: There is a punctate focus of apparent restricted diffusion in the right putamen. There is no acute large  territory infarct.  Right cerebellar lesion has increased in size, measuring 17  x 14 mm (series 10, image 42, previously 11 x 10 mm), but now only demonstrates peripheral enhancement with new central necrosis. There is minimal surrounding edema, slightly increased from prior. A small amount of intrinsic T1 hyperintensity in the region of the left cerebellopontine angle is unchanged, likely artifactual.  There is a new, 2 mm enhancing lesion in the right occipital lobe (series 10, image 75). There is a new 2 mm enhancing lesion in the right frontal lobe (series 10, image 111). There is no significant edema about these 2 new lesions. Patchy T2 hyperintensities in the subcortical and deep cerebral white matter and pons are nonspecific but may reflect post therapy changes and chronic small vessel ischemic disease. There is mild cerebral atrophy. There is no midline shift or extra-axial fluid collection. There is no acute intracranial hemorrhage.  Orbits are unremarkable. There is mild bilateral frontal and ethmoid sinus mucosal thickening. Mastoid air cells are clear. Narrowing of the distal right vertebral artery is similar to the prior study. Major intracranial vascular flow voids otherwise appear preserved.  IMPRESSION: 1. Mildly increased size of right cerebellar metastasis with new central necrosis, consistent with post radiation change. 2. Two new 2 mm metastases in the right occipital and right frontal lobes. 3. Punctate acute to subacute infarct in the right putamen. These results will be called to the ordering clinician or representative by the Radiologist Assistant, and communication documented in the PACS or zVision Dashboard.   Electronically Signed   By: Logan Bores   On: 10/19/2013 10:33    ASSESSMENT:  #1.Stage IV rectal cancer with liver, abdominal lymph node, lung, and brain metastases, status post SBRT to a singular cerebellar lesion receiving 20 gray on 07/23/2013. S/P 1 cycle of FOLFIRI +Avastin  with intolerance secondary to severe diarrhea requiring discontinuation. Leucovorin/5-FU + Avastin x 1 cycles resulting in grade 4 esophagitis requiring discontinuation of all 5-FU based chemotherapies, tolerating stivarga only fairly, started 40 mg twice a day on 08/30/2013, now with progression and development of left internal jugular vein thrombosis with extensive mediastinal adenopathy. #2. Chronic obstructive pulmonary disease   PLAN:  #1. Transfer to  [facility] for CT simulation and initiation of radiotherapy to the mediastinum for palliation. #2. Lovenox 100 mg subcutaneous. Xarelto 15 mg twice a day for 21 days. #3. Would probably benefit from admission to [facility] for continuation of chemotherapy in the short run. #4. Overall prognosis guarded.   All questions were answered. The patient knows to call the clinic with any problems, questions or concerns. We can certainly see the patient much sooner if necessary.   I spent 30 minutes counseling the patient face to face. The total time spent in the appointment was 40 minutes.    Doroteo Bradford, MD 10/31/2013 11:34 AM  DISCLAIMER:  This note was dictated with voice recognition software.  Similar sounding words can inadvertently be transcribed inaccurately and may not be corrected upon review.

## 2013-10-31 NOTE — Progress Notes (Signed)
  Radiation Oncology         (336) [phone] ________________________________  Name: Martin Maldonado MRN: [id number]  Date: 10/31/2013  DOB: 1955/07/22  SIMULATION AND TREATMENT PLANNING NOTE  Outpatient  DIAGNOSIS:  Lung metastases   ICD-9-CM  1. 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  Identity was confirmed.  All relevant records and images related to the planned course of therapy were reviewed.  The patient freely provided informed written consent to proceed with treatment after reviewing the details related to the planned course of therapy. The consent form was witnessed and verified by the simulation staff.    Then, the patient was set-up in a stable reproducible  supine position for radiation therapy.  CT images were obtained.  Surface markings were placed.  The CT images were loaded into the planning software.    TREATMENT PLANNING NOTE: Treatment planning then occurred.  The radiation prescription was entered and confirmed.    A total of 2 medically necessary complex treatment devices were fabricated and supervised by me: AP/PA fields with MLCs to block lungs, heart. I have requested : 3D Simulation  I have requested a DVH of the following structures: GTV, cord, lungs, heart, esophagus.    The patient will receive 20 Gy in 5 fractions to bulky disease in his lungs and hilar/mediastinal regions. AP/PA fields.   -----------------------------------  Eppie Gibson, MD

## 2013-10-31 NOTE — Progress Notes (Signed)
See  infusion room note of 10/31/2013

## 2013-10-31 NOTE — Progress Notes (Signed)
PATIENT TAKEN TO 4TH FLOOR WITH IV IN, SHANE SPOKE TO HALEY

## 2013-10-31 NOTE — Progress Notes (Signed)
Radiation Oncology         (336) [phone] ________________________________  Name: [name] MRN: [id number]  Date: 10/31/2013  DOB: [birth year]  Follow-Up Visit Note  Outpatient  CC: SHAH,ASHISH, MD  No ref. 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  After SRS to brain yesterday, I ordered urgent CT of chest due to cyanosis of chest and SOB.  Pulse ox was 100% however. CT scan of chest shows marked progression.  Dr. Barnet Glasgow asked if we could work him in for urgent RT planning.  Patient reports he will not receive more chemotherapy. He also has a thrombus in the right internal jugular and received lovenox with Rx for Xarelto. He is very tired, weak, and still SOB.                              ALLERGIES:  is allergic to erbitux.  Meds: Current Outpatient Prescriptions  Medication Sig Dispense Refill  . albuterol (PROVENTIL HFA;VENTOLIN HFA) 108 (90 BASE) MCG/ACT inhaler Inhale 2 puffs into the lungs every 6 (six) hours as needed for wheezing or shortness of breath.  1 Inhaler  12  . amLODipine (NORVASC) 10 MG tablet Take 0.5 tablets (5 mg total) by mouth daily.  30 tablet  6  . aspirin 81 MG tablet Take 81 mg by mouth daily.        . Aspirin-Salicylamide-Caffeine (BC HEADACHE PO) Take 2 packets by mouth 4 (four) times daily as needed (headache).      Marland Kitchen dexamethasone (DECADRON) 4 MG tablet Take 4 mg by mouth 2 (two) times daily.       . Diphenhyd-Hydrocort-Nystatin (FIRST-DUKES MOUTHWASH) SUSP Use as directed 5 mLs in the mouth or throat 4 (four) times daily as needed.  300 mL  1  . diphenoxylate-atropine (LOMOTIL) 2.5-0.025 MG per tablet Take 2 tablets every 2-4 hours to control diarrhea  120 tablet  0  . fluconazole (DIFLUCAN) 100 MG tablet take 1 tablet by mouth once daily  30 tablet  0  . Ipratropium-Albuterol (COMBIVENT) 20-100 MCG/ACT AERS respimat Inhale 1 puff into the lungs every  6 (six) hours.  4 g  6  . lidocaine (XYLOCAINE) 2 % solution 1 teaspoonful rubbed on gums for discomfort. May mixed with 5 cc of water and gargle as needed and spit out.  100 mL  2  . lisinopril (PRINIVIL,ZESTRIL) 20 MG tablet Take 40 mg by mouth daily.      Marland Kitchen loperamide (IMODIUM) 2 MG capsule Take 2 mg by mouth as needed for diarrhea or loose stools.      Marland Kitchen LORazepam (ATIVAN) 0.5 MG tablet Take 1 tablet (0.5 mg total) by mouth every 8 (eight) hours as needed (nausea/vomiting.  Take PO or SL.).  60 tablet  2  . metoprolol (LOPRESSOR) 50 MG tablet Take 50 mg by mouth 3 (three) times daily.      Marland Kitchen morphine (MS CONTIN) 30 MG 12 hr tablet Take 30 mg by mouth every 12 (twelve) hours.      . multivitamin (THERAGRAN) per tablet Take 1 tablet by mouth daily.        Marland Kitchen oxyCODONE (OXY IR/ROXICODONE) 5 MG immediate release tablet Take 1 or 2 tablets every 4 hours to control pain  100 tablet  0  . pantoprazole (PROTONIX) 20 MG tablet Take 1 tablet (20 mg total) by  mouth daily.  90 tablet  3  . prochlorperazine (COMPAZINE) 10 MG tablet Take 1 tablet (10 mg total) by mouth every 6 (six) hours as needed for nausea, vomiting or refractory nausea / vomiting.  45 tablet  2  . regorafenib (STIVARGA) 40 MG tablet Take 2 tablets daily for 21 days , rest 7 days and repeat taking the medication with low fat meal. Caution: Chemotherapy.  56 tablet  6  . Rivaroxaban (XARELTO) 15 MG TABS tablet Take 1 tablet (15 mg total) by mouth 2 (two) times daily with a meal.  42 tablet  0  . triamterene-hydrochlorothiazide (MAXZIDE-25) 37.5-25 MG per tablet Take 1 tablet by mouth daily.  90 tablet  3   No current facility-administered medications for this visit.   Facility-Administered Medications Ordered in Other Visits  Medication Dose Route Frequency Provider Last Rate Last Dose  . sodium chloride 0.9 % injection 10 mL  10 mL Intravenous PRN Baird Cancer, PA-C   10 mL at 09/19/12 1037  . sucralfate (CARAFATE) 1 GM/10ML  suspension 1 g  1 g Oral TID WC & HS Farrel Gobble, MD        Physical Findings: The patient is in no acute distress. Patient is alert and oriented. Vitals with BMI 10/31/2013  Height   Weight (No Data)  BMI   Systolic 784  Diastolic 696  Pulse 98  Respirations 20   Tired, face a little swollen, in wheelchair  ECOG = 3  0 - Asymptomatic (Fully active, able to carry on all predisease activities without restriction)  1 - Symptomatic but completely ambulatory (Restricted in physically strenuous activity but ambulatory and able to carry out work of a light or sedentary nature. For example, light housework, office work)  2 - Symptomatic, <50% in bed during the day (Ambulatory and capable of all self care but unable to carry out any work activities. Up and about more than 50% of waking hours)  3 - Symptomatic, >50% in bed, but not bedbound (Capable of only limited self-care, confined to bed or chair 50% or more of waking hours)  4 - Bedbound (Completely disabled. Cannot carry on any self-care. Totally confined to bed or chair)  5 - Death   Eustace Pen MM, Creech RH, Tormey DC, et al. [phone]). "Toxicity and response criteria of the [facility]". [place] Oncol. 5 (6): 649-55 .      Lab Findings: Lab Results  Component Value Date   WBC 13.5* 10/18/2013   HGB 15.3 10/18/2013   HCT 45.3 10/18/2013   MCV 94.2 10/18/2013   PLT 185 10/18/2013    Radiographic Findings: Ct Chest W Contrast  10/31/2013   CLINICAL DATA:  Shortness of breath and weakness. Colon cancer. Question SVC syndrome. Chest and neck turns purple when patient lays down.  EXAM: CT CHEST WITH CONTRAST  TECHNIQUE: Multidetector CT imaging of the chest was performed during intravenous contrast administration.  CONTRAST:  12mL OMNIPAQUE IOHEXOL 300 MG/ML  SOLN  COMPARISON:  07/02/2013.  FINDINGS: Well-circumscribed low attenuation is seen in the right internal jugular vein. Low SVC appears narrowed  (series 2, image 25).  Left supraclavicular adenopathy measures up to 1.9 cm in short axis, stable. There are new left supraclavicular lymph nodes as well. Mediastinal adenopathy measures up to 3.1 cm in the lower right paratracheal station (previously 1.9 cm). Bulky bi hilar adenopathy measures up to 3.0 cm on the right (previously 2.4 cm). Associated narrowing of the left main  pulmonary artery and left upper lobe bronchus. Bulky subcarinal adenopathy and enlarged lymph nodes along the course of the descending thoracic aorta are seen as well. Heart size normal. No pericardial effusion.  There are new and enlarging nodules and masses in the lungs bilaterally. Index mass in the right lower lobe measures 4.2 x 5.9 cm (previously 4.0 x 5.3 cm). Moderate to large left pleural effusion is stable. Associated pleural implants are again noted. Airway is otherwise unremarkable.  Incidental imaging of the upper abdomen shows new and enlarging lesions in the liver. Index left hepatic lobe lesion measures 2.2 cm (previously 1.0 cm). Small stone is seen in the gallbladder. Right adrenal gland is unremarkable. Left adrenal nodule measures 1.2 x 1.3 cm, new. Visualized portions of the kidneys and spleen are unremarkable. New intermediate density lesions are seen along the surface of the pancreas, measuring up to 1.6 x 3.9 cm. Visualized portions of the stomach and bowel are grossly unremarkable. Upper abdominal lymph nodes measure up to 11 mm in the gastrohepatic ligament. No definite worrisome lytic or sclerotic lesions.  IMPRESSION: 1. Well-circumscribed low attenuation within the right internal jugular vein is highly suspicious for thrombus. Critical Value/emergent results were called by telephone at the time of interpretation on 10/31/2013 at 10:22 am to [facility], RN , who verbally acknowledged these results. 2. Marked progression of metastatic disease as evidenced by enlarging bulky left supraclavicular/mediastinal/bi  hilar adenopathy, enlarging/new pulmonary parenchymal metastases, enlarging hepatic metastases, new left adrenal metastasis and new pancreatic metastases. 3. Moderate to large left pleural effusion with pleural implants, as before. 4. Marked narrowing of the left main pulmonary artery and left upper lobe bronchus due to extrinsic compression by bulky adenopathy. 5. Cholelithiasis.   Electronically Signed   By: Lorin Picket M.D.   On: 10/31/2013 10:22   Mr Jeri Cos ZT Contrast  10/19/2013   CLINICAL DATA:  SRS restaging. Metastatic rectal cancer. Radiation to right cerebellar lesion 07/23/2013.  EXAM: MRI HEAD WITHOUT AND WITH CONTRAST  TECHNIQUE: Multiplanar, multiecho pulse sequences of the brain and surrounding structures were obtained without and with intravenous contrast.  CONTRAST:  101mL MULTIHANCE GADOBENATE DIMEGLUMINE 529 MG/ML IV SOLN  COMPARISON:  07/16/2013  FINDINGS: There is a punctate focus of apparent restricted diffusion in the right putamen. There is no acute large territory infarct.  Right cerebellar lesion has increased in size, measuring 17 x 14 mm (series 10, image 42, previously 11 x 10 mm), but now only demonstrates peripheral enhancement with new central necrosis. There is minimal surrounding edema, slightly increased from prior. A small amount of intrinsic T1 hyperintensity in the region of the left cerebellopontine angle is unchanged, likely artifactual.  There is a new, 2 mm enhancing lesion in the right occipital lobe (series 10, image 75). There is a new 2 mm enhancing lesion in the right frontal lobe (series 10, image 111). There is no significant edema about these 2 new lesions. Patchy T2 hyperintensities in the subcortical and deep cerebral white matter and pons are nonspecific but may reflect post therapy changes and chronic small vessel ischemic disease. There is mild cerebral atrophy. There is no midline shift or extra-axial fluid collection. There is no acute intracranial  hemorrhage.  Orbits are unremarkable. There is mild bilateral frontal and ethmoid sinus mucosal thickening. Mastoid air cells are clear. Narrowing of the distal right vertebral artery is similar to the prior study. Major intracranial vascular flow voids otherwise appear preserved.  IMPRESSION: 1. Mildly increased size of  right cerebellar metastasis with new central necrosis, consistent with post radiation change. 2. Two new 2 mm metastases in the right occipital and right frontal lobes. 3. Punctate acute to subacute infarct in the right putamen. These results will be called to the ordering clinician or representative by the Radiologist Assistant, and communication documented in the PACS or zVision Dashboard.   Electronically Signed   By: Logan Bores   On: 10/19/2013 10:33    Impression/Plan: Reviewed CT with family.  I think palliative RT to chest may help his breathing, and he should tolerate it relatively well. But they know his disease has progressed dramatically and no matter what, his prognosis is not good.   Proceed with CT simulation.  We discussed the risks, benefits, and side effects of radiotherapy. No guarantees of treatment were given. A consent form was signed and placed in the patient's medical record. The patient is enthusiastic about proceeding with treatment. I look forward to participating in the patient's care. 20Gy/5 fractions to bulky disease in chest, start RT tomorrow.   I have instructed him to taper to Decadron 4mg  daily. His HAs are not bad and his brain disease is relatively well controlled now. We will see how we does on that dose.  I will consult Wadie Lessen of palliative medicine to see if she can meet with him next week.   15 minutes spent on this encounter, over 50% face to face time spent on counseling and coordination of care. _____________________________________   Eppie Gibson, MD

## 2013-10-31 NOTE — Patient Instructions (Signed)
[facility] Discharge Instructions  RECOMMENDATIONS MADE BY THE CONSULTANT AND ANY TEST RESULTS WILL BE SENT TO YOUR REFERRING PHYSICIAN.  EXAM FINDINGS BY THE PHYSICIAN TODAY AND SIGNS OR SYMPTOMS TO REPORT TO CLINIC OR PRIMARY PHYSICIAN: Exam and findings as discussed by Dr. Barnet Glasgow. Dr. Lanell Persons will see you at the [facility] on [address] in [place] today at 12:30 pm.  MEDICATIONS PRESCRIBED:  Xarelto 15 mg twice daily for 21 days then 20 mg daily  INSTRUCTIONS/FOLLOW-UP: Follow-up in 3  weeks  Thank you for choosing [facility] to provide your oncology and hematology care.  To afford each patient quality time with our providers, please arrive at least 15 minutes before your scheduled appointment time.  With your help, our goal is to use those 15 minutes to complete the necessary work-up to ensure our physicians have the information they need to help with your evaluation and healthcare recommendations.    Effective January 1st, 2014, we ask that you re-schedule your appointment with our physicians should you arrive 10 or more minutes late for your appointment.  We strive to give you quality time with our providers, and arriving late affects you and other patients whose appointments are after yours.    Again, thank you for choosing [facility].  Our hope is that these requests will decrease the amount of time that you wait before being seen by our physicians.       _____________________________________________________________  Should you have questions after your visit to [facility], please contact our office at [phone] between the hours of 8:30 a.m. and 4:30 p.m.  Voicemails left after 4:30 p.m. will not be returned until the following business day.  For prescription refill requests, have your pharmacy contact our office with your prescription refill request.     _______________________________________________________________  We hope that we have given you very good care.  You may receive a patient satisfaction survey in the mail, please complete it and return it as soon as possible.  We value your feedback!  _______________________________________________________________  Have you asked about our STAR program?  STAR stands for Survivorship Training and Rehabilitation, and this is a nationally recognized cancer care program that focuses on survivorship and rehabilitation.  Cancer and cancer treatments may cause problems, such as, pain, making you feel tired and keeping you from doing the things that you need or want to do. Cancer rehabilitation can help. Our goal is to reduce these troubling effects and help you have the best quality of life possible.  You may receive a survey from a nurse that asks questions about your current state of health.  Based on the survey results, all eligible patients will be referred to the [facility] program for an evaluation so we can better serve you!  A frequently asked questions sheet is available upon request. Rivaroxaban oral tablets What is this medicine? RIVAROXABAN (ri va ROX a ban) is an anticoagulant (blood thinner). It is used to treat blood clots in the lungs or in the veins. It is also used after knee or hip surgeries to prevent blood clots. It is also used to lower the chance of stroke in people with a medical condition called atrial fibrillation. This medicine may be used for other purposes; ask your health care provider or pharmacist if you have questions. COMMON BRAND NAME(S): Xarelto, Xarelto Starter Pack What should I tell my health care provider before I take this medicine? They need  to know if you have any of these conditions: -bleeding disorders -bleeding in the brain -blood in your stools (black or tarry stools) or if you have blood in your vomit -history of stomach bleeding -kidney disease -liver  disease -low blood counts, like low white cell, platelet, or red cell counts -recent or planned spinal or epidural procedure -take medicines that treat or prevent blood clots -an unusual or allergic reaction to rivaroxaban, other medicines, foods, dyes, or preservatives -pregnant or trying to get pregnant -breast-feeding How should I use this medicine? Take this medicine by mouth with a glass of water. Follow the directions on the prescription label. Take your medicine at regular intervals. Do not take it more often than directed. Do not stop taking except on your doctor's advice. Stopping this medicine may increase your risk of a blot clot. Be sure to refill your prescription before you run out of medicine. If you are taking this medicine after hip or knee replacement surgery, take it with or without food. If you are taking this medicine for atrial fibrillation, take it with your evening meal. If you are taking this medicine to treat blood clots, take it with food at the same time each day. If you are unable to swallow your tablet, you may crush the tablet and mix it in applesauce. Then, immediately eat the applesauce. You should eat more food right after you eat the applesauce containing the crushed tablet. Talk to your pediatrician regarding the use of this medicine in children. Special care may be needed. Overdosage: If you think you have taken too much of this medicine contact a poison control center or emergency room at once. NOTE: This medicine is only for you. Do not share this medicine with others. What if I miss a dose? If you take your medicine once a day and miss a dose, take the missed dose as soon as you remember. If you take your medicine twice a day and miss a dose, take the missed dose immediately. In this instance, 2 tablets may be taken at the same time. The next day you should take 1 tablet twice a day as directed. What may interact with this medicine? -aspirin and aspirin-like  medicines -certain antibiotics like erythromycin, azithromycin, and clarithromycin -certain medicines for fungal infections like ketoconazole and itraconazole -certain medicines for irregular heart beat like amiodarone, quinidine, dronedarone -certain medicines for seizures like carbamazepine, phenytoin -certain medicines that treat or prevent blood clots like warfarin, enoxaparin, and dalteparin -conivaptan -diltiazem -felodipine -indinavir -lopinavir; ritonavir -NSAIDS, medicines for pain and inflammation, like ibuprofen or naproxen -ranolazine -rifampin -ritonavir -St. John's wort -verapamil This list may not describe all possible interactions. Give your health care provider a list of all the medicines, herbs, non-prescription drugs, or dietary supplements you use. Also tell them if you smoke, drink alcohol, or use illegal drugs. Some items may interact with your medicine. What should I watch for while using this medicine? Visit your doctor or health care professional for regular checks on your progress. Your condition will be monitored carefully while you are receiving this medicine. Notify your doctor or health care professional and seek emergency treatment if you develop breathing problems; changes in vision; chest pain; severe, sudden headache; pain, swelling, warmth in the leg; trouble speaking; sudden numbness or weakness of the face, arm, or leg. These can be signs that your condition has gotten worse. If you are going to have surgery, tell your doctor or health care professional that you are taking this  medicine. Tell your health care professional that you use this medicine before you have a spinal or epidural procedure. Sometimes people who take this medicine have bleeding problems around the spine when they have a spinal or epidural procedure. This bleeding is very rare. If you have a spinal or epidural procedure while on this medicine, call your health care professional  immediately if you have back pain, numbness or tingling (especially in your legs and feet), muscle weakness, paralysis, or loss of bladder or bowel control. Avoid sports and activities that might cause injury while you are using this medicine. Severe falls or injuries can cause unseen bleeding. Be careful when using sharp tools or knives. Consider using an Copy. Take special care brushing or flossing your teeth. Report any injuries, bruising, or red spots on the skin to your doctor or health care professional. What side effects may I notice from receiving this medicine? Side effects that you should report to your doctor or health care professional as soon as possible: -allergic reactions like skin rash, itching or hives, swelling of the face, lips, or tongue -back pain -redness, blistering, peeling or loosening of the skin, including inside the mouth -signs and symptoms of bleeding such as bloody or black, tarry stools; red or dark-brown urine; spitting up blood or brown material that looks like coffee grounds; red spots on the skin; unusual bruising or bleeding from the eye, gums, or nose Side effects that usually do not require medical attention (Report these to your doctor or health care professional if they continue or are bothersome.): -dizziness -muscle pain This list may not describe all possible side effects. Call your doctor for medical advice about side effects. You may report side effects to FDA at 1-800-FDA-1088. Where should I keep my medicine? Keep out of the reach of children. Store at room temperature between 15 and 30 degrees C (59 and 86 degrees F). Throw away any unused medicine after the expiration date. NOTE: This sheet is a summary. It may not cover all possible information. If you have questions about this medicine, talk to your doctor, pharmacist, or health care provider.  2015, [employer]vier/[employer] Standard. (2013-06-28 18:47:48)

## 2013-11-01 ENCOUNTER — Ambulatory Visit
Admission: RE | Admit: 2013-11-01 | Discharge: 2013-11-01 | Disposition: A | Discharge status: Home / Self Care | Payer: Medicare Other | Source: Ambulatory Visit | Attending: Radiation Oncology | Admitting: Radiation Oncology

## 2013-11-01 DIAGNOSIS — Z510 Encounter for antineoplastic radiation therapy: Secondary | ICD-10-CM | POA: Diagnosis not present

## 2013-11-01 NOTE — Progress Notes (Signed)
Patient presented to the clinic today following treatment accompanied by his sister and son. Vitals WDL. Patient denies pain. Sister reports he ate well last night and this morning. Patient and family confirm they feel safe taking him home. Instructed patient to continue Xarelto 15 mg BID for 21 days beginning tomorrow rather than Lovenox as directed by Dr. Isidore Moos. Patient reports the only Lovenox injection he received was in the hospital but, was sent home with a Xarelto script he began taking today. Sister states, "but i thought the xarelto bottle read for him to only take it once per day." Confirmed in EPIC the patient is to take the Xarelto twice per day and sister verbalized understanding. Sister reports the xarelto script for 42 tablets was $489. She explains the pharmacy found a coupon thus he managed to get 30 tablets for FREE. The pharmacy is holding the other 12 while the family searches for another coupon.

## 2013-11-02 ENCOUNTER — Ambulatory Visit
Admission: RE | Admit: 2013-11-02 | Discharge: 2013-11-02 | Disposition: A | Discharge status: Home / Self Care | Payer: Medicare Other | Source: Ambulatory Visit | Attending: Radiation Oncology | Admitting: Radiation Oncology

## 2013-11-02 ENCOUNTER — Encounter: Payer: Self-pay | Admitting: Radiation Oncology

## 2013-11-02 ENCOUNTER — Ambulatory Visit (HOSPITAL_COMMUNITY): Payer: Medicare Other

## 2013-11-02 DIAGNOSIS — Z510 Encounter for antineoplastic radiation therapy: Secondary | ICD-10-CM | POA: Diagnosis not present

## 2013-11-05 ENCOUNTER — Ambulatory Visit: Payer: Medicare Other

## 2013-11-05 ENCOUNTER — Ambulatory Visit: Admission: RE | Admit: 2013-11-05 | Payer: Medicare Other | Source: Ambulatory Visit | Admitting: Radiation Oncology

## 2013-11-05 ENCOUNTER — Telehealth: Payer: Self-pay | Admitting: *Deleted

## 2013-11-05 ENCOUNTER — Ambulatory Visit (HOSPITAL_COMMUNITY): Payer: Medicare Other

## 2013-11-05 NOTE — Telephone Encounter (Signed)
Returned Martin Maldonado's sisters call(Martin Maldonado).  Martin Maldonado canceled his treatment today because of increased difficulty with SOB just lying in the bed and when ambulating to the bathroom.  His sister reports that he went to the bathroom this am and afterwards his O2 Sat was 97% which is pretty consistent. Therefore, he does not qualify for oxygen at home.  He reports that heat and humidity make his breathing worst and he just "can't come to treatment in this heat".  When asked if he wanted to come to the hospital he stated, "please don't let me die in the hospital."  At this point, his sister replied that being admitted would make it easier on him while receiving treatment and that Dr. Barnet Glasgow has stated that if need be, he would admit him.  She is not sure if he will come for treatment on tomorrow. Has received 1/5 fractions to his chest.  Martin Maldonado stated okay to call in the am for a status check.   CT 10/31/13 revealed: evidence of bulky mediastinal adenopathy along with internal jugular vein thrombosis. He is having difficulty in swallowing as well as experiencing more hoarseness. He has episodes of cyanosis.

## 2013-11-06 ENCOUNTER — Ambulatory Visit: Payer: Medicare Other

## 2013-11-06 ENCOUNTER — Telehealth (HOSPITAL_COMMUNITY): Payer: Self-pay | Admitting: Emergency Medicine

## 2013-11-06 ENCOUNTER — Encounter: Payer: Self-pay | Admitting: *Deleted

## 2013-11-06 NOTE — Telephone Encounter (Signed)
Telephone call  

## 2013-11-06 NOTE — Progress Notes (Addendum)
Called and spoke with Martin Malta, RN for Dr. Barnet Glasgow at [facility] and after she relayed information on Martin Maldonado status to the PA, she was instructed to inform us that patient would need to be admitted to either [facility] for medical care or [facility] if he desires to continue his palliative Radiation Therapy treatments.  Martin Maldonado does not think he would want to come for a "prolonged" ED visit to be admitted to [facility], and she has concerns about the ability to get him here by herself.  Mentioned the possibiity of Hospice to her and explained that they can deliver medical care to assist in easing his pain and provide support of his respiratory status.  She stated that she did not feel he is ready for this step, but would broach the subject with him.  She feels it might be better to take him to [facility].  She will call back today.

## 2013-11-07 ENCOUNTER — Inpatient Hospital Stay (HOSPITAL_COMMUNITY)
Admission: EM | Admit: 2013-11-07 | Discharge: 2013-11-08 | DRG: 189 | Disposition: A | Discharge status: Home / Self Care | Payer: Medicare Other | Attending: Family Medicine | Admitting: Family Medicine

## 2013-11-07 ENCOUNTER — Encounter (HOSPITAL_COMMUNITY): Payer: Self-pay | Admitting: Emergency Medicine

## 2013-11-07 ENCOUNTER — Ambulatory Visit: Payer: Medicare Other

## 2013-11-07 ENCOUNTER — Emergency Department (HOSPITAL_COMMUNITY): Payer: Medicare Other

## 2013-11-07 DIAGNOSIS — C7931 Secondary malignant neoplasm of brain: Secondary | ICD-10-CM

## 2013-11-07 DIAGNOSIS — I82C19 Acute embolism and thrombosis of unspecified internal jugular vein: Secondary | ICD-10-CM | POA: Diagnosis present

## 2013-11-07 DIAGNOSIS — J9600 Acute respiratory failure, unspecified whether with hypoxia or hypercapnia: Principal | ICD-10-CM | POA: Diagnosis present

## 2013-11-07 DIAGNOSIS — F329 Major depressive disorder, single episode, unspecified: Secondary | ICD-10-CM | POA: Diagnosis present

## 2013-11-07 DIAGNOSIS — Z951 Presence of aortocoronary bypass graft: Secondary | ICD-10-CM | POA: Diagnosis not present

## 2013-11-07 DIAGNOSIS — Z87891 Personal history of nicotine dependence: Secondary | ICD-10-CM | POA: Diagnosis not present

## 2013-11-07 DIAGNOSIS — C7949 Secondary malignant neoplasm of other parts of nervous system: Secondary | ICD-10-CM

## 2013-11-07 DIAGNOSIS — Z923 Personal history of irradiation: Secondary | ICD-10-CM

## 2013-11-07 DIAGNOSIS — R627 Adult failure to thrive: Secondary | ICD-10-CM | POA: Diagnosis present

## 2013-11-07 DIAGNOSIS — C20 Malignant neoplasm of rectum: Secondary | ICD-10-CM

## 2013-11-07 DIAGNOSIS — J90 Pleural effusion, not elsewhere classified: Secondary | ICD-10-CM

## 2013-11-07 DIAGNOSIS — J9601 Acute respiratory failure with hypoxia: Secondary | ICD-10-CM

## 2013-11-07 DIAGNOSIS — C7800 Secondary malignant neoplasm of unspecified lung: Secondary | ICD-10-CM | POA: Diagnosis present

## 2013-11-07 DIAGNOSIS — R0602 Shortness of breath: Secondary | ICD-10-CM | POA: Diagnosis not present

## 2013-11-07 DIAGNOSIS — Z86718 Personal history of other venous thrombosis and embolism: Secondary | ICD-10-CM | POA: Diagnosis not present

## 2013-11-07 DIAGNOSIS — E785 Hyperlipidemia, unspecified: Secondary | ICD-10-CM | POA: Diagnosis present

## 2013-11-07 DIAGNOSIS — C771 Secondary and unspecified malignant neoplasm of intrathoracic lymph nodes: Secondary | ICD-10-CM | POA: Diagnosis present

## 2013-11-07 DIAGNOSIS — C779 Secondary and unspecified malignant neoplasm of lymph node, unspecified: Secondary | ICD-10-CM

## 2013-11-07 DIAGNOSIS — F3289 Other specified depressive episodes: Secondary | ICD-10-CM | POA: Diagnosis present

## 2013-11-07 DIAGNOSIS — Z9221 Personal history of antineoplastic chemotherapy: Secondary | ICD-10-CM | POA: Diagnosis not present

## 2013-11-07 DIAGNOSIS — J4489 Other specified chronic obstructive pulmonary disease: Secondary | ICD-10-CM

## 2013-11-07 DIAGNOSIS — E860 Dehydration: Secondary | ICD-10-CM | POA: Diagnosis present

## 2013-11-07 DIAGNOSIS — I2510 Atherosclerotic heart disease of native coronary artery without angina pectoris: Secondary | ICD-10-CM | POA: Diagnosis present

## 2013-11-07 DIAGNOSIS — C189 Malignant neoplasm of colon, unspecified: Secondary | ICD-10-CM | POA: Diagnosis present

## 2013-11-07 DIAGNOSIS — I10 Essential (primary) hypertension: Secondary | ICD-10-CM | POA: Diagnosis present

## 2013-11-07 DIAGNOSIS — F17201 Nicotine dependence, unspecified, in remission: Secondary | ICD-10-CM

## 2013-11-07 DIAGNOSIS — Z933 Colostomy status: Secondary | ICD-10-CM | POA: Diagnosis not present

## 2013-11-07 DIAGNOSIS — C50919 Malignant neoplasm of unspecified site of unspecified female breast: Secondary | ICD-10-CM | POA: Diagnosis present

## 2013-11-07 DIAGNOSIS — R0902 Hypoxemia: Secondary | ICD-10-CM

## 2013-11-07 DIAGNOSIS — C772 Secondary and unspecified malignant neoplasm of intra-abdominal lymph nodes: Secondary | ICD-10-CM | POA: Diagnosis present

## 2013-11-07 DIAGNOSIS — C761 Malignant neoplasm of thorax: Secondary | ICD-10-CM | POA: Diagnosis present

## 2013-11-07 DIAGNOSIS — Z7401 Bed confinement status: Secondary | ICD-10-CM | POA: Diagnosis not present

## 2013-11-07 DIAGNOSIS — C799 Secondary malignant neoplasm of unspecified site: Secondary | ICD-10-CM

## 2013-11-07 DIAGNOSIS — J449 Chronic obstructive pulmonary disease, unspecified: Secondary | ICD-10-CM

## 2013-11-07 DIAGNOSIS — C801 Malignant (primary) neoplasm, unspecified: Secondary | ICD-10-CM

## 2013-11-07 LAB — CBC WITH DIFFERENTIAL/PLATELET
BASOS ABS: 0 10*3/uL (ref 0.0–0.1)
Basophils Relative: 0 % (ref 0–1)
Eosinophils Absolute: 0 10*3/uL (ref 0.0–0.7)
Eosinophils Relative: 0 % (ref 0–5)
HEMATOCRIT: 36.4 % — AB (ref 39.0–52.0)
HEMOGLOBIN: 12.2 g/dL — AB (ref 13.0–17.0)
LYMPHS PCT: 5 % — AB (ref 12–46)
Lymphs Abs: 0.4 10*3/uL — ABNORMAL LOW (ref 0.7–4.0)
MCH: 31.2 pg (ref 26.0–34.0)
MCHC: 33.5 g/dL (ref 30.0–36.0)
MCV: 93.1 fL (ref 78.0–100.0)
MONO ABS: 0.2 10*3/uL (ref 0.1–1.0)
MONOS PCT: 2 % — AB (ref 3–12)
Neutro Abs: 8.3 10*3/uL — ABNORMAL HIGH (ref 1.7–7.7)
Neutrophils Relative %: 93 % — ABNORMAL HIGH (ref 43–77)
Platelets: 194 10*3/uL (ref 150–400)
RBC: 3.91 MIL/uL — ABNORMAL LOW (ref 4.22–5.81)
RDW: 15.8 % — AB (ref 11.5–15.5)
WBC: 8.9 10*3/uL (ref 4.0–10.5)

## 2013-11-07 LAB — BASIC METABOLIC PANEL
ANION GAP: 14 (ref 5–15)
BUN: 14 mg/dL (ref 6–23)
CHLORIDE: 97 meq/L (ref 96–112)
CO2: 26 mEq/L (ref 19–32)
CREATININE: 0.97 mg/dL (ref 0.50–1.35)
Calcium: 8.6 mg/dL (ref 8.4–10.5)
GFR calc Af Amer: >90 mL/min (ref >90)
GFR calc non Af Amer: >90 mL/min (ref >90)
GLUCOSE: 101 mg/dL — AB (ref 70–99)
Potassium: 3.7 mEq/L (ref 3.7–5.3)
Sodium: 137 mEq/L (ref 137–147)

## 2013-11-07 LAB — TROPONIN I: Troponin I: <0.3 ng/mL (ref <0.30)

## 2013-11-07 MED ORDER — IPRATROPIUM-ALBUTEROL 0.5-2.5 (3) MG/3ML IN SOLN
3.0000 mL | Freq: Once | RESPIRATORY_TRACT | Status: AC
  Administered 2013-11-07: 3 mL via RESPIRATORY_TRACT
  Filled 2013-11-07: qty 3

## 2013-11-07 MED ORDER — MORPHINE SULFATE 4 MG/ML IJ SOLN
4.0000 mg | Freq: Once | INTRAMUSCULAR | Status: AC
  Administered 2013-11-07: 4 mg via INTRAVENOUS
  Filled 2013-11-07: qty 1

## 2013-11-07 MED ORDER — MORPHINE SULFATE (CONCENTRATE) 10 MG /0.5 ML PO SOLN
5.0000 mg | Freq: Four times a day (QID) | ORAL | Status: DC
  Administered 2013-11-07 – 2013-11-08 (×4): 5 mg via ORAL
  Filled 2013-11-07 (×4): qty 0.5

## 2013-11-07 MED ORDER — OXYCODONE HCL 5 MG PO TABS: 5.0000 mg | ORAL_TABLET | Freq: Four times a day (QID) | ORAL | Status: DC | PRN

## 2013-11-07 MED ORDER — DM-GUAIFENESIN ER 30-600 MG PO TB12
1.0000 | ORAL_TABLET | Freq: Two times a day (BID) | ORAL | Status: DC | PRN
  Administered 2013-11-07: 1 via ORAL
  Filled 2013-11-07: qty 1

## 2013-11-07 MED ORDER — DEXAMETHASONE 4 MG PO TABS
8.0000 mg | ORAL_TABLET | Freq: Two times a day (BID) | ORAL | Status: DC
  Administered 2013-11-08: 8 mg via ORAL
  Filled 2013-11-07 (×4): qty 2

## 2013-11-07 MED ORDER — LORAZEPAM 0.5 MG PO TABS
0.5000 mg | ORAL_TABLET | Freq: Three times a day (TID) | ORAL | Status: DC | PRN
Start: 2013-11-07 — End: 2013-11-07

## 2013-11-07 MED ORDER — IOHEXOL 350 MG/ML SOLN
100.0000 mL | Freq: Once | INTRAVENOUS | Status: AC | PRN
  Administered 2013-11-07: 100 mL via INTRAVENOUS

## 2013-11-07 MED ORDER — ONDANSETRON HCL 4 MG/2ML IJ SOLN
4.0000 mg | Freq: Four times a day (QID) | INTRAMUSCULAR | Status: DC | PRN
Start: 2013-11-07 — End: 2013-11-08

## 2013-11-07 MED ORDER — RIVAROXABAN 15 MG PO TABS
15.0000 mg | ORAL_TABLET | Freq: Two times a day (BID) | ORAL | Status: DC
  Administered 2013-11-07 – 2013-11-08 (×2): 15 mg via ORAL
  Filled 2013-11-07 (×2): qty 1

## 2013-11-07 MED ORDER — MORPHINE SULFATE 4 MG/ML IJ SOLN: 4.0000 mg | INTRAMUSCULAR | Status: DC | PRN

## 2013-11-07 MED ORDER — IPRATROPIUM-ALBUTEROL 0.5-2.5 (3) MG/3ML IN SOLN
3.0000 mL | Freq: Four times a day (QID) | RESPIRATORY_TRACT | Status: DC
  Administered 2013-11-07 – 2013-11-08 (×4): 3 mL via RESPIRATORY_TRACT
  Filled 2013-11-07 (×4): qty 3

## 2013-11-07 MED ORDER — SODIUM CHLORIDE 0.9 % IV SOLN
INTRAVENOUS | Status: DC
  Administered 2013-11-07: 19:00:00 via INTRAVENOUS

## 2013-11-07 MED ORDER — CETYLPYRIDINIUM CHLORIDE 0.05 % MT LIQD
7.0000 mL | Freq: Two times a day (BID) | OROMUCOSAL | Status: DC
  Administered 2013-11-08: 7 mL via OROMUCOSAL

## 2013-11-07 MED ORDER — LORAZEPAM 0.5 MG PO TABS: 0.5000 mg | ORAL_TABLET | Freq: Four times a day (QID) | ORAL | Status: DC | PRN

## 2013-11-07 MED ORDER — ONDANSETRON HCL 4 MG PO TABS
4.0000 mg | ORAL_TABLET | Freq: Four times a day (QID) | ORAL | Status: DC | PRN
Start: 2013-11-07 — End: 2013-11-08

## 2013-11-07 MED ORDER — IPRATROPIUM-ALBUTEROL 20-100 MCG/ACT IN AERS: 1.0000 | INHALATION_SPRAY | Freq: Four times a day (QID) | RESPIRATORY_TRACT | Status: DC

## 2013-11-07 NOTE — ED Notes (Signed)
Ambulated patient at dr request.  Patient walked 15-20 steps and became very SOB - o2 sat dropped to 84%  Dr. Ree Kida

## 2013-11-07 NOTE — ED Notes (Signed)
Report given to [facility]. Patient being transferred to floor for admission.

## 2013-11-07 NOTE — ED Provider Notes (Signed)
CSN: [id number]     Arrival date & time 11/07/13  1232 History   First MD Initiated Contact with Patient 11/07/13 1244     Chief Complaint  Patient presents with  . 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 The patient and his family, he is receiving palliative radiation for obstructing metastasis in his lungs. If the last 3 days, he has not been able to get his appointment secondary to shortness of breath. He reports worsening dyspnea on exertion. Oxygen saturations upon rest have been "normal" at home. However, family states that with any ambulation or exertion, patient becomes acutely more short of breath. He uses Combivent at home and that has not been helping. He is also on steroids daily. He denies any fevers or cough. Recently had a CT scan which showed this known metastatic lung nodules as well as a thrombus. Patient was placed on Xarelto.  Past Medical History  Diagnosis Date  . Hypertension   . Rectal cancer 2008    Adenocarcinoma; AP resection in 2008; recurrence with lung metastases in 2011  . Arteriosclerotic cardiovascular disease (ASCVD) 2011    CABG surgery in 2011  . Hyperlipidemia   . 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 Splenomegaly 01/23/2009    Additional GI history includes small bowel obstruction, requiring laparotomy and lysis of adhesions in 2009, remote peptic ulcer disease and postoperative perirectal infection  . ALCOHOL ABUSE, HX OF 01/23/2009  . Brain metastasis 07/02/2013  . Status post chemotherapy     Leucovorin/5-FU plus Avastin-discontinued due to Grade 4 Esohagitis  . S/P radiation therapy  07/23/2013    Right cerebellar  . On antineoplastic chemotherapy    Stivarga 80 mg 21 days on and 7 day respite     Past Surgical History  Procedure Laterality Date  . Irrigation and debridement sebaceous cyst    . Laparoscopic lysis intestinal adhesions  2009    With incidental appendectomy  . Coronary artery bypass graft  2011    2 vessel  . Abdominoperineal proctocolectomy  2008  . Portacath placement  2011  . Colostomy     Family History  Problem Relation Age of Onset  . Cancer Mother   . Cancer Maternal Grandmother    History  Substance Use Topics  . Smoking status: Former Smoker -- 1.00 packs/day for 50 years    Quit date: 08/29/2009  . Smokeless tobacco: Never Used  . Alcohol Use: No     Comment: History of excessive alcohol use-remote    Review of Systems  Constitutional: Positive for fatigue. Negative for fever.  Respiratory: Positive for shortness of breath. Negative for chest tightness.   Cardiovascular: Negative.  Negative for chest pain and leg swelling.  Gastrointestinal: Negative.  Negative for abdominal pain.  Genitourinary: Negative.  Negative for dysuria.  Neurological: Negative for headaches.  All other systems reviewed and are negative.     Allergies  Erbitux  Home Medications   Prior to Admission medications   Medication Sig Start Date End Date Taking? Authorizing Provider  dexamethasone (DECADRON) 4 MG tablet Take 8 mg by mouth 2 (two) times daily with a meal.   Yes Historical Provider, MD  dextromethorphan-guaiFENesin (MUCINEX DM) 30-600 MG per 12 hr tablet Take 1 tablet  by mouth 2 (two) times daily as needed.   Yes Historical Provider, MD  Ipratropium-Albuterol (COMBIVENT) 20-100 MCG/ACT AERS respimat Inhale 1 puff into the lungs every 6 (six) hours. 10/18/13  Yes Farrel Gobble, MD  LORazepam (ATIVAN) 0.5 MG tablet Take 1 tablet (0.5 mg total) by mouth every 8 (eight) hours as needed (nausea/vomiting.  Take PO or SL.). 09/18/13  Yes Baird Cancer, PA-C  oxyCODONE (OXY IR/ROXICODONE) 5 MG immediate release  tablet Take 1 or 2 tablets every 4 hours to control pain 10/18/13  Yes Farrel Gobble, MD  Rivaroxaban (XARELTO) 15 MG TABS tablet Take 1 tablet (15 mg total) by mouth 2 (two) times daily with a meal. 10/31/13  Yes Farrel Gobble, MD   BP 123/94  Pulse 102  Temp(Src) 97.4 F (36.3 C) (Oral)  Resp 20  Ht 6\' 1"  (1.854 m)  Wt 160 lb (72.576 kg)  BMI 21.11 kg/m2  SpO2 97% Physical Exam  Nursing note and vitals reviewed. Constitutional: He is oriented to person, place, and time. No distress.  Chronically ill-appearing  HENT:  Head: Normocephalic and atraumatic.  Mucous membranes dry  Neck: No JVD present.  Cardiovascular: Regular rhythm and normal heart sounds.   No murmur heard. Mild tachycardia  Pulmonary/Chest: Effort normal. No respiratory distress.  Coarse breath sounds bilaterally, mild tachypnea  Abdominal: Soft. Bowel sounds are normal. There is no tenderness. There is no rebound.  Colostomy noted in the left mid abdomen with brown stool  Musculoskeletal: He exhibits no edema.  Neurological: He is alert and oriented to person, place, and time.  Skin: Skin is warm and dry.  Psychiatric: He has a normal mood and affect.    ED Course  Procedures (including critical care time) Labs Review Labs Reviewed  CBC WITH DIFFERENTIAL - Abnormal; Notable for the following:    RBC 3.91 (*)    Hemoglobin 12.2 (*)    HCT 36.4 (*)    RDW 15.8 (*)    Neutrophils Relative % 93 (*)    Neutro Abs 8.3 (*)    Lymphocytes Relative 5 (*)    Lymphs Abs 0.4 (*)    Monocytes Relative 2 (*)    All other components within normal limits  BASIC METABOLIC PANEL - Abnormal; Notable for the following:    Glucose, Bld 101 (*)    All other components within normal limits  TROPONIN I    Imaging Review Ct Angio Chest W/cm &/or Wo Cm  11/07/2013   CLINICAL DATA:  SHORTNESS OF BREATH  EXAM: CT ANGIOGRAPHY CHEST WITH CONTRAST  TECHNIQUE: Multidetector CT imaging of the chest was performed using the  standard protocol during bolus administration of intravenous contrast. Multiplanar CT image reconstructions and MIPs were obtained to evaluate the vascular anatomy.  CONTRAST:  173mL OMNIPAQUE IOHEXOL 350 MG/ML SOLN  COMPARISON:  10/31/2013  FINDINGS: Satisfactory opacification of pulmonary arteries noted, and there is no evidence of pulmonary emboli. Adequate contrast opacification of the thoracic aorta with no evidence of dissection, aneurysm, or stenosis. There is classic 3-vessel brachiocephalic arch anatomy without proximal stenosis. Patchy aortic calcifications. Previous CABG. Right subclavian port catheter to the proximal SVC.  Large left pleural effusion with pleural nodules. Confluent masses in the left upper lobe with left hilar adenopathy encasing and compressing the left pulmonary artery and its branches. Some increase in left lower lobe atelectasis/consolidation.  Multiple masses and nodules in the right lung, with a new small right pleural effusion. Bulky right hilar, sub carinal, left supraclavicular,  will prevascular, and right paratracheal adenopathy as before. Trace pericardial effusion.  In the visualized upper abdomen, low-attenuation liver lesions again evident.  Review of the MIP images confirms the above findings.  IMPRESSION: 1. Negative for acute PE or thoracic aortic dissection. 2. New small right pleural effusion. 3. Little interval change in multiple bilateral lung nodules and masses, bulky bilateral hilar and mediastinal adenopathy, large left pleural effusion with pleural masses, and liver lesions.   Electronically Signed   By: Arne Cleveland M.D.   On: 11/07/2013 15:14   Dg Chest Portable 1 View  11/07/2013   CLINICAL DATA:  Shortness of breath.  History of colon cancer.  EXAM: PORTABLE CHEST - 1 VIEW  COMPARISON:  Chest CT 10/31/2013  FINDINGS: There is decreased aeration throughout the left lung compared to the recent CT examination. Findings could be related to enlargement of  the left pleural effusion or volume loss. Again noted are multiple lesions throughout the right lung. Port-A-Cath tip is in the SVC region. The heart is obscured by the left chest opacities.  IMPRESSION: Increased densities throughout the left chest with decreased aeration in the left lung. Findings may represent interval enlargement of the left pleural effusion versus volume loss.  Multiple nodular lesions throughout the right lung are consistent with known metastatic disease.   Electronically Signed   By: Markus Daft M.D.   On: 11/07/2013 13:26     EKG Interpretation None      MDM   Final diagnoses:  Hypoxia  Metastatic cancer    Patient presents with worsening shortness of breath and dyspnea on exertion. He has metastatic cancer and has been unable to participate in his palliative radiation.  He is mildly tachypneic on exam. Initial oxygen saturations 87% on room air. He is placed on 2 L of nasal cannula.  Patient given morphine for  shortness of breath General lab work was unremarkable and EKG is reassuring. Repeat CT scan of the chest does not show any marked change. He does have a new small right pleural effusion. Upon ambulation, patient drops his oxygen saturations to 83%.  The patient would benefit from home oxygen.  Will admit for arrangement of home oxygen.    Merryl Hacker, MD 11/07/13 [phone]

## 2013-11-07 NOTE — ED Notes (Signed)
Hx of Lung Ca and currently having radiation. Pt came in with increased SOB and CP that started today.

## 2013-11-07 NOTE — H&P (Signed)
[facility] History and Physical  Martin Maldonado MPN:[id number] DOB: 1955-12-12 DOA: 11/07/2013  Referring physician: ER. PCP: [name], MD   Chief Complaint: Dyspnea/hypoxia.  HPI: Martin Maldonado is a 58 y.o. 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 The patient is known to have significant mediastinal lymphadenopathy and also a left moderate to large pleural effusion. The mediastinal lymphadenopathy is felt to be compressing airways from the outside. Chemotherapy has been discontinued and radiotherapy is being given as a palliative measure at this point. When he was seen in the emergency room, he was hypoxic and it was felt that he required home oxygen and this could not be organized from the emergency room. This is why he is  now being admitted. He has been very weak and is virtually bedbound throughout the day. His appetite is rather poor and he has currently lost weight. He has had difficulty in swallowing.  Review of Systems:  Constitutional:  No night sweats, Fevers, chills  HEENT:  No headaches, Tooth/dental problems,Sore throat,  No sneezing, itching, ear ache, nasal congestion, post nasal drip,  Cardio-vascular:  No chest pain, Orthopnea, PND, swelling in lower extremities, anasarca, dizziness, palpitations  GI:  No heartburn, indigestion, abdominal pain, nausea, vomiting, diarrhea, change in bowel habits  Skin:  no rash or lesions.  GU:  no dysuria, change in color of urine, no urgency or frequency. No flank pain.  Musculoskeletal:  No joint pain or swelling. No decreased range of motion. No back pain.  Psych:  No change in mood or affect. No depression or anxiety. No memory loss.   Past Medical History    Diagnosis Date  . Hypertension   . Rectal cancer 2008    Adenocarcinoma; AP resection in 2008; recurrence with lung metastases in 2011  . Arteriosclerotic cardiovascular disease (ASCVD) 2011    CABG surgery in 2011  . Hyperlipidemia   . 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 Splenomegaly 01/23/2009    Additional GI history includes small bowel obstruction, requiring laparotomy and lysis of adhesions in 2009, remote peptic ulcer disease and postoperative perirectal infection  . ALCOHOL ABUSE, HX OF 01/23/2009  . Brain metastasis 07/02/2013  . Status post chemotherapy     Leucovorin/5-FU plus Avastin-discontinued due to Grade 4 Esohagitis  . S/P radiation therapy  07/23/2013    Right cerebellar  . On antineoplastic chemotherapy     Stivarga 80 mg 21 days on and 7 day respite     Past Surgical History  Procedure Laterality Date  . Irrigation and debridement sebaceous cyst    . Laparoscopic lysis intestinal adhesions  2009    With incidental appendectomy  . Coronary artery bypass graft  2011    2 vessel  . Abdominoperineal proctocolectomy  2008  . Portacath placement  2011  . Colostomy     Social History:  reports that he quit smoking about 4 years ago. He has never used smokeless tobacco. He reports that he does not drink alcohol or use illicit drugs.  Allergies  Allergen Reactions  . Erbitux [Cetuximab] Anaphylaxis    Family History  Problem Relation Age of Onset  . Cancer Mother   . Cancer Maternal Grandmother  Prior to Admission medications   Medication Sig Start Date End Date Taking? Authorizing Provider  dexamethasone (DECADRON) 4 MG tablet Take 8 mg by mouth 2 (two) times daily with a meal.   Yes Historical Provider, MD  dextromethorphan-guaiFENesin (MUCINEX DM) 30-600 MG per 12 hr tablet Take 1 tablet by mouth 2 (two) times daily as needed.   Yes Historical Provider, MD  Ipratropium-Albuterol (COMBIVENT) 20-100  MCG/ACT AERS respimat Inhale 1 puff into the lungs every 6 (six) hours. 10/18/13  Yes Farrel Gobble, MD  LORazepam (ATIVAN) 0.5 MG tablet Take 1 tablet (0.5 mg total) by mouth every 8 (eight) hours as needed (nausea/vomiting.  Take PO or SL.). 09/18/13  Yes Baird Cancer, PA-C  oxyCODONE (OXY IR/ROXICODONE) 5 MG immediate release tablet Take 1 or 2 tablets every 4 hours to control pain 10/18/13  Yes Farrel Gobble, MD  Rivaroxaban (XARELTO) 15 MG TABS tablet Take 1 tablet (15 mg total) by mouth 2 (two) times daily with a meal. 10/31/13  Yes Farrel Gobble, MD   Physical Exam: Filed Vitals:   11/07/13 1400 11/07/13 1430 11/07/13 1624 11/07/13 1720  BP: 115/78 123/94 123/90 118/84  Pulse: 103 102 102 102  Temp:    97.7 F (36.5 C)  TempSrc:    Oral  Resp: 21 20 21 22   Height:    6\' 1"  (1.854 m)  Weight:    74.4 kg (164 lb 0.4 oz)  SpO2: 97% 97% 98% 97%    Wt Readings from Last 3 Encounters:  11/07/13 74.4 kg (164 lb 0.4 oz)  10/24/13 72.439 kg (159 lb 11.2 oz)  10/22/13 73.528 kg (162 lb 1.6 oz)    General:  Appears clinically dehydrated. Flat affect. Eyes: PERRL, normal lids, irises & conjunctiva ENT: grossly normal hearing, lips & tongue Neck: no LAD, masses or thyromegaly Cardiovascular: RRR, no m/r/g. No LE edema. Telemetry: SR, no arrhythmias  Respiratory: Poor air entry on the left side with dullness to percussion, consistent with a pleural effusion. Abdomen: soft, ntnd Skin: no rash or induration seen on limited exam Musculoskeletal: grossly normal tone BUE/BLE Psychiatric: Depressed/flat affect. Neurologic: grossly non-focal.          Labs on Admission:  Basic Metabolic Panel:  Recent Labs Lab 11/07/13 1233  NA 137  K 3.7  CL 97  CO2 26  GLUCOSE 101*  BUN 14  CREATININE 0.97  CALCIUM 8.6   Liver Function Tests: No results found for this basename: AST, ALT, ALKPHOS, BILITOT, PROT, ALBUMIN,  in the last 168 hours No results found for this basename:  LIPASE, AMYLASE,  in the last 168 hours No results found for this basename: AMMONIA,  in the last 168 hours CBC:  Recent Labs Lab 11/07/13 1233  WBC 8.9  NEUTROABS 8.3*  HGB 12.2*  HCT 36.4*  MCV 93.1  PLT 194   Cardiac Enzymes:  Recent Labs Lab 11/07/13 1233  TROPONINI <0.30    BNP (last 3 results) No results found for this basename: PROBNP,  in the last 8760 hours CBG: No results found for this basename: GLUCAP,  in the last 168 hours  Radiological Exams on Admission: Ct Angio Chest W/cm &/or Wo Cm  11/07/2013   CLINICAL DATA:  SHORTNESS OF BREATH  EXAM: CT ANGIOGRAPHY CHEST WITH CONTRAST  TECHNIQUE: Multidetector CT imaging of the chest was performed using the standard protocol during bolus administration of intravenous contrast. Multiplanar CT image reconstructions and MIPs were obtained to evaluate the vascular anatomy.  CONTRAST:  128mL OMNIPAQUE IOHEXOL 350 MG/ML SOLN  COMPARISON:  10/31/2013  FINDINGS: Satisfactory opacification of pulmonary arteries noted, and there is no evidence of pulmonary emboli. Adequate contrast opacification of the thoracic aorta with no evidence of dissection, aneurysm, or stenosis. There is classic 3-vessel brachiocephalic arch anatomy without proximal stenosis. Patchy aortic calcifications. Previous CABG. Right subclavian port catheter to the proximal SVC.  Large left pleural effusion with pleural nodules. Confluent masses in the left upper lobe with left hilar adenopathy encasing and compressing the left pulmonary artery and its branches. Some increase in left lower lobe atelectasis/consolidation.  Multiple masses and nodules in the right lung, with a new small right pleural effusion. Bulky right hilar, sub carinal, left supraclavicular, will prevascular, and right paratracheal adenopathy as before. Trace pericardial effusion.  In the visualized upper abdomen, low-attenuation liver lesions again evident.  Review of the MIP images confirms the above  findings.  IMPRESSION: 1. Negative for acute PE or thoracic aortic dissection. 2. New small right pleural effusion. 3. Little interval change in multiple bilateral lung nodules and masses, bulky bilateral hilar and mediastinal adenopathy, large left pleural effusion with pleural masses, and liver lesions.   Electronically Signed   By: Arne Cleveland M.D.   On: 11/07/2013 15:14   Dg Chest Portable 1 View  11/07/2013   CLINICAL DATA:  Shortness of breath.  History of colon cancer.  EXAM: PORTABLE CHEST - 1 VIEW  COMPARISON:  Chest CT 10/31/2013  FINDINGS: There is decreased aeration throughout the left lung compared to the recent CT examination. Findings could be related to enlargement of the left pleural effusion or volume loss. Again noted are multiple lesions throughout the right lung. Port-A-Cath tip is in the SVC region. The heart is obscured by the left chest opacities.  IMPRESSION: Increased densities throughout the left chest with decreased aeration in the left lung. Findings may represent interval enlargement of the left pleural effusion versus volume loss.  Multiple nodular lesions throughout the right lung are consistent with known metastatic disease.   Electronically Signed   By: Markus Daft M.D.   On: 11/07/2013 13:26      Assessment/Plan   1. Hypoxia secondary to widespread mediastinal metastatic lymphadenopathy likely compressing major bronchus and left moderate to large pleural effusion. 2. Stage IV rectal adenocarcinoma with metastatic disease to the lung, mediastinum, brain and liver. Chemotherapy apparently discontinued now. Palliative radiotherapy. 3. COPD. 4. Palliative performance score 30% at best with declining functional status.  Plan: 1. Admit to medical floor. 2. Symptomatic relief with oxygen. 3. Start scheduled Roxanol for symptomatic relief of dyspnea. 4. IV fluids. 5. Organize home oxygen tomorrow morning.  Further recommendations will depend on patient's hospital  progress.   Code Status: Limited code. No intubation and mechanical ventilation.  DVT Prophylaxis: On Xarelto.  Family Communication: I discussed the plan with patient at the bedside. The patient is not quite ready for hospice care but I think is contemplating this.  Disposition Plan: Home when medically stable, probably tomorrow.   Time spent: 60 minutes.  Doree Albee [facility] Pager [phone].  **Disclaimer: This note may have been dictated with voice recognition software. Similar sounding words can inadvertently be transcribed and this note may contain transcription errors which may not have been corrected upon publication of note.**

## 2013-11-08 ENCOUNTER — Ambulatory Visit: Payer: Medicare Other

## 2013-11-08 DIAGNOSIS — C20 Malignant neoplasm of rectum: Secondary | ICD-10-CM

## 2013-11-08 DIAGNOSIS — J9600 Acute respiratory failure, unspecified whether with hypoxia or hypercapnia: Principal | ICD-10-CM

## 2013-11-08 DIAGNOSIS — I82C19 Acute embolism and thrombosis of unspecified internal jugular vein: Secondary | ICD-10-CM

## 2013-11-08 DIAGNOSIS — J90 Pleural effusion, not elsewhere classified: Secondary | ICD-10-CM

## 2013-11-08 DIAGNOSIS — C781 Secondary malignant neoplasm of mediastinum: Secondary | ICD-10-CM

## 2013-11-08 LAB — COMPREHENSIVE METABOLIC PANEL
ALT: 20 U/L (ref 0–53)
AST: 19 U/L (ref 0–37)
Albumin: 2.1 g/dL — ABNORMAL LOW (ref 3.5–5.2)
Alkaline Phosphatase: 148 U/L — ABNORMAL HIGH (ref 39–117)
Anion gap: 14 (ref 5–15)
BILIRUBIN TOTAL: 0.4 mg/dL (ref 0.3–1.2)
BUN: 13 mg/dL (ref 6–23)
CHLORIDE: 100 meq/L (ref 96–112)
CO2: 27 mEq/L (ref 19–32)
CREATININE: 0.94 mg/dL (ref 0.50–1.35)
Calcium: 8.5 mg/dL (ref 8.4–10.5)
GFR calc Af Amer: >90 mL/min (ref >90)
GLUCOSE: 90 mg/dL (ref 70–99)
Potassium: 4.2 mEq/L (ref 3.7–5.3)
Sodium: 141 mEq/L (ref 137–147)
Total Protein: 5.9 g/dL — ABNORMAL LOW (ref 6.0–8.3)

## 2013-11-08 LAB — CBC
HCT: 35.3 % — ABNORMAL LOW (ref 39.0–52.0)
Hemoglobin: 11.8 g/dL — ABNORMAL LOW (ref 13.0–17.0)
MCH: 31.4 pg (ref 26.0–34.0)
MCHC: 33.4 g/dL (ref 30.0–36.0)
MCV: 93.9 fL (ref 78.0–100.0)
PLATELETS: 212 10*3/uL (ref 150–400)
RBC: 3.76 MIL/uL — ABNORMAL LOW (ref 4.22–5.81)
RDW: 15.8 % — ABNORMAL HIGH (ref 11.5–15.5)
WBC: 8.6 10*3/uL (ref 4.0–10.5)

## 2013-11-08 MED ORDER — MORPHINE SULFATE (CONCENTRATE) 10 MG /0.5 ML PO SOLN: 5.0000 mg | ORAL | Status: AC | PRN

## 2013-11-08 MED ORDER — HEPARIN SOD (PORK) LOCK FLUSH 100 UNIT/ML IV SOLN
500.0000 [IU] | INTRAVENOUS | Status: DC | PRN
  Filled 2013-11-08: qty 5

## 2013-11-08 MED ORDER — HEPARIN SOD (PORK) LOCK FLUSH 100 UNIT/ML IV SOLN: 500.0000 [IU] | INTRAVENOUS | Status: DC | PRN

## 2013-11-08 NOTE — Clinical Social Work Note (Signed)
CSW received consult for home health services. Notified CM and will sign off. Please reconsult if needed.  Benay Pike, [place]

## 2013-11-08 NOTE — Care Management Note (Signed)
UR completed 

## 2013-11-08 NOTE — Consult Note (Signed)
[facility] Consultation Oncology  Name: Martin Maldonado      MRN: [id number]    Location: J628/Z662-94  Date: 11/08/2013 Time:11:13 AM   REFERRING PHYSICIAN:  Murray Hodgkins, MD  REASON FOR CONSULT:  Metastatic Rectal Cancer   DIAGNOSIS:  Stage IV metastatic rectal cancer x 9 years.  HISTORY OF PRESENT ILLNESS:   Martin Maldonado is a 58 year old white man who is very well-known to the [facility] where he has been receiving treatment for metastatic rectal cancer.  Recently, he demonstrated significant progression of disease while on Stivarga salvage therapy.  He was urgently worked-in by [employer], Dr. Isidore Moos, who began radiation treatment to his mediastinum to help with his breathing.  This was complicated by a jugular vein thrombosis for which he is on loading dose of Xarelto.  He was admitted to the [facility] with hypoxia on exertion.  Martin Maldonado is seen in his hospital room with his sister, Coralyn Mark, and his son, Merrily Pew.  I have reviewed the patient's case with the family in the presence of Ramond.  He understands that he was undergoing palliative radiation and his cancer is progressing significantly.  He knows he is incurable and his prognosis is guarded.  The role of radiation was to help symptomatically.    I personally reviewed and went over laboratory results with the patient.  The results are noted within this dictation.  I personally reviewed and went over radiographic studies with the patient.  The results are noted within this dictation.  CT angio of chest demonstrates stable left sided pleural effusion that is large.    We discussed options: 1. Thoracentesis- this may help symptomatically, but does not change his outcome. 2. Radiation therapy- continuing radiation is palliative and to help symptomatically.  Radiation is becoming very difficult for him to go to and he has missed appointments. 3. Transition to comfort measures.  Regarding radiation, he states "I don't  want any more."  He does not want to pursue more radiation. As a result, he has decided not to pursue a thoracentesis.  "I have had enough."  As a result, I broached the topic of Hospice.  We had a long conversation regarding Hospice vs treatment.  Patient education was given regarding Hospice and the services they provide.  Hospice will allow the patient to stay at home at end of life or go to a facility for end of life care.  At this point, the patient would like to go home.  Hospice provides the patient with a team of providers to help with care including physicians, nurses, aids, chaplains, and social workers.  Hospice's goal is to focus on comfort care.  The patient is certainly Hospice appropriate with a life expectancy of days to weeks.  The patient has declined active therapy at this time.  He wants to consider this option.  His sister is agreeable to Hospice, but the patient wants to "think about it for as day or two."  He will call us when he is ready fro Hospice referral.   PAST MEDICAL HISTORY:   Past Medical History  Diagnosis Date  . Hypertension   . Rectal cancer 2008    Adenocarcinoma; AP resection in 2008; recurrence with lung metastases in 2011  . Arteriosclerotic cardiovascular disease (ASCVD) 2011    CABG surgery in 2011  . Hyperlipidemia   . 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 Splenomegaly 01/23/2009  Additional GI history includes small bowel obstruction, requiring laparotomy and lysis of adhesions in 2009, remote peptic ulcer disease and postoperative perirectal infection  . ALCOHOL ABUSE, HX OF 01/23/2009  . Brain metastasis 07/02/2013  . Status post chemotherapy     Leucovorin/5-FU plus Avastin-discontinued due to Grade 4 Esohagitis  . S/P radiation therapy  07/23/2013    Right cerebellar  . On antineoplastic chemotherapy     Stivarga 80 mg 21 days on and 7 day respite      ALLERGIES: Allergies  Allergen  Reactions  . Erbitux [Cetuximab] Anaphylaxis      MEDICATIONS: I have reviewed the patient's current medications.     PAST SURGICAL HISTORY Past Surgical History  Procedure Laterality Date  . Irrigation and debridement sebaceous cyst    . Laparoscopic lysis intestinal adhesions  2009    With incidental appendectomy  . Coronary artery bypass graft  2011    2 vessel  . Abdominoperineal proctocolectomy  2008  . Portacath placement  2011  . Colostomy      FAMILY HISTORY: Family History  Problem Relation Age of Onset  . Cancer Mother   . Cancer Maternal Grandmother     SOCIAL HISTORY:  reports that he quit smoking about 4 years ago. He has never used smokeless tobacco. He reports that he does not drink alcohol or use illicit drugs.  PERFORMANCE STATUS: The patient's performance status is 3 - Symptomatic, >50% confined to bed  PHYSICAL EXAM: Most Recent Vital Signs: Blood pressure 131/89, pulse 95, temperature 97.7 F (36.5 C), temperature source Oral, resp. rate 22, height $RemoveBe'6\' 1"'CQyncBivF$  (1.854 m), weight 164 lb 0.4 oz (74.4 kg), SpO2 95.00%. General appearance: alert, cooperative, moderate distress and ill looking Head: Normocephalic, without obvious abnormality, atraumatic Eyes: negative findings: lids and lashes normal, conjunctivae and sclerae normal and corneas clear Neck: thickened due to jugular DVT Chest wall: erythematous Abdomen: normal findings: soft, non-tender Skin: Skin color, texture, turgor normal. No rashes or lesions Neurologic: Grossly normal  LABORATORY DATA:  Results for orders placed during the hospital encounter of 11/07/13 (from the past 48 hour(s))  CBC WITH DIFFERENTIAL     Status: Abnormal   Collection Time    11/07/13 12:33 PM      Result Value Ref Range   WBC 8.9  4.0 - 10.5 K/uL   RBC 3.91 (*) 4.22 - 5.81 MIL/uL   Hemoglobin 12.2 (*) 13.0 - 17.0 g/dL   HCT 36.4 (*) 39.0 - 52.0 %   MCV 93.1  78.0 - 100.0 fL   MCH 31.2  26.0 - 34.0 pg   MCHC 33.5   30.0 - 36.0 g/dL   RDW 15.8 (*) 11.5 - 15.5 %   Platelets 194  150 - 400 K/uL   Neutrophils Relative % 93 (*) 43 - 77 %   Neutro Abs 8.3 (*) 1.7 - 7.7 K/uL   Lymphocytes Relative 5 (*) 12 - 46 %   Lymphs Abs 0.4 (*) 0.7 - 4.0 K/uL   Monocytes Relative 2 (*) 3 - 12 %   Monocytes Absolute 0.2  0.1 - 1.0 K/uL   Eosinophils Relative 0  0 - 5 %   Eosinophils Absolute 0.0  0.0 - 0.7 K/uL   Basophils Relative 0  0 - 1 %   Basophils Absolute 0.0  0.0 - 0.1 K/uL  BASIC METABOLIC PANEL     Status: Abnormal   Collection Time    11/07/13 12:33 PM  Result Value Ref Range   Sodium 137  137 - 147 mEq/L   Potassium 3.7  3.7 - 5.3 mEq/L   Chloride 97  96 - 112 mEq/L   CO2 26  19 - 32 mEq/L   Glucose, Bld 101 (*) 70 - 99 mg/dL   BUN 14  6 - 23 mg/dL   Creatinine, Ser 0.97  0.50 - 1.35 mg/dL   Calcium 8.6  8.4 - 10.5 mg/dL   GFR calc non Af Amer >90  >90 mL/min   GFR calc Af Amer >90  >90 mL/min   Comment: (NOTE)     The eGFR has been calculated using the CKD EPI equation.     This calculation has not been validated in all clinical situations.     eGFR's persistently <90 mL/min signify possible Chronic Kidney     Disease.   Anion gap 14  5 - 15  TROPONIN I     Status: None   Collection Time    11/07/13 12:33 PM      Result Value Ref Range   Troponin I <0.30  <0.30 ng/mL   Comment:            Due to the release kinetics of cTnI,     a negative result within the first hours     of the onset of symptoms does not rule out     myocardial infarction with certainty.     If myocardial infarction is still suspected,     repeat the test at appropriate intervals.  COMPREHENSIVE METABOLIC PANEL     Status: Abnormal   Collection Time    11/08/13  5:45 AM      Result Value Ref Range   Sodium 141  137 - 147 mEq/L   Potassium 4.2  3.7 - 5.3 mEq/L   Chloride 100  96 - 112 mEq/L   CO2 27  19 - 32 mEq/L   Glucose, Bld 90  70 - 99 mg/dL   BUN 13  6 - 23 mg/dL   Creatinine, Ser 0.94  0.50 - 1.35  mg/dL   Calcium 8.5  8.4 - 10.5 mg/dL   Total Protein 5.9 (*) 6.0 - 8.3 g/dL   Albumin 2.1 (*) 3.5 - 5.2 g/dL   AST 19  0 - 37 U/L   ALT 20  0 - 53 U/L   Alkaline Phosphatase 148 (*) 39 - 117 U/L   Total Bilirubin 0.4  0.3 - 1.2 mg/dL   GFR calc non Af Amer >90  >90 mL/min   GFR calc Af Amer >90  >90 mL/min   Comment: (NOTE)     The eGFR has been calculated using the CKD EPI equation.     This calculation has not been validated in all clinical situations.     eGFR's persistently <90 mL/min signify possible Chronic Kidney     Disease.   Anion gap 14  5 - 15  CBC     Status: Abnormal   Collection Time    11/08/13  5:45 AM      Result Value Ref Range   WBC 8.6  4.0 - 10.5 K/uL   RBC 3.76 (*) 4.22 - 5.81 MIL/uL   Hemoglobin 11.8 (*) 13.0 - 17.0 g/dL   HCT 35.3 (*) 39.0 - 52.0 %   MCV 93.9  78.0 - 100.0 fL   MCH 31.4  26.0 - 34.0 pg   MCHC 33.4  30.0 - 36.0 g/dL  RDW 15.8 (*) 11.5 - 15.5 %   Platelets 212  150 - 400 K/uL      RADIOGRAPHY: Ct Angio Chest W/cm &/or Wo Cm  11/07/2013   CLINICAL DATA:  SHORTNESS OF BREATH  EXAM: CT ANGIOGRAPHY CHEST WITH CONTRAST  TECHNIQUE: Multidetector CT imaging of the chest was performed using the standard protocol during bolus administration of intravenous contrast. Multiplanar CT image reconstructions and MIPs were obtained to evaluate the vascular anatomy.  CONTRAST:  OMNIPAQUE IOHEXOL 350 MG/ML SOLN  COMPARISON:  10/31/2013  FINDINGS: Satisfactory opacification of pulmonary arteries noted, and there is no evidence of pulmonary emboli. Adequate contrast opacification of the thoracic aorta with no evidence of dissection, aneurysm, or stenosis. There is classic 3-vessel brachiocephalic arch anatomy without proximal stenosis. Patchy aortic calcifications. Previous CABG. Right subclavian port catheter to the proximal SVC.  Large left pleural effusion with pleural nodules. Confluent masses in the left upper lobe with left hilar adenopathy encasing  and compressing the left pulmonary artery and its branches. Some increase in left lower lobe atelectasis/consolidation.  Multiple masses and nodules in the right lung, with a new small right pleural effusion. Bulky right hilar, sub carinal, left supraclavicular, will prevascular, and right paratracheal adenopathy as before. Trace pericardial effusion.  In the visualized upper abdomen, low-attenuation liver lesions again evident.  Review of the MIP images confirms the above findings.  IMPRESSION: 1. Negative for acute PE or thoracic aortic dissection. 2. New small right pleural effusion. 3. Little interval change in multiple bilateral lung nodules and masses, bulky bilateral hilar and mediastinal adenopathy, large left pleural effusion with pleural masses, and liver lesions.   Electronically Signed   By: Oley Balm M.D.   On: 11/07/2013 15:14   Dg Chest Portable 1 View  11/07/2013   CLINICAL DATA:  Shortness of breath.  History of colon cancer.  EXAM: PORTABLE CHEST - 1 VIEW  COMPARISON:  Chest CT 10/31/2013  FINDINGS: There is decreased aeration throughout the left lung compared to the recent CT examination. Findings could be related to enlargement of the left pleural effusion or volume loss. Again noted are multiple lesions throughout the right lung. Port-A-Cath tip is in the SVC region. The heart is obscured by the left chest opacities.  IMPRESSION: Increased densities throughout the left chest with decreased aeration in the left lung. Findings may represent interval enlargement of the left pleural effusion versus volume loss.  Multiple nodular lesions throughout the right lung are consistent with known metastatic disease.   Electronically Signed   By: Richarda Overlie M.D.   On: 11/07/2013 13:26       PATHOLOGY:  Nothing new   ASSESSMENT:  1. Stage IV metastatic rectal cancer, on palliative radiation to mediastinum but unable to make recent appointments due to failure to thrive.  Progression of disease is  noted.  Prognosis is poor 2. Jugular DVT, on Xarelto loading dose 3. SOB and dyspnea secondary to #1 and pleural effusion 4. Large left pleural effusion 5. Failure to thrive  Patient Active Problem List   Diagnosis Date Noted  . Acute respiratory failure with hypoxia 11/07/2013  . Pleural effusion 11/07/2013  . 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 Brain metastasis 07/02/2013  . CAD (coronary artery disease) of artery bypass graft 02/12/2013  . Rectal cancer 09/22/2010  . HYPERLIPIDEMIA 11/06/2009  . Tobacco abuse, in remission 09/02/2009  . COPD 01/23/2009  . SPLENOMEGALY 01/23/2009  . SMALL BOWEL OBSTRUCTION, HX OF 01/23/2009  .  HYPERTENSION 01/22/2009     PLAN:  1. I personally reviewed and went over laboratory results with the patient.  The results are noted within this dictation. 2. I personally reviewed and went over radiographic studies with the patient.  The results are noted within this dictation.   3. Chart reviewed 4. Broached the topic of Hospice.  Patient will consider this option 5. Patient declines further therapy.  Will inform Rad Onc.   6. Recommend continuing Xarelto loading dose as an outpatient for symptom management 7. Recommend home oxygen, if he qualifies 8. Recommend liquid morphine at home for pain and SOB 9. Recommend Hospice, patient will consider this option. 10. Follow-up at [facility].  Patient/sister will let us know in the next few days about Hospice referral.  All questions were answered. The patient knows to call the clinic with any problems, questions or concerns. We can certainly see the patient much sooner if necessary.  Patient and plan discussed with Dr. Farrel Gobble and he is in agreement with the aforementioned.   Josalynn Johndrow 11/08/2013

## 2013-11-08 NOTE — Progress Notes (Addendum)
PROGRESS NOTE  Martin Maldonado ZOX:[id number] DOB: Mar 11, 1956 DOA: 11/07/2013 PCP: [name], MD  Adenddum: Discussed with   Summary: 58 year old man with history of stage IV rectal cancer with metastatic disease including lung presented with increasing shortness of breath. Found to be hypoxic and referred for admission.  Assessment/Plan: 1. Acute hypoxic respiratory failure, likely multifactorial including metastatic disease with possible compression of major bronchus as well as moderate to large left pleural effusion and COPD. 2. Large left pleural effusion. Chronic since April. Unclear whether symptomatic at this point. Long discussion with patient at bedside discussing risk/benefit of thoracentesis. I showed him CXR from this admission. Thoracentesis was recommended but he wishes to defer at this point and discuss with oncology.  3. Stage IV rectal cancer with with liver, abdominal lymph node, lung, and brain metastases. Undergoing palliative radiation.  4. Jugular venous thrombosis recently diagnosed. Continue Xarelto. 5. Tobacco dependence in remission    Plan home oxygen if qualifies. Further evaluation and recommendations by oncology later today. Patient was offered thoracentesis but wishes to defer at this time.  Discussed in detail with the patient's son and sister at bedside.  Possibly home later today.  Code Status: partial; no intubation DVT prophylaxis: Xarelto Family Communication:  Disposition Plan:   Murray Hodgkins, MD  [facility]  Pager [phone] If 7PM-7AM, please contact night-coverage at [email or web address], password [facility] 11/08/2013, 8:49 AM  LOS: 1 day   Consultants:  Oncology   Procedures:   Antibiotics:    HPI/Subjective: He feels about the same today. Somewhat short of breath.  Objective: Filed Vitals:   11/07/13 2118 11/08/13 0222 11/08/13 0542 11/08/13 0640  BP: 96/46  131/89   Pulse: 101  95   Temp: 98.1 F (36.7 C)  97.7 F (36.5 C)    TempSrc: Oral  Oral   Resp: 22  22   Height:      Weight:      SpO2: 100% 96% 99% 95%    Intake/Output Summary (Last 24 hours) at 11/08/13 0849 Last data filed at 11/07/13 1905  Gross per 24 hour  Intake      0 ml  Output    450 ml  Net   -450 ml     Filed Weights   11/07/13 1241 11/07/13 1251 11/07/13 1720  Weight: 72.576 kg (160 lb) 72.576 kg (160 lb) 74.4 kg (164 lb 0.4 oz)    Exam:     Afebrile, vitals are stable. Stable hypoxia on 2 L. Gen. Appears calm, chronically ill, nontoxic.  Psych. Alert. Speech fluent and clear.  Cardiovascular. Regular rate and rhythm. No murmur, rub or gallop.  Respiratory. Minimal breath sounds on the left. Fair air movement on the right. No frank wheezes, rales or rhonchi. Mild increased respiratory effort.  Data Reviewed:  Chemistry: Complete metabolic panel unremarkable  Heme: CBC unremarkable  Imaging: Chest x-ray independently reviewed shows large left pleural effusion. CT angiogram of the chest negative for PE. Multiple bilateral lung nodules or masses noted.  Scheduled Meds: . antiseptic oral rinse  7 mL Mouth Rinse BID  . dexamethasone  8 mg Oral BID WC  . ipratropium-albuterol  3 mL Nebulization Q6H  . morphine CONCENTRATE  5 mg Oral 4 times per day  . Rivaroxaban  15 mg Oral BID WC   Continuous Infusions: . sodium chloride 75 mL/hr at 11/07/13 1856    Principal Problem:   Acute respiratory failure with hypoxia Active Problems:   Tobacco abuse, in remission   COPD  Rectal cancer   Malignant neoplasm of thorax (metastases)   Pleural effusion   Time spent 35 minutes, greater than 50% in counseling and coordination of care

## 2013-11-08 NOTE — Discharge Summary (Addendum)
Physician Discharge Summary  Martin Maldonado EVO:[id number] DOB: Dec 25, 1955 DOA: 11/07/2013  PCP: [name], MD  Admit date: 11/07/2013 Discharge date: 11/08/2013  Recommendations for Outpatient Follow-up:  1. Hypoxic respiratory failure, started on oxygen nasal cannula 2 L continuous. 2. Large left pleural effusion. Patient has declined thoracentesis at this time. 3. Consider hospice.    Follow-up Information   Follow up with [facility], [place]. (As needed)    Specialty:  Internal Medicine   Contact information:   [name] [address] [phone]       Follow up with KEFALAS,THOMAS, PA-C. (As needed)    Specialty:  Physician Assistant   Contact information:   [place] [address] [phone]      Discharge Diagnoses:  1. Acute hypoxic respiratory failure secondary metastatic disease 2. Large left pleural effusion 3. 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 Recently diagnosed jugular venous thrombosis  Discharge Condition: Improved Disposition: Home with home oxygen  Diet recommendation: Regular  Filed Weights   11/07/13 1241 11/07/13 1251 11/07/13 1720  Weight: 72.576 kg (160 lb) 72.576 kg (160 lb) 74.4 kg (164 lb 0.4 oz)    History of present illness:  58 year old man with history of stage IV rectal cancer with metastatic disease including lung presented with increasing shortness of breath. Found to be hypoxic and referred for admission.  Hospital Course:  Further evaluation with CT of the chest again demonstrated known metastatic disease which is felt to be the primary etiology for his hypoxic respiratory failure. Also seen again with large left pleural effusion. This was reviewed with patient and family in detail and thoracentesis was offered but the patient has declined this. He was seen by oncology in consultation and has elected to discharge home and declines further treatment including radiation at this time. He is no longer a  candidate for chemotherapy. Hospice was offered but patient declined at this point. Plan discharge home with oral morphine to assist with symptom control.  1. Acute hypoxic respiratory failure, likely multifactorial including metastatic disease with possible compression of major bronchus as well as moderate to large left pleural effusion and COPD. 2. Large left pleural effusion. Chronic since April. Unclear whether symptomatic at this point. Long discussion with patient at bedside discussing risk/benefit of thoracentesis. I showed him CXR from this admission. Thoracentesis was recommended but he declined. 3. Stage IV rectal cancer with with liver, abdominal lymph node, lung, and brain metastases. Declines further palliative radiation.  4. Jugular venous thrombosis recently diagnosed. Continue Xarelto. 5. Tobacco dependence in remission   Consultants:  Oncology  Procedures: None  Discharge Instructions  Discharge Instructions   Activity as tolerated - No restrictions    Complete by:  As directed      Diet general    Complete by:  As directed      Discharge instructions    Complete by:  As directed   Call your physician or seek any medical assistance for uncontrolled pain, difficulty breathing or worsening of condition. You have been started on liquid morphine for pain.            Medication List    STOP taking these medications       oxyCODONE 5 MG immediate release tablet  Commonly known as:  Oxy IR/ROXICODONE      TAKE these medications       dexamethasone 4 MG tablet  Commonly known as:  DECADRON  Take 8 mg by mouth 2 (two)  times daily with a meal.     dextromethorphan-guaiFENesin 30-600 MG per 12 hr tablet  Commonly known as:  MUCINEX DM  Take 1 tablet by mouth 2 (two) times daily as needed.     Ipratropium-Albuterol 20-100 MCG/ACT Aers respimat  Commonly known as:  COMBIVENT  Inhale 1 puff into the lungs every 6 (six) hours.     LORazepam 0.5 MG tablet  Commonly  known as:  ATIVAN  Take 1 tablet (0.5 mg total) by mouth every 8 (eight) hours as needed (nausea/vomiting.  Take PO or SL.).     morphine CONCENTRATE 10 mg / 0.5 ml concentrated solution  Take 0.25 mLs (5 mg total) by mouth every 4 (four) hours as needed for severe pain.     Rivaroxaban 15 MG Tabs tablet  Commonly known as:  XARELTO  Take 1 tablet (15 mg total) by mouth 2 (two) times daily with a meal.       Allergies  Allergen Reactions  . Erbitux [Cetuximab] Anaphylaxis    The results of significant diagnostics from this hospitalization (including imaging, microbiology, ancillary and laboratory) are listed below for reference.    Significant Diagnostic Studies: Ct Angio Chest W/cm &/or Wo Cm  11/07/2013   CLINICAL DATA:  SHORTNESS OF BREATH  EXAM: CT ANGIOGRAPHY CHEST WITH CONTRAST  TECHNIQUE: Multidetector CT imaging of the chest was performed using the standard protocol during bolus administration of intravenous contrast. Multiplanar CT image reconstructions and MIPs were obtained to evaluate the vascular anatomy.  CONTRAST:  148mL OMNIPAQUE IOHEXOL 350 MG/ML SOLN  COMPARISON:  10/31/2013  FINDINGS: Satisfactory opacification of pulmonary arteries noted, and there is no evidence of pulmonary emboli. Adequate contrast opacification of the thoracic aorta with no evidence of dissection, aneurysm, or stenosis. There is classic 3-vessel brachiocephalic arch anatomy without proximal stenosis. Patchy aortic calcifications. Previous CABG. Right subclavian port catheter to the proximal SVC.  Large left pleural effusion with pleural nodules. Confluent masses in the left upper lobe with left hilar adenopathy encasing and compressing the left pulmonary artery and its branches. Some increase in left lower lobe atelectasis/consolidation.  Multiple masses and nodules in the right lung, with a new small right pleural effusion. Bulky right hilar, sub carinal, left supraclavicular, will prevascular, and right  paratracheal adenopathy as before. Trace pericardial effusion.  In the visualized upper abdomen, low-attenuation liver lesions again evident.  Review of the MIP images confirms the above findings.  IMPRESSION: 1. Negative for acute PE or thoracic aortic dissection. 2. New small right pleural effusion. 3. Little interval change in multiple bilateral lung nodules and masses, bulky bilateral hilar and mediastinal adenopathy, large left pleural effusion with pleural masses, and liver lesions.   Electronically Signed   By: Arne Cleveland M.D.   On: 11/07/2013 15:14   Dg Chest Portable 1 View  11/07/2013   CLINICAL DATA:  Shortness of breath.  History of colon cancer.  EXAM: PORTABLE CHEST - 1 VIEW  COMPARISON:  Chest CT 10/31/2013  FINDINGS: There is decreased aeration throughout the left lung compared to the recent CT examination. Findings could be related to enlargement of the left pleural effusion or volume loss. Again noted are multiple lesions throughout the right lung. Port-A-Cath tip is in the SVC region. The heart is obscured by the left chest opacities.  IMPRESSION: Increased densities throughout the left chest with decreased aeration in the left lung. Findings may represent interval enlargement of the left pleural effusion versus volume loss.  Multiple nodular lesions  throughout the right lung are consistent with known metastatic disease.   Electronically Signed   By: Markus Daft M.D.   On: 11/07/2013 13:26    Labs: Basic Metabolic Panel:  Recent Labs Lab 11/07/13 1233 11/08/13 0545  NA 137 141  K 3.7 4.2  CL 97 100  CO2 26 27  GLUCOSE 101* 90  BUN 14 13  CREATININE 0.97 0.94  CALCIUM 8.6 8.5   Liver Function Tests:  Recent Labs Lab 11/08/13 0545  AST 19  ALT 20  ALKPHOS 148*  BILITOT 0.4  PROT 5.9*  ALBUMIN 2.1*   CBC:  Recent Labs Lab 11/07/13 1233 11/08/13 0545  WBC 8.9 8.6  NEUTROABS 8.3*  --   HGB 12.2* 11.8*  HCT 36.4* 35.3*  MCV 93.1 93.9  PLT 194 212    Cardiac Enzymes:  Recent Labs Lab 11/07/13 1233  TROPONINI <0.30    Principal Problem:   Acute respiratory failure with hypoxia Active Problems:   Tobacco abuse, in remission   COPD   Rectal cancer   Malignant neoplasm of thorax (metastases)   Pleural effusion   Time coordinating discharge: 35 minutes  Signed:  Murray Hodgkins, MD [facility] 11/08/2013, 1:44 PM

## 2013-11-08 NOTE — Progress Notes (Signed)
O2 sats at rest on RA 92% O2 sats ambulating on RA 88% O2 sats ambulating on O2 2L [place] 94%

## 2013-11-08 NOTE — Care Management Note (Signed)
    Page 1 of 1   11/08/2013     11:18:07 AM CARE MANAGEMENT NOTE 11/08/2013  Patient:  Martin Maldonado, Martin Maldonado   Account Number:  [id number]  Date Initiated:  11/08/2013  Documentation initiated by:  CHILDRESS,JESSICA  Subjective/Objective Assessment:   Patient admited with SOB. Pt is currenlty recieving tx for stave IV rectal ca. Pt is from home alone. Pt independent with ADL's, has a cane and walker that he doesn't need at this point. Pt has no HH services prior to admission.     Action/Plan:   Pt plans to discharge home today with self care. Pt will need home O2 when discharge. Emma, with [facility] has been notified (per pt's request to use [facility]) and oxygen will be delivered. Patient will have no further CM needs prior to discharge.   Anticipated DC Date:  11/08/2013   Anticipated DC Plan:  [place]  CM consult      PAC Choice  DURABLE MEDICAL EQUIPMENT   Choice offered to / List presented to:  C-1 Patient   DME arranged  [facility]      DME agency  [facility].        Status of service:  Completed, signed off Medicare Important Message given?   (If response is "NO", the following Medicare IM given date fields will be blank) Date Medicare IM given:   Medicare IM given by:   Date Additional Medicare IM given:   Additional Medicare IM given by:    Discharge Disposition:  HOME/SELF CARE  Per UR Regulation:    If discussed at Long Length of Stay Meetings, dates discussed:    Comments:  11/08/2013 [place], RN, MSN, [facility]

## 2013-11-08 NOTE — Progress Notes (Signed)
AVS reviewed with patient and prescription provided to patient by J. Coffey, Therapist, sports.  Patient's port flushed and deaccessed prior to discharge.  Patient transported by NT via w/c to main entrance for discharge.  Home oxygen tank delivered by [facility] for patient transport home.  Patient stable at time of discharge.

## 2013-11-09 ENCOUNTER — Ambulatory Visit: Payer: Medicare Other

## 2013-11-12 ENCOUNTER — Ambulatory Visit: Payer: Medicare Other

## 2013-11-13 ENCOUNTER — Ambulatory Visit: Payer: Medicare Other

## 2013-11-13 NOTE — Progress Notes (Signed)
  Radiation Oncology         [phone] ________________________________  Name: Martin Maldonado MRN: [id number]  Date: 11/02/2013  DOB: 11/30/1955  End of Treatment Note  Diagnosis:   Brain metastases, rectal cancer     Indication for treatment:  palliative       Radiation treatment dates:  10/30/2013      Site/dose/Beams/energy:  [name] received stereotactic radiosurgery to the following targets:  Right frontal 68mm target was treated using 3 Circular Arcs to a prescription dose of 20 Gy.   Right occipital 69mm target was treated using 3 Circular Arcs to a prescription dose of 20 Gy.   6 MV FFF photons were used.  Narrative: The patient tolerated radiation treatment relatively well.     Plan: The patient has completed radiation treatment. Moving up CT of chest to tomorrow AM as he is SOB and chest is cyanotic when he lies down. Suspect progression of tumor, want to r/o SVC compression. Follow-up in one month, sooner if needed.  -----------------------------------  Eppie Gibson, MD

## 2013-11-14 ENCOUNTER — Ambulatory Visit: Payer: Medicare Other

## 2013-11-20 DEATH — deceased

## 2013-11-21 ENCOUNTER — Other Ambulatory Visit (HOSPITAL_COMMUNITY): Payer: Medicare Other

## 2013-11-21 ENCOUNTER — Ambulatory Visit (HOSPITAL_COMMUNITY): Payer: Medicare Other

## 2013-11-29 ENCOUNTER — Encounter (HOSPITAL_COMMUNITY): Payer: Medicare Other

## 2013-11-30 NOTE — Progress Notes (Signed)
[facility] End of Treatment Note  Name:Mavrick L Seefeld  Date: 11/02/2013 ERX:[id number] DOB:[birth year]    DIAGNOSIS: lung metastases, rectal cancer    INDICATION FOR TREATMENT: Palliative   TREATMENT DATES:  8/13 and 11/02/13                         SITE/DOSE:    Bilateral mediastinum, hilum and bulky lung tumors / 20 Gy in 5 fractions were planned; he only received 10 Gy in 2 fractions and then declined further RT                       BEAMS/ENERGY:     AP/PA / 3D conformal, 15 MV       NARRATIVE:       Mr Antenucci only received 10 Gy in 2 fractions and then declined further RT.  Nursing discussed the possibility of Hospice with his family and explained that they can deliver medical care to assist in easing his pain and provide support of his respiratory status.                      PLAN: Routine followup in one month. Patient instructed to call if questions or worsening complaints in interim.   -----------------------------------  Eppie Gibson, MD

## 2013-12-05 ENCOUNTER — Ambulatory Visit: Admission: RE | Admit: 2013-12-05 | Payer: Medicare Other | Source: Ambulatory Visit | Admitting: Radiation Oncology

## 2014-01-10 ENCOUNTER — Other Ambulatory Visit (HOSPITAL_COMMUNITY): Payer: Self-pay | Admitting: Hematology and Oncology

## 2015-09-05 ENCOUNTER — Other Ambulatory Visit: Payer: Self-pay | Admitting: Nurse Practitioner
